# Patient Record
Sex: Female | Born: 1960 | Race: Black or African American | Hispanic: No | Marital: Married | Smoking: Former smoker
Health system: Southern US, Community
[De-identification: ages and names within clinical notes are randomized; demographics above are authoritative.]

## PROBLEM LIST (undated history)

## (undated) DIAGNOSIS — R002 Palpitations: Secondary | ICD-10-CM

## (undated) DIAGNOSIS — I10 Essential (primary) hypertension: Secondary | ICD-10-CM

## (undated) DIAGNOSIS — E785 Hyperlipidemia, unspecified: Secondary | ICD-10-CM

## (undated) DIAGNOSIS — R600 Localized edema: Secondary | ICD-10-CM

## (undated) DIAGNOSIS — M1990 Unspecified osteoarthritis, unspecified site: Secondary | ICD-10-CM

## (undated) DIAGNOSIS — R079 Chest pain, unspecified: Secondary | ICD-10-CM

## (undated) DIAGNOSIS — R12 Heartburn: Secondary | ICD-10-CM

## (undated) DIAGNOSIS — R7303 Prediabetes: Secondary | ICD-10-CM

## (undated) DIAGNOSIS — M549 Dorsalgia, unspecified: Secondary | ICD-10-CM

## (undated) DIAGNOSIS — R1310 Dysphagia, unspecified: Secondary | ICD-10-CM

## (undated) DIAGNOSIS — E559 Vitamin D deficiency, unspecified: Secondary | ICD-10-CM

## (undated) DIAGNOSIS — K829 Disease of gallbladder, unspecified: Secondary | ICD-10-CM

## (undated) DIAGNOSIS — R0602 Shortness of breath: Secondary | ICD-10-CM

## (undated) DIAGNOSIS — D649 Anemia, unspecified: Secondary | ICD-10-CM

## (undated) DIAGNOSIS — E669 Obesity, unspecified: Secondary | ICD-10-CM

## (undated) DIAGNOSIS — K219 Gastro-esophageal reflux disease without esophagitis: Secondary | ICD-10-CM

## (undated) DIAGNOSIS — E119 Type 2 diabetes mellitus without complications: Secondary | ICD-10-CM

## (undated) DIAGNOSIS — Z86718 Personal history of other venous thrombosis and embolism: Secondary | ICD-10-CM

## (undated) DIAGNOSIS — M7731 Calcaneal spur, right foot: Secondary | ICD-10-CM

## (undated) DIAGNOSIS — M109 Gout, unspecified: Secondary | ICD-10-CM

## (undated) HISTORY — DX: Gout, unspecified: M10.9

## (undated) HISTORY — DX: Type 2 diabetes mellitus without complications: E11.9

## (undated) HISTORY — DX: Hyperlipidemia, unspecified: E78.5

## (undated) HISTORY — DX: Heartburn: R12

## (undated) HISTORY — PX: ORIF TIBIA & FIBULA FRACTURES: SHX2131

## (undated) HISTORY — DX: Shortness of breath: R06.02

## (undated) HISTORY — DX: Calcaneal spur, right foot: M77.31

## (undated) HISTORY — PX: DENTAL SURGERY: SHX609

## (undated) HISTORY — DX: Prediabetes: R73.03

## (undated) HISTORY — DX: Personal history of other venous thrombosis and embolism: Z86.718

## (undated) HISTORY — DX: Gastro-esophageal reflux disease without esophagitis: K21.9

## (undated) HISTORY — DX: Disease of gallbladder, unspecified: K82.9

## (undated) HISTORY — DX: Dysphagia, unspecified: R13.10

## (undated) HISTORY — PX: ABDOMINAL HYSTERECTOMY: SHX81

## (undated) HISTORY — DX: Dorsalgia, unspecified: M54.9

## (undated) HISTORY — DX: Chest pain, unspecified: R07.9

## (undated) HISTORY — DX: Unspecified osteoarthritis, unspecified site: M19.90

## (undated) HISTORY — DX: Palpitations: R00.2

## (undated) HISTORY — DX: Vitamin D deficiency, unspecified: E55.9

## (undated) HISTORY — DX: Localized edema: R60.0

## (undated) HISTORY — PX: CHOLECYSTECTOMY: SHX55

---

## 1999-10-18 ENCOUNTER — Ambulatory Visit (HOSPITAL_COMMUNITY): Admission: RE | Admit: 1999-10-18 | Discharge: 1999-10-18 | Payer: Self-pay | Admitting: Family Medicine

## 1999-10-18 ENCOUNTER — Encounter: Payer: Self-pay | Admitting: Family Medicine

## 2000-11-04 ENCOUNTER — Encounter: Payer: Self-pay | Admitting: Emergency Medicine

## 2000-11-04 ENCOUNTER — Inpatient Hospital Stay (HOSPITAL_COMMUNITY): Admission: AD | Admit: 2000-11-04 | Discharge: 2000-11-04 | Payer: Self-pay | Admitting: Family Medicine

## 2000-12-18 ENCOUNTER — Encounter: Payer: Self-pay | Admitting: Family Medicine

## 2000-12-18 ENCOUNTER — Encounter: Admission: RE | Admit: 2000-12-18 | Discharge: 2000-12-18 | Payer: Self-pay | Admitting: Family Medicine

## 2001-04-20 ENCOUNTER — Encounter: Payer: Self-pay | Admitting: Family Medicine

## 2001-04-20 ENCOUNTER — Encounter: Admission: RE | Admit: 2001-04-20 | Discharge: 2001-04-20 | Payer: Self-pay | Admitting: Family Medicine

## 2001-06-25 ENCOUNTER — Ambulatory Visit (HOSPITAL_COMMUNITY): Admission: RE | Admit: 2001-06-25 | Discharge: 2001-06-25 | Payer: Self-pay | Admitting: Family Medicine

## 2001-06-25 ENCOUNTER — Encounter: Payer: Self-pay | Admitting: Family Medicine

## 2001-09-29 ENCOUNTER — Emergency Department (HOSPITAL_COMMUNITY): Admission: EM | Admit: 2001-09-29 | Discharge: 2001-09-29 | Payer: Self-pay | Admitting: Emergency Medicine

## 2001-10-02 ENCOUNTER — Emergency Department (HOSPITAL_COMMUNITY): Admission: EM | Admit: 2001-10-02 | Discharge: 2001-10-02 | Payer: Self-pay | Admitting: *Deleted

## 2003-05-01 ENCOUNTER — Emergency Department (HOSPITAL_COMMUNITY): Admission: EM | Admit: 2003-05-01 | Discharge: 2003-05-02 | Payer: Self-pay | Admitting: Emergency Medicine

## 2004-04-25 ENCOUNTER — Emergency Department (HOSPITAL_COMMUNITY): Admission: EM | Admit: 2004-04-25 | Discharge: 2004-04-25 | Payer: Self-pay | Admitting: Family Medicine

## 2004-08-22 ENCOUNTER — Emergency Department (HOSPITAL_COMMUNITY): Admission: EM | Admit: 2004-08-22 | Discharge: 2004-08-22 | Payer: Self-pay | Admitting: Emergency Medicine

## 2004-12-21 ENCOUNTER — Encounter: Admission: RE | Admit: 2004-12-21 | Discharge: 2004-12-21 | Payer: Self-pay | Admitting: Internal Medicine

## 2005-11-15 ENCOUNTER — Encounter: Admission: RE | Admit: 2005-11-15 | Discharge: 2005-11-15 | Payer: Self-pay | Admitting: Occupational Medicine

## 2007-11-30 ENCOUNTER — Emergency Department (HOSPITAL_COMMUNITY): Admission: EM | Admit: 2007-11-30 | Discharge: 2007-11-30 | Payer: Self-pay | Admitting: Family Medicine

## 2008-04-12 ENCOUNTER — Observation Stay (HOSPITAL_COMMUNITY): Admission: EM | Admit: 2008-04-12 | Discharge: 2008-04-14 | Payer: Self-pay | Admitting: Emergency Medicine

## 2009-10-15 ENCOUNTER — Emergency Department (HOSPITAL_COMMUNITY): Admission: EM | Admit: 2009-10-15 | Discharge: 2009-10-15 | Payer: Self-pay | Admitting: Emergency Medicine

## 2010-04-26 LAB — RAPID STREP SCREEN (MED CTR MEBANE ONLY): Streptococcus, Group A Screen (Direct): NEGATIVE

## 2010-05-24 LAB — CBC
Hemoglobin: 14.6 g/dL (ref 12.0–15.0)
RBC: 4.55 MIL/uL (ref 3.87–5.11)

## 2010-06-26 NOTE — Op Note (Signed)
Ruth [name], Ruth [name]                 ACCOUNT NO.:  [id number]   MEDICAL RECORD NO.:  [id number]          PATIENT TYPE:  OBV   LOCATION:  5003                         FACILITY:  [facility]   PHYSICIAN:  Harvie Junior, M.D.   DATE OF BIRTH:  14-Dec-1960   DATE OF PROCEDURE:  04/12/2008  DATE OF DISCHARGE:                               OPERATIVE REPORT   PREOPERATIVE DIAGNOSIS:  Fracture-dislocation of the left ankle, grade 1  open.   POSTOPERATIVE DIAGNOSIS:  Fracture-dislocation of the left ankle, grade  1 open.   PROCEDURES:  Open reduction and internal fixation of grade 1 bimalleolar  ankle fracture-dislocation with fixation of the lateral malleolus and  excisional debridement of open wound with excision of skin, subcutaneous  tissue, and bone and washout of the open ankle joint with a 6-hole one-  third tubular plate with interfragmentary screws.   SURGEON:  Harvie Junior, MD   ASSISTANT:  Marshia Ly, PA   ANESTHESIA:  General.   BRIEF HISTORY:  The patient is a 50 year old female who slipped earlier  in the evening and suffered an ankle fracture.  She was evaluated in the  emergency room, we were consulted for treatment of this ankle fracture.  At that time, her evaluation showed that she had a sort of an open  draining bleeding wound on the medial side.  X-ray showed a significant  lateral malleolar fracture and a small posterior malleolar fracture.  Certainly because of the grade 1 open nature, this injury needed to be  have an open reduction and internal fixation.  She was brought to the  operating room for this procedure.   PROCEDURES:  The patient was taken to the operating room.  After  adequate anesthesia was obtained with general anesthetic, the patient  was placed supine on the operating table.  The left leg was then prepped  and draped in the usual sterile fashion.  Following this, the leg was  exsanguinated and blood pressure tourniquet inflated to 350 mmHg.  Following this, the medial wound was opened and a liter of irrigation  was instilled through this opening or ellipsed open wound to wash out  the ankle joint.  Once this was completed, this was closed with  interrupted nylon suture.  Attention was then turned to the lateral side  where an incision was made in the subcutaneous tissues at the level of  the lateral malleolar fracture and the fracture was identified closed to  the healing elements and a manipulative closed reduction was undertaken.  It was held with a lockjaw clamp.  At this point, 2 interfragmentary  screws were placed back to front giving excellent fixation of the  fracture.  At this point, a 6-hole one-third plate was used and we got 2  great 4 cortical bites proximally and 2 grade cancellus bites distally  giving excellent fixation to the construct.  At this point, the wound  was copiously and thoroughly irrigated and suctioned dry.  The wound was  then closed in layers.  Once the wounds were closed, a sterile  compressive dressing was  applied as well as the posterior splint.  The  patient was taken to the recovery room and was noted to be in  satisfactory condition.  Estimated blood loss for the procedure was less  than 25 mL.      Harvie Junior, M.D.  Electronically Signed     JLG/MEDQ  D:  04/12/2008  T:  04/13/2008  Job:  [id number]

## 2010-06-29 NOTE — Discharge Summary (Signed)
Ruth, [name]                 ACCOUNT NO.:  [id number]   MEDICAL RECORD NO.:  [id number]          PATIENT TYPE:  OBV   LOCATION:  5003                         FACILITY:  [facility]   PHYSICIAN:  Harvie Junior, M.D.   DATE OF BIRTH:  1960/09/13   DATE OF ADMISSION:  04/12/2008  DATE OF DISCHARGE:  04/14/2008                               DISCHARGE SUMMARY   ADMITTING DIAGNOSES:  1. Open lateral malleolus fracture, left ankle.  2. History of tobacco dependence.   DISCHARGE DIAGNOSES:  1. Open lateral malleolus fracture, left ankle.  2. History of tobacco dependence.   PROCEDURES IN THE HOSPITAL:  Irrigation and debridement of left open  ankle fracture with open reduction and internal fixation of left ankle  fracture, Jodi Geralds, MD, April 12, 2008.   BRIEF HISTORY:  Ruth [name] is a 50 year old female who has a history of  stepping off a step and falling twisting her left ankle.  She had onset  of pain and swelling and inability to weight bear on her left ankle.  She came to [facility] Emergency Room where x-rays showed a  left ankle fracture, dislocation with a lateral malleolus fracture, and  she had an open 0.5 x 0.5 cm wound over the left ankle.  She had pain  with range of motion.  Neurovascular status is intact distally.  She was  brought into the hospital for I&D of her left ankle with open reduction  and internal fixation.   PERTINENT LABORATORY STUDIES:  Her hemoglobin on admission was 14.6,  hematocrit 42.6, WBC 9.6.  No other laboratory data was performed.   HOSPITAL COURSE:  The patient was admitted through the emergency room,  was given 2 g of Ancef in the emergency room, and was then brought to  the operating room where she underwent an emergent I&D of her left ankle  because of the open fracture aspect of it with open reduction and  internal fixation of the left ankle as well, described in Dr. Luiz Blare'  operative note on April 12, 2008.  Postoperatively, she  was admitted for  IV Ancef 1 g q.8 h. because of the risk of infection with the open ankle  fracture.  She was given IV fluids and was given IV narcotic pain  medication.  Physical therapy was ordered for crutch ambulation.  She is  to be nonweightbearing on the left.  On postop day #1, she was without  complaints.  She was taking fluids and voiding without difficulty.  She  was seen by Physical Therapy several times during the day and was very  groggy, and they did not feel she was safe to be discharged home.  Her  IV was discontinued, and she stayed in the hospital an additional night  because of her difficulty with physical therapy.  On April 14, 2008, she  was without complaints.  She was progressing with physical therapy in a  safe manner.  She is afebrile, and her vital signs were stable.  Her  left lower extremity splint was intact and NV was intact to the  toes .  Her IV was discontinued, and she was discharged home in improved  condition.  She will be on a regular diet.  Her activity status will be  nonweightbearing on the left with crutches.  She will elevate her leg as  much as possible.  She was given Rx Percocet 5 mg p.o. for pain and  Keflex 500 mg t.i.d. x7 days prophylactically.  She will follow up in  the office in 10 days, call sooner if any problems.      Marshia Ly, P.A.      Harvie Junior, M.D.  Electronically Signed    JB/MEDQ  D:  06/15/2008  T:  06/16/2008  Job:  [id number]

## 2011-04-22 ENCOUNTER — Encounter (HOSPITAL_COMMUNITY): Payer: Self-pay | Admitting: Emergency Medicine

## 2011-04-22 ENCOUNTER — Emergency Department (HOSPITAL_COMMUNITY)
Admission: EM | Admit: 2011-04-22 | Discharge: 2011-04-22 | Disposition: A | Discharge status: Home / Self Care | Payer: Self-pay | Attending: Emergency Medicine | Admitting: Emergency Medicine

## 2011-04-22 DIAGNOSIS — F17200 Nicotine dependence, unspecified, uncomplicated: Secondary | ICD-10-CM | POA: Insufficient documentation

## 2011-04-22 DIAGNOSIS — L0211 Cutaneous abscess of neck: Secondary | ICD-10-CM | POA: Insufficient documentation

## 2011-04-22 DIAGNOSIS — L0291 Cutaneous abscess, unspecified: Secondary | ICD-10-CM

## 2011-04-22 HISTORY — DX: Obesity, unspecified: E66.9

## 2011-04-22 MED ORDER — HYDROCODONE-ACETAMINOPHEN 5-325 MG PO TABS: 1.0000 | ORAL_TABLET | ORAL | Status: AC | PRN

## 2011-04-22 MED ORDER — IBUPROFEN 800 MG PO TABS
800.0000 mg | ORAL_TABLET | Freq: Once | ORAL | Status: AC
  Administered 2011-04-22: 800 mg via ORAL
  Filled 2011-04-22: qty 1

## 2011-04-22 MED ORDER — LIDOCAINE-EPINEPHRINE 2 %-1:100000 IJ SOLN
20.0000 mL | Freq: Once | INTRAMUSCULAR | Status: AC
  Administered 2011-04-22: 20 mL via INTRADERMAL

## 2011-04-22 NOTE — ED Provider Notes (Signed)
History     CSN: [id number]  Arrival date & time 04/22/11  0801   First MD Initiated Contact with Patient 04/22/11 [phone]      Chief Complaint  Patient presents with  . Abscess    1.5 cm diameter swollen area omn l/side of neck x 1 week    (Consider location/radiation/quality/duration/timing/severity/associated sxs/prior treatment) Patient is a 51 y.o. female presenting with abscess. The history is provided by the patient.  Abscess  This is a new problem. The current episode started less than one week ago. The onset was gradual. The problem has been gradually worsening. The abscess is present on the neck. The problem is moderate. Pertinent negatives include no fever. Associated symptoms comments: No complaint of drainage from painful swollen area of posterior neck. No fever. She reports one episode of similar symptoms a long time ago that did not require I&D.Marland Kitchen    Past Medical History  Diagnosis Date  . Obesity     Past Surgical History  Procedure Date  . Leg surgery   . Abdominal hysterectomy     Family History  Problem Relation Age of Onset  . Diabetes Mother   . Diabetes Other   . Hypertension Other     History  Substance Use Topics  . Smoking status: Current Everyday Smoker    Types: Cigarettes  . Smokeless tobacco: Not on file  . Alcohol Use: Yes    OB History    Grav Para Term Preterm Abortions TAB SAB Ect Mult Living                  Review of Systems  Constitutional: Negative for fever and chills.  HENT: Negative.   Respiratory: Negative.   Cardiovascular: Negative.   Gastrointestinal: Negative.  Negative for nausea.  Musculoskeletal: Negative.   Skin:       C/O Abscess.  Neurological: Negative.     Allergies  Review of patient's allergies indicates no known allergies.  Home Medications  No current outpatient prescriptions on file.  BP 137/72  Pulse 73  Temp(Src) 98.1 F (36.7 C) (Oral)  Resp 18  SpO2 99%  Physical Exam    Constitutional: She is oriented to person, place, and time. She appears well-developed and well-nourished.  Neck: Normal range of motion.  Pulmonary/Chest: Effort normal.  Neurological: She is alert and oriented to person, place, and time.  Skin: Skin is warm and dry.       Approximate 3 cm circular tender swelling to posterior left neck c/w abscess. No drainage.     ED Course  Procedures (including critical care time) INCISION AND DRAINAGE Performed by: Langley Adie A Consent: Verbal consent obtained. Risks and benefits: risks, benefits and alternatives were discussed Type: abscess  Body area: posterior left neck  Anesthesia: local infiltration  Local anesthetic: lidocaine 2% w/ epinephrine  Anesthetic total: 1 ml  Complexity: complex Blunt dissection to break up loculations  Drainage: purulent  Drainage amount: large, sebaceous  Packing material: 1/4 in iodoform gauze  Patient tolerance: Patient tolerated the procedure well with no immediate complications.    Labs Reviewed - No data to display No results found.   No diagnosis found.    MDM          Rodena Medin, PA-C 04/22/11 [id number]  Rodena Medin, PA-C 04/28/11 1524

## 2011-04-22 NOTE — ED Notes (Signed)
9:53 departure condition on wrong pt

## 2011-04-22 NOTE — Discharge Instructions (Signed)
RETURN HERE IN 2 DAYS FOR PACKING REMOVAL, SOONER WITH ANY SIGN OF INFECTION - FEVER, INCREASING PAIN OR DRAINAGE. [facility] FOR PAIN AS NEEDED.   Abscess An abscess (boil or furuncle) is an infected area that contains a collection of pus.  SYMPTOMS Signs and symptoms of an abscess include pain, tenderness, redness, or hardness. You may feel a moveable soft area under your skin. An abscess can occur anywhere in the body.  TREATMENT  A surgical cut (incision) may be made over your abscess to drain the pus. Gauze may be packed into the space or a drain may be looped through the abscess cavity (pocket). This provides a drain that will allow the cavity to heal from the inside outwards. The abscess may be painful for a few days, but should feel much better if it was drained.  Your abscess, if seen early, may not have localized and may not have been drained. If not, another appointment may be required if it does not get better on its own or with medications. HOME CARE INSTRUCTIONS   Only take over-the-counter or prescription medicines for pain, discomfort, or fever as directed by your caregiver.   Take your antibiotics as directed if they were prescribed. Finish them even if you start to feel better.   Keep the skin and clothes clean around your abscess.   If the abscess was drained, you will need to use gauze dressing to collect any draining pus. Dressings will typically need to be changed 3 or more times a day.   The infection may spread by skin contact with others. Avoid skin contact as much as possible.   Practice good hygiene. This includes regular hand washing, cover any draining skin lesions, and do not share personal care items.   If you participate in sports, do not share athletic equipment, towels, whirlpools, or personal care items. Shower after every practice or tournament.   If a draining area cannot be adequately covered:   Do not participate in sports.   Children should not  participate in day care until the wound has healed or drainage stops.   If your caregiver has given you a follow-up appointment, it is very important to keep that appointment. Not keeping the appointment could result in a much worse infection, chronic or permanent injury, pain, and disability. If there is any problem keeping the appointment, you must call back to this facility for assistance.  SEEK MEDICAL CARE IF:   You develop increased pain, swelling, redness, drainage, or bleeding in the wound site.   You develop signs of generalized infection including muscle aches, chills, fever, or a general ill feeling.   You have an oral temperature above 102 F (38.9 C).  MAKE SURE YOU:   Understand these instructions.   Will watch your condition.   Will get help right away if you are not doing well or get worse.  Document Released: 11/07/2004 Document Revised: 01/17/2011 Document Reviewed: 09/01/2007 [facility] Patient Information 2012 [place], [place].

## 2011-04-25 ENCOUNTER — Emergency Department (HOSPITAL_COMMUNITY)
Admission: EM | Admit: 2011-04-25 | Discharge: 2011-04-25 | Disposition: A | Discharge status: Home Health | Payer: Self-pay | Attending: Emergency Medicine | Admitting: Emergency Medicine

## 2011-04-25 ENCOUNTER — Encounter (HOSPITAL_COMMUNITY): Payer: Self-pay

## 2011-04-25 DIAGNOSIS — Z4801 Encounter for change or removal of surgical wound dressing: Secondary | ICD-10-CM | POA: Insufficient documentation

## 2011-04-25 DIAGNOSIS — E669 Obesity, unspecified: Secondary | ICD-10-CM | POA: Insufficient documentation

## 2011-04-25 DIAGNOSIS — F17200 Nicotine dependence, unspecified, uncomplicated: Secondary | ICD-10-CM | POA: Insufficient documentation

## 2011-04-25 DIAGNOSIS — IMO0001 Reserved for inherently not codable concepts without codable children: Secondary | ICD-10-CM

## 2011-04-25 NOTE — ED Provider Notes (Signed)
History     CSN: [id number]  Arrival date & time 04/25/11  1191   First MD Initiated Contact with Patient 04/25/11 0732      Chief Complaint  Patient presents with  . Wound Check    follow up from aabscess packing    (Consider location/radiation/quality/duration/timing/severity/associated sxs/prior treatment) Patient is a 51 y.o. female presenting with wound check. The history is provided by the patient.  Wound Check  She was treated in the ED 2 to 3 days ago. Previous treatment in the ED includes I&D of abscess ((sebaceous cyst)). There has been no treatment since the wound repair. There has been no drainage from the wound. There is no redness present. There is no swelling present. The pain has improved.  Pt states she is doing well and feeling much better.  Past Medical History  Diagnosis Date  . Obesity     Past Surgical History  Procedure Date  . Leg surgery   . Abdominal hysterectomy     Family History  Problem Relation Age of Onset  . Diabetes Mother   . Diabetes Other   . Hypertension Other     History  Substance Use Topics  . Smoking status: Current Everyday Smoker    Types: Cigarettes  . Smokeless tobacco: Not on file  . Alcohol Use: No    OB History    Grav Para Term Preterm Abortions TAB SAB Ect Mult Living                  Review of Systems  Constitutional: Negative for fever.    Allergies  Review of patient's allergies indicates no known allergies.  Home Medications   Current Outpatient Rx  Name Route Sig Dispense Refill  . HYDROCODONE-ACETAMINOPHEN 5-325 MG PO TABS Oral Take 1 tablet by mouth every 4 (four) hours as needed for pain. 10 tablet 0    BP 182/107  Pulse 81  Temp(Src) 98.4 F (36.9 C) (Oral)  Resp 18  SpO2 100%  Physical Exam  Nursing note and vitals reviewed. Constitutional: She appears well-developed and well-nourished. No distress.  HENT:  Head: Normocephalic and atraumatic.  Right Ear: External ear normal.    Left Ear: External ear normal.  Eyes: Conjunctivae are normal. Right eye exhibits no discharge. Left eye exhibits no discharge. No scleral icterus.  Neck: Neck supple. No tracheal deviation present.       Small <1 cm wound posterior left neck, packing removed, no e/e  Cardiovascular: Normal rate.   Pulmonary/Chest: Effort normal. No stridor. No respiratory distress.  Musculoskeletal: She exhibits no edema.  Neurological: She is alert. Cranial nerve deficit: no gross deficits.  Skin: Skin is warm and dry. No rash noted.  Psychiatric: She has a normal mood and affect.    ED Course  Procedures (including critical care time)  Labs Reviewed - No data to display No results found.   1. Wound check, dressing change       MDM  Packing removed, tolerated well, note indicates it was a sebaceous cyst.  No sign of active infection.  abx ointment and dressing placed        Celene Kras, MD 04/25/11 [phone]

## 2011-04-25 NOTE — Discharge Instructions (Signed)
Dressing Change   Dressings are placed over wounds to keep them clean, dry, and protected from further injury. This provides an environment that favors wound healing. Good wound care includes resting and elevating the injured part until the pain and swelling are better. Change your wound dressing as recommended by your caregiver.   When removing an old dressing, lift it slowly away from the wound. If the dressing sticks to the wound, dampen it with half-strength peroxide or tap water. Clean the wound gently with a moist cloth, remove any loose material, and apply antibiotic ointment if recommended by your caregiver. Usually it is okay for a wound to get wet. Wash it with mildly soapy water. Watch for signs of infection when changing a dressing.   SEEK MEDICAL CARE IF:   You develop increased pain, redness, or swelling.   You have pus-like drainage from the wound.   You develop a fever greater than 100.4 F (38 C).   Document Released: 03/07/2004 Document Revised: 01/17/2011 Document Reviewed: 12/10/2010   [employer] Patient Information 2012 [employer].

## 2011-04-25 NOTE — ED Notes (Signed)
Pt states here for follow up from abscess packing on Monday, denies draining or pain.

## 2011-05-09 NOTE — ED Provider Notes (Signed)
Evaluation and management procedures were performed by the PA/NP under my supervision/collaboration.   Daire Okimoto D Laiya Wisby, MD 05/09/11 1010 

## 2011-12-01 ENCOUNTER — Emergency Department (HOSPITAL_COMMUNITY): Payer: No Typology Code available for payment source

## 2011-12-01 ENCOUNTER — Encounter (HOSPITAL_COMMUNITY): Payer: Self-pay | Admitting: Emergency Medicine

## 2011-12-01 ENCOUNTER — Emergency Department (HOSPITAL_COMMUNITY)
Admission: EM | Admit: 2011-12-01 | Discharge: 2011-12-01 | Disposition: A | Discharge status: Home / Self Care | Payer: No Typology Code available for payment source | Attending: Emergency Medicine | Admitting: Emergency Medicine

## 2011-12-01 DIAGNOSIS — IMO0002 Reserved for concepts with insufficient information to code with codable children: Secondary | ICD-10-CM | POA: Insufficient documentation

## 2011-12-01 DIAGNOSIS — R079 Chest pain, unspecified: Secondary | ICD-10-CM | POA: Insufficient documentation

## 2011-12-01 DIAGNOSIS — S46911A Strain of unspecified muscle, fascia and tendon at shoulder and upper arm level, right arm, initial encounter: Secondary | ICD-10-CM

## 2011-12-01 DIAGNOSIS — S161XXA Strain of muscle, fascia and tendon at neck level, initial encounter: Secondary | ICD-10-CM

## 2011-12-01 DIAGNOSIS — M25529 Pain in unspecified elbow: Secondary | ICD-10-CM | POA: Insufficient documentation

## 2011-12-01 DIAGNOSIS — R209 Unspecified disturbances of skin sensation: Secondary | ICD-10-CM | POA: Insufficient documentation

## 2011-12-01 DIAGNOSIS — S139XXA Sprain of joints and ligaments of unspecified parts of neck, initial encounter: Secondary | ICD-10-CM | POA: Insufficient documentation

## 2011-12-01 DIAGNOSIS — M542 Cervicalgia: Secondary | ICD-10-CM | POA: Insufficient documentation

## 2011-12-01 HISTORY — DX: Essential (primary) hypertension: I10

## 2011-12-01 MED ORDER — OXYCODONE-ACETAMINOPHEN 5-325 MG PO TABS
1.0000 | ORAL_TABLET | Freq: Once | ORAL | Status: AC
  Administered 2011-12-01: 1 via ORAL
  Filled 2011-12-01: qty 1

## 2011-12-01 MED ORDER — OXYCODONE-ACETAMINOPHEN 5-325 MG PO TABS: 1.0000 | ORAL_TABLET | ORAL | Status: DC | PRN
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
## 2011-12-01 NOTE — ED Notes (Signed)
Pt refused D/C vitals. States that "Im not here for my blood pressure"
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
## 2013-03-16 ENCOUNTER — Other Ambulatory Visit: Payer: Self-pay | Admitting: Internal Medicine

## 2013-03-16 DIAGNOSIS — Z1231 Encounter for screening mammogram for malignant neoplasm of breast: Secondary | ICD-10-CM

## 2013-03-31 ENCOUNTER — Ambulatory Visit: Payer: Self-pay

## 2013-04-08 ENCOUNTER — Ambulatory Visit: Payer: Self-pay

## 2013-04-19 ENCOUNTER — Inpatient Hospital Stay: Admission: RE | Admit: 2013-04-19 | Payer: Self-pay | Source: Ambulatory Visit

## 2014-01-20 ENCOUNTER — Emergency Department (HOSPITAL_COMMUNITY)
Admission: EM | Admit: 2014-01-20 | Discharge: 2014-01-20 | Disposition: A | Discharge status: Home / Self Care | Payer: No Typology Code available for payment source | Attending: Emergency Medicine | Admitting: Emergency Medicine

## 2014-01-20 ENCOUNTER — Encounter (HOSPITAL_COMMUNITY): Payer: Self-pay | Admitting: Emergency Medicine

## 2014-01-20 DIAGNOSIS — S161XXA Strain of muscle, fascia and tendon at neck level, initial encounter: Secondary | ICD-10-CM

## 2014-01-20 DIAGNOSIS — E669 Obesity, unspecified: Secondary | ICD-10-CM | POA: Insufficient documentation

## 2014-01-20 DIAGNOSIS — Y998 Other external cause status: Secondary | ICD-10-CM | POA: Insufficient documentation

## 2014-01-20 DIAGNOSIS — Y92410 Unspecified street and highway as the place of occurrence of the external cause: Secondary | ICD-10-CM | POA: Insufficient documentation

## 2014-01-20 DIAGNOSIS — Y9389 Activity, other specified: Secondary | ICD-10-CM | POA: Insufficient documentation

## 2014-01-20 DIAGNOSIS — I10 Essential (primary) hypertension: Secondary | ICD-10-CM | POA: Insufficient documentation

## 2014-01-20 DIAGNOSIS — Z87891 Personal history of nicotine dependence: Secondary | ICD-10-CM | POA: Insufficient documentation

## 2014-01-20 MED ORDER — CYCLOBENZAPRINE HCL 10 MG PO TABS: 10.0000 mg | ORAL_TABLET | Freq: Two times a day (BID) | ORAL | Status: DC | PRN

## 2014-01-20 MED ORDER — NAPROXEN 500 MG PO TABS
500.0000 mg | ORAL_TABLET | Freq: Once | ORAL | Status: AC
  Administered 2014-01-20: 500 mg via ORAL
  Filled 2014-01-20: qty 1

## 2014-01-20 MED ORDER — HYDROCODONE-ACETAMINOPHEN 5-325 MG PO TABS: 1.0000 | ORAL_TABLET | ORAL | Status: DC | PRN

## 2014-01-20 MED ORDER — NAPROXEN 500 MG PO TABS: 500.0000 mg | ORAL_TABLET | Freq: Two times a day (BID) | ORAL | Status: DC

## 2014-01-20 MED ORDER — HYDROCODONE-ACETAMINOPHEN 5-325 MG PO TABS
1.0000 | ORAL_TABLET | Freq: Once | ORAL | Status: AC
Start: 2014-01-20 — End: 2014-01-20
  Administered 2014-01-20: 1 via ORAL
  Filled 2014-01-20: qty 1
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
## 2014-01-20 NOTE — Discharge Instructions (Signed)
Please follow directions provided. Take tomorrow to rest use ice and/or heat to your neck and shoulder muscles to help with stiffness. Please take the naproxen twice a day for inflammation. Please take the Flexeril twice a day as needed for muscle tightness. You may take the Vicodin as needed for pain not relieved by the first 2 medicines. Please take with caution however as it may make you sleepy. Be sure to follow-up with the primary care doctor listed if your symptoms worsen or do not get better. Don't hesitate to return for any new, worsening, or concerning symptoms.   SEEK IMMEDIATE MEDICAL CARE IF:  You develop any bleeding.  You develop stomach upset.  You have signs of an allergic reaction to your medicine.  Your symptoms get worse.  You develop new, unexplained symptoms.  You have numbness, tingling, weakness, or paralysis in any part of your body.    Emergency Department Resource Guide 1) Find a Doctor and Pay Out of Pocket Although you won't have to find out who is covered by your insurance plan, it is a good idea to ask around and get recommendations. You will then need to call the office and see if the doctor you have chosen will accept you as a new patient and what types of options they offer for patients who are self-pay. Some doctors offer discounts or will set up payment plans for their patients who do not have insurance, but you will need to ask so you aren't surprised when you get to your appointment.  2) Contact Your Local Health Department Not all health departments have doctors that can see patients for sick visits, but many do, so it is worth a call to see if yours does. If you don't know where your local health department is, you can check in your phone book. The CDC also has a tool to help you locate your state's health department, and many state websites also have listings of all of their local health departments.  3) Find a [facility] If your illness is not likely  to be very severe or complicated, you may want to try a walk in clinic. These are popping up all over the country in pharmacies, drugstores, and shopping centers. They're usually staffed by nurse practitioners or physician assistants that have been trained to treat common illnesses and complaints. They're usually fairly quick and inexpensive. However, if you have serious medical issues or chronic medical problems, these are probably not your best option.  No Primary Care Doctor: - Call Health Connect at  [phone] - they can help you locate a primary care doctor that  accepts your insurance, provides certain services, etc. - Physician Referral Service- [phone]  Chronic Pain Problems: Organization         Address  Phone   Notes  [facility]  [phone] Patients need to be referred by their primary care doctor.   Medication Assistance: Organization         Address  Phone   Notes  [facility] Medication [facility] [address] [place], [place] [address] [phone] --Must be a resident of [facility] -- Must have NO insurance coverage whatsoever (no Medicaid/ Medicare, etc.) -- The pt. MUST have a primary care doctor that directs their care regularly and follows them in the community   [facility]ist  [phone]   [employer]  [phone]    Agencies that provide inexpensive medical care: Organization  Address  Phone   Notes  [place]  [phone]   [facility]    [phone]   [facility] [place], [place] [address] [phone]   [place] [address] [place], [place] [phone]   Planned Parenthood    [phone]   [facility]    [phone]   [place] and [address], [place] Phone:  [phone], Fax:  [phone] Hours of Operation:  9 am - 6 pm, M-F.   Also accepts Medicaid/Medicare and self-pay.  [facility] for [place] [address], [address], [place] Phone: [phone], Fax: [phone]. Hours of Operation:  8:30 am - 5:30 pm, M-F.  Also accepts Medicaid and self-pay.  [facility] [place] [address], [place] Phone: [phone]   [place], [place], [place] [phone], Ext. [phone] Mondays & Thursdays: 7-9 AM.  First 15 patients are seen on a first come, first serve basis.    [place] Providers:  Organization         Address  Phone   Notes  [facility] [address], [place] [phone] Also accepts self-pay patients.  [facility] [address], [place]  [phone]   [place], [address], [place] [phone]   [facility] [address] [place] [phone]   Lucianne Lei [address], [place]   [phone] Only accepts [place] Access [place] patients after they have their name applied to their card.   Self-Pay (no insurance) in [facility]:  Organization         Address  Phone   Notes  Sickle Cell Patients, [facility] Internal Medicine [place] [phone]   [facility] Urgent Care [place] [phone]   [facility]  [place], [place], [place] [phone]   [facility]/Dr. Osei-Bonsu  [address], [place] or [address], [place] [phone] Phone number for both [place] and [place] locations is the same.  Urgent Medical and [facility] [address], [place] [phone]   [facility] [address] [place] or [address] [phone]   [facility] [address],  [place] [phone], phone; [phone], fax Sees patients 1st and 3rd Saturday of every month.  Must not qualify for public or private insurance (i.e. Medicaid, Medicare, [place] Health Choice, Veterans' Benefits)  Household income should be no more than 200% of the poverty level The clinic cannot treat you if you are pregnant or think you are pregnant  Sexually transmitted diseases are not treated at the clinic.    Dental Care: Organization         Address  Phone  Notes  [facility] Department of [facility] [phone] Accepts children up to age 79 who are enrolled in [place] or [place]; pregnant women with a Medicaid card; and children who have applied for Medicaid or [place] Health Choice, but were declined, whose parents can pay a reduced fee at time  of service.  [facility] Department of [facility]  [address], [place] [phone] Accepts children up to age 84 who are enrolled in [place] or [place]; pregnant women with a Medicaid card; and children who have applied for Medicaid or [place] Health Choice, but were declined, whose parents can pay a reduced fee at time of service.  [place] Adult Dental Access PROGRAM  [place] [phone] Patients are seen by appointment only. Walk-ins are not accepted. [place] will see patients 65 years of age and older. Monday - Tuesday (8am-5pm) Most Wednesdays (8:30-5pm) $30 per visit, cash only  [facility] Adult Dental Access PROGRAM  [address], [facility] [phone] Patients are seen by appointment only. Walk-ins are not accepted. [place] will see patients 57 years of age and older. One Wednesday Evening (Monthly: Volunteer Based).  $30 per visit, cash only  [place]  [phone] for adults; Children under age 85, call Graduate Pediatric Dentistry at [phone].  Children aged 75-14, please call [phone] to request a pediatric application.  Dental services are provided in all areas of dental care including fillings, crowns and bridges, complete and partial dentures, implants, gum treatment, root canals, and extractions. Preventive care is also provided. Treatment is provided to both adults and children. Patients are selected via a lottery and there is often a waiting list.   [facility] [address] [place]  [phone] [email or web address]   [facility] [address] [place], [place] [phone], Ext. [phone] Second and Fourth Thursday of each month, opens at 6:30 AM; Clinic ends at 9 AM.  Patients are seen on a first-come first-served basis, and a limited number are seen during each clinic.   [facility]  [address] Hillard Danker [place], [place] [phone]   Eligibility Requirements You must have lived in [place], [place], or [place] counties for at least the last three months.   You cannot be eligible for state or federal sponsored [employer], including [employer], [place], or [employer].   You generally cannot be eligible for healthcare insurance through your employer.    How to apply: Eligibility screenings are held every Tuesday and Wednesday afternoon from 1:00 pm until 4:00 pm. You do not need an appointment for the interview!  [facility] [address] [place], [place]   [place]  [place] Department  [place]  [phone]    Behavioral Health Resources in the Community: Intensive Outpatient Programs Organization         Address  Phone  Notes  [place]. [address], [place], [place] [phone]   [facility] Outpatient [address], [place], [place]   ADS: Alcohol & Drug Svcs [address], [place], [place]   [place] [address]  [place], [place] or [phone]   Substance Abuse Resources Organization         Address  Phone  Notes  Alcohol and Drug Services  [phone]   [place]  [phone]   [facility] [place]   Chinita Pester  [phone]   Residential & Outpatient Substance Abuse Program  [phone]   Psychological Services Organization         Address  Phone  Notes  [facility]  [place]  [phone]   [place] [address], [place] or [phone]    Mobile Crisis Teams Organization         Address  Phone  Notes  [facility], Mobile Crisis Care Unit  [phone]   [facility]  [address] [place], [place]   [place] [address] [place] [phone]    Self-Help/Support Groups Organization         Address  Phone             Notes  [place]. of [place] - variety of support groups  [place] Call for more information  Narcotics Anonymous (NA), [facility] [address], [employer] [place]  2 meetings at this location   Special educational needs teacher         Address  Phone  Notes  [facility]idential Treatment [place],    [place]  [phone]   [facility]  [address] [place] [phone], [place], [place]   [place], [place] [phone] Admissions: 8am-3pm M-F  Incentives Substance [place] [address],    Eugenio Saenz, [place] [phone]   [facility] [address] [place], [place], [place]   [facility] [address],  [place], [place]   [facility] Programs - Intensive Outpatient [address] Kristeen [address], [place], [place]     [facility] ([place].,  [place], [place] [phone] or [phone]   [facility] Services (RTS) [address], [place], [place] Accepts Medicaid  [facility] [place] [address],  [place] [phone] Substance Abuse/Addiction Treatment   [facility] Organization         Address  Phone  Notes  [facility]  [phone]   Domenic Schwab, PhD [address] Arlis Porta [place], [place]   [phone] or [phone]   [place], [place] [phone]   [facility] [address], [place], [place] [phone] Insurance/Medicaid/sponsorship through [facility] and Families [address] [place]                                    [place], [place] [phone] [place] [place] [address], [place] [phone]    Dr. Adele Schilder  [phone]   [facility] Dept. 1) [address], [place] 2) [place] 3)  [place] [address], [place] [phone]  [phone]   [place] [phone] or [phone] (After Hours)     '
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
## 2015-04-27 LAB — HEMOGLOBIN A1C: Hemoglobin A1C: 8.7

## 2015-04-27 LAB — HEPATIC FUNCTION PANEL
ALK PHOS: 67 U/L (ref 25–125)
ALT: 17 U/L (ref 7–35)
AST: 15 U/L (ref 13–35)
BILIRUBIN, TOTAL: 0.4 mg/dL

## 2015-04-27 LAB — BASIC METABOLIC PANEL
BUN: 14 mg/dL (ref 4–21)
CREATININE: 0.8 mg/dL (ref 0.5–1.1)
Glucose: 247 mg/dL
POTASSIUM: 3.9 mmol/L (ref 3.4–5.3)
Sodium: 139 mmol/L (ref 137–147)

## 2015-06-19 LAB — HM DIABETES EYE EXAM

## 2015-07-20 LAB — BASIC METABOLIC PANEL
BUN: 12 mg/dL (ref 4–21)
Creatinine: 0.7 mg/dL (ref 0.5–1.1)
Glucose: 183 mg/dL
Potassium: 4.2 mmol/L (ref 3.4–5.3)
Sodium: 142 mmol/L (ref 137–147)

## 2015-07-20 LAB — HEPATIC FUNCTION PANEL
ALK PHOS: 80 U/L (ref 25–125)
ALT: 20 U/L (ref 7–35)
AST: 18 U/L (ref 13–35)
Bilirubin, Total: 0.3 mg/dL

## 2015-07-20 LAB — LIPID PANEL
Cholesterol: 203 mg/dL — AB (ref 0–200)
HDL: 54 mg/dL (ref 35–70)
LDL Cholesterol: 131 mg/dL
Triglycerides: 88 mg/dL (ref 40–160)

## 2015-07-20 LAB — HEMOGLOBIN A1C: Hemoglobin A1C: 7.6

## 2016-03-28 ENCOUNTER — Other Ambulatory Visit (HOSPITAL_COMMUNITY): Payer: Self-pay | Admitting: Internal Medicine

## 2016-03-28 ENCOUNTER — Ambulatory Visit (HOSPITAL_COMMUNITY)
Admission: RE | Admit: 2016-03-28 | Discharge: 2016-03-28 | Disposition: A | Discharge status: Home / Self Care | Payer: 59 | Source: Ambulatory Visit | Attending: Internal Medicine | Admitting: Internal Medicine

## 2016-03-28 DIAGNOSIS — M25561 Pain in right knee: Secondary | ICD-10-CM | POA: Diagnosis not present

## 2016-04-29 ENCOUNTER — Ambulatory Visit (INDEPENDENT_AMBULATORY_CARE_PROVIDER_SITE_OTHER): Payer: 59 | Admitting: Nurse Practitioner

## 2016-04-29 ENCOUNTER — Encounter: Payer: Self-pay | Admitting: Nurse Practitioner

## 2016-04-29 VITALS — BP 122/88 | HR 86 | Temp 98.6°F | Ht 65.0 in | Wt 337.0 lb

## 2016-04-29 DIAGNOSIS — I10 Essential (primary) hypertension: Secondary | ICD-10-CM | POA: Diagnosis not present

## 2016-04-29 DIAGNOSIS — E118 Type 2 diabetes mellitus with unspecified complications: Secondary | ICD-10-CM

## 2016-04-29 DIAGNOSIS — Z0000 Encounter for general adult medical examination without abnormal findings: Secondary | ICD-10-CM | POA: Diagnosis not present

## 2016-04-29 DIAGNOSIS — E119 Type 2 diabetes mellitus without complications: Secondary | ICD-10-CM | POA: Insufficient documentation

## 2016-04-29 DIAGNOSIS — Z6841 Body Mass Index (BMI) 40.0 and over, adult: Secondary | ICD-10-CM | POA: Diagnosis not present

## 2016-04-29 DIAGNOSIS — Z1211 Encounter for screening for malignant neoplasm of colon: Secondary | ICD-10-CM | POA: Diagnosis not present

## 2016-04-29 DIAGNOSIS — E1039 Type 1 diabetes mellitus with other diabetic ophthalmic complication: Secondary | ICD-10-CM

## 2016-04-29 DIAGNOSIS — E1169 Type 2 diabetes mellitus with other specified complication: Secondary | ICD-10-CM | POA: Insufficient documentation

## 2016-04-29 DIAGNOSIS — E785 Hyperlipidemia, unspecified: Secondary | ICD-10-CM | POA: Diagnosis not present

## 2016-04-29 NOTE — Patient Instructions (Addendum)
Sign medical release form to get records from Dr. Antonietta Jewel.  DASH Eating Plan DASH stands for "Dietary Approaches to Stop Hypertension." The DASH eating plan is a healthy eating plan that has been shown to reduce high blood pressure (hypertension). It may also reduce your risk for type 2 diabetes, heart disease, and stroke. The DASH eating plan may also help with weight loss. What are tips for following this plan? General guidelines   Avoid eating more than 2,300 mg (milligrams) of salt (sodium) a day. If you have hypertension, you may need to reduce your sodium intake to 1,500 mg a day.  Limit alcohol intake to no more than 1 drink a day for nonpregnant women and 2 drinks a day for men. One drink equals 12 oz of beer, 5 oz of wine, or 1 oz of hard liquor.  Work with your health care provider to maintain a healthy body weight or to lose weight. Ask what an ideal weight is for you.  Get at least 30 minutes of exercise that causes your heart to beat faster (aerobic exercise) most days of the week. Activities may include walking, swimming, or biking.  Work with your health care provider or diet and nutrition specialist (dietitian) to adjust your eating plan to your individual calorie needs. Reading food labels   Check food labels for the amount of sodium per serving. Choose foods with less than 5 percent of the Daily Value of sodium. Generally, foods with less than 300 mg of sodium per serving fit into this eating plan.  To find whole grains, look for the word "whole" as the first word in the ingredient list. Shopping   Buy products labeled as "low-sodium" or "no salt added."  Buy fresh foods. Avoid canned foods and premade or frozen meals. Cooking   Avoid adding salt when cooking. Use salt-free seasonings or herbs instead of table salt or sea salt. Check with your health care provider or pharmacist before using salt substitutes.  Do not fry foods. Cook foods using healthy methods such  as baking, boiling, grilling, and broiling instead.  Cook with heart-healthy oils, such as olive, canola, soybean, or sunflower oil. Meal planning    Eat a balanced diet that includes:  5 or more servings of fruits and vegetables each day. At each meal, try to fill half of your plate with fruits and vegetables.  Up to 6-8 servings of whole grains each day.  Less than 6 oz of lean meat, poultry, or fish each day. A 3-oz serving of meat is about the same size as a deck of cards. One egg equals 1 oz.  2 servings of low-fat dairy each day.  A serving of nuts, seeds, or beans 5 times each week.  Heart-healthy fats. Healthy fats called Omega-3 fatty acids are found in foods such as flaxseeds and coldwater fish, like sardines, salmon, and mackerel.  Limit how much you eat of the following:  Canned or prepackaged foods.  Food that is high in trans fat, such as fried foods.  Food that is high in saturated fat, such as fatty meat.  Sweets, desserts, sugary drinks, and other foods with added sugar.  Full-fat dairy products.  Do not salt foods before eating.  Try to eat at least 2 vegetarian meals each week.  Eat more home-cooked food and less restaurant, buffet, and fast food.  When eating at a restaurant, ask that your food be prepared with less salt or no salt, if possible. What foods  are recommended? The items listed may not be a complete list. Talk with your dietitian about what dietary choices are best for you. Grains  Whole-grain or whole-wheat bread. Whole-grain or whole-wheat pasta. Brown rice. [place]. Bulgur. Whole-grain and low-sodium cereals. Pita bread. Low-fat, low-sodium crackers. Whole-wheat flour tortillas. Vegetables  Fresh or frozen vegetables (raw, steamed, roasted, or grilled). Low-sodium or reduced-sodium tomato and vegetable juice. Low-sodium or reduced-sodium tomato sauce and tomato paste. Low-sodium or reduced-sodium canned vegetables. Fruits  All  fresh, dried, or frozen fruit. Canned fruit in natural juice (without added sugar). Meat and other protein foods  Skinless chicken or [place]. Ground chicken or [place]. Pork with fat trimmed off. Fish and seafood. Egg whites. Dried beans, peas, or lentils. Unsalted nuts, nut butters, and seeds. Unsalted canned beans. Lean cuts of beef with fat trimmed off. Low-sodium, lean deli meat. Dairy  Low-fat (1%) or fat-free (skim) milk. Fat-free, low-fat, or reduced-fat cheeses. Nonfat, low-sodium ricotta or cottage cheese. Low-fat or nonfat yogurt. Low-fat, low-sodium cheese. Fats and oils  Soft margarine without trans fats. Vegetable oil. Low-fat, reduced-fat, or light mayonnaise and salad dressings (reduced-sodium). Canola, safflower, olive, soybean, and sunflower oils. Avocado. Seasoning and other foods  Herbs. Spices. Seasoning mixes without salt. Unsalted popcorn and pretzels. Fat-free sweets. What foods are not recommended? The items listed may not be a complete list. Talk with your dietitian about what dietary choices are best for you. Grains  Baked goods made with fat, such as croissants, muffins, or some breads. Dry pasta or rice meal packs. Vegetables  Creamed or fried vegetables. Vegetables in a cheese sauce. Regular canned vegetables (not low-sodium or reduced-sodium). Regular canned tomato sauce and paste (not low-sodium or reduced-sodium). Regular tomato and vegetable juice (not low-sodium or reduced-sodium). Angie Fava. Olives. Fruits  Canned fruit in a light or heavy syrup. Fried fruit. Fruit in cream or butter sauce. Meat and other protein foods  Fatty cuts of meat. Ribs. Fried meat. Bernie[place] Salines. Sausage. Bologna and other processed lunch meats. Salami. Fatback. Hotdogs. Bratwurst. Salted nuts and seeds. Canned beans with added salt. Canned or smoked fish. Whole eggs or egg yolks. Chicken or [place] with skin. Dairy  Whole or 2% milk, cream, and half-and-half. Whole or full-fat cream cheese.  Whole-fat or sweetened yogurt. Full-fat cheese. Nondairy creamers. Whipped toppings. Processed cheese and cheese spreads. Fats and oils  Butter. Stick margarine. Lard. Shortening. Ghee. Bacon fat. Tropical oils, such as coconut, palm kernel, or palm oil. Seasoning and other foods  Salted popcorn and pretzels. Onion salt, garlic salt, seasoned salt, table salt, and sea salt. Worcestershire sauce. Tartar sauce. Barbecue sauce. Teriyaki sauce. Soy sauce, including reduced-sodium. Steak sauce. Canned and packaged gravies. Fish sauce. Oyster sauce. Cocktail sauce. Horseradish that you find on the shelf. Ketchup. Mustard. Meat flavorings and tenderizers. Bouillon cubes. Hot sauce and Tabasco sauce. Premade or packaged marinades. Premade or packaged taco seasonings. Relishes. Regular salad dressings. Where to find more information:  National Heart, Lung, and [place]: https://wilson-eaton.com/  American Heart Association: www.heart.org Summary  The DASH eating plan is a healthy eating plan that has been shown to reduce high blood pressure (hypertension). It may also reduce your risk for type 2 diabetes, heart disease, and stroke.  With the DASH eating plan, you should limit salt (sodium) intake to 2,300 mg a day. If you have hypertension, you may need to reduce your sodium intake to 1,500 mg a day.  When on the DASH eating plan, aim to eat more fresh fruits and  vegetables, whole grains, lean proteins, low-fat dairy, and heart-healthy fats.  Work with your health care provider or diet and nutrition specialist (dietitian) to adjust your eating plan to your individual calorie needs. This information is not intended to replace advice given to you by your health care provider. Make sure you discuss any questions you have with your health care provider. Document Released: 01/17/2011 Document Revised: 01/22/2016 Document Reviewed: 01/22/2016 Elsevier Interactive Patient Education  2017 [employer].

## 2016-04-29 NOTE — Progress Notes (Signed)
Subjective:    Patient ID: GWENEVERE GOGA, female    DOB: [birth year], 56 y.o.   MRN: [id number]  Patient presents today for complete physical or establish care (new patient).  HPI Last pcp: Dr. Antonietta Jewel [facility], [place], located in [place]) Labs last done 39month ago. Last seen 32months.  last CPE with GYN: over 1year ago).   denies any acute complains today  Immunizations: (TDAP, Hep C screen, Pneumovax, Influenza, zoster)  Health Maintenance  Topic Date Due  . Hemoglobin A1C  1960-11-08  .  Hepatitis C: One time screening is recommended by Center for Disease Control  (CDC) for  adults born from [birth year] through 1965.   Dec 19, 1960  . Pneumococcal vaccine (1) 10/15/1962  . Complete foot exam   10/15/1970  . Eye exam for diabetics  10/15/1970  . HIV Screening  10/15/1975  . Tetanus Vaccine  10/15/1979  . Pap Smear  10/14/1981  . Mammogram  10/15/2010  . Colon Cancer Screening  10/15/2010  . Flu Shot  09/12/2015   Diet:regular Weight:  Wt Readings from Last 3 Encounters:  04/29/16 (!) 337 lb (152.9 kg)  01/20/14 300 lb (136.1 kg)   Exercise:none Fall Risk: Fall Risk  04/29/2016  Falls in the past year? No   Home Safety:home with husband, works for [employer] Depression/Suicide: Depression screen PHQ 2/9 04/29/2016  Decreased Interest 0  Down, Depressed, Hopeless 0  PHQ - 2 Score 0   No flowsheet data found. Colonoscopy (every 5-64yrs, >50-51yrs):needed Pap Smear (every 68yrs for >21-29 without HPV, every 34yrs for >30-14yrs with HPV):last done over 2year ago, normal per patient, done by [facility]. Mammogram (yearly, >18yrs):needed Vision:needed Dental:needed,  Advanced Directive: Advanced Directives 04/29/2016  Does Patient Have a Medical Advance Directive? No   Sexual History (birth control, marital status, STD):married, adults children, sexually active  Medications and allergies reviewed with patient and updated if appropriate.  Patient Active  Problem List   Diagnosis Date Noted  . Diabetes ([place]) 04/29/2016  . HTN (hypertension), benign 04/29/2016  . Hyperlipidemia 04/29/2016    No current outpatient prescriptions on file prior to visit.   No current facility-administered medications on file prior to visit.     Past Medical History:  Diagnosis Date  . Diabetes mellitus without complication ([place])   . Hyperlipidemia   . Hypertension   . Obesity     Past Surgical History:  Procedure Laterality Date  . ABDOMINAL HYSTERECTOMY    . CHOLECYSTECTOMY    . DENTAL SURGERY    . LEG SURGERY      Social History   Social History  . Marital status: Married    Spouse name: N/A  . Number of children: N/A  . Years of education: N/A   Social History Main Topics  . Smoking status: Former Research scientist (life sciences)  . Smokeless tobacco: Never Used  . Alcohol use No  . Drug use: No  . Sexual activity: Not Asked   Other Topics Concern  . None   Social History Narrative  . None    Family History  Problem Relation Age of Onset  . Diabetes Mother   . Stroke Mother   . Dementia Mother   . Hypertension Mother   . Diabetes Other   . Hypertension Other   . Cancer Father         ROS  Objective:   Vitals:   04/29/16 1405  BP: 122/88  Pulse: 86  Temp: 98.6 F (37 C)  Body mass index is 56.08 kg/m.   Physical Examination:  Physical Exam  Constitutional: She is oriented to person, place, and time and well-developed, well-nourished, and in no distress. No distress.  HENT:  Right Ear: External ear normal.  Left Ear: External ear normal.  Nose: Nose normal.  Mouth/Throat: Oropharynx is clear and moist. No oropharyngeal exudate.  Eyes: Conjunctivae and EOM are normal. Pupils are equal, round, and reactive to light. No scleral icterus.  Neck: Normal range of motion. Neck supple. No thyromegaly present.  Cardiovascular: Normal rate, normal heart sounds and intact distal pulses.   Pulmonary/Chest: Effort normal and breath  sounds normal. She exhibits no tenderness.  Abdominal: Soft. Bowel sounds are normal. She exhibits no distension. There is no tenderness.  Musculoskeletal: Normal range of motion. She exhibits no edema or tenderness.  Lymphadenopathy:    She has no cervical adenopathy.  Neurological: She is alert and oriented to person, place, and time. Gait normal.  Skin: Skin is warm and dry.  Psychiatric: Affect and judgment normal.    ASSESSMENT and PLAN:  Avalynn was seen today for establish care.  Diagnoses and all orders for this visit:  Encounter for medical examination to establish care  Colon cancer screening -     Ambulatory referral to Gastroenterology  Type 2 diabetes mellitus with complication, without long-term current use of insulin ([place])  HTN (hypertension), benign  Hyperlipidemia, unspecified hyperlipidemia type   No problem-specific Assessment & Plan notes found for this encounter.     Follow up: Return in about 1 month (around 05/30/2016) for CPE(fasting), labs needed.  [name], NP

## 2016-05-03 ENCOUNTER — Encounter: Payer: Self-pay | Admitting: Gastroenterology

## 2016-06-07 ENCOUNTER — Ambulatory Visit: Payer: 59 | Admitting: Nurse Practitioner

## 2016-06-10 ENCOUNTER — Telehealth: Payer: Self-pay | Admitting: Nurse Practitioner

## 2016-06-10 NOTE — Telephone Encounter (Signed)
Rec'd from [facility] forward 21 pages to Flossie Buffy NP

## 2016-06-17 ENCOUNTER — Telehealth: Payer: Self-pay | Admitting: *Deleted

## 2016-06-17 NOTE — Telephone Encounter (Signed)
Dr Havery Moros,  This lady is scheduled with you for a direct colon 6-4 Monday , she has no GI history. Her BMI is 56.2 and her weight is 337.0lb.   Do you want an OV or can she be a direct at the hospital.  Please advise and thanks for your time, Marijean Niemann

## 2016-06-19 NOTE — Telephone Encounter (Signed)
Spoke with pt. An office visit was scheduled for 08/23/16 at 3 pm to discuss the colon, per Dr Havery Moros. PV on 07/01/16 and colon on 07/15/16 were cx'd. Pt aware. She will call if she has any questions.

## 2016-06-19 NOTE — Telephone Encounter (Signed)
Thanks for the note. I would like to see her in clinic first to discuss options (colonoscopy versus stool based testing). Thanks

## 2016-06-20 ENCOUNTER — Encounter: Payer: Self-pay | Admitting: Gastroenterology

## 2016-06-24 ENCOUNTER — Encounter: Payer: Self-pay | Admitting: Nurse Practitioner

## 2016-06-24 NOTE — Progress Notes (Signed)
Abstracted result and sent to scan  

## 2016-07-01 ENCOUNTER — Other Ambulatory Visit: Payer: Self-pay | Admitting: Internal Medicine

## 2016-07-01 DIAGNOSIS — Z1231 Encounter for screening mammogram for malignant neoplasm of breast: Secondary | ICD-10-CM

## 2016-07-15 ENCOUNTER — Encounter: Payer: 59 | Admitting: Gastroenterology

## 2016-07-23 ENCOUNTER — Ambulatory Visit: Payer: 59 | Admitting: Nurse Practitioner

## 2016-07-31 ENCOUNTER — Ambulatory Visit (INDEPENDENT_AMBULATORY_CARE_PROVIDER_SITE_OTHER): Payer: 59 | Admitting: Family

## 2016-07-31 ENCOUNTER — Telehealth: Payer: Self-pay | Admitting: Family

## 2016-07-31 ENCOUNTER — Encounter: Payer: Self-pay | Admitting: Family

## 2016-07-31 DIAGNOSIS — Z6841 Body Mass Index (BMI) 40.0 and over, adult: Secondary | ICD-10-CM | POA: Diagnosis not present

## 2016-07-31 DIAGNOSIS — M62830 Muscle spasm of back: Secondary | ICD-10-CM | POA: Insufficient documentation

## 2016-07-31 MED ORDER — CYCLOBENZAPRINE HCL 10 MG PO TABS: 10.0000 mg | ORAL_TABLET | Freq: Three times a day (TID) | ORAL | 0 refills | Status: DC | PRN

## 2016-07-31 NOTE — Patient Instructions (Addendum)
Thank you for choosing [facility]!  Ice / moist heat x 20 minutes every 2 hours and as needed or following activity  Exercises 1-2 times per day as instructed.    Medications  Duexis - 1 tablet 3 times per day for the next 5-7 days and then as needed.  You will receive a call from [facility] regarding your Pennsaid/Duexis/Vimovo. The medication will be mailed to you and should cost you no more than $10 per item or possibly free depending upon your insurance.   Your prescription(s) have been submitted to your pharmacy or been printed and provided for you. Please take as directed and contact our office if you believe you are having problem(s) with the medication(s) or have any questions.  Follow up:  If your symptoms worsen or fail to improve, please contact our office for further instruction, or in case of emergency go directly to the emergency room at the closest medical facility.    Low Back Strain Rehab Ask your health care provider which exercises are safe for you. Do exercises exactly as told by your health care provider and adjust them as directed. It is normal to feel mild stretching, pulling, tightness, or discomfort as you do these exercises, but you should stop right away if you feel sudden pain or your pain gets worse. Do not begin these exercises until told by your health care provider. Stretching and range of motion exercises These exercises warm up your muscles and joints and improve the movement and flexibility of your back. These exercises also help to relieve pain, numbness, and tingling. Exercise A: Single knee to chest  1. Lie on your back on a firm surface with both legs straight. 2. Bend one of your knees. Use your hands to move your knee up toward your chest until you feel a gentle stretch in your lower back and buttock. ? Hold your leg in this position by holding onto the front of your knee. ? Keep your other leg as straight as possible. 3. Hold for  __________ seconds. 4. Slowly return to the starting position. 5. Repeat with your other leg. Repeat __________ times. Complete this exercise __________ times a day. Exercise B: Prone extension on elbows  1. Lie on your abdomen on a firm surface. 2. Prop yourself up on your elbows. 3. Use your arms to help lift your chest up until you feel a gentle stretch in your abdomen and your lower back. ? This will place some of your body weight on your elbows. If this is uncomfortable, try stacking pillows under your chest. ? Your hips should stay down, against the surface that you are lying on. Keep your hip and back muscles relaxed. 4. Hold for __________ seconds. 5. Slowly relax your upper body and return to the starting position. Repeat __________ times. Complete this exercise __________ times a day. Strengthening exercises These exercises build strength and endurance in your back. Endurance is the ability to use your muscles for a long time, even after they get tired. Exercise C: Pelvic tilt 1. Lie on your back on a firm surface. Bend your knees and keep your feet flat. 2. Tense your abdominal muscles. Tip your pelvis up toward the ceiling and flatten your lower back into the floor. ? To help with this exercise, you may place a small towel under your lower back and try to push your back into the towel. 3. Hold for __________ seconds. 4. Let your muscles relax completely before you repeat this  exercise. Repeat __________ times. Complete this exercise __________ times a day. Exercise D: Alternating arm and leg raises  1. Get on your hands and knees on a firm surface. If you are on a hard floor, you may want to use padding to cushion your knees, such as an exercise mat. 2. Line up your arms and legs. Your hands should be below your shoulders, and your knees should be below your hips. 3. Lift your left leg behind you. At the same time, raise your right arm and straighten it in front of you. ? Do  not lift your leg higher than your hip. ? Do not lift your arm higher than your shoulder. ? Keep your abdominal and back muscles tight. ? Keep your hips facing the ground. ? Do not arch your back. ? Keep your balance carefully, and do not hold your breath. 4. Hold for __________ seconds. 5. Slowly return to the starting position and repeat with your right leg and your left arm. Repeat __________ times. Complete this exercise __________times a day. Exercise J: Single leg lower with bent knees 1. Lie on your back on a firm surface. 2. Tense your abdominal muscles and lift your feet off the floor, one foot at a time, so your knees and hips are bent in an "L" shape (at about 90 degrees). ? Your knees should be over your hips and your lower legs should be parallel to the floor. 3. Keeping your abdominal muscles tense and your knee bent, slowly lower one of your legs so your toe touches the ground. 4. Lift your leg back up to return to the starting position. ? Do not hold your breath. ? Do not let your back arch. Keep your back flat against the ground. 5. Repeat with your other leg. Repeat __________ times. Complete this exercise __________ times a day. Posture and body mechanics  Body mechanics refers to the movements and positions of your body while you do your daily activities. Posture is part of body mechanics. Good posture and healthy body mechanics can help to relieve stress in your body's tissues and joints. Good posture means that your spine is in its natural S-curve position (your spine is neutral), your shoulders are pulled back slightly, and your head is not tipped forward. The following are general guidelines for applying improved posture and body mechanics to your everyday activities. Standing   When standing, keep your spine neutral and your feet about hip-width apart. Keep a slight bend in your knees. Your ears, shoulders, and hips should line up.  When you do a task in which you  stand in one place for a long time, place one foot up on a stable object that is 2-4 inches (5-10 cm) high, such as a footstool. This helps keep your spine neutral. Sitting   When sitting, keep your spine neutral and keep your feet flat on the floor. Use a footrest, if necessary, and keep your thighs parallel to the floor. Avoid rounding your shoulders, and avoid tilting your head forward.  When working at a desk or a computer, keep your desk at a height where your hands are slightly lower than your elbows. Slide your chair under your desk so you are close enough to maintain good posture.  When working at a computer, place your monitor at a height where you are looking straight ahead and you do not have to tilt your head forward or downward to look at the screen. Resting   When lying down  and resting, avoid positions that are most painful for you.  If you have pain with activities such as sitting, bending, stooping, or squatting (flexion-based activities), lie in a position in which your body does not bend very much. For example, avoid curling up on your side with your arms and knees near your chest (fetal position).  If you have pain with activities such as standing for a long time or reaching with your arms (extension-based activities), lie with your spine in a neutral position and bend your knees slightly. Try the following positions: ? Lying on your side with a pillow between your knees. ? Lying on your back with a pillow under your knees. Lifting   When lifting objects, keep your feet at least shoulder-width apart and tighten your abdominal muscles.  Bend your knees and hips and keep your spine neutral. It is important to lift using the strength of your legs, not your back. Do not lock your knees straight out.  Always ask for help to lift heavy or awkward objects. This information is not intended to replace advice given to you by your health care provider. Make sure you discuss any  questions you have with your health care provider. Document Released: 01/28/2005 Document Revised: 10/05/2015 Document Reviewed: 11/09/2014 Elsevier Interactive Patient Education  [employer].

## 2016-07-31 NOTE — Assessment & Plan Note (Signed)
BMI of 56. Recommend weight loss of 5-10% of current body weight through nutrition and physical activity. This is a contributing factor to her current low back pain.

## 2016-07-31 NOTE — Assessment & Plan Note (Signed)
New onset right sided lumbar paraspinal back pain with no trauma or injury in the setting of previous back pain. Recommend conservative treatment with ice/moist heat and home exercise therapy. Start cyclobenzaprine and Duexis. Encouraged to increase physical activity and work on core strengthening. Follow up if symptoms worsen or do not improve.

## 2016-07-31 NOTE — Telephone Encounter (Signed)
Routing to greg, please advise, thanks 

## 2016-07-31 NOTE — Telephone Encounter (Signed)
Pt called stating the medicine she was given today made her sleepy and she just woke up, she wants something else called in that will not make her sleepy so she can go to work  Please advise

## 2016-07-31 NOTE — Progress Notes (Signed)
Subjective:    Patient ID: Ruth [name], female    DOB: October 05, 1960, 56 y.o.   MRN: [id number]  Chief Complaint  Patient presents with  . Back Pain    started saturday and got worse    HPI:  Ruth [name] is a 56 y.o. female who  has a past medical history of Diabetes mellitus without complication ([place]); Hyperlipidemia; Hypertension; and Obesity. and presents today for an acute office visit.   This is a new problem. Associated symptom of pain located across her lower back that has been gong on for about 4 days and described as sharp on occasion. No trauma or injury. Severity is enough to restrict her motion. Course of the symptoms have been worsening. Does have previous history of back injury and completed therapy which has not caused her problem. Modifying factors include heating pad, meloxicam and muscle rub which has not helped very much. No radiculopathy.    No Known Allergies    Outpatient Medications Prior to Visit  Medication Sig Dispense Refill  . atenolol (TENORMIN) 50 MG tablet Take 50 mg by mouth daily.    Marland Kitchen glipiZIDE (GLUCOTROL) 10 MG tablet Take 10 mg by mouth daily before breakfast.    . hydrochlorothiazide (HYDRODIURIL) 25 MG tablet Take 25 mg by mouth daily.    Marland Kitchen lisinopril (PRINIVIL,ZESTRIL) 10 MG tablet Take 10 mg by mouth daily.    Marland Kitchen lovastatin (MEVACOR) 20 MG tablet Take 20 mg by mouth at bedtime.    . metFORMIN (GLUCOPHAGE) 500 MG tablet Take by mouth 2 (two) times daily with a meal.     No facility-administered medications prior to visit.       Past Surgical History:  Procedure Laterality Date  . ABDOMINAL HYSTERECTOMY    . CHOLECYSTECTOMY    . DENTAL SURGERY    . LEG SURGERY        Past Medical History:  Diagnosis Date  . Diabetes mellitus without complication ([place])   . Hyperlipidemia   . Hypertension   . Obesity       Review of Systems  Constitutional: Negative for chills and fever.  Musculoskeletal: Positive for back pain.    Neurological: Negative for weakness and numbness.      Objective:    BP 118/76 (BP Location: Left Arm, Patient Position: Sitting, Cuff Size: Large)   Pulse 79   Temp 98.3 F (36.8 C) (Oral)   Ht 5\' 5"  (1.651 m)   Wt (!) 338 lb (153.3 kg)   SpO2 99%   BMI 56.25 kg/m  Nursing note and vital signs reviewed.  Physical Exam  Constitutional: She is oriented to person, place, and time. She appears well-developed and well-nourished. No distress.  Cardiovascular: Normal rate, regular rhythm, normal heart sounds and intact distal pulses.   Pulmonary/Chest: Effort normal and breath sounds normal.  Musculoskeletal:  Low back - no obvious deformity, discoloration, or edema. Palpable tenderness along right lumbar paraspinal musculature with no deformity or crepitus. Range of motion limited in flexion with discomfort noted in right lateral bending and rotation. Distal pulses and sensation are intact and appropriate. Negative straight leg raise.   Neurological: She is alert and oriented to person, place, and time.  Skin: Skin is warm and dry.  Psychiatric: She has a normal mood and affect. Her behavior is normal. Judgment and thought content normal.       Assessment & Plan:   Problem List Items Addressed This Visit      Other  Morbid obesity with BMI of 50.0-59.9, adult ([facility])    BMI of 56. Recommend weight loss of 5-10% of current body weight through nutrition and physical activity. This is a contributing factor to her current low back pain.       Lumbar paraspinal muscle spasm    New onset right sided lumbar paraspinal back pain with no trauma or injury in the setting of previous back pain. Recommend conservative treatment with ice/moist heat and home exercise therapy. Start cyclobenzaprine and Duexis. Encouraged to increase physical activity and work on core strengthening. Follow up if symptoms worsen or do not improve.           I am having Ms. Ruth [name] start on cyclobenzaprine. I am  also having her maintain her metFORMIN, lisinopril, hydrochlorothiazide, glipiZIDE, atenolol, and lovastatin.   Meds ordered this encounter  Medications  . cyclobenzaprine (FLEXERIL) 10 MG tablet    Sig: Take 1 tablet (10 mg total) by mouth 3 (three) times daily as needed for muscle spasms.    Dispense:  30 tablet    Refill:  0    Order Specific Question:   Supervising Provider    Answer:   Pricilla Holm A [[id number]]     Follow-up: Return in about 1 month (around 08/30/2016), or if symptoms worsen or fail to improve.  Mauricio Po, FNP

## 2016-08-01 NOTE — Telephone Encounter (Signed)
FYI: Patient has called back in regard.  Stating muscle relaxer is not relieving her pain.  She also states she needs a note to continue to be out of work until pain is gone.  Patient is requesting Baldo Ash to review.  Patient already has follow up appt scheduled with Baldo Ash on 6/22 at 4pm.  Did advise patient that Baldo Ash would need to evaluate her before making any decisions and advised her to keep appt for tomorrow.

## 2016-08-01 NOTE — Telephone Encounter (Signed)
Noted  

## 2016-08-02 ENCOUNTER — Ambulatory Visit (INDEPENDENT_AMBULATORY_CARE_PROVIDER_SITE_OTHER): Payer: 59 | Admitting: Nurse Practitioner

## 2016-08-02 ENCOUNTER — Encounter: Payer: Self-pay | Admitting: Nurse Practitioner

## 2016-08-02 ENCOUNTER — Other Ambulatory Visit (INDEPENDENT_AMBULATORY_CARE_PROVIDER_SITE_OTHER): Payer: 59

## 2016-08-02 VITALS — BP 130/82 | HR 74 | Temp 98.7°F | Ht 65.0 in | Wt 335.0 lb

## 2016-08-02 DIAGNOSIS — E785 Hyperlipidemia, unspecified: Secondary | ICD-10-CM | POA: Diagnosis not present

## 2016-08-02 DIAGNOSIS — Z114 Encounter for screening for human immunodeficiency virus [HIV]: Secondary | ICD-10-CM

## 2016-08-02 DIAGNOSIS — E118 Type 2 diabetes mellitus with unspecified complications: Secondary | ICD-10-CM

## 2016-08-02 DIAGNOSIS — Z0001 Encounter for general adult medical examination with abnormal findings: Secondary | ICD-10-CM

## 2016-08-02 DIAGNOSIS — I10 Essential (primary) hypertension: Secondary | ICD-10-CM

## 2016-08-02 DIAGNOSIS — Z1159 Encounter for screening for other viral diseases: Secondary | ICD-10-CM | POA: Diagnosis not present

## 2016-08-02 DIAGNOSIS — M62830 Muscle spasm of back: Secondary | ICD-10-CM | POA: Diagnosis not present

## 2016-08-02 LAB — HEPATIC FUNCTION PANEL
ALBUMIN: 4 g/dL (ref 3.5–5.2)
ALT: 20 U/L (ref 0–35)
AST: 18 U/L (ref 0–37)
Alkaline Phosphatase: 65 U/L (ref 39–117)
BILIRUBIN TOTAL: 0.3 mg/dL (ref 0.2–1.2)
Bilirubin, Direct: 0.1 mg/dL (ref 0.0–0.3)
Total Protein: 7.5 g/dL (ref 6.0–8.3)

## 2016-08-02 LAB — MICROALBUMIN / CREATININE URINE RATIO
CREATININE, U: 101.2 mg/dL
MICROALB/CREAT RATIO: 0.7 mg/g (ref 0.0–30.0)
Microalb, Ur: <0.7 mg/dL (ref 0.0–1.9)

## 2016-08-02 LAB — CBC
HEMATOCRIT: 41 % (ref 36.0–46.0)
HEMOGLOBIN: 13.4 g/dL (ref 12.0–15.0)
MCHC: 32.7 g/dL (ref 30.0–36.0)
MCV: 89.8 fl (ref 78.0–100.0)
Platelets: 282 10*3/uL (ref 150.0–400.0)
RBC: 4.56 Mil/uL (ref 3.87–5.11)
RDW: 14.2 % (ref 11.5–15.5)
WBC: 9.1 10*3/uL (ref 4.0–10.5)

## 2016-08-02 LAB — BASIC METABOLIC PANEL
BUN: 15 mg/dL (ref 6–23)
CALCIUM: 10.1 mg/dL (ref 8.4–10.5)
CO2: 31 meq/L (ref 19–32)
Chloride: 103 mEq/L (ref 96–112)
Creatinine, Ser: 0.68 mg/dL (ref 0.40–1.20)
GFR: 115.19 mL/min (ref >60.00)
GLUCOSE: 87 mg/dL (ref 70–99)
POTASSIUM: 4 meq/L (ref 3.5–5.1)
SODIUM: 142 meq/L (ref 135–145)

## 2016-08-02 LAB — LIPID PANEL
CHOL/HDL RATIO: 3
Cholesterol: 176 mg/dL (ref 0–200)
HDL: 51.4 mg/dL (ref >39.00)
LDL Cholesterol: 103 mg/dL — ABNORMAL HIGH (ref 0–99)
NonHDL: 124.96
TRIGLYCERIDES: 110 mg/dL (ref 0.0–149.0)
VLDL: 22 mg/dL (ref 0.0–40.0)

## 2016-08-02 LAB — TSH: TSH: 1.88 u[IU]/mL (ref 0.35–4.50)

## 2016-08-02 LAB — HEMOGLOBIN A1C: Hgb A1c MFr Bld: 7.6 % — ABNORMAL HIGH (ref 4.6–6.5)

## 2016-08-02 MED ORDER — KETOROLAC TROMETHAMINE 30 MG/ML IJ SOLN
30.0000 mg | Freq: Once | INTRAMUSCULAR | Status: AC
  Administered 2016-08-02: 30 mg via INTRAMUSCULAR

## 2016-08-02 MED ORDER — PREDNISONE 10 MG (21) PO TBPK: ORAL_TABLET | ORAL | 0 refills | Status: DC

## 2016-08-02 MED ORDER — METHOCARBAMOL 500 MG PO TABS: 500.0000 mg | ORAL_TABLET | Freq: Three times a day (TID) | ORAL | 0 refills | Status: DC | PRN

## 2016-08-02 NOTE — Patient Instructions (Addendum)
Schedule appt for colonoscopy, PAP smear, and mammogram.  Go to basement for blood draw and urine collection.  Will send medication refill after review of lab results.  You will be contacted when form is completed.  Continue alternation of warm and cold compress.  Back Exercises If you have pain in your back, do these exercises 2-3 times each day or as told by your doctor. When the pain goes away, do the exercises once each day, but repeat the steps more times for each exercise (do more repetitions). If you do not have pain in your back, do these exercises once each day or as told by your doctor. Exercises Single Knee to Chest  Do these steps 3-5 times in a row for each leg: 1. Lie on your back on a firm bed or the floor with your legs stretched out. 2. Bring one knee to your chest. 3. Hold your knee to your chest by grabbing your knee or thigh. 4. Pull on your knee until you feel a gentle stretch in your lower back. 5. Keep doing the stretch for 10-30 seconds. 6. Slowly let go of your leg and straighten it.  Pelvic Tilt  Do these steps 5-10 times in a row: 1. Lie on your back on a firm bed or the floor with your legs stretched out. 2. Bend your knees so they point up to the ceiling. Your feet should be flat on the floor. 3. Tighten your lower belly (abdomen) muscles to press your lower back against the floor. This will make your tailbone point up to the ceiling instead of pointing down to your feet or the floor. 4. Stay in this position for 5-10 seconds while you gently tighten your muscles and breathe evenly.  Cat-Cow  Do these steps until your lower back bends more easily: 1. Get on your hands and knees on a firm surface. Keep your hands under your shoulders, and keep your knees under your hips. You may put padding under your knees. 2. Let your head hang down, and make your tailbone point down to the floor so your lower back is round like the back of a cat. 3. Stay in this  position for 5 seconds. 4. Slowly lift your head and make your tailbone point up to the ceiling so your back hangs low (sags) like the back of a cow. 5. Stay in this position for 5 seconds.  Press-Ups  Do these steps 5-10 times in a row: 1. Lie on your belly (face-down) on the floor. 2. Place your hands near your head, about shoulder-width apart. 3. While you keep your back relaxed and keep your hips on the floor, slowly straighten your arms to raise the top half of your body and lift your shoulders. Do not use your back muscles. To make yourself more comfortable, you may change where you place your hands. 4. Stay in this position for 5 seconds. 5. Slowly return to lying flat on the floor.  Bridges  Do these steps 10 times in a row: 1. Lie on your back on a firm surface. 2. Bend your knees so they point up to the ceiling. Your feet should be flat on the floor. 3. Tighten your butt muscles and lift your butt off of the floor until your waist is almost as high as your knees. If you do not feel the muscles working in your butt and the back of your thighs, slide your feet 1-2 inches farther away from your butt. 4. Stay  in this position for 3-5 seconds. 5. Slowly lower your butt to the floor, and let your butt muscles relax.  If this exercise is too easy, try doing it with your arms crossed over your chest. Belly Crunches  Do these steps 5-10 times in a row: 1. Lie on your back on a firm bed or the floor with your legs stretched out. 2. Bend your knees so they point up to the ceiling. Your feet should be flat on the floor. 3. Cross your arms over your chest. 4. Tip your chin a little bit toward your chest but do not bend your neck. 5. Tighten your belly muscles and slowly raise your chest just enough to lift your shoulder blades a tiny bit off of the floor. 6. Slowly lower your chest and your head to the floor.  Back Lifts Do these steps 5-10 times in a row: 1. Lie on your belly  (face-down) with your arms at your sides, and rest your forehead on the floor. 2. Tighten the muscles in your legs and your butt. 3. Slowly lift your chest off of the floor while you keep your hips on the floor. Keep the back of your head in line with the curve in your back. Look at the floor while you do this. 4. Stay in this position for 3-5 seconds. 5. Slowly lower your chest and your face to the floor.  Contact a doctor if:  Your back pain gets a lot worse when you do an exercise.  Your back pain does not lessen 2 hours after you exercise. If you have any of these problems, stop doing the exercises. Do not do them again unless your doctor says it is okay. Get help right away if:  You have sudden, very bad back pain. If this happens, stop doing the exercises. Do not do them again unless your doctor says it is okay. This information is not intended to replace advice given to you by your health care provider. Make sure you discuss any questions you have with your health care provider. Document Released: 03/02/2010 Document Revised: 07/06/2015 Document Reviewed: 03/24/2014 Elsevier Interactive Patient Education  2018 [place] Maintenance, Female Adopting a healthy lifestyle and getting preventive care can go a long way to promote health and wellness. Talk with your health care provider about what schedule of regular examinations is right for you. This is a good chance for you to check in with your provider about disease prevention and staying healthy. In between checkups, there are plenty of things you can do on your own. Experts have done a lot of research about which lifestyle changes and preventive measures are most likely to keep you healthy. Ask your health care provider for more information. Weight and diet Eat a healthy diet  Be sure to include plenty of vegetables, fruits, low-fat dairy products, and lean protein.  Do not eat a lot of foods high in solid fats, added  sugars, or salt.  Get regular exercise. This is one of the most important things you can do for your health. ? Most adults should exercise for at least 150 minutes each week. The exercise should increase your heart rate and make you sweat (moderate-intensity exercise). ? Most adults should also do strengthening exercises at least twice a week. This is in addition to the moderate-intensity exercise.  Maintain a healthy weight  Body mass index (BMI) is a measurement that can be used to identify possible weight problems. It estimates body  fat based on height and weight. Your health care provider can help determine your BMI and help you achieve or maintain a healthy weight.  For females 65 years of age and older: ? A BMI below 18.5 is considered underweight. ? A BMI of 18.5 to 24.9 is normal. ? A BMI of 25 to 29.9 is considered overweight. ? A BMI of 30 and above is considered obese.  Watch levels of cholesterol and blood lipids  You should start having your blood tested for lipids and cholesterol at 56 years of age, then have this test every 5 years.  You may need to have your cholesterol levels checked more often if: ? Your lipid or cholesterol levels are high. ? You are older than 56 years of age. ? You are at high risk for heart disease.  Cancer screening Lung Cancer  Lung cancer screening is recommended for adults 19-59 years old who are at high risk for lung cancer because of a history of smoking.  A yearly low-dose CT scan of the lungs is recommended for people who: ? Currently smoke. ? Have quit within the past 15 years. ? Have at least a 30-pack-year history of smoking. A pack year is smoking an average of one pack of cigarettes a day for 1 year.  Yearly screening should continue until it has been 15 years since you quit.  Yearly screening should stop if you develop a health problem that would prevent you from having lung cancer treatment.  Breast Cancer  Practice breast  self-awareness. This means understanding how your breasts normally appear and feel.  It also means doing regular breast self-exams. Let your health care provider know about any changes, no matter how small.  If you are in your 20s or 30s, you should have a clinical breast exam (CBE) by a health care provider every 1-3 years as part of a regular health exam.  If you are 68 or older, have a CBE every year. Also consider having a breast X-ray (mammogram) every year.  If you have a family history of breast cancer, talk to your health care provider about genetic screening.  If you are at high risk for breast cancer, talk to your health care provider about having an MRI and a mammogram every year.  Breast cancer gene (BRCA) assessment is recommended for women who have family members with BRCA-related cancers. BRCA-related cancers include: ? Breast. ? Ovarian. ? Tubal. ? Peritoneal cancers.  Results of the assessment will determine the need for genetic counseling and BRCA1 and BRCA2 testing.  Cervical Cancer Your health care provider may recommend that you be screened regularly for cancer of the pelvic organs (ovaries, uterus, and vagina). This screening involves a pelvic examination, including checking for microscopic changes to the surface of your cervix (Pap test). You may be encouraged to have this screening done every 3 years, beginning at age 65.  For women ages 15-65, health care providers may recommend pelvic exams and Pap testing every 3 years, or they may recommend the Pap and pelvic exam, combined with testing for human papilloma virus (HPV), every 5 years. Some types of HPV increase your risk of cervical cancer. Testing for HPV may also be done on women of any age with unclear Pap test results.  Other health care providers may not recommend any screening for nonpregnant women who are considered low risk for pelvic cancer and who do not have symptoms. Ask your health care provider if a  screening pelvic exam  is right for you.  If you have had past treatment for cervical cancer or a condition that could lead to cancer, you need Pap tests and screening for cancer for at least 20 years after your treatment. If Pap tests have been discontinued, your risk factors (such as having a new sexual partner) need to be reassessed to determine if screening should resume. Some women have medical problems that increase the chance of getting cervical cancer. In these cases, your health care provider may recommend more frequent screening and Pap tests.  Colorectal Cancer  This type of cancer can be detected and often prevented.  Routine colorectal cancer screening usually begins at 56 years of age and continues through 56 years of age.  Your health care provider may recommend screening at an earlier age if you have risk factors for colon cancer.  Your health care provider may also recommend using home test kits to check for hidden blood in the stool.  A small camera at the end of a tube can be used to examine your colon directly (sigmoidoscopy or colonoscopy). This is done to check for the earliest forms of colorectal cancer.  Routine screening usually begins at age 17.  Direct examination of the colon should be repeated every 5-10 years through 56 years of age. However, you may need to be screened more often if early forms of precancerous polyps or small growths are found.  Skin Cancer  Check your skin from head to toe regularly.  Tell your health care provider about any new moles or changes in moles, especially if there is a change in a mole's shape or color.  Also tell your health care provider if you have a mole that is larger than the size of a pencil eraser.  Always use sunscreen. Apply sunscreen liberally and repeatedly throughout the day.  Protect yourself by wearing long sleeves, pants, a wide-brimmed hat, and sunglasses whenever you are outside.  Heart disease, diabetes, and  high blood pressure  High blood pressure causes heart disease and increases the risk of stroke. High blood pressure is more likely to develop in: ? People who have blood pressure in the high end of the normal range (130-139/85-89 mm Hg). ? People who are overweight or obese. ? People who are African American.  If you are 48-55 years of age, have your blood pressure checked every 3-5 years. If you are 39 years of age or older, have your blood pressure checked every year. You should have your blood pressure measured twice-once when you are at a hospital or clinic, and once when you are not at a hospital or clinic. Record the average of the two measurements. To check your blood pressure when you are not at a hospital or clinic, you can use: ? An automated blood pressure machine at a pharmacy. ? A home blood pressure monitor.  If you are between 6 years and 72 years old, ask your health care provider if you should take aspirin to prevent strokes.  Have regular diabetes screenings. This involves taking a blood sample to check your fasting blood sugar level. ? If you are at a normal weight and have a low risk for diabetes, have this test once every three years after 56 years of age. ? If you are overweight and have a high risk for diabetes, consider being tested at a younger age or more often. Preventing infection Hepatitis B  If you have a higher risk for hepatitis B, you should be screened  for this virus. You are considered at high risk for hepatitis B if: ? You were born in a country where hepatitis B is common. Ask your health care provider which countries are considered high risk. ? Your parents were born in a high-risk country, and you have not been immunized against hepatitis B (hepatitis B vaccine). ? You have HIV or AIDS. ? You use needles to inject street drugs. ? You live with someone who has hepatitis B. ? You have had sex with someone who has hepatitis B. ? You get hemodialysis  treatment. ? You take certain medicines for conditions, including cancer, organ transplantation, and autoimmune conditions.  Hepatitis C  Blood testing is recommended for: ? Everyone born from [birth year] through 1965. ? Anyone with known risk factors for hepatitis C.  Sexually transmitted infections (STIs)  You should be screened for sexually transmitted infections (STIs) including gonorrhea and chlamydia if: ? You are sexually active and are younger than 56 years of age. ? You are older than 56 years of age and your health care provider tells you that you are at risk for this type of infection. ? Your sexual activity has changed since you were last screened and you are at an increased risk for chlamydia or gonorrhea. Ask your health care provider if you are at risk.  If you do not have HIV, but are at risk, it may be recommended that you take a prescription medicine daily to prevent HIV infection. This is called pre-exposure prophylaxis (PrEP). You are considered at risk if: ? You are sexually active and do not regularly use condoms or know the HIV status of your partner(s). ? You take drugs by injection. ? You are sexually active with a partner who has HIV.  Talk with your health care provider about whether you are at high risk of being infected with HIV. If you choose to begin PrEP, you should first be tested for HIV. You should then be tested every 3 months for as long as you are taking PrEP. Pregnancy  If you are premenopausal and you may become pregnant, ask your health care provider about preconception counseling.  If you may become pregnant, take 400 to 800 micrograms (mcg) of folic acid every day.  If you want to prevent pregnancy, talk to your health care provider about birth control (contraception). Osteoporosis and menopause  Osteoporosis is a disease in which the bones lose minerals and strength with aging. This can result in serious bone fractures. Your risk for osteoporosis  can be identified using a bone density scan.  If you are 57 years of age or older, or if you are at risk for osteoporosis and fractures, ask your health care provider if you should be screened.  Ask your health care provider whether you should take a calcium or vitamin D supplement to lower your risk for osteoporosis.  Menopause may have certain physical symptoms and risks.  Hormone replacement therapy may reduce some of these symptoms and risks. Talk to your health care provider about whether hormone replacement therapy is right for you. Follow these instructions at home:  Schedule regular health, dental, and eye exams.  Stay current with your immunizations.  Do not use any tobacco products including cigarettes, chewing tobacco, or electronic cigarettes.  If you are pregnant, do not drink alcohol.  If you are breastfeeding, limit how much and how often you drink alcohol.  Limit alcohol intake to no more than 1 drink per day for nonpregnant women. One  drink equals 12 ounces of beer, 5 ounces of wine, or 1 ounces of hard liquor.  Do not use street drugs.  Do not share needles.  Ask your health care provider for help if you need support or information about quitting drugs.  Tell your health care provider if you often feel depressed.  Tell your health care provider if you have ever been abused or do not feel safe at home. This information is not intended to replace advice given to you by your health care provider. Make sure you discuss any questions you have with your health care provider. Document Released: 08/13/2010 Document Revised: 07/06/2015 Document Reviewed: 11/01/2014 Else[employer] Interactive Patient Education  [employer].

## 2016-08-02 NOTE — Progress Notes (Signed)
Subjective:  Patient ID: Ruth [name], female    DOB: November 28, 1960  Age: 56 y.o. MRN: [id number]  CC: Back Pain (back pain--med doesnt work-/ pt stated she suppose to get cpe (look at the appt we made 15 min) pt can not get off work too often)   Back Pain  This is a recurrent problem. The current episode started more than 1 year ago. The problem occurs intermittently. The problem has been waxing and waning since onset. The pain is present in the lumbar spine. The quality of the pain is described as aching and cramping. The pain does not radiate. The symptoms are aggravated by bending and twisting. Pertinent negatives include no abdominal pain, bladder incontinence, bowel incontinence, chest pain, dysuria, fever, headaches, leg pain, numbness, paresis, paresthesias, pelvic pain, perianal numbness, tingling, weakness or weight loss. Risk factors include lack of exercise, obesity, poor posture and sedentary lifestyle. She has tried bed rest, ice, NSAIDs, muscle relaxant and heat for the symptoms. The treatment provided mild relief.   Will reschedule appt with GYN for PAP and mammogram.  Will reschedule appt with GI for colonoscopy.  Vision: She will schedule eye exam.  Declined any immunizations.  DM: Stable.  HTN: Stable.  Hyperlipidemia: stable  Depression screen PHQ 2/9 04/29/2016  Decreased Interest 0  Down, Depressed, Hopeless 0  PHQ - 2 Score 0   Past Medical History:  Diagnosis Date  . Diabetes mellitus without complication ([place])   . Hyperlipidemia   . Hypertension   . Obesity     Outpatient Medications Prior to Visit  Medication Sig Dispense Refill  . atenolol (TENORMIN) 50 MG tablet Take 50 mg by mouth daily.    . cyclobenzaprine (FLEXERIL) 10 MG tablet Take 1 tablet (10 mg total) by mouth 3 (three) times daily as needed for muscle spasms. 30 tablet 0  . glipiZIDE (GLUCOTROL) 10 MG tablet Take 10 mg by mouth daily before breakfast.    . hydrochlorothiazide  (HYDRODIURIL) 25 MG tablet Take 25 mg by mouth daily.    Marland Kitchen lisinopril (PRINIVIL,ZESTRIL) 10 MG tablet Take 10 mg by mouth daily.    Marland Kitchen lovastatin (MEVACOR) 20 MG tablet Take 20 mg by mouth at bedtime.    . metFORMIN (GLUCOPHAGE) 500 MG tablet Take by mouth 2 (two) times daily with a meal.     No facility-administered medications prior to visit.     ROS Review of Systems  Constitutional: Negative for fever, malaise/fatigue and weight loss.  HENT: Negative for congestion and sore throat.   Eyes:       Negative for visual changes  Respiratory: Negative for cough and shortness of breath.   Cardiovascular: Negative for chest pain, palpitations and leg swelling.  Gastrointestinal: Negative for abdominal pain, blood in stool, bowel incontinence, constipation, diarrhea and heartburn.  Genitourinary: Negative for bladder incontinence, dysuria, frequency, pelvic pain and urgency.  Musculoskeletal: Positive for back pain. Negative for falls, joint pain and myalgias.  Skin: Negative for rash.  Neurological: Negative for dizziness, tingling, sensory change, weakness, numbness, headaches and paresthesias.  Endo/Heme/Allergies: Does not bruise/bleed easily.  Psychiatric/Behavioral: Negative for depression, substance abuse and suicidal ideas. The patient is not nervous/anxious and does not have insomnia.     Objective:  BP 130/82   Pulse 74   Temp 98.7 F (37.1 C)   Ht 5\' 5"  (1.651 m)   Wt (!) 335 lb (152 kg)   SpO2 98%   BMI 55.75 kg/m   BP Readings from Last  3 Encounters:  08/02/16 130/82  07/31/16 118/76  04/29/16 122/88    Wt Readings from Last 3 Encounters:  08/02/16 (!) 335 lb (152 kg)  07/31/16 (!) 338 lb (153.3 kg)  04/29/16 (!) 337 lb (152.9 kg)    Physical Exam  Constitutional: She is oriented to person, place, and time. No distress.  HENT:  Right Ear: External ear normal.  Left Ear: External ear normal.  Nose: Nose normal.  Mouth/Throat: No oropharyngeal exudate.    Eyes: No scleral icterus.  Neck: Normal range of motion. Neck supple.  Cardiovascular: Normal rate, regular rhythm and normal heart sounds.   Pulmonary/Chest: Effort normal and breath sounds normal. No respiratory distress.  Abdominal: Soft. She exhibits no distension.  Musculoskeletal: Normal range of motion. She exhibits tenderness. She exhibits no edema or deformity.  Negative straight leg raise. No muscle weakness.  Lymphadenopathy:    She has no cervical adenopathy.  Neurological: She is alert and oriented to person, place, and time.  Skin: Skin is warm and dry.  Psychiatric: She has a normal mood and affect. Her behavior is normal.  Vitals reviewed.   Lab Results  Component Value Date   WBC 9.1 08/02/2016   HGB 13.4 08/02/2016   HCT 41.0 08/02/2016   PLT 282.0 08/02/2016   GLUCOSE 87 08/02/2016   CHOL 176 08/02/2016   TRIG 110.0 08/02/2016   HDL 51.40 08/02/2016   LDLCALC 103 (H) 08/02/2016   ALT 20 08/02/2016   AST 18 08/02/2016   NA 142 08/02/2016   K 4.0 08/02/2016   CL 103 08/02/2016   CREATININE 0.68 08/02/2016   BUN 15 08/02/2016   CO2 31 08/02/2016   TSH 1.88 08/02/2016   HGBA1C 7.6 (H) 08/02/2016   MICROALBUR <0.7 08/02/2016    Dg Knee Complete 4 Views Right  Result Date: 03/28/2016 CLINICAL DATA:  56 y/o F; right knee pain around the patella area for 3 months. History of fall. EXAM: RIGHT KNEE - COMPLETE 4+ VIEW COMPARISON:  None. FINDINGS: No evidence of fracture, dislocation, or joint effusion. Spurring of the tibial spines. No other evidence of arthropathy or other focal bone abnormality. Soft tissues are unremarkable. IMPRESSION: Negative. Electronically Signed   By: Kristine Garbe M.D.   On: 03/28/2016 16:09    Assessment & Plan:   Naara was seen today for back pain.  Diagnoses and all orders for this visit:  Encounter for preventative adult health care exam with abnormal findings -     CBC; Future -     Hepatic function panel;  Future -     Basic metabolic panel; Future -     Lipid panel; Future -     TSH; Future -     HIV antibody; Future -     Hepatitis C Antibody; Future  HTN (hypertension), benign -     atenolol (TENORMIN) 50 MG tablet; Take 1 tablet (50 mg total) by mouth daily. -     glipiZIDE (GLUCOTROL) 10 MG tablet; Take 1 tablet (10 mg total) by mouth daily before breakfast. -     metFORMIN (GLUCOPHAGE) 500 MG tablet; Take 1 tablet (500 mg total) by mouth 2 (two) times daily with a meal.  Type 2 diabetes mellitus with complication, without long-term current use of insulin (HCC) -     Microalbumin / creatinine urine ratio; Future -     Hemoglobin A1c; Future -     hydrochlorothiazide (HYDRODIURIL) 25 MG tablet; Take 1 tablet (25 mg total) by  mouth daily. -     lisinopril (PRINIVIL,ZESTRIL) 10 MG tablet; Take 1 tablet (10 mg total) by mouth daily.  Hyperlipidemia, unspecified hyperlipidemia type -     lovastatin (MEVACOR) 20 MG tablet; Take 1 tablet (20 mg total) by mouth at bedtime.  Lumbar paraspinal muscle spasm -     methocarbamol (ROBAXIN) 500 MG tablet; Take 1 tablet (500 mg total) by mouth every 8 (eight) hours as needed for muscle spasms. -     ketorolac (TORADOL) 30 MG/ML injection 30 mg; Inject 1 mL (30 mg total) into the muscle once. -     predniSONE (STERAPRED UNI-PAK 21 TAB) 10 MG (21) TBPK tablet; Take by mouth as directed.  Encounter for hepatitis C screening test for low risk patient -     Hepatitis C Antibody; Future  Screening for HIV (human immunodeficiency virus) -     HIV antibody; Future   I have discontinued Ms. Seneca's cyclobenzaprine. I have also changed her atenolol, glipiZIDE, hydrochlorothiazide, lisinopril, lovastatin, and metFORMIN. Additionally, I am having her start on methocarbamol and predniSONE. We administered ketorolac.  Meds ordered this encounter  Medications  . methocarbamol (ROBAXIN) 500 MG tablet    Sig: Take 1 tablet (500 mg total) by mouth every 8  (eight) hours as needed for muscle spasms.    Dispense:  30 tablet    Refill:  0    Order Specific Question:   Supervising Provider    Answer:   Binnie Rail [[id number]]  . ketorolac (TORADOL) 30 MG/ML injection 30 mg  . predniSONE (STERAPRED UNI-PAK 21 TAB) 10 MG (21) TBPK tablet    Sig: Take by mouth as directed.    Dispense:  21 tablet    Refill:  0    Order Specific Question:   Supervising Provider    Answer:   Binnie Rail [[id number]]  . atenolol (TENORMIN) 50 MG tablet    Sig: Take 1 tablet (50 mg total) by mouth daily.    Dispense:  90 tablet    Refill:  1    Order Specific Question:   Supervising Provider    Answer:   Cassandria Anger [[id number]]  . glipiZIDE (GLUCOTROL) 10 MG tablet    Sig: Take 1 tablet (10 mg total) by mouth daily before breakfast.    Dispense:  90 tablet    Refill:  1    Order Specific Question:   Supervising Provider    Answer:   Cassandria Anger [[id number]]  . hydrochlorothiazide (HYDRODIURIL) 25 MG tablet    Sig: Take 1 tablet (25 mg total) by mouth daily.    Dispense:  90 tablet    Refill:  1    Order Specific Question:   Supervising Provider    Answer:   Cassandria Anger [[id number]]  . lisinopril (PRINIVIL,ZESTRIL) 10 MG tablet    Sig: Take 1 tablet (10 mg total) by mouth daily.    Dispense:  90 tablet    Refill:  1    Order Specific Question:   Supervising Provider    Answer:   Cassandria Anger [[id number]]  . lovastatin (MEVACOR) 20 MG tablet    Sig: Take 1 tablet (20 mg total) by mouth at bedtime.    Dispense:  90 tablet    Refill:  1    Order Specific Question:   Supervising Provider    Answer:   Cassandria Anger [[id number]]  . metFORMIN (GLUCOPHAGE) 500 MG tablet    Sig: Take  1 tablet (500 mg total) by mouth 2 (two) times daily with a meal.    Dispense:  180 tablet    Refill:  1    Order Specific Question:   Supervising Provider    Answer:   Cassandria Anger [[id number]]    Follow-up: Return in about 6 months (around 02/01/2017) for DM  and HTN, hyperlipidemia.  [name], NP

## 2016-08-03 LAB — HIV ANTIBODY (ROUTINE TESTING W REFLEX): HIV 1&2 Ab, 4th Generation: NONREACTIVE

## 2016-08-03 LAB — HEPATITIS C ANTIBODY: HCV Ab: NEGATIVE

## 2016-08-05 ENCOUNTER — Telehealth: Payer: Self-pay | Admitting: Nurse Practitioner

## 2016-08-05 MED ORDER — LOVASTATIN 20 MG PO TABS: 20.0000 mg | ORAL_TABLET | Freq: Every day | ORAL | 1 refills | Status: DC

## 2016-08-05 MED ORDER — METFORMIN HCL 500 MG PO TABS: 500.0000 mg | ORAL_TABLET | Freq: Two times a day (BID) | ORAL | 1 refills | Status: DC

## 2016-08-05 MED ORDER — GLIPIZIDE 10 MG PO TABS: 10.0000 mg | ORAL_TABLET | Freq: Every day | ORAL | 1 refills | Status: DC

## 2016-08-05 MED ORDER — ATENOLOL 50 MG PO TABS: 50.0000 mg | ORAL_TABLET | Freq: Every day | ORAL | 1 refills | Status: DC

## 2016-08-05 MED ORDER — HYDROCHLOROTHIAZIDE 25 MG PO TABS: 25.0000 mg | ORAL_TABLET | Freq: Every day | ORAL | 1 refills | Status: DC

## 2016-08-05 MED ORDER — LISINOPRIL 10 MG PO TABS: 10.0000 mg | ORAL_TABLET | Freq: Every day | ORAL | 1 refills | Status: DC

## 2016-08-05 NOTE — Telephone Encounter (Signed)
Patient called back.  Gave Charlotte's response on labs.  Patient would like to know if there will be a change on any of her medications. Also, patient states she gave Baldo Ash a exam form to complete for her.  She would like to know status on this.

## 2016-08-05 NOTE — Telephone Encounter (Signed)
Paper work is completed, at [employer]. Need to inform pt as well

## 2016-08-05 NOTE — Telephone Encounter (Signed)
Left vm for pt to cal back

## 2016-08-05 NOTE — Telephone Encounter (Signed)
No change in medication per charlotte.

## 2016-08-06 NOTE — Telephone Encounter (Signed)
Left detail massage inform pt about medication and form is up front for pt to pick up.

## 2016-08-23 ENCOUNTER — Ambulatory Visit (INDEPENDENT_AMBULATORY_CARE_PROVIDER_SITE_OTHER): Payer: 59 | Admitting: Gastroenterology

## 2016-08-23 ENCOUNTER — Encounter: Payer: Self-pay | Admitting: Gastroenterology

## 2016-08-23 VITALS — BP 98/68 | HR 80 | Ht 64.0 in | Wt 339.2 lb

## 2016-08-23 DIAGNOSIS — Z1211 Encounter for screening for malignant neoplasm of colon: Secondary | ICD-10-CM | POA: Diagnosis not present

## 2016-08-23 DIAGNOSIS — K625 Hemorrhage of anus and rectum: Secondary | ICD-10-CM | POA: Diagnosis not present

## 2016-08-23 MED ORDER — NA SULFATE-K SULFATE-MG SULF 17.5-3.13-1.6 GM/177ML PO SOLN: ORAL | 0 refills | Status: DC

## 2016-08-23 NOTE — Patient Instructions (Addendum)
You have been scheduled for a colonoscopy. Please follow written instructions given to you at your visit today.  Please pick up your prep supplies at the pharmacy within the next 1-3 days. If you use inhalers (even only as needed), please bring them with you on the day of your procedure. Your physician has requested that you go to www.startemmi.com and enter the access code given to you at your visit today. This web site gives a general overview about your procedure. However, you should still follow specific instructions given to you by our office regarding your preparation for the procedure.  If you are age 60 or older, your body mass index should be between 23-30. Your Body mass index is 58.23 kg/m. If this is out of the aforementioned range listed, please consider follow up with your Primary Care Provider.  If you are age 72 or younger, your body mass index should be between 19-25. Your Body mass index is 58.23 kg/m. If this is out of the aformentioned range listed, please consider follow up with your Primary Care Provider.   Please purchase the following medications over the counter and take as directed: Daily Fiber Supplement

## 2016-08-23 NOTE — Progress Notes (Signed)
HPI :  56 y/o female with a history of morbid obesity, DM, HTN, HLD, here for a new patient consultation for Dr. [name] for rectal bleeding and colon cancer screening.   No prior colonoscopy. No FH of colon cancer.   She thinks she has had blood in her stool in the past, she suspects due to hemorrhoids. She reports rare cases of passing blood in her stools over time, this does not happen frequently. She has regular bowel movements, once every day. She denies any constipation or straining. No diarrhea at baseline. She has had some discomfort in her rectum at times - she reports it is rare, does not bother her routinely. She denies any pain around the time of a bowel movement, no clear triggers to her pain. She thinks that has been ongoing for years and is chronic. She has morbid obesity, stating she has been on phenteramine in the past. She denies any issues with anesthesia previously.    Past Medical History:  Diagnosis Date  . Arthritis   . Diabetes mellitus without complication ([place])   . Hyperlipidemia   . Hypertension   . Obesity      Past Surgical History:  Procedure Laterality Date  . ABDOMINAL HYSTERECTOMY    . CHOLECYSTECTOMY    . DENTAL SURGERY    . ORIF TIBIA & FIBULA FRACTURES Left    Family History  Problem Relation Age of Onset  . Diabetes Mother   . Stroke Mother   . Dementia Mother   . Hypertension Mother   . Pancreatic cancer Father   . Colon polyps Sister   . Cancer Maternal Grandmother        type unknown   Social History  Substance Use Topics  . Smoking status: Former Smoker    Quit date: 02/12/2008  . Smokeless tobacco: Never Used  . Alcohol use No   Current Outpatient Prescriptions  Medication Sig Dispense Refill  . atenolol (TENORMIN) 50 MG tablet Take 1 tablet (50 mg total) by mouth daily. 90 tablet 1  . glipiZIDE (GLUCOTROL) 10 MG tablet Take 1 tablet (10 mg total) by mouth daily before breakfast. 90 tablet 1  . hydrochlorothiazide  (HYDRODIURIL) 25 MG tablet Take 1 tablet (25 mg total) by mouth daily. 90 tablet 1  . lisinopril (PRINIVIL,ZESTRIL) 10 MG tablet Take 1 tablet (10 mg total) by mouth daily. 90 tablet 1  . lovastatin (MEVACOR) 20 MG tablet Take 1 tablet (20 mg total) by mouth at bedtime. 90 tablet 1  . metFORMIN (GLUCOPHAGE) 500 MG tablet Take 1 tablet (500 mg total) by mouth 2 (two) times daily with a meal. 180 tablet 1  . methocarbamol (ROBAXIN) 500 MG tablet Take 1 tablet (500 mg total) by mouth every 8 (eight) hours as needed for muscle spasms. 30 tablet 0   No current facility-administered medications for this visit.    No Known Allergies   Review of Systems: All systems reviewed and negative except where noted in HPI.   Lab Results  Component Value Date   WBC 9.1 08/02/2016   HGB 13.4 08/02/2016   HCT 41.0 08/02/2016   MCV 89.8 08/02/2016   PLT 282.0 08/02/2016    Lab Results  Component Value Date   CREATININE 0.68 08/02/2016   BUN 15 08/02/2016   NA 142 08/02/2016   K 4.0 08/02/2016   CL 103 08/02/2016   CO2 31 08/02/2016    Lab Results  Component Value Date   ALT 20 08/02/2016  AST 18 08/02/2016   ALKPHOS 65 08/02/2016   BILITOT 0.3 08/02/2016     Physical Exam: BP 98/68 (BP Location: Left Wrist, Patient Position: Sitting, Cuff Size: Normal)   Pulse 80   Ht 5\' 4"  (1.626 m) Comment: height measured without shoes  Wt (!) 339 lb 4 oz (153.9 kg)   BMI 58.23 kg/m  Constitutional: Pleasant, female in no acute distress. HEENT: Normocephalic and atraumatic. Conjunctivae are normal. No scleral icterus. Neck supple.  Cardiovascular: Normal rate, regular rhythm.  Pulmonary/chest: Effort normal and breath sounds normal. No wheezing, rales or rhonchi. Abdominal: Soft, obese / protuberant, nontender. There are no masses palpable. No hepatomegaly. Extremities: no edema Lymphadenopathy: No cervical adenopathy noted. Neurological: Alert and oriented to person place and time. Skin:  Skin is warm and dry. No rashes noted. Psychiatric: Normal mood and affect. Behavior is normal.   ASSESSMENT AND PLAN: 56 year old female morbid obesity presenting with rare rectal bleeding which is long-standing as well as need for colon cancer screening. She has no anemia.  Given her rare rectal bleeding, which may likely be due to hemorrhoids, she is not a candidate for stool based tasting it is recommended to proceed with optical colonoscopy for screening purposes and to evaluate her rectal bleeding. I discussed risks and benefits of endoscopy and anesthesia with her. Given her obesity she is at higher than average risk for anesthesia related complications and her case must be done at hospital. In the interim should use daily fiber supplement empirically treat for hemorrhoids.  The patient agreed the plan, questions answered.  Hamburg Cellar, MD [facility] Pager [phone]  CC: Lorayne Marek Charlene Brooke, NP

## 2016-08-26 ENCOUNTER — Telehealth: Payer: Self-pay | Admitting: Nurse Practitioner

## 2016-08-26 NOTE — Telephone Encounter (Signed)
Patient called. She is going to call the office and see if she can get this information. Otherwise she is going to have a release of records filled out to get this.   She also had a question about her medication. She thought that it had been recalled. So if it was she wanted to know why she was still on it.  metFORMIN (GLUCOPHAGE) 500 MG tablet

## 2016-08-26 NOTE — Telephone Encounter (Signed)
Received FMLA paperwork to be completed that was left in my box. Spoke with [name] about forms. We are needing the records from [place]. Patient will need to bring records or come fill out a release so that we can get this information.   Called to speak with patient, no answer - LVM to call back.

## 2016-08-26 NOTE — Telephone Encounter (Signed)
Left vm for pt to callback 

## 2016-08-29 NOTE — Telephone Encounter (Signed)
Left vm for pt to return call  

## 2016-09-03 NOTE — Telephone Encounter (Signed)
Pt had someone return signed records release form to [place] so records can be obtained from [employer].  Release form placed in box for mail pick up.

## 2016-09-03 NOTE — Telephone Encounter (Signed)
Patient returned your call. You was in a room. Can you follow up with her. Thank you.

## 2016-09-03 NOTE — Telephone Encounter (Signed)
Spoke with pt, we are not aware of a recall on Metformin. She will check with her friend (source of this info) and she will call back if she has more information.

## 2016-09-05 ENCOUNTER — Telehealth: Payer: Self-pay | Admitting: Nurse Practitioner

## 2016-09-05 NOTE — Telephone Encounter (Signed)
ROI faxed to [facility]

## 2016-09-11 ENCOUNTER — Telehealth: Payer: Self-pay | Admitting: Nurse Practitioner

## 2016-09-11 DIAGNOSIS — I10 Essential (primary) hypertension: Secondary | ICD-10-CM

## 2016-09-11 DIAGNOSIS — E118 Type 2 diabetes mellitus with unspecified complications: Secondary | ICD-10-CM

## 2016-09-11 MED ORDER — ATENOLOL 50 MG PO TABS: 50.0000 mg | ORAL_TABLET | Freq: Every day | ORAL | 0 refills | Status: DC

## 2016-09-11 MED ORDER — HYDROCHLOROTHIAZIDE 25 MG PO TABS: 25.0000 mg | ORAL_TABLET | Freq: Every day | ORAL | 0 refills | Status: DC

## 2016-09-11 MED ORDER — LISINOPRIL 10 MG PO TABS: 10.0000 mg | ORAL_TABLET | Freq: Every day | ORAL | 0 refills | Status: DC

## 2016-09-11 NOTE — Telephone Encounter (Signed)
Pt called requesting a one week supply of Lisinopril, hydrochlorothiazide and atenolol to be sent to [facility] on [address] while she is waiting on her prescription from her mail order pharmacy. Please advise.

## 2016-09-11 NOTE — Telephone Encounter (Signed)
Notified pt rx's sent to [facility]...Ruth [name]

## 2016-09-20 NOTE — Telephone Encounter (Signed)
Rec'd from [place] forwarded 16 pages to Justice NP

## 2016-09-26 ENCOUNTER — Telehealth: Payer: Self-pay | Admitting: Nurse Practitioner

## 2016-09-26 NOTE — Telephone Encounter (Signed)
Pt called asking if her FMLA paperwork has been completed, I told her we reciveed the records from [facility] but it is not done yet, she would like a call back when done

## 2016-09-27 NOTE — Telephone Encounter (Signed)
FMLA forms have been placed on Ruth [name]'s desk to advise on.

## 2016-09-30 NOTE — Telephone Encounter (Signed)
Pt called checking on this. I told her that it was completed this morning and would be finalized soon. She asked if it could be sent over to her employer. If you have questions, she can be reached at [phone].

## 2016-09-30 NOTE — Telephone Encounter (Signed)
Form completed. Forwarded to [place]

## 2016-10-01 DIAGNOSIS — Z0279 Encounter for issue of other medical certificate: Secondary | ICD-10-CM

## 2016-10-01 NOTE — Telephone Encounter (Signed)
Forms are completed &signed, Faxed 8/21, Copy sent to scan, Original up front for patient to pick up. - LVM to inform patient. &Charged for.

## 2016-10-08 ENCOUNTER — Other Ambulatory Visit: Payer: Self-pay

## 2016-10-08 DIAGNOSIS — Z1211 Encounter for screening for malignant neoplasm of colon: Secondary | ICD-10-CM

## 2016-10-09 ENCOUNTER — Telehealth: Payer: Self-pay | Admitting: Gastroenterology

## 2016-10-09 MED ORDER — NA SULFATE-K SULFATE-MG SULF 17.5-3.13-1.6 GM/177ML PO SOLN: ORAL | 0 refills | Status: DC

## 2016-10-09 NOTE — Telephone Encounter (Signed)
Rx resent.

## 2016-10-09 NOTE — Telephone Encounter (Signed)
Patient states she needs her prep sent back in to [place] for procedure at [facility] on 10/29/16. Pt had ov 08/23/16.

## 2016-10-15 ENCOUNTER — Encounter (HOSPITAL_COMMUNITY): Payer: Self-pay | Admitting: *Deleted

## 2016-10-29 ENCOUNTER — Ambulatory Visit (HOSPITAL_COMMUNITY): Payer: 59 | Admitting: Anesthesiology

## 2016-10-29 ENCOUNTER — Encounter (HOSPITAL_COMMUNITY)
Admission: RE | Disposition: A | Discharge status: Home / Self Care | Payer: Self-pay | Source: Ambulatory Visit | Attending: Gastroenterology

## 2016-10-29 ENCOUNTER — Encounter (HOSPITAL_COMMUNITY): Payer: Self-pay

## 2016-10-29 ENCOUNTER — Ambulatory Visit (HOSPITAL_COMMUNITY)
Admission: RE | Admit: 2016-10-29 | Discharge: 2016-10-29 | Disposition: A | Discharge status: Home / Self Care | Payer: 59 | Source: Ambulatory Visit | Attending: Gastroenterology | Admitting: Gastroenterology

## 2016-10-29 DIAGNOSIS — K921 Melena: Secondary | ICD-10-CM | POA: Diagnosis not present

## 2016-10-29 DIAGNOSIS — K5730 Diverticulosis of large intestine without perforation or abscess without bleeding: Secondary | ICD-10-CM | POA: Diagnosis not present

## 2016-10-29 DIAGNOSIS — Z1211 Encounter for screening for malignant neoplasm of colon: Secondary | ICD-10-CM | POA: Insufficient documentation

## 2016-10-29 DIAGNOSIS — Z6841 Body Mass Index (BMI) 40.0 and over, adult: Secondary | ICD-10-CM | POA: Diagnosis not present

## 2016-10-29 DIAGNOSIS — Z8371 Family history of colonic polyps: Secondary | ICD-10-CM | POA: Insufficient documentation

## 2016-10-29 DIAGNOSIS — E785 Hyperlipidemia, unspecified: Secondary | ICD-10-CM | POA: Diagnosis not present

## 2016-10-29 DIAGNOSIS — E119 Type 2 diabetes mellitus without complications: Secondary | ICD-10-CM | POA: Diagnosis not present

## 2016-10-29 DIAGNOSIS — D128 Benign neoplasm of rectum: Secondary | ICD-10-CM | POA: Insufficient documentation

## 2016-10-29 DIAGNOSIS — I10 Essential (primary) hypertension: Secondary | ICD-10-CM | POA: Diagnosis not present

## 2016-10-29 DIAGNOSIS — D120 Benign neoplasm of cecum: Secondary | ICD-10-CM | POA: Diagnosis not present

## 2016-10-29 DIAGNOSIS — Z87891 Personal history of nicotine dependence: Secondary | ICD-10-CM | POA: Insufficient documentation

## 2016-10-29 DIAGNOSIS — Z79899 Other long term (current) drug therapy: Secondary | ICD-10-CM | POA: Insufficient documentation

## 2016-10-29 DIAGNOSIS — Z7984 Long term (current) use of oral hypoglycemic drugs: Secondary | ICD-10-CM | POA: Diagnosis not present

## 2016-10-29 DIAGNOSIS — D126 Benign neoplasm of colon, unspecified: Secondary | ICD-10-CM

## 2016-10-29 DIAGNOSIS — D123 Benign neoplasm of transverse colon: Secondary | ICD-10-CM | POA: Diagnosis not present

## 2016-10-29 DIAGNOSIS — D124 Benign neoplasm of descending colon: Secondary | ICD-10-CM

## 2016-10-29 HISTORY — PX: COLONOSCOPY WITH PROPOFOL: SHX5780

## 2016-10-29 HISTORY — DX: Anemia, unspecified: D64.9

## 2016-10-29 LAB — GLUCOSE, CAPILLARY: Glucose-Capillary: 187 mg/dL — ABNORMAL HIGH (ref 65–99)

## 2016-10-29 SURGERY — COLONOSCOPY WITH PROPOFOL
Anesthesia: Monitor Anesthesia Care

## 2016-10-29 MED ORDER — PROPOFOL 10 MG/ML IV BOLUS
INTRAVENOUS | Status: AC
  Filled 2016-10-29: qty 20

## 2016-10-29 MED ORDER — LACTATED RINGERS IV SOLN
INTRAVENOUS | Status: DC
  Administered 2016-10-29: 1000 mL via INTRAVENOUS

## 2016-10-29 MED ORDER — PROPOFOL 10 MG/ML IV BOLUS
INTRAVENOUS | Status: AC
  Filled 2016-10-29: qty 40

## 2016-10-29 MED ORDER — PHENYLEPHRINE 40 MCG/ML (10ML) SYRINGE FOR IV PUSH (FOR BLOOD PRESSURE SUPPORT)
PREFILLED_SYRINGE | INTRAVENOUS | Status: AC
Start: 2016-10-29 — End: 2016-10-29
  Filled 2016-10-29: qty 10

## 2016-10-29 MED ORDER — SODIUM CHLORIDE 0.9 % IV SOLN: INTRAVENOUS | Status: DC

## 2016-10-29 MED ORDER — PROPOFOL 500 MG/50ML IV EMUL
INTRAVENOUS | Status: DC | PRN
  Administered 2016-10-29: 40 mg via INTRAVENOUS
  Administered 2016-10-29: 10 mg via INTRAVENOUS
  Administered 2016-10-29: 20 mg via INTRAVENOUS

## 2016-10-29 MED ORDER — PHENYLEPHRINE HCL 10 MG/ML IJ SOLN
INTRAMUSCULAR | Status: DC | PRN
  Administered 2016-10-29: 80 ug via INTRAVENOUS

## 2016-10-29 MED ORDER — PROPOFOL 500 MG/50ML IV EMUL
INTRAVENOUS | Status: DC | PRN
  Administered 2016-10-29: 100 ug/kg/min via INTRAVENOUS

## 2016-10-29 SURGICAL SUPPLY — 21 items

## 2016-10-29 NOTE — Anesthesia Postprocedure Evaluation (Signed)
Anesthesia Post Note  Patient: Harvey Lingo Mullenbach  Procedure(s) Performed: Procedure(s) (LRB): COLONOSCOPY WITH PROPOFOL (N/A)     Patient location during evaluation: PACU Anesthesia Type: MAC Level of consciousness: awake and alert Pain management: pain level controlled Vital Signs Assessment: post-procedure vital signs reviewed and stable Respiratory status: spontaneous breathing, nonlabored ventilation and respiratory function stable Cardiovascular status: stable and blood pressure returned to baseline Postop Assessment: no apparent nausea or vomiting Anesthetic complications: no    Last Vitals:  Vitals:   10/29/16 0915 10/29/16 0920  BP:  111/77  Pulse:  64  Resp:  13  Temp:    SpO2: 97% 97%    Last Pain:  Vitals:   10/29/16 0902  TempSrc: Oral                 Lynda Rainwater

## 2016-10-29 NOTE — Interval H&P Note (Signed)
History and Physical Interval Note:  10/29/2016 8:08 AM  Ruth [name]  has presented today for surgery, with the diagnosis of rectal bleeding, screening colon  The various methods of treatment have been discussed with the patient and family. After consideration of risks, benefits and other options for treatment, the patient has consented to  Procedure(s): COLONOSCOPY WITH PROPOFOL (N/A) as a surgical intervention .  The patient's history has been reviewed, patient examined, no change in status, stable for surgery.  I have reviewed the patient's chart and labs.  Questions were answered to the patient's satisfaction.     [place]

## 2016-10-29 NOTE — Anesthesia Preprocedure Evaluation (Addendum)
Anesthesia Evaluation  Patient identified by MRN, date of birth, ID band Patient awake    Reviewed: Allergy & Precautions, NPO status , Patient's Chart, lab work & pertinent test results, reviewed documented beta blocker date and time   Airway Mallampati: II  TM Distance: >3 FB Neck ROM: Full    Dental no notable dental hx.    Pulmonary neg pulmonary ROS, former smoker,    Pulmonary exam normal breath sounds clear to auscultation       Cardiovascular hypertension, Pt. on medications and Pt. on home beta blockers negative cardio ROS Normal cardiovascular exam Rhythm:Regular Rate:Normal     Neuro/Psych negative neurological ROS  negative psych ROS   GI/Hepatic negative GI ROS, Neg liver ROS,   Endo/Other  negative endocrine ROSdiabetes, Type 2, Oral Hypoglycemic AgentsMorbid obesity  Renal/GU negative Renal ROS  negative genitourinary   Musculoskeletal negative musculoskeletal ROS (+) Arthritis ,   Abdominal   Peds negative pediatric ROS (+)  Hematology negative hematology ROS (+)   Anesthesia Other Findings   Reproductive/Obstetrics negative OB ROS                             Anesthesia Physical Anesthesia Plan  ASA: III  Anesthesia Plan: MAC   Post-op Pain Management:    Induction: Intravenous  PONV Risk Score and Plan: 2 and Ondansetron, Treatment may vary due to age or medical condition and Midazolam  Airway Management Planned: Simple Face Mask  Additional Equipment:   Intra-op Plan:   Post-operative Plan: Extubation in OR  Informed Consent: I have reviewed the patients History and Physical, chart, labs and discussed the procedure including the risks, benefits and alternatives for the proposed anesthesia with the patient or authorized representative who has indicated his/her understanding and acceptance.   Dental advisory given  Plan Discussed with: CRNA  Anesthesia  Plan Comments:         Anesthesia Quick Evaluation

## 2016-10-29 NOTE — Transfer of Care (Signed)
Immediate Anesthesia Transfer of Care Note  Patient: Ruth [name]  Procedure(s) Performed: Procedure(s): COLONOSCOPY WITH PROPOFOL (N/A)  Patient Location: PACU  Anesthesia Type:MAC  Level of Consciousness: awake, alert  and oriented  Airway & Oxygen Therapy: Patient Spontanous Breathing and Patient connected to face mask oxygen  Post-op Assessment: Report given to RN and Post -op Vital signs reviewed and stable  Post vital signs: Reviewed and stable  Last Vitals:  Vitals:   10/29/16 0736  BP: 100/61  Pulse: 72  Resp: 15  Temp: 36.7 C  SpO2: 97%    Last Pain:  Vitals:   10/29/16 0736  TempSrc: Oral         Complications: No apparent anesthesia complications

## 2016-10-29 NOTE — H&P (Signed)
HPI :  56 y/o female with morbid obesity here for first time colonoscopy. She has a history of rectal bleeding in the past. Case being done in the hospital given her weight, for anesthesia support.  Past Medical History:  Diagnosis Date  . Anemia   . Arthritis   . Diabetes mellitus without complication ([place])    type 2  . Hyperlipidemia   . Hypertension   . Obesity      Past Surgical History:  Procedure Laterality Date  . ABDOMINAL HYSTERECTOMY     partial  . CHOLECYSTECTOMY    . DENTAL SURGERY    . ORIF TIBIA & FIBULA FRACTURES Left    Family History  Problem Relation Age of Onset  . Diabetes Mother   . Stroke Mother   . Dementia Mother   . Hypertension Mother   . Pancreatic cancer Father   . Colon polyps Sister   . Cancer Maternal Grandmother        type unknown   Social History  Substance Use Topics  . Smoking status: Former Smoker    Packs/day: 1.00    Years: 20.00    Quit date: 02/12/2008  . Smokeless tobacco: Never Used  . Alcohol use No   Current Facility-Administered Medications  Medication Dose Route Frequency Provider Last Rate Last Dose  . 0.9 %  sodium chloride infusion   Intravenous Continuous Armbruster, Carlota Raspberry, MD      . lactated ringers infusion   Intravenous Continuous Armbruster, Carlota Raspberry, MD 10 mL/hr at 10/29/16 0755 1,000 mL at 10/29/16 0755   Outpatient medications listed in Epic reviewed. Current Outpatient Prescriptions  Medication Sig Dispense Refill  . atenolol (TENORMIN) 50 MG tablet Take 1 tablet (50 mg total) by mouth daily. 90 tablet 1  . glipiZIDE (GLUCOTROL) 10 MG tablet Take 1 tablet (10 mg total) by mouth daily before breakfast. 90 tablet 1  . hydrochlorothiazide (HYDRODIURIL) 25 MG tablet Take 1 tablet (25 mg total) by mouth daily. 90 tablet 1  . lisinopril (PRINIVIL,ZESTRIL) 10 MG tablet Take 1 tablet (10 mg total) by mouth daily. 90 tablet 1  . lovastatin (MEVACOR) 20 MG tablet Take 1 tablet (20 mg total) by mouth at  bedtime. 90 tablet 1  . metFORMIN (GLUCOPHAGE) 500 MG tablet Take 1 tablet (500 mg total) by mouth 2 (two) times daily with a meal. 180 tablet 1  . methocarbamol (ROBAXIN) 500 MG tablet Take 1 tablet (500 mg total) by mouth every 8 (eight) hours as needed for muscle spasms. 30 tablet 0     Allergies  Allergen Reactions  . Tape Other (See Comments)    Certain Band aids cause skin irritation     Review of Systems: All systems reviewed and negative except where noted in HPI.   Lab Results  Component Value Date   WBC 9.1 08/02/2016   HGB 13.4 08/02/2016   HCT 41.0 08/02/2016   MCV 89.8 08/02/2016   PLT 282.0 08/02/2016    Lab Results  Component Value Date   CREATININE 0.68 08/02/2016   BUN 15 08/02/2016   NA 142 08/02/2016   K 4.0 08/02/2016   CL 103 08/02/2016   CO2 31 08/02/2016    Lab Results  Component Value Date   ALT 20 08/02/2016   AST 18 08/02/2016   ALKPHOS 65 08/02/2016   BILITOT 0.3 08/02/2016     Physical Exam: BP 100/61   Pulse 72   Temp 98.1 F (36.7 C) (Oral)  Resp 15   Ht 5\' 4"  (1.626 m)   Wt (!) 339 lb (153.8 kg)   SpO2 97%   BMI 58.19 kg/m  Constitutional: Pleasant,well-developed, female in no acute distress. HEENT: Normocephalic and atraumatic. Conjunctivae are normal. No scleral icterus. Neck supple.  Cardiovascular: Normal rate, regular rhythm.  Pulmonary/chest: Effort normal and breath sounds normal. No wheezing, rales or rhonchi. Abdominal: Soft, protuberant, nontender. There are no masses palpable.  Extremities: no edema Lymphadenopathy: No cervical adenopathy noted. Neurological: Alert and oriented to person place and time. Skin: Skin is warm and dry. No rashes noted. Psychiatric: Normal mood and affect. Behavior is normal.   ASSESSMENT AND PLAN: 56 y/o female with a history of morbid obesity, here for first time colonoscopy for screening purposes and for history of rectal bleeding. I have discussed risks / benefits of  anesthesia and colonoscopy with the patient, she wishes to proceed.  Brentford Cellar, MD [facility] Pager [phone]

## 2016-10-29 NOTE — Anesthesia Procedure Notes (Signed)
Date/Time: 10/29/2016 8:14 AM Performed by: Glory Buff Oxygen Delivery Method: Simple face mask

## 2016-10-29 NOTE — Op Note (Signed)
[facility] Patient Name: Ruth [name] Procedure Date: 10/29/2016 MRN: [id number] Attending MD: Carlota Raspberry. Ruth Belardo MD, MD Date of Birth: 06/22/1960 CSN: [id number] Age: 56 Admit Type: Inpatient Procedure:                Colonoscopy Indications:              This is the patient's first colonoscopy, history of                            Hematochezia, screening Providers:                Remo Lipps P. Coty Student MD, MD, Cleda Daub, RN,                            Cherylynn Ridges, Technician, Rosario Adie, CRNA Referring MD:              Medicines:                Monitored Anesthesia Care Complications:            No immediate complications. Estimated blood loss:                            Minimal. Estimated Blood Loss:     Estimated blood loss was minimal. Procedure:                Pre-Anesthesia Assessment:                           - Prior to the procedure, a History and Physical                            was performed, and patient medications and                            allergies were reviewed. The patient's tolerance of                            previous anesthesia was also reviewed. The risks                            and benefits of the procedure and the sedation                            options and risks were discussed with the patient.                            All questions were answered, and informed consent                            was obtained. Prior Anticoagulants: The patient has                            taken no previous anticoagulant or antiplatelet  agents. ASA Grade Assessment: III - A patient with                            severe systemic disease. After reviewing the risks                            and benefits, the patient was deemed in                            satisfactory condition to undergo the procedure.                           After obtaining informed consent, the colonoscope     was passed under direct vision. Throughout the                            procedure, the patient's blood pressure, pulse, and                            oxygen saturations were monitored continuously. The                            EC-[id number] ([id number]) scope was introduced through                            the anus and advanced to the the cecum, identified                            by appendiceal orifice and ileocecal valve. The                            colonoscopy was performed without difficulty. The                            patient tolerated the procedure well. The quality                            of the bowel preparation was good. The ileocecal                            valve, appendiceal orifice, and rectum were                            photographed. Scope In: 8:23:06 AM Scope Out: 8:50:51 AM Scope Withdrawal Time: 0 hours 24 minutes 17 seconds  Total Procedure Duration: 0 hours 27 minutes 45 seconds  Findings:      The perianal and digital rectal examinations were normal.      Scattered medium-mouthed diverticula were found in the left colon and       right colon.      A 10 mm polyp was found in the ileocecal valve. The polyp was flat. The       polyp was removed with a cold snare. Resection and retrieval were       complete.  A 4 mm polyp was found in the transverse colon. The polyp was sessile.       The polyp was removed with a cold snare. Resection and retrieval were       complete.      A 5 mm polyp was found in the descending colon. The polyp was sessile.       The polyp was removed with a cold snare. Resection and retrieval were       complete.      A 8 mm polyp was found in the rectum. The polyp was semi-pedunculated.       The polyp was removed with a hot snare. Resection and retrieval were       complete.      Internal hemorrhoids were found during retroflexion.      The exam was otherwise without abnormality. Impression:               - Diverticulosis  in the left colon and in the right                            colon.                           - One 10 mm polyp at the ileocecal valve, removed                            with a cold snare. Resected and retrieved.                           - One 4 mm polyp in the transverse colon, removed                            with a cold snare. Resected and retrieved.                           - One 5 mm polyp in the descending colon, removed                            with a cold snare. Resected and retrieved.                           - One 8 mm polyp in the rectum, removed with a hot                            snare. Resected and retrieved.                           - Internal hemorrhoids.                           - The examination was otherwise normal. Moderate Sedation:      No moderate sedation, case performed with MAC Recommendation:           - Patient has a contact number available for  emergencies. The signs and symptoms of potential                            delayed complications were discussed with the                            patient. Return to normal activities tomorrow.                            Written discharge instructions were provided to the                            patient.                           - Resume previous diet.                           - Continue present medications.                           - Await pathology results.                           - Repeat colonoscopy is recommended for                            surveillance. The colonoscopy date will be                            determined after pathology results from today's                            exam become available for review.                           - No ibuprofen, naproxen, or other non-steroidal                            anti-inflammatory drugs for 2 weeks after polyp                            removal. Procedure Code(s):        --- Professional ---                            [phone], Colonoscopy, flexible; with removal of                            tumor(s), polyp(s), or other lesion(s) by snare                            technique Diagnosis Code(s):        --- Professional ---                           G01.7, Other hemorrhoids  D12.0, Benign neoplasm of cecum                           D12.3, Benign neoplasm of transverse colon (hepatic                            flexure or splenic flexure)                           D12.4, Benign neoplasm of descending colon                           K62.1, Rectal polyp                           K92.1, Melena (includes Hematochezia)                           K57.30, Diverticulosis of large intestine without                            perforation or abscess without bleeding CPT copyright 2016 American Medical Association. All rights reserved. The codes documented in this report are preliminary and upon coder review may  be revised to meet current compliance requirements. Remo Lipps P. Morena Mckissack MD, MD 10/29/2016 8:56:25 AM This report has been signed electronically. Number of Addenda: 0

## 2016-10-29 NOTE — Discharge Instructions (Signed)
YOU HAD AN ENDOSCOPIC PROCEDURE TODAY: Refer to the procedure report and other information in the discharge instructions given to you for any specific questions about what was found during the examination. If this information does not answer your questions, please call [place] office at [phone] to clarify.  ° °YOU SHOULD EXPECT: Some feelings of bloating in the abdomen. Passage of more gas than usual. Walking can help get rid of the air that was put into your GI tract during the procedure and reduce the bloating. If you had a lower endoscopy (such as a colonoscopy or flexible sigmoidoscopy) you may notice spotting of blood in your stool or on the toilet paper. Some abdominal soreness may be present for a day or two, also. ° °DIET: Your first meal following the procedure should be a light meal and then it is ok to progress to your normal diet. A half-sandwich or bowl of soup is an example of a good first meal. Heavy or fried foods are harder to digest and may make you feel nauseous or bloated. Drink plenty of fluids but you should avoid alcoholic beverages for 24 hours. If you had a esophageal dilation, please see attached instructions for diet.   ° °ACTIVITY: Your care partner should take you home directly after the procedure. You should plan to take it easy, moving slowly for the rest of the day. You can resume normal activity the day after the procedure however YOU SHOULD NOT DRIVE, use power tools, machinery or perform tasks that involve climbing or major physical exertion for 24 hours (because of the sedation medicines used during the test).  ° °SYMPTOMS TO REPORT IMMEDIATELY: °A gastroenterologist can be reached at any hour. Please call [phone]  for any of the following symptoms:  °Following lower endoscopy (colonoscopy, flexible sigmoidoscopy) °Excessive amounts of blood in the stool  °Significant tenderness, worsening of abdominal pains  °Swelling of the abdomen that is new, acute  °Fever of 100° or  higher  °Following upper endoscopy (EGD, EUS, ERCP, esophageal dilation) °Vomiting of blood or coffee ground material  °New, significant abdominal pain  °New, significant chest pain or pain under the shoulder blades  °Painful or persistently difficult swallowing  °New shortness of breath  °Black, tarry-looking or red, bloody stools ° °FOLLOW UP:  °If any biopsies were taken you will be contacted by phone or by letter within the next 1-3 weeks. Call [phone]  if you have not heard about the biopsies in 3 weeks.  °Please also call with any specific questions about appointments or follow up tests. ° °

## 2016-10-30 ENCOUNTER — Encounter (HOSPITAL_COMMUNITY): Payer: Self-pay | Admitting: Gastroenterology

## 2016-11-02 ENCOUNTER — Encounter: Payer: Self-pay | Admitting: Gastroenterology

## 2017-04-08 ENCOUNTER — Ambulatory Visit: Payer: BLUE CROSS/BLUE SHIELD | Admitting: Nurse Practitioner

## 2017-04-08 ENCOUNTER — Encounter: Payer: Self-pay | Admitting: Nurse Practitioner

## 2017-04-08 VITALS — BP 118/80 | HR 76 | Temp 98.1°F | Wt 327.8 lb

## 2017-04-08 DIAGNOSIS — L02412 Cutaneous abscess of left axilla: Secondary | ICD-10-CM

## 2017-04-08 MED ORDER — SULFAMETHOXAZOLE-TRIMETHOPRIM 800-160 MG PO TABS: 1.0000 | ORAL_TABLET | Freq: Two times a day (BID) | ORAL | 0 refills | Status: DC

## 2017-04-08 NOTE — Progress Notes (Signed)
Subjective:  Patient ID: Ruth [name], female    DOB: [birth year]  Age: 57 y.o. MRN: [id number]  CC: Recurrent Skin Infections (has a odor/boil occurred 3 wks ago/ drainage is milky white)  Rash  This is a new problem. The current episode started 1 to 4 weeks ago. The problem has been gradually worsening since onset. The affected locations include the left axilla. The rash is characterized by pain, redness and swelling. She was exposed to nothing. Pertinent negatives include no fatigue, fever or joint pain. Past treatments include nothing.   Outpatient Medications Prior to Visit  Medication Sig Dispense Refill  . atenolol (TENORMIN) 50 MG tablet Take 1 tablet (50 mg total) by mouth daily. 7 tablet 0  . glipiZIDE (GLUCOTROL) 10 MG tablet Take 1 tablet (10 mg total) by mouth daily before breakfast. (Patient taking differently: Take 10 mg by mouth See admin instructions. Pt takes 1 dose in the morning, and if blood sugar is high in the evening, pt can take another dose) 90 tablet 1  . hydrochlorothiazide (HYDRODIURIL) 25 MG tablet Take 1 tablet (25 mg total) by mouth daily. 7 tablet 0  . lisinopril (PRINIVIL,ZESTRIL) 10 MG tablet Take 1 tablet (10 mg total) by mouth daily. 7 tablet 0  . lovastatin (MEVACOR) 20 MG tablet Take 1 tablet (20 mg total) by mouth at bedtime. (Patient taking differently: Take 20 mg by mouth every morning. ) 90 tablet 1  . metFORMIN (GLUCOPHAGE) 500 MG tablet Take 1 tablet (500 mg total) by mouth 2 (two) times daily with a meal. (Patient taking differently: Take 500 mg by mouth See admin instructions. Pt takes 1 dose in the morning, and if blood sugar is high in the evening, pt can take another dose) 180 tablet 1  . Multiple Vitamins-Minerals (MULTIVITAMIN WITH MINERALS) tablet Take 1 tablet by mouth daily.     No facility-administered medications prior to visit.     ROS See HPI  Objective:  BP 118/80 (BP Location: Right Arm, Patient Position: Sitting, Cuff Size:  Large)   Pulse 76   Temp 98.1 F (36.7 C) (Oral)   Wt (!) 327 lb 12.8 oz (148.7 kg)   BMI 56.27 kg/m   BP Readings from Last 3 Encounters:  04/08/17 118/80  10/29/16 111/77  08/23/16 98/68    Wt Readings from Last 3 Encounters:  04/08/17 (!) 327 lb 12.8 oz (148.7 kg)  10/29/16 (!) 339 lb (153.8 kg)  08/23/16 (!) 339 lb 4 oz (153.9 kg)    Physical Exam  Constitutional: She is oriented to person, place, and time. No distress.  Cardiovascular: Normal rate.  Pulmonary/Chest: Effort normal.  Musculoskeletal: She exhibits tenderness. She exhibits no edema.  Neurological: She is alert and oriented to person, place, and time.  Skin: Rash noted. Rash is pustular. There is erythema.  Left axilla pustule: erythema, pain, induration and fluctuance.   Psychiatric: She has a normal mood and affect. Her behavior is normal.  Vitals reviewed.  Procedure note:  Incision and Drainage of an Abscess (left axilla)   Indication : a localized collection of pus that is tender and not spontaneously resolving.    Risks including unsuccessful procedure , possible need for a repeat procedure due to pus accumulation, scar formation, and others as well as benefits were explained to the patient in detail. Written consent was obtained/signed.    The patient was placed in a decubitus position. The area of an abscess on left axilla was prepped with povidone-iodine  and draped in a sterile fashion. Blister incised, scab removed. About 1 cc of purulent material was expressed.The cavity was cleaned.The wound was dressed with antibiotic ointment and a large bandaid.  Tolerated well. Complications: None.   Wound instructions provided.    Please contact us if you notice a recollection of pus in the abscess fever and chills increased pain redness red streaks near the abscess increased swelling in the area.   Lab Results  Component Value Date   WBC 9.1 08/02/2016   HGB 13.4 08/02/2016   HCT 41.0 08/02/2016     PLT 282.0 08/02/2016   GLUCOSE 87 08/02/2016   CHOL 176 08/02/2016   TRIG 110.0 08/02/2016   HDL 51.40 08/02/2016   LDLCALC 103 (H) 08/02/2016   ALT 20 08/02/2016   AST 18 08/02/2016   NA 142 08/02/2016   K 4.0 08/02/2016   CL 103 08/02/2016   CREATININE 0.68 08/02/2016   BUN 15 08/02/2016   CO2 31 08/02/2016   TSH 1.88 08/02/2016   HGBA1C 7.6 (H) 08/02/2016   MICROALBUR <0.7 08/02/2016    Assessment & Plan:   Ruth was seen today for recurrent skin infections.  Diagnoses and all orders for this visit:  Abscess of axilla, left -     sulfamethoxazole-trimethoprim (BACTRIM DS,SEPTRA DS) 800-160 MG tablet; Take 1 tablet by mouth 2 (two) times daily. With food   I am having Ruth [name]. [name] start on sulfamethoxazole-trimethoprim. I am also having her maintain her glipiZIDE, lovastatin, metFORMIN, lisinopril, hydrochlorothiazide, atenolol, and multivitamin with minerals.  Meds ordered this encounter  Medications  . sulfamethoxazole-trimethoprim (BACTRIM DS,SEPTRA DS) 800-160 MG tablet    Sig: Take 1 tablet by mouth 2 (two) times daily. With food    Dispense:  14 tablet    Refill:  0    Order Specific Question:   Supervising Provider    Answer:   Lucille Passy [[id number]]    Follow-up: Return if symptoms worsen or fail to improve.  [name], NP

## 2017-04-08 NOTE — Patient Instructions (Addendum)
Change dressing daily. Clean with warm water and antibacterial soap. Use warm compress 2-3times a day (15-18mins at a time)  May use naproxen and/or tylenol as directed on package for pain  Call Dr. Havery Moros to f/up about colonoscopy results: [phone].   Skin Abscess A skin abscess is an infected area on or under your skin that contains a collection of pus and other material. An abscess may also be called a furuncle, carbuncle, or boil. An abscess can occur in or on almost any part of your body. Some abscesses break open (rupture) on their own. Most continue to get worse unless they are treated. The infection can spread deeper into the body and eventually into your blood, which can make you feel ill. Treatment usually involves draining the abscess. What are the causes? An abscess occurs when germs, often bacteria, pass through your skin and cause an infection. This may be caused by:  A scrape or cut on your skin.  A puncture wound through your skin, including a needle injection.  Blocked oil or sweat glands.  Blocked and infected hair follicles.  A cyst that forms beneath your skin (sebaceous cyst) and becomes infected.  What increases the risk? This condition is more likely to develop in people who:  Have a weak body defense system (immune system).  Have diabetes.  Have dry and irritated skin.  Get frequent injections or use illegal IV drugs.  Have a foreign body in a wound, such as a splinter.  Have problems with their lymph system or veins.  What are the signs or symptoms? An abscess may start as a painful, firm bump under the skin. Over time, the abscess may get larger or become softer. Pus may appear at the top of the abscess, causing pressure and pain. It may eventually break through the skin and drain. Other symptoms include:  Redness.  Warmth.  Swelling.  Tenderness.  A sore on the skin.  How is this diagnosed? This condition is diagnosed based on  your medical history and a physical exam. A sample of pus may be taken from the abscess to find out what is causing the infection and what antibiotics can be used to treat it. You also may have:  Blood tests to look for signs of infection or spread of an infection to your blood.  Imaging studies such as ultrasound, CT scan, or MRI if the abscess is deep.  How is this treated? Small abscesses that drain on their own may not need treatment. Treatment for an abscess that does not rupture on its own may include:  Warm compresses applied to the area several times per day.  Incision and drainage. Your health care provider will make an incision to open the abscess and will remove pus and any foreign body or dead tissue. The incision area may be packed with gauze to keep it open for a few days while it heals.  Antibiotic medicines to treat infection. For a severe abscess, you may first get antibiotics through an IV and then change to oral antibiotics.  Follow these instructions at home: Abscess Care  If you have an abscess that has not drained, place a warm, clean, wet washcloth over the abscess several times a day. Do this as told by your health care provider.  Follow instructions from your health care provider about how to take care of your abscess. Make sure you: ? Cover the abscess with a bandage (dressing). ? Change your dressing or gauze as told  by your health care provider. ? Wash your hands with soap and water before you change the dressing or gauze. If soap and water are not available, use hand sanitizer.  Check your abscess every day for signs of a worsening infection. Check for: ? More redness, swelling, or pain. ? More fluid or blood. ? Warmth. ? More pus or a bad smell. Medicines  Take over-the-counter and prescription medicines only as told by your health care provider.  If you were prescribed an antibiotic medicine, take it as told by your health care provider. Do not stop  taking the antibiotic even if you start to feel better. General instructions  To avoid spreading the infection: ? Do not share personal care items, towels, or hot tubs with others. ? Avoid making skin contact with other people.  Keep all follow-up visits as told by your health care provider. This is important. Contact a health care provider if:  You have more redness, swelling, or pain around your abscess.  You have more fluid or blood coming from your abscess.  Your abscess feels warm to the touch.  You have more pus or a bad smell coming from your abscess.  You have a fever.  You have muscle aches.  You have chills or a general ill feeling. Get help right away if:  You have severe pain.  You see red streaks on your skin spreading away from the abscess. This information is not intended to replace advice given to you by your health care provider. Make sure you discuss any questions you have with your health care provider. Document Released: 11/07/2004 Document Revised: 09/24/2015 Document Reviewed: 12/07/2014 Elsevier Interactive Patient Education  [employer].

## 2017-08-10 DIAGNOSIS — M5430 Sciatica, unspecified side: Secondary | ICD-10-CM | POA: Diagnosis not present

## 2017-08-11 ENCOUNTER — Ambulatory Visit: Payer: BLUE CROSS/BLUE SHIELD | Admitting: Nurse Practitioner

## 2017-08-11 ENCOUNTER — Ambulatory Visit: Payer: BLUE CROSS/BLUE SHIELD | Admitting: Family Medicine

## 2017-08-11 ENCOUNTER — Encounter: Payer: Self-pay | Admitting: Family Medicine

## 2017-08-11 VITALS — BP 134/86 | HR 81 | Ht 64.0 in | Wt 328.0 lb

## 2017-08-11 DIAGNOSIS — M5441 Lumbago with sciatica, right side: Secondary | ICD-10-CM | POA: Diagnosis not present

## 2017-08-11 MED ORDER — MELOXICAM 15 MG PO TABS: 15.0000 mg | ORAL_TABLET | Freq: Every day | ORAL | 0 refills | Status: DC

## 2017-08-11 MED ORDER — CYCLOBENZAPRINE HCL 10 MG PO TABS: 10.0000 mg | ORAL_TABLET | Freq: Three times a day (TID) | ORAL | 0 refills | Status: DC | PRN

## 2017-08-11 NOTE — Progress Notes (Signed)
Ruth [name] - 57 y.o. female MRN [id number]  Date of birth: [birth year]  SUBJECTIVE:  Including CC & ROS.  Chief Complaint  Patient presents with  . Back Pain    Ruth [name] is a 57 y.o. female that is presenting with back pain.  Ongoing for two days. She went to [facility], she received a muscular injection of IM kenalog for the pain.  Pain is located lower right back and radiates down her right leg. Denies tingling and numbness. She has been using a cream for the painn. Denies injury or trauma. Pain is mild to severe when she walks. Denies previous surgeries. She had symptoms last year.   Review of the lumbar MRI from 10/19/2014 shows Moderate multilevel disc degeneration with disc bulging and chronic moderate multilevel disc degeneration with disc bulging and lower thoracic and all lumbar levels.  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     Review of Systems  Constitutional: Negative for fever.  HENT: Negative for congestion.   Respiratory: Negative for cough.   Cardiovascular: Negative for chest pain.  Gastrointestinal: Negative for abdominal pain.  Musculoskeletal: Positive for back pain.  Skin: Negative for color change.  Neurological: Negative for weakness.  Hematological: Negative for adenopathy.  Psychiatric/Behavioral: Negative for agitation.    HISTORY: Past Medical, Surgical, Social, and Family History Reviewed & Updated per EMR.   Pertinent Historical Findings include:  Past Medical History:  Diagnosis Date  . Anemia   . Arthritis   . Diabetes mellitus without complication ([place])    type 2  . Hyperlipidemia   . Hypertension   . Obesity     Past Surgical History:  Procedure Laterality Date  . ABDOMINAL HYSTERECTOMY     partial  . CHOLECYSTECTOMY    . COLONOSCOPY WITH PROPOFOL N/A 10/29/2016   Procedure: COLONOSCOPY WITH PROPOFOL;  Surgeon: Yetta Flock, MD;  Location: [facility];   Service: Gastroenterology;  Laterality: N/A;  . DENTAL SURGERY    . ORIF TIBIA & FIBULA FRACTURES Left     Allergies  Allergen Reactions  . Tape Other (See Comments)    Certain Band aids cause skin irritation    Family History  Problem Relation Age of Onset  . Diabetes Mother   . Stroke Mother   . Dementia Mother   . Hypertension Mother   . Pancreatic cancer Father   . Colon polyps Sister   . Cancer Maternal Grandmother        type unknown     Social History   Socioeconomic History  . Marital status: Married    Spouse name: Not on file  . Number of children: 3  . Years of education: Not on file  . Highest education level: Not on file  Occupational History  . Not on file  Social Needs  . Financial resource strain: Not on file  . Food insecurity:    Worry: Not on file    Inability: Not on file  . Transportation needs:    Medical: Not on file    Non-medical: Not on file  Tobacco Use  . Smoking status: Former Smoker    Packs/day: 1.00    Years: 20.00    Pack years: 20.00    Last attempt to quit: 02/12/2008    Years since quitting: 9.5  . Smokeless tobacco: Never Used  Substance and Sexual Activity  . Alcohol use: No  . Drug use: No  . Sexual  activity: Not on file  Lifestyle  . Physical activity:    Days per week: Not on file    Minutes per session: Not on file  . Stress: Not on file  Relationships  . Social connections:    Talks on phone: Not on file    Gets together: Not on file    Attends religious service: Not on file    Active member of club or organization: Not on file    Attends meetings of clubs or organizations: Not on file    Relationship status: Not on file  . Intimate partner violence:    Fear of current or ex partner: Not on file    Emotionally abused: Not on file    Physically abused: Not on file    Forced sexual activity: Not on file  Other Topics Concern  . Not on file  Social History Narrative  . Not on file     PHYSICAL EXAM:    VS: BP 134/86 (BP Location: Left Wrist, Patient Position: Sitting, Cuff Size: Normal)   Pulse 81   Ht 5\' 4"  (1.626 m)   Wt (!) 328 lb (148.8 kg)   SpO2 96%   BMI 56.30 kg/m  Physical Exam Gen: NAD, alert, cooperative with exam,  ENT: normal lips, normal nasal mucosa,  Eye: normal EOM, normal conjunctiva and lids CV:  no edema, +2 pedal pulses   Resp: no accessory muscle use, non-labored,  Skin: no rashes, no areas of induration  Neuro: normal tone, normal sensation to touch Psych:  normal insight, alert and oriented MSK:  Back:  TTP of the right lower back muscle and GT  Normal strength resistance with hip flexion, knee flexion and extension, with plantarflexion and dorsiflexion. Positive straight right leg raise on the right. Normal gait. Neurovascularly intact     ASSESSMENT & PLAN:   Acute right-sided low back pain with right-sided sciatica Has an acute exacerbation of her underlying back pathology.  Likely has nerve compression with contribution from the facet problems.  Similar to prior instances.  Is morbidly obese which contributes.  Has received a Kenalog injection from the urgent care. -Mobic and Flexeril -Counseled on home exercise therapy -Counseled on heat and ice -Counseled on obesity and to discuss about weight loss medications -Advised to follow-up if no improvement.  Could consider trigger point injections versus physical therapy.

## 2017-08-11 NOTE — Assessment & Plan Note (Signed)
Has an acute exacerbation of her underlying back pathology.  Likely has nerve compression with contribution from the facet problems.  Similar to prior instances.  Is morbidly obese which contributes.  Has received a Kenalog injection from the urgent care. -Mobic and Flexeril -Counseled on home exercise therapy -Counseled on heat and ice -Counseled on obesity and to discuss about weight loss medications -Advised to follow-up if no improvement.  Could consider trigger point injections versus physical therapy.

## 2017-08-11 NOTE — Patient Instructions (Addendum)
Please try heat over the area  Please try the muscle relaxer. This can make you sleep so make sure you don't have anything to do the next day when you try it.  Please try the exercises if the pain is less than a 2/10  Please take the mobic for 7 days straight and then as needed.  Please follow up in 2-3 weeks if your pain hasn't improved.

## 2017-08-15 ENCOUNTER — Ambulatory Visit: Payer: BLUE CROSS/BLUE SHIELD | Admitting: Nurse Practitioner

## 2017-08-22 ENCOUNTER — Other Ambulatory Visit (HOSPITAL_COMMUNITY)
Admission: RE | Admit: 2017-08-22 | Discharge: 2017-08-22 | Disposition: A | Discharge status: Home / Self Care | Payer: BLUE CROSS/BLUE SHIELD | Source: Ambulatory Visit | Attending: Nurse Practitioner | Admitting: Nurse Practitioner

## 2017-08-22 ENCOUNTER — Ambulatory Visit: Payer: BLUE CROSS/BLUE SHIELD | Admitting: Nurse Practitioner

## 2017-08-22 ENCOUNTER — Encounter: Payer: Self-pay | Admitting: Nurse Practitioner

## 2017-08-22 VITALS — BP 136/80 | HR 64 | Temp 98.1°F | Ht 64.0 in | Wt 330.8 lb

## 2017-08-22 DIAGNOSIS — Z1231 Encounter for screening mammogram for malignant neoplasm of breast: Secondary | ICD-10-CM

## 2017-08-22 DIAGNOSIS — Z124 Encounter for screening for malignant neoplasm of cervix: Secondary | ICD-10-CM | POA: Insufficient documentation

## 2017-08-22 DIAGNOSIS — Z0001 Encounter for general adult medical examination with abnormal findings: Secondary | ICD-10-CM | POA: Insufficient documentation

## 2017-08-22 DIAGNOSIS — Z6841 Body Mass Index (BMI) 40.0 and over, adult: Secondary | ICD-10-CM | POA: Diagnosis not present

## 2017-08-22 DIAGNOSIS — E782 Mixed hyperlipidemia: Secondary | ICD-10-CM

## 2017-08-22 DIAGNOSIS — I10 Essential (primary) hypertension: Secondary | ICD-10-CM | POA: Diagnosis not present

## 2017-08-22 DIAGNOSIS — E119 Type 2 diabetes mellitus without complications: Secondary | ICD-10-CM

## 2017-08-22 NOTE — Patient Instructions (Addendum)
Normal PAP, no need for repeat PAP due to absence of cervix. Normal CBC, renal and liver function. Lipid panel shows LDL not at goal. Should be <100. DM controlled with normal urine microalbuminuria. Medication refill sent.  [facility] printed and faxed. Need to make f/up appt in 84month Make appt with [facility] as discuss. Start regular exercise like water aerobics.  Schedule appt for diabetic eye exam and dental cleaning. Have eye exam report faxed to me  You will be contacted to schedule appt for mammogram.   may use miconazole powder 2% OTC under breast.  Calorie Counting for Weight Loss Calories are units of energy. Your body needs a certain amount of calories from food to keep you going throughout the day. When you eat more calories than your body needs, your body stores the extra calories as fat. When you eat fewer calories than your body needs, your body burns fat to get the energy it needs. Calorie counting means keeping track of how many calories you eat and drink each day. Calorie counting can be helpful if you need to lose weight. If you make sure to eat fewer calories than your body needs, you should lose weight. Ask your health care provider what a healthy weight is for you. For calorie counting to work, you will need to eat the right number of calories in a day in order to lose a healthy amount of weight per week. A dietitian can help you determine how many calories you need in a day and will give you suggestions on how to reach your calorie goal.  A healthy amount of weight to lose per week is usually 1-2 lb (0.5-0.9 kg). This usually means that your daily calorie intake should be reduced by 500-750 calories.  Eating 1,200 - 1,500 calories per day can help most women lose weight.  Eating 1,500 - 1,800 calories per day can help most men lose weight.  What is my plan? My goal is to have ___1800_______ calories per day. If I have this many calories per day, I  should lose around ______0.5____ pounds per week. What do I need to know about calorie counting? In order to meet your daily calorie goal, you will need to:  Find out how many calories are in each food you would like to eat. Try to do this before you eat.  Decide how much of the food you plan to eat.  Write down what you ate and how many calories it had. Doing this is called keeping a food log.  To successfully lose weight, it is important to balance calorie counting with a healthy lifestyle that includes regular activity. Aim for 150 minutes of moderate exercise (such as walking) or 75 minutes of vigorous exercise (such as running) each week. Where do I find calorie information?  The number of calories in a food can be found on a Nutrition Facts label. If a food does not have a Nutrition Facts label, try to look up the calories online or ask your dietitian for help. Remember that calories are listed per serving. If you choose to have more than one serving of a food, you will have to multiply the calories per serving by the amount of servings you plan to eat. For example, the label on a package of bread might say that a serving size is 1 slice and that there are 90 calories in a serving. If you eat 1 slice, you will have eaten 90 calories. If you  eat 2 slices, you will have eaten 180 calories. How do I keep a food log? Immediately after each meal, record the following information in your food log:  What you ate. Don't forget to include toppings, sauces, and other extras on the food.  How much you ate. This can be measured in cups, ounces, or number of items.  How many calories each food and drink had.  The total number of calories in the meal.  Keep your food log near you, such as in a small notebook in your pocket, or use a mobile app or website. Some programs will calculate calories for you and show you how many calories you have left for the day to meet your goal. What are some calorie  counting tips?  Use your calories on foods and drinks that will fill you up and not leave you hungry: ? Some examples of foods that fill you up are nuts and nut butters, vegetables, lean proteins, and high-fiber foods like whole grains. High-fiber foods are foods with more than 5 g fiber per serving. ? Drinks such as sodas, specialty coffee drinks, alcohol, and juices have a lot of calories, yet do not fill you up.  Eat nutritious foods and avoid empty calories. Empty calories are calories you get from foods or beverages that do not have many vitamins or protein, such as candy, sweets, and soda. It is better to have a nutritious high-calorie food (such as an avocado) than a food with few nutrients (such as a bag of chips).  Know how many calories are in the foods you eat most often. This will help you calculate calorie counts faster.  Pay attention to calories in drinks. Low-calorie drinks include water and unsweetened drinks.  Pay attention to nutrition labels for "low fat" or "fat free" foods. These foods sometimes have the same amount of calories or more calories than the full fat versions. They also often have added sugar, starch, or salt, to make up for flavor that was removed with the fat.  Find a way of tracking calories that works for you. Get creative. Try different apps or programs if writing down calories does not work for you. What are some portion control tips?  Know how many calories are in a serving. This will help you know how many servings of a certain food you can have.  Use a measuring cup to measure serving sizes. You could also try weighing out portions on a kitchen scale. With time, you will be [place] to estimate serving sizes for some foods.  Take some time to put servings of different foods on your favorite plates, bowls, and cups so you know what a serving looks like.  Try not to eat straight from a bag or box. Doing this can lead to overeating. Put the amount you would  like to eat in a cup or on a plate to make sure you are eating the right portion.  Use smaller plates, glasses, and bowls to prevent overeating.  Try not to multitask (for example, watch TV or use your computer) while eating. If it is time to eat, sit down at a table and enjoy your food. This will help you to know when you are full. It will also help you to be aware of what you are eating and how much you are eating. What are tips for following this plan? Reading food labels  Check the calorie count compared to the serving size. The serving size may be smaller  than what you are used to eating.  Check the source of the calories. Make sure the food you are eating is high in vitamins and protein and low in saturated and trans fats. Shopping  Read nutrition labels while you shop. This will help you make healthy decisions before you decide to purchase your food.  Make a grocery list and stick to it. Cooking  Try to cook your favorite foods in a healthier way. For example, try baking instead of frying.  Use low-fat dairy products. Meal planning  Use more fruits and vegetables. Half of your plate should be fruits and vegetables.  Include lean proteins like poultry and fish. How do I count calories when eating out?  Ask for smaller portion sizes.  Consider sharing an entree and sides instead of getting your own entree.  If you get your own entree, eat only half. Ask for a box at the beginning of your meal and put the rest of your entree in it so you are not tempted to eat it.  If calories are listed on the menu, choose the lower calorie options.  Choose dishes that include vegetables, fruits, whole grains, low-fat dairy products, and lean protein.  Choose items that are boiled, broiled, grilled, or steamed. Stay away from items that are buttered, battered, fried, or served with cream sauce. Items labeled "crispy" are usually fried, unless stated otherwise.  Choose water, low-fat milk,  unsweetened iced tea, or other drinks without added sugar. If you want an alcoholic beverage, choose a lower calorie option such as a glass of wine or light beer.  Ask for dressings, sauces, and syrups on the side. These are usually high in calories, so you should limit the amount you eat.  If you want a salad, choose a garden salad and ask for grilled meats. Avoid extra toppings like bacon, cheese, or fried items. Ask for the dressing on the side, or ask for olive oil and vinegar or lemon to use as dressing.  Estimate how many servings of a food you are given. For example, a serving of cooked rice is  cup or about the size of half a baseball. Knowing serving sizes will help you be aware of how much food you are eating at restaurants. The list below tells you how big or small some common portion sizes are based on everyday objects: ? 1 oz-4 stacked dice. ? 3 oz-1 deck of cards. ? 1 tsp-1 die. ? 1 Tbsp- a ping-pong ball. ? 2 Tbsp-1 ping-pong ball. ?  cup- baseball. ? 1 cup-1 baseball. Summary  Calorie counting means keeping track of how many calories you eat and drink each day. If you eat fewer calories than your body needs, you should lose weight.  A healthy amount of weight to lose per week is usually 1-2 lb (0.5-0.9 kg). This usually means reducing your daily calorie intake by 500-750 calories.  The number of calories in a food can be found on a Nutrition Facts label. If a food does not have a Nutrition Facts label, try to look up the calories online or ask your dietitian for help.  Use your calories on foods and drinks that will fill you up, and not on foods and drinks that will leave you hungry.  Use smaller plates, glasses, and bowls to prevent overeating. This information is not intended to replace advice given to you by your health care provider. Make sure you discuss any questions you have with your health care provider. Document  Released: 01/28/2005 Document Revised: 12/29/2015  Document Reviewed: 12/29/2015 Elsevier Interactive Patient Education  2018 [place] Maintenance, Female Adopting a healthy lifestyle and getting preventive care can go a long way to promote health and wellness. Talk with your health care provider about what schedule of regular examinations is right for you. This is a good chance for you to check in with your provider about disease prevention and staying healthy. In between checkups, there are plenty of things you can do on your own. Experts have done a lot of research about which lifestyle changes and preventive measures are most likely to keep you healthy. Ask your health care provider for more information. Weight and diet Eat a healthy diet  Be sure to include plenty of vegetables, fruits, low-fat dairy products, and lean protein.  Do not eat a lot of foods high in solid fats, added sugars, or salt.  Get regular exercise. This is one of the most important things you can do for your health. ? Most adults should exercise for at least 150 minutes each week. The exercise should increase your heart rate and make you sweat (moderate-intensity exercise). ? Most adults should also do strengthening exercises at least twice a week. This is in addition to the moderate-intensity exercise.  Maintain a healthy weight  Body mass index (BMI) is a measurement that can be used to identify possible weight problems. It estimates body fat based on height and weight. Your health care provider can help determine your BMI and help you achieve or maintain a healthy weight.  For females 87 years of age and older: ? A BMI below 18.5 is considered underweight. ? A BMI of 18.5 to 24.9 is normal. ? A BMI of 25 to 29.9 is considered overweight. ? A BMI of 30 and above is considered obese.  Watch levels of cholesterol and blood lipids  You should start having your blood tested for lipids and cholesterol at 57 years of age, then have this test every 5  years.  You may need to have your cholesterol levels checked more often if: ? Your lipid or cholesterol levels are high. ? You are older than 57 years of age. ? You are at high risk for heart disease.  Cancer screening Lung Cancer  Lung cancer screening is recommended for adults 40-78 years old who are at high risk for lung cancer because of a history of smoking.  A yearly low-dose CT scan of the lungs is recommended for people who: ? Currently smoke. ? Have quit within the past 15 years. ? Have at least a 30-pack-year history of smoking. A pack year is smoking an average of one pack of cigarettes a day for 1 year.  Yearly screening should continue until it has been 15 years since you quit.  Yearly screening should stop if you develop a health problem that would prevent you from having lung cancer treatment.  Breast Cancer  Practice breast self-awareness. This means understanding how your breasts normally appear and feel.  It also means doing regular breast self-exams. Let your health care provider know about any changes, no matter how small.  If you are in your 20s or 30s, you should have a clinical breast exam (CBE) by a health care provider every 1-3 years as part of a regular health exam.  If you are 23 or older, have a CBE every year. Also consider having a breast X-ray (mammogram) every year.  If you have a family history of  breast cancer, talk to your health care provider about genetic screening.  If you are at high risk for breast cancer, talk to your health care provider about having an MRI and a mammogram every year.  Breast cancer gene (BRCA) assessment is recommended for women who have family members with BRCA-related cancers. BRCA-related cancers include: ? Breast. ? Ovarian. ? Tubal. ? Peritoneal cancers.  Results of the assessment will determine the need for genetic counseling and BRCA1 and BRCA2 testing.  Cervical Cancer Your health care provider may  recommend that you be screened regularly for cancer of the pelvic organs (ovaries, uterus, and vagina). This screening involves a pelvic examination, including checking for microscopic changes to the surface of your cervix (Pap test). You may be encouraged to have this screening done every 3 years, beginning at age 21.  For women ages 30-65, health care providers may recommend pelvic exams and Pap testing every 3 years, or they may recommend the Pap and pelvic exam, combined with testing for human papilloma virus (HPV), every 5 years. Some types of HPV increase your risk of cervical cancer. Testing for HPV may also be done on women of any age with unclear Pap test results.  Other health care providers may not recommend any screening for nonpregnant women who are considered low risk for pelvic cancer and who do not have symptoms. Ask your health care provider if a screening pelvic exam is right for you.  If you have had past treatment for cervical cancer or a condition that could lead to cancer, you need Pap tests and screening for cancer for at least 20 years after your treatment. If Pap tests have been discontinued, your risk factors (such as having a new sexual partner) need to be reassessed to determine if screening should resume. Some women have medical problems that increase the chance of getting cervical cancer. In these cases, your health care provider may recommend more frequent screening and Pap tests.  Colorectal Cancer  This type of cancer can be detected and often prevented.  Routine colorectal cancer screening usually begins at 57 years of age and continues through 57 years of age.  Your health care provider may recommend screening at an earlier age if you have risk factors for colon cancer.  Your health care provider may also recommend using home test kits to check for hidden blood in the stool.  A small camera at the end of a tube can be used to examine your colon directly  (sigmoidoscopy or colonoscopy). This is done to check for the earliest forms of colorectal cancer.  Routine screening usually begins at age 50.  Direct examination of the colon should be repeated every 5-10 years through 57 years of age. However, you may need to be screened more often if early forms of precancerous polyps or small growths are found.  Skin Cancer  Check your skin from head to toe regularly.  Tell your health care provider about any new moles or changes in moles, especially if there is a change in a mole's shape or color.  Also tell your health care provider if you have a mole that is larger than the size of a pencil eraser.  Always use sunscreen. Apply sunscreen liberally and repeatedly throughout the day.  Protect yourself by wearing long sleeves, pants, a wide-brimmed hat, and sunglasses whenever you are outside.  Heart disease, diabetes, and high blood pressure  High blood pressure causes heart disease and increases the risk of stroke. High blood   pressure is more likely to develop in: ? People who have blood pressure in the high end of the normal range (130-139/85-89 mm Hg). ? People who are overweight or obese. ? People who are African American.  If you are 60-67 years of age, have your blood pressure checked every 3-5 years. If you are 95 years of age or older, have your blood pressure checked every year. You should have your blood pressure measured twice-once when you are at a hospital or clinic, and once when you are not at a hospital or clinic. Record the average of the two measurements. To check your blood pressure when you are not at a hospital or clinic, you can use: ? An automated blood pressure machine at a pharmacy. ? A home blood pressure monitor.  If you are between 59 years and 32 years old, ask your health care provider if you should take aspirin to prevent strokes.  Have regular diabetes screenings. This involves taking a blood sample to check your  fasting blood sugar level. ? If you are at a normal weight and have a low risk for diabetes, have this test once every three years after 57 years of age. ? If you are overweight and have a high risk for diabetes, consider being tested at a younger age or more often. Preventing infection Hepatitis B  If you have a higher risk for hepatitis B, you should be screened for this virus. You are considered at high risk for hepatitis B if: ? You were born in a country where hepatitis B is common. Ask your health care provider which countries are considered high risk. ? Your parents were born in a high-risk country, and you have not been immunized against hepatitis B (hepatitis B vaccine). ? You have HIV or AIDS. ? You use needles to inject street drugs. ? You live with someone who has hepatitis B. ? You have had sex with someone who has hepatitis B. ? You get hemodialysis treatment. ? You take certain medicines for conditions, including cancer, organ transplantation, and autoimmune conditions.  Hepatitis C  Blood testing is recommended for: ? Everyone born from [birth year] through 1965. ? Anyone with known risk factors for hepatitis C.  Sexually transmitted infections (STIs)  You should be screened for sexually transmitted infections (STIs) including gonorrhea and chlamydia if: ? You are sexually active and are younger than 57 years of age. ? You are older than 57 years of age and your health care provider tells you that you are at risk for this type of infection. ? Your sexual activity has changed since you were last screened and you are at an increased risk for chlamydia or gonorrhea. Ask your health care provider if you are at risk.  If you do not have HIV, but are at risk, it may be recommended that you take a prescription medicine daily to prevent HIV infection. This is called pre-exposure prophylaxis (PrEP). You are considered at risk if: ? You are sexually active and do not regularly use condoms  or know the HIV status of your partner(s). ? You take drugs by injection. ? You are sexually active with a partner who has HIV.  Talk with your health care provider about whether you are at high risk of being infected with HIV. If you choose to begin PrEP, you should first be tested for HIV. You should then be tested every 3 months for as long as you are taking PrEP. Pregnancy  If you are premenopausal  and you may become pregnant, ask your health care provider about preconception counseling.  If you may become pregnant, take 400 to 800 micrograms (mcg) of folic acid every day.  If you want to prevent pregnancy, talk to your health care provider about birth control (contraception). Osteoporosis and menopause  Osteoporosis is a disease in which the bones lose minerals and strength with aging. This can result in serious bone fractures. Your risk for osteoporosis can be identified using a bone density scan.  If you are 83 years of age or older, or if you are at risk for osteoporosis and fractures, ask your health care provider if you should be screened.  Ask your health care provider whether you should take a calcium or vitamin D supplement to lower your risk for osteoporosis.  Menopause may have certain physical symptoms and risks.  Hormone replacement therapy may reduce some of these symptoms and risks. Talk to your health care provider about whether hormone replacement therapy is right for you. Follow these instructions at home:  Schedule regular health, dental, and eye exams.  Stay current with your immunizations.  Do not use any tobacco products including cigarettes, chewing tobacco, or electronic cigarettes.  If you are pregnant, do not drink alcohol.  If you are breastfeeding, limit how much and how often you drink alcohol.  Limit alcohol intake to no more than 1 drink per day for nonpregnant women. One drink equals 12 ounces of beer, 5 ounces of wine, or 1 ounces of hard  liquor.  Do not use street drugs.  Do not share needles.  Ask your health care provider for help if you need support or information about quitting drugs.  Tell your health care provider if you often feel depressed.  Tell your health care provider if you have ever been abused or do not feel safe at home. This information is not intended to replace advice given to you by your health care provider. Make sure you discuss any questions you have with your health care provider. Document Released: 08/13/2010 Document Revised: 07/06/2015 Document Reviewed: 11/01/2014 Elsevier Interactive Patient Education  [employer].

## 2017-08-22 NOTE — Progress Notes (Signed)
Subjective:    Patient ID: Ruth [name], female    DOB: December 06, 1960, 57 y.o.   MRN: [id number]  Patient presents today for complete physical  HPI   Obesity: Difficulty with losing weight with use of weight watcher and [facility] nutrition Does not exercise. BMI 56.8. Will like to use appetite suppressant. Wt Readings from Last 3 Encounters:  08/22/17 (!) 330 lb 12.8 oz (150 kg)  08/11/17 (!) 328 lb (148.8 kg)  04/08/17 (!) 327 lb 12.8 oz (148.7 kg)   HTN: Controlled with lisinopril/HCTZ. BP Readings from Last 3 Encounters:  08/22/17 136/80  08/11/17 134/86  04/08/17 118/80   DM: Uncontrolled with glipizide and metformin. Does not check glucose at home.  Immunizations: (TDAP, Hep C screen, Pneumovax, Influenza, zoster)  Health Maintenance  Topic Date Due  . Pneumococcal vaccine (1) 10/15/1962  . Eye exam for diabetics  10/15/1970  . Tetanus Vaccine  10/15/1979  . Pap Smear  10/14/1981  . Mammogram  10/15/2010  . Flu Shot  09/11/2017  . Hemoglobin A1C  02/22/2018  . Complete foot exam   08/23/2018  . Colon Cancer Screening  10/30/2026  .  Hepatitis C: One time screening is recommended by Center for Disease Control  (CDC) for  adults born from [birth year] through 1965.   Completed  . HIV Screening  Completed   Fall Risk: Fall Risk  08/22/2017 04/29/2016  Falls in the past year? No No   Home Safety:home with husband.  Depression/Suicide: Depression screen [facility] 2/9 08/22/2017 04/29/2016  Decreased Interest 0 0  Down, Depressed, Hopeless 0 0  PHQ - 2 Score 0 0   Pap Smear (every 49yrs for >21-29 without HPV, every 12yrs for >30-91yrs with HPV):s/p hysterectomy with ovaries till present, hx of abnormal PAP, no FHx of cervical or ovarian cancer, will like to continue PAP screen  Mammogram (yearly, >65yrs):needed.  Vision:needed  Dental:needed  Advanced Directive: Advanced Directives 10/29/2016  Does Patient Have a Medical Advance Directive? No  Would patient like information on  creating a medical advance directive? -    Medications and allergies reviewed with patient and updated if appropriate.  Patient Active Problem List   Diagnosis Date Noted  . Acute right-sided low back pain with right-sided sciatica 08/11/2017  . Hematochezia   . Benign neoplasm of colon   . Benign neoplasm of transverse colon   . Benign neoplasm of descending colon   . Benign neoplasm of rectum   . Lumbar paraspinal muscle spasm 07/31/2016  . Diabetes ([place]) 04/29/2016  . HTN (hypertension), benign 04/29/2016  . Hyperlipidemia 04/29/2016  . Morbid obesity with BMI of 50.0-59.9, adult ([place]) 04/29/2016    Current Outpatient Medications on File Prior to Visit  Medication Sig Dispense Refill  . cyclobenzaprine (FLEXERIL) 10 MG tablet Take 1 tablet (10 mg total) by mouth 3 (three) times daily as needed for muscle spasms. 30 tablet 0  . meloxicam (MOBIC) 15 MG tablet Take 1 tablet (15 mg total) by mouth daily. 30 tablet 0  . Multiple Vitamins-Minerals (MULTIVITAMIN WITH MINERALS) tablet Take 1 tablet by mouth daily.     No current facility-administered medications on file prior to visit.     Past Medical History:  Diagnosis Date  . Anemia   . Arthritis   . Diabetes mellitus without complication ([place])    type 2  . Hyperlipidemia   . Hypertension   . Obesity     Past Surgical History:  Procedure Laterality Date  . ABDOMINAL HYSTERECTOMY  partial  . CHOLECYSTECTOMY    . COLONOSCOPY WITH PROPOFOL N/A 10/29/2016   Procedure: COLONOSCOPY WITH PROPOFOL;  Surgeon: Yetta Flock, MD;  Location: [facility];  Service: Gastroenterology;  Laterality: N/A;  . DENTAL SURGERY    . ORIF TIBIA & FIBULA FRACTURES Left     Social History   Socioeconomic History  . Marital status: Married    Spouse name: Not on file  . Number of children: 3  . Years of education: Not on file  . Highest education level: Not on file  Occupational History  . Not on file  Social Needs  .  Financial resource strain: Not on file  . Food insecurity:    Worry: Not on file    Inability: Not on file  . Transportation needs:    Medical: Not on file    Non-medical: Not on file  Tobacco Use  . Smoking status: Former Smoker    Packs/day: 1.00    Years: 20.00    Pack years: 20.00    Last attempt to quit: 02/12/2008    Years since quitting: 9.5  . Smokeless tobacco: Never Used  Substance and Sexual Activity  . Alcohol use: No  . Drug use: No  . Sexual activity: Yes    Birth control/protection: Surgical, Post-menopausal  Lifestyle  . Physical activity:    Days per week: Not on file    Minutes per session: Not on file  . Stress: Not on file  Relationships  . Social connections:    Talks on phone: Not on file    Gets together: Not on file    Attends religious service: Not on file    Active member of club or organization: Not on file    Attends meetings of clubs or organizations: Not on file    Relationship status: Not on file  Other Topics Concern  . Not on file  Social History Narrative  . Not on file    Family History  Problem Relation Age of Onset  . Diabetes Mother   . Stroke Mother   . Dementia Mother   . Hypertension Mother   . Pancreatic cancer Father   . Colon polyps Sister   . Cancer Maternal Grandmother        type unknown        Review of Systems  Constitutional: Negative for fever, malaise/fatigue and weight loss.  HENT: Negative for congestion and sore throat.   Eyes:       Negative for visual changes  Respiratory: Negative for cough and shortness of breath.   Cardiovascular: Negative for chest pain, palpitations and leg swelling.  Gastrointestinal: Negative for blood in stool, constipation, diarrhea and heartburn.  Genitourinary: Negative for dysuria, frequency and urgency.  Musculoskeletal: Negative for falls, joint pain and myalgias.  Skin: Negative for rash.  Neurological: Negative for dizziness, sensory change, weakness and headaches.    Endo/Heme/Allergies: Does not bruise/bleed easily.  Psychiatric/Behavioral: Negative for depression, substance abuse and suicidal ideas. The patient is not nervous/anxious.     Objective:   Vitals:   08/22/17 0824  BP: 136/80  Pulse: 64  Temp: 98.1 F (36.7 C)  SpO2: 96%    Body mass index is 56.78 kg/m.   Physical Examination:  Physical Exam  Constitutional: She is oriented to person, place, and time. She appears well-developed. No distress.  HENT:  Right Ear: External ear normal.  Left Ear: External ear normal.  Nose: Nose normal.  Mouth/Throat: Oropharynx is clear and  moist. No oropharyngeal exudate.  Eyes: Pupils are equal, round, and reactive to light. Conjunctivae and EOM are normal.  Neck: Normal range of motion. Neck supple. No thyromegaly present.  Cardiovascular: Normal rate, regular rhythm, normal heart sounds and intact distal pulses.  Pulmonary/Chest: Effort normal and breath sounds normal. No respiratory distress. She exhibits no tenderness. Right breast exhibits no inverted nipple, no mass, no nipple discharge, no skin change and no tenderness. Left breast exhibits no inverted nipple, no mass, no nipple discharge, no skin change and no tenderness. Breasts are symmetrical.  Abdominal: Soft. Bowel sounds are normal.  Genitourinary: Rectum normal and vagina normal. Pelvic exam was performed with patient supine. There is no rash or tenderness on the right labia. There is no rash or tenderness on the left labia. No erythema or tenderness in the vagina. No vaginal discharge found.  Genitourinary Comments: Chaperone present. Cervix absent   Musculoskeletal: Normal range of motion. She exhibits no edema.  Lymphadenopathy:    She has no cervical adenopathy.  Neurological: She is alert and oriented to person, place, and time. She has normal reflexes.  Skin: Skin is warm and dry. No rash noted.  Normal diabetic foot exam  Psychiatric: She has a normal mood and affect.  Her behavior is normal. Thought content normal.  Vitals reviewed.   ASSESSMENT and PLAN:  Ruth [name] was seen today for annual exam.  Diagnoses and all orders for this visit:  Encounter for preventative adult health care exam with abnormal findings -     CBC -     Comprehensive metabolic panel -     TSH -     Cytology - PAP -     MM 3D SCREEN BREAST BILATERAL; Future  HTN (hypertension), benign -     Lorcaserin HCl (BELVIQ) 10 MG TABS; Take 1 tablet by mouth 2 (two) times daily after a meal. -     lisinopril (PRINIVIL,ZESTRIL) 10 MG tablet; Take 1 tablet (10 mg total) by mouth daily. -     atenolol (TENORMIN) 50 MG tablet; Take 1 tablet (50 mg total) by mouth daily. -     hydrochlorothiazide (HYDRODIURIL) 12.5 MG tablet; Take 1 tablet (12.5 mg total) by mouth daily.  Mixed hyperlipidemia -     Lipid panel -     Lorcaserin HCl (BELVIQ) 10 MG TABS; Take 1 tablet by mouth 2 (two) times daily after a meal.  Encounter for Papanicolaou smear for cervical cancer screening -     Cytology - PAP  Breast cancer screening by mammogram -     MM 3D SCREEN BREAST BILATERAL; Future  Type 2 diabetes mellitus without complication, without long-term current use of insulin (HCC) -     Hemoglobin A1c -     Microalbumin / creatinine urine ratio -     Lorcaserin HCl (BELVIQ) 10 MG TABS; Take 1 tablet by mouth 2 (two) times daily after a meal. -     glipiZIDE (GLUCOTROL) 5 MG tablet; Take 1 tablet (5 mg total) by mouth daily before breakfast. -     metFORMIN (GLUCOPHAGE) 500 MG tablet; Take 1 tablet (500 mg total) by mouth 2 (two) times daily with a meal.  Class 3 severe obesity with serious comorbidity and body mass index (BMI) of 50.0 to 59.9 in adult, unspecified obesity type (HCC) -     Lorcaserin HCl (BELVIQ) 10 MG TABS; Take 1 tablet by mouth 2 (two) times daily after a meal.   No problem-specific Assessment &  Plan notes found for this encounter.     Follow up: Return in about 1 month  (around 09/19/2017) for weight loss management.  [name], NP

## 2017-08-23 LAB — COMPREHENSIVE METABOLIC PANEL
ALBUMIN: 4.1 g/dL (ref 3.5–5.5)
ALK PHOS: 84 IU/L (ref 39–117)
ALT: 19 IU/L (ref 0–32)
AST: 18 IU/L (ref 0–40)
Albumin/Globulin Ratio: 1.4 (ref 1.2–2.2)
BUN / CREAT RATIO: 34 — AB (ref 9–23)
BUN: 21 mg/dL (ref 6–24)
Bilirubin Total: 0.3 mg/dL (ref 0.0–1.2)
CHLORIDE: 101 mmol/L (ref 96–106)
CO2: 24 mmol/L (ref 20–29)
Calcium: 9.7 mg/dL (ref 8.7–10.2)
Creatinine, Ser: 0.62 mg/dL (ref 0.57–1.00)
GFR calc non Af Amer: 101 mL/min/{1.73_m2} (ref >59)
GFR, EST AFRICAN AMERICAN: 117 mL/min/{1.73_m2} (ref >59)
GLOBULIN, TOTAL: 3 g/dL (ref 1.5–4.5)
GLUCOSE: 155 mg/dL — AB (ref 65–99)
Potassium: 4.8 mmol/L (ref 3.5–5.2)
SODIUM: 140 mmol/L (ref 134–144)
TOTAL PROTEIN: 7.1 g/dL (ref 6.0–8.5)

## 2017-08-23 LAB — HEMOGLOBIN A1C
Est. average glucose Bld gHb Est-mCnc: 140 mg/dL
Hgb A1c MFr Bld: 6.5 % — ABNORMAL HIGH (ref 4.8–5.6)

## 2017-08-23 LAB — LIPID PANEL
Chol/HDL Ratio: 3 ratio (ref 0.0–4.4)
Cholesterol, Total: 186 mg/dL (ref 100–199)
HDL: 62 mg/dL (ref >39)
LDL CALC: 114 mg/dL — AB (ref 0–99)
Triglycerides: 50 mg/dL (ref 0–149)
VLDL CHOLESTEROL CAL: 10 mg/dL (ref 5–40)

## 2017-08-23 LAB — TSH: TSH: 1.73 u[IU]/mL (ref 0.450–4.500)

## 2017-08-23 LAB — CBC
HEMATOCRIT: 39.5 % (ref 34.0–46.6)
HEMOGLOBIN: 12.6 g/dL (ref 11.1–15.9)
MCH: 28.9 pg (ref 26.6–33.0)
MCHC: 31.9 g/dL (ref 31.5–35.7)
MCV: 91 fL (ref 79–97)
Platelets: 275 10*3/uL (ref 150–450)
RBC: 4.36 x10E6/uL (ref 3.77–5.28)
RDW: 14.5 % (ref 12.3–15.4)
WBC: 8.1 10*3/uL (ref 3.4–10.8)

## 2017-08-23 LAB — MICROALBUMIN / CREATININE URINE RATIO
CREATININE, UR: 55.2 mg/dL
Microalb/Creat Ratio: <5.4 mg/g creat (ref 0.0–30.0)

## 2017-08-25 ENCOUNTER — Encounter: Payer: Self-pay | Admitting: Nurse Practitioner

## 2017-08-25 LAB — CYTOLOGY - PAP
DIAGNOSIS: NEGATIVE
HPV: NOT DETECTED

## 2017-08-25 MED ORDER — LORCASERIN HCL 10 MG PO TABS: 1.0000 | ORAL_TABLET | Freq: Two times a day (BID) | ORAL | 0 refills | Status: DC

## 2017-08-25 MED ORDER — HYDROCHLOROTHIAZIDE 12.5 MG PO TABS: 12.5000 mg | ORAL_TABLET | Freq: Every day | ORAL | 1 refills | Status: DC

## 2017-08-25 MED ORDER — METFORMIN HCL 500 MG PO TABS: 500.0000 mg | ORAL_TABLET | Freq: Two times a day (BID) | ORAL | 1 refills | Status: DC

## 2017-08-25 MED ORDER — LISINOPRIL 10 MG PO TABS: 10.0000 mg | ORAL_TABLET | Freq: Every day | ORAL | 1 refills | Status: DC

## 2017-08-25 MED ORDER — GLIPIZIDE 5 MG PO TABS: 5.0000 mg | ORAL_TABLET | Freq: Every day | ORAL | 1 refills | Status: DC

## 2017-08-25 MED ORDER — ATENOLOL 50 MG PO TABS: 50.0000 mg | ORAL_TABLET | Freq: Every day | ORAL | 1 refills | Status: DC

## 2017-08-26 ENCOUNTER — Telehealth: Payer: Self-pay | Admitting: Nurse Practitioner

## 2017-08-26 NOTE — Telephone Encounter (Signed)
Copied from [place] [id number]. Topic: Quick Communication - Lab Results >> Aug 26, 2017 12:13 PM Cecelia Byars, NT wrote: Patient returned a call from the practice for lab results ,NT can disclose  please call [phone] she will be available for the next hour she is on her lunch ,please call within the hour ,if not she will call later

## 2017-08-26 NOTE — Telephone Encounter (Signed)
PA approved. Pharmacy notified 

## 2017-08-26 NOTE — Telephone Encounter (Signed)
Charted in result notes. 

## 2017-08-26 NOTE — Telephone Encounter (Signed)
PA for Belviq started. Waiting for result   BIN: [id number] PCN [id number] [facility] ID [id number]  [name] (Key: [id number])   [facility] is reviewing your PA request. Typically an electronic response will be received within 72 hours. To check for an update later, open this request from your dashboard. You may close this dialog and return to your dashboard to perform other tasks.

## 2017-09-16 ENCOUNTER — Ambulatory Visit
Admission: RE | Admit: 2017-09-16 | Discharge: 2017-09-16 | Disposition: A | Discharge status: Home / Self Care | Payer: BLUE CROSS/BLUE SHIELD | Source: Ambulatory Visit | Attending: Nurse Practitioner | Admitting: Nurse Practitioner

## 2017-09-16 DIAGNOSIS — Z1231 Encounter for screening mammogram for malignant neoplasm of breast: Secondary | ICD-10-CM

## 2017-09-16 DIAGNOSIS — Z0001 Encounter for general adult medical examination with abnormal findings: Secondary | ICD-10-CM

## 2017-09-25 ENCOUNTER — Telehealth: Payer: Self-pay

## 2017-09-25 NOTE — Telephone Encounter (Signed)
Pt call back and advised she has not gotten her records from the health dept on her shots.She will call them today and see if she can get those. Pt is going to check also if the MMR part has to be filled out on the form from her job just in case she is unable to get the info.  Copied from CRM #[id number]. Topic: Quick Communication - Office Called Patient >> Sep 23, 2017  2:28 PM Noitamyae, Phetcharat, LPN wrote: Reason for CRM: left vm for the pt to call back, need to ask about Health Screening form. MMR require by the form but we do not have record of her immunization. Need pt help get this so we can complete the form. PN >> Sep 24, 2017  9:08 AM Noitamyae, Phetcharat, LPN wrote: Left another vm for the pt to call back.  >> Sep 25, 2017  7:26 AM Harrald, Kathy J wrote: Pt returned call and is aware she needs to get her immunization record, specifically the MMR. Pt states she got at the health dept (all her shots) and she will contact them. >> Sep 25, 2017  8:29 AM Noitamyae, Phetcharat, LPN wrote: Left vm for the pt to call back.   

## 2017-09-26 NOTE — Telephone Encounter (Signed)
Form at my desk. Waiting for the pt to call back.

## 2017-10-10 ENCOUNTER — Encounter: Payer: Self-pay | Admitting: Nurse Practitioner

## 2017-10-10 ENCOUNTER — Ambulatory Visit: Payer: BLUE CROSS/BLUE SHIELD | Admitting: Nurse Practitioner

## 2017-10-10 VITALS — BP 132/80 | HR 76 | Temp 97.9°F | Ht 64.0 in | Wt 336.0 lb

## 2017-10-10 DIAGNOSIS — Z713 Dietary counseling and surveillance: Secondary | ICD-10-CM

## 2017-10-10 DIAGNOSIS — Z6841 Body Mass Index (BMI) 40.0 and over, adult: Secondary | ICD-10-CM | POA: Diagnosis not present

## 2017-10-10 DIAGNOSIS — I10 Essential (primary) hypertension: Secondary | ICD-10-CM

## 2017-10-10 MED ORDER — LORCASERIN HCL ER 20 MG PO TB24: 1.0000 | ORAL_TABLET | Freq: Every day | ORAL | 1 refills | Status: DC

## 2017-10-10 NOTE — Patient Instructions (Signed)
Medical weight loss clinic [phone].  Calorie Counting for Weight Loss Calories are units of energy. Your body needs a certain amount of calories from food to keep you going throughout the day. When you eat more calories than your body needs, your body stores the extra calories as fat. When you eat fewer calories than your body needs, your body burns fat to get the energy it needs. Calorie counting means keeping track of how many calories you eat and drink each day. Calorie counting can be helpful if you need to lose weight. If you make sure to eat fewer calories than your body needs, you should lose weight. Ask your health care provider what a healthy weight is for you. For calorie counting to work, you will need to eat the right number of calories in a day in order to lose a healthy amount of weight per week. A dietitian can help you determine how many calories you need in a day and will give you suggestions on how to reach your calorie goal.  A healthy amount of weight to lose per week is usually 1-2 lb (0.5-0.9 kg). This usually means that your daily calorie intake should be reduced by 500-750 calories.  Eating 1,200 - 1,500 calories per day can help most women lose weight.  Eating 1,500 - 1,800 calories per day can help most men lose weight.  What is my plan? My goal is to have __________ calories per day. If I have this many calories per day, I should lose around __________ pounds per week. What do I need to know about calorie counting? In order to meet your daily calorie goal, you will need to:  Find out how many calories are in each food you would like to eat. Try to do this before you eat.  Decide how much of the food you plan to eat.  Write down what you ate and how many calories it had. Doing this is called keeping a food log.  To successfully lose weight, it is important to balance calorie counting with a healthy lifestyle that includes regular activity. Aim for 150 minutes  of moderate exercise (such as walking) or 75 minutes of vigorous exercise (such as running) each week. Where do I find calorie information?  The number of calories in a food can be found on a Nutrition Facts label. If a food does not have a Nutrition Facts label, try to look up the calories online or ask your dietitian for help. Remember that calories are listed per serving. If you choose to have more than one serving of a food, you will have to multiply the calories per serving by the amount of servings you plan to eat. For example, the label on a package of bread might say that a serving size is 1 slice and that there are 90 calories in a serving. If you eat 1 slice, you will have eaten 90 calories. If you eat 2 slices, you will have eaten 180 calories. How do I keep a food log? Immediately after each meal, record the following information in your food log:  What you ate. Don't forget to include toppings, sauces, and other extras on the food.  How much you ate. This can be measured in cups, ounces, or number of items.  How many calories each food and drink had.  The total number of calories in the meal.  Keep your food log near you, such as in a small notebook in your pocket,  or use a mobile app or website. Some programs will calculate calories for you and show you how many calories you have left for the day to meet your goal. What are some calorie counting tips?  Use your calories on foods and drinks that will fill you up and not leave you hungry: ? Some examples of foods that fill you up are nuts and nut butters, vegetables, lean proteins, and high-fiber foods like whole grains. High-fiber foods are foods with more than 5 g fiber per serving. ? Drinks such as sodas, specialty coffee drinks, alcohol, and juices have a lot of calories, yet do not fill you up.  Eat nutritious foods and avoid empty calories. Empty calories are calories you get from foods or beverages that do not have many  vitamins or protein, such as candy, sweets, and soda. It is better to have a nutritious high-calorie food (such as an avocado) than a food with few nutrients (such as a bag of chips).  Know how many calories are in the foods you eat most often. This will help you calculate calorie counts faster.  Pay attention to calories in drinks. Low-calorie drinks include water and unsweetened drinks.  Pay attention to nutrition labels for "low fat" or "fat free" foods. These foods sometimes have the same amount of calories or more calories than the full fat versions. They also often have added sugar, starch, or salt, to make up for flavor that was removed with the fat.  Find a way of tracking calories that works for you. Get creative. Try different apps or programs if writing down calories does not work for you. What are some portion control tips?  Know how many calories are in a serving. This will help you know how many servings of a certain food you can have.  Use a measuring cup to measure serving sizes. You could also try weighing out portions on a kitchen scale. With time, you will be [place] to estimate serving sizes for some foods.  Take some time to put servings of different foods on your favorite plates, bowls, and cups so you know what a serving looks like.  Try not to eat straight from a bag or box. Doing this can lead to overeating. Put the amount you would like to eat in a cup or on a plate to make sure you are eating the right portion.  Use smaller plates, glasses, and bowls to prevent overeating.  Try not to multitask (for example, watch TV or use your computer) while eating. If it is time to eat, sit down at a table and enjoy your food. This will help you to know when you are full. It will also help you to be aware of what you are eating and how much you are eating. What are tips for following this plan? Reading food labels  Check the calorie count compared to the serving size. The serving  size may be smaller than what you are used to eating.  Check the source of the calories. Make sure the food you are eating is high in vitamins and protein and low in saturated and trans fats. Shopping  Read nutrition labels while you shop. This will help you make healthy decisions before you decide to purchase your food.  Make a grocery list and stick to it. Cooking  Try to cook your favorite foods in a healthier way. For example, try baking instead of frying.  Use low-fat dairy products. Meal planning  Use more  fruits and vegetables. Half of your plate should be fruits and vegetables.  Include lean proteins like poultry and fish. How do I count calories when eating out?  Ask for smaller portion sizes.  Consider sharing an entree and sides instead of getting your own entree.  If you get your own entree, eat only half. Ask for a box at the beginning of your meal and put the rest of your entree in it so you are not tempted to eat it.  If calories are listed on the menu, choose the lower calorie options.  Choose dishes that include vegetables, fruits, whole grains, low-fat dairy products, and lean protein.  Choose items that are boiled, broiled, grilled, or steamed. Stay away from items that are buttered, battered, fried, or served with cream sauce. Items labeled "crispy" are usually fried, unless stated otherwise.  Choose water, low-fat milk, unsweetened iced tea, or other drinks without added sugar. If you want an alcoholic beverage, choose a lower calorie option such as a glass of wine or light beer.  Ask for dressings, sauces, and syrups on the side. These are usually high in calories, so you should limit the amount you eat.  If you want a salad, choose a garden salad and ask for grilled meats. Avoid extra toppings like bacon, cheese, or fried items. Ask for the dressing on the side, or ask for olive oil and vinegar or lemon to use as dressing.  Estimate how many servings of a  food you are given. For example, a serving of cooked rice is  cup or about the size of half a baseball. Knowing serving sizes will help you be aware of how much food you are eating at restaurants. The list below tells you how big or small some common portion sizes are based on everyday objects: ? 1 oz-4 stacked dice. ? 3 oz-1 deck of cards. ? 1 tsp-1 die. ? 1 Tbsp- a ping-pong ball. ? 2 Tbsp-1 ping-pong ball. ?  cup- baseball. ? 1 cup-1 baseball. Summary  Calorie counting means keeping track of how many calories you eat and drink each day. If you eat fewer calories than your body needs, you should lose weight.  A healthy amount of weight to lose per week is usually 1-2 lb (0.5-0.9 kg). This usually means reducing your daily calorie intake by 500-750 calories.  The number of calories in a food can be found on a Nutrition Facts label. If a food does not have a Nutrition Facts label, try to look up the calories online or ask your dietitian for help.  Use your calories on foods and drinks that will fill you up, and not on foods and drinks that will leave you hungry.  Use smaller plates, glasses, and bowls to prevent overeating. This information is not intended to replace advice given to you by your health care provider. Make sure you discuss any questions you have with your health care provider. Document Released: 01/28/2005 Document Revised: 12/29/2015 Document Reviewed: 12/29/2015 Elsevier Interactive Patient Education  [employer].

## 2017-10-10 NOTE — Progress Notes (Signed)
Subjective:  Patient ID: Ruth [name], female    DOB: 1960/06/25  Age: 57 y.o. MRN: [id number]  CC: Follow-up (follow up weight loss. pt sometimes forget to take Belviq but doesnt seem to help. no appt with weight loss clinic yet, exercise: just walk at work right now.  )   HPI  Weight loss Management: Current use of belviq once a day, eventhough it was prescribed twice a day.  no weight loss noted at this time. Gained 6Lbs since last OV Has not made any changes to diet Minimal exercise due to fatigue after work. Reports she walks abotu 15-31mins at work.  Reviewed past Medical, Social and Family history today.  Outpatient Medications Prior to Visit  Medication Sig Dispense Refill  . atenolol (TENORMIN) 50 MG tablet Take 1 tablet (50 mg total) by mouth daily. 90 tablet 1  . glipiZIDE (GLUCOTROL) 5 MG tablet Take 1 tablet (5 mg total) by mouth daily before breakfast. 90 tablet 1  . hydrochlorothiazide (HYDRODIURIL) 12.5 MG tablet Take 1 tablet (12.5 mg total) by mouth daily. 90 tablet 1  . metFORMIN (GLUCOPHAGE) 500 MG tablet Take 1 tablet (500 mg total) by mouth 2 (two) times daily with a meal. 180 tablet 1  . Multiple Vitamins-Minerals (MULTIVITAMIN WITH MINERALS) tablet Take 1 tablet by mouth daily.    Marland Kitchen lisinopril (PRINIVIL,ZESTRIL) 10 MG tablet Take 1 tablet (10 mg total) by mouth daily. 90 tablet 1  . Lorcaserin HCl (BELVIQ) 10 MG TABS Take 1 tablet by mouth 2 (two) times daily after a meal. 60 tablet 0  . cyclobenzaprine (FLEXERIL) 10 MG tablet Take 1 tablet (10 mg total) by mouth 3 (three) times daily as needed for muscle spasms. (Patient not taking: Reported on 10/10/2017) 30 tablet 0  . meloxicam (MOBIC) 15 MG tablet Take 1 tablet (15 mg total) by mouth daily. (Patient not taking: Reported on 10/10/2017) 30 tablet 0   No facility-administered medications prior to visit.     ROS Review of Systems  Constitutional: Negative for weight loss.  Respiratory: Negative for  shortness of breath.   Cardiovascular: Negative for chest pain, palpitations and leg swelling.  Gastrointestinal: Negative for abdominal pain, constipation, diarrhea and nausea.  Genitourinary: Negative for frequency and urgency.  Neurological: Negative for headaches.  Psychiatric/Behavioral: Negative for depression and suicidal ideas. The patient is not nervous/anxious and does not have insomnia.     Objective:  BP 132/80   Pulse 76   Temp 97.9 F (36.6 C) (Oral)   Ht 5\' 4"  (1.626 m)   Wt (!) 336 lb (152.4 kg)   SpO2 96%   BMI 57.67 kg/m   BP Readings from Last 3 Encounters:  10/10/17 132/80  08/22/17 136/80  08/11/17 134/86    Wt Readings from Last 3 Encounters:  10/10/17 (!) 336 lb (152.4 kg)  08/22/17 (!) 330 lb 12.8 oz (150 kg)  08/11/17 (!) 328 lb (148.8 kg)    Physical Exam  Constitutional: She is oriented to person, place, and time.  Cardiovascular: Normal rate, regular rhythm and normal heart sounds.  Pulmonary/Chest: Effort normal and breath sounds normal.  Musculoskeletal: She exhibits no edema.  Neurological: She is alert and oriented to person, place, and time.  Psychiatric: She has a normal mood and affect. Ruth [name] behavior is normal. Thought content normal.  Vitals reviewed.   Lab Results  Component Value Date   WBC 8.1 08/22/2017   HGB 12.6 08/22/2017   HCT 39.5 08/22/2017   PLT 275  08/22/2017   GLUCOSE 155 (H) 08/22/2017   CHOL 186 08/22/2017   TRIG 50 08/22/2017   HDL 62 08/22/2017   LDLCALC 114 (H) 08/22/2017   ALT 19 08/22/2017   AST 18 08/22/2017   NA 140 08/22/2017   K 4.8 08/22/2017   CL 101 08/22/2017   CREATININE 0.62 08/22/2017   BUN 21 08/22/2017   CO2 24 08/22/2017   TSH 1.730 08/22/2017   HGBA1C 6.5 (H) 08/22/2017   MICROALBUR <0.7 08/02/2016    Mm 3d Screen Breast Bilateral  Result Date: 09/17/2017 CLINICAL DATA:  Screening. EXAM: DIGITAL SCREENING BILATERAL MAMMOGRAM WITH TOMO AND CAD COMPARISON:  None. ACR Breast Density  Category a: The breast tissue is almost entirely fatty. FINDINGS: There are no findings suspicious for malignancy. Images were processed with CAD. IMPRESSION: No mammographic evidence of malignancy. A result letter of this screening mammogram will be mailed directly to the patient. RECOMMENDATION: Screening mammogram in one year. (Code:SM-B-01Y) BI-RADS CATEGORY  1: Negative. Electronically Signed   By: Nolon Nations M.D.   On: 09/17/2017 08:33    Assessment & Plan:   Ruth [name] was seen today for follow-up.  Diagnoses and all orders for this visit:  Encounter for weight loss counseling -     Lorcaserin HCl ER (BELVIQ XR) 20 MG TB24; Take 1 tablet by mouth daily. With food  HTN (hypertension), benign  Class 3 severe obesity with serious comorbidity and body mass index (BMI) of 50.0 to 59.9 in adult, unspecified obesity type (HCC) -     Lorcaserin HCl ER (BELVIQ XR) 20 MG TB24; Take 1 tablet by mouth daily. With food   I have discontinued Barron Schmid. Ruth [name]'s Lorcaserin HCl and lisinopril. I am also having Ruth [name] start on Lorcaserin HCl ER. Additionally, I am having Ruth [name] maintain Ruth [name] multivitamin with minerals, cyclobenzaprine, meloxicam, glipiZIDE, metFORMIN, atenolol, and hydrochlorothiazide.  Meds ordered this encounter  Medications  . Lorcaserin HCl ER (BELVIQ XR) 20 MG TB24    Sig: Take 1 tablet by mouth daily. With food    Dispense:  30 tablet    Refill:  1    Order Specific Question:   Supervising Provider    Answer:   MATTHEWS, CODY [[id number]]    Follow-up: Return in about 4 weeks (around 11/07/2017) for weight loss and HTN.  [name], NP

## 2017-10-13 ENCOUNTER — Encounter: Payer: Self-pay | Admitting: Nurse Practitioner

## 2017-10-14 ENCOUNTER — Telehealth: Payer: Self-pay | Admitting: Nurse Practitioner

## 2017-10-14 NOTE — Telephone Encounter (Signed)
PA for Belviq 20 mg approved. Notified pharmacy.

## 2017-10-17 DIAGNOSIS — H401111 Primary open-angle glaucoma, right eye, mild stage: Secondary | ICD-10-CM | POA: Diagnosis not present

## 2017-10-17 LAB — HM DIABETES EYE EXAM

## 2017-10-27 ENCOUNTER — Emergency Department (HOSPITAL_COMMUNITY): Payer: BLUE CROSS/BLUE SHIELD

## 2017-10-27 ENCOUNTER — Emergency Department (HOSPITAL_COMMUNITY)
Admission: EM | Admit: 2017-10-27 | Discharge: 2017-10-27 | Disposition: A | Discharge status: Home / Self Care | Payer: BLUE CROSS/BLUE SHIELD | Attending: Emergency Medicine | Admitting: Emergency Medicine

## 2017-10-27 DIAGNOSIS — Z7984 Long term (current) use of oral hypoglycemic drugs: Secondary | ICD-10-CM | POA: Diagnosis not present

## 2017-10-27 DIAGNOSIS — I771 Stricture of artery: Secondary | ICD-10-CM

## 2017-10-27 DIAGNOSIS — Q2546 Tortuous aortic arch: Secondary | ICD-10-CM | POA: Insufficient documentation

## 2017-10-27 DIAGNOSIS — Z87891 Personal history of nicotine dependence: Secondary | ICD-10-CM | POA: Insufficient documentation

## 2017-10-27 DIAGNOSIS — E119 Type 2 diabetes mellitus without complications: Secondary | ICD-10-CM | POA: Insufficient documentation

## 2017-10-27 DIAGNOSIS — M545 Low back pain: Secondary | ICD-10-CM | POA: Diagnosis not present

## 2017-10-27 DIAGNOSIS — Z79899 Other long term (current) drug therapy: Secondary | ICD-10-CM | POA: Insufficient documentation

## 2017-10-27 DIAGNOSIS — I10 Essential (primary) hypertension: Secondary | ICD-10-CM | POA: Insufficient documentation

## 2017-10-27 DIAGNOSIS — R0789 Other chest pain: Secondary | ICD-10-CM | POA: Diagnosis not present

## 2017-10-27 DIAGNOSIS — M791 Myalgia, unspecified site: Secondary | ICD-10-CM | POA: Diagnosis not present

## 2017-10-27 DIAGNOSIS — R079 Chest pain, unspecified: Secondary | ICD-10-CM | POA: Diagnosis not present

## 2017-10-27 LAB — CBG MONITORING, ED: Glucose-Capillary: 134 mg/dL — ABNORMAL HIGH (ref 70–99)

## 2017-10-27 MED ORDER — NAPROXEN 250 MG PO TABS
500.0000 mg | ORAL_TABLET | Freq: Once | ORAL | Status: AC
  Administered 2017-10-27: 500 mg via ORAL
  Filled 2017-10-27: qty 2

## 2017-10-27 MED ORDER — METHOCARBAMOL 500 MG PO TABS: 500.0000 mg | ORAL_TABLET | Freq: Two times a day (BID) | ORAL | 0 refills | Status: AC

## 2017-10-27 MED ORDER — NAPROXEN 500 MG PO TABS: 500.0000 mg | ORAL_TABLET | Freq: Two times a day (BID) | ORAL | 0 refills | Status: AC

## 2017-10-27 MED ORDER — METHOCARBAMOL 500 MG PO TABS
750.0000 mg | ORAL_TABLET | Freq: Once | ORAL | Status: AC
  Administered 2017-10-27: 750 mg via ORAL
  Filled 2017-10-27: qty 2
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
## 2017-10-27 NOTE — ED Notes (Signed)
Patient transported to X-ray 

## 2017-10-27 NOTE — Discharge Instructions (Signed)
I have prescribed muscle relaxers for your pain, please do not drink or drive while taking this medications as they can make you drowsy.  Please follow-up with PCP in 1 week for reevaluation of your symptoms.  You experience any bowel or bladder incontinence, fever, worsening in your symptoms please return to the ED. ° °

## 2017-10-28 ENCOUNTER — Ambulatory Visit: Payer: BLUE CROSS/BLUE SHIELD | Admitting: Nurse Practitioner

## 2017-10-28 ENCOUNTER — Encounter: Payer: Self-pay | Admitting: Nurse Practitioner

## 2017-10-28 VITALS — BP 124/82 | HR 62 | Temp 98.3°F | Ht 64.0 in | Wt 334.2 lb

## 2017-10-28 DIAGNOSIS — I10 Essential (primary) hypertension: Secondary | ICD-10-CM

## 2017-10-28 DIAGNOSIS — E119 Type 2 diabetes mellitus without complications: Secondary | ICD-10-CM

## 2017-10-28 DIAGNOSIS — E782 Mixed hyperlipidemia: Secondary | ICD-10-CM

## 2017-10-28 MED ORDER — LISINOPRIL 10 MG PO TABS: 10.0000 mg | ORAL_TABLET | Freq: Every day | ORAL | 3 refills | Status: DC

## 2017-10-28 MED ORDER — HYDROCHLOROTHIAZIDE 12.5 MG PO TABS: 12.5000 mg | ORAL_TABLET | Freq: Every day | ORAL | 3 refills | Status: DC

## 2017-10-28 MED ORDER — ATENOLOL 50 MG PO TABS: 50.0000 mg | ORAL_TABLET | Freq: Every day | ORAL | 3 refills | Status: DC

## 2017-10-28 MED ORDER — ATORVASTATIN CALCIUM 20 MG PO TABS: 20.0000 mg | ORAL_TABLET | Freq: Every day | ORAL | 3 refills | Status: DC
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
## 2017-10-28 NOTE — Patient Instructions (Addendum)
Push next office visit to 57month from today.  We dicussed her 57yrs cardiovascular risk due to age and risk factors. We also discussed ways to minimize her risk. Diabetes and HTN is under control, but need to add statin and continue to work of weight loss. She was advised about possible side effects of statin drugs. She agreed to start statin.   Heart Disease Prevention Heart disease is a leading cause of death. There are many things you can do to help prevent heart disease. Be physically active Physical activity is good for your heart. It helps control your blood pressure, cholesterol levels, and weight. Try to be physically active every day. Ask your health care provider what activities are best for you. Be a healthy weight Extra weight can strain your heart and affect your blood pressure and cholesterol levels. Lose weight with diet and exercise if recommended by your health care provider. Eat heart-healthy foods Follow a healthy eating plan as recommended by your health care provider or dietitian. Heart-healthy foods include:  High-fiber foods. These include oat bran, oatmeal, and whole-grain breads and cereals.  Fruits and vegetables.  Avoid:  Alcohol.  Fried foods.  Foods high in saturated fat. These include meats, butter, whole dairy products, shortening, and coconut or palm oil.  Salty foods. These include canned food, luncheon meat, salty snacks, and fast food.  Keep your cholesterol levels under control Cholesterol is a substance that is used for many important functions. When your cholesterol levels are high, cholesterol can stick to the insides of your blood vessels, making them narrow or clog. This can lead to chest pain (angina) and a heart attack. Keep your cholesterol levels under control as recommended by your health care provider. Have your cholesterol checked at least once a year. Target cholesterol levels (in mg/dL) for most people are:  Total cholesterol below  200.  LDL cholesterol below 100.  HDL cholesterol above 40 in men and above 50 in women.  Triglycerides below 150.  Keep your blood pressure under control Having high blood pressure (hypertension) puts you at risk for stroke and other forms of heart disease. Keep your blood pressure under control as recommended by your health care provider. Ask your health care provider if you need treatment to lower your blood pressure. If you are 1-3 years of age, have your blood pressure checked every 3-5 years. If you are 49 years of age or older, have your blood pressure checked every year. Do not use tobacco products Tobacco smoke can damage your heart and blood vessels. Do not use any tobacco products including cigarettes, chewing tobacco, or electronic cigarettes. If you need help quitting, ask your health care provider. Take medicines as directed Take medicines only as directed by your health care provider. Ask your health care provider whether you should take an aspirin every day. Taking aspirin can help reduce your risk of heart disease and stroke. Where to find more information: To find out more about heart disease, visit the American Heart Association's website at www.americanheart.org This information is not intended to replace advice given to you by your health care provider. Make sure you discuss any questions you have with your health care provider. Document Released: 09/12/2003 Document Revised: 06/28/2015 Document Reviewed: 03/24/2013 Elsevier Interactive Patient Education  [employer].

## 2017-10-30 ENCOUNTER — Encounter (HOSPITAL_COMMUNITY): Payer: Self-pay | Admitting: Emergency Medicine

## 2017-10-30 ENCOUNTER — Emergency Department (HOSPITAL_COMMUNITY)
Admission: EM | Admit: 2017-10-30 | Discharge: 2017-10-30 | Disposition: A | Discharge status: Home / Self Care | Payer: BLUE CROSS/BLUE SHIELD | Attending: Emergency Medicine | Admitting: Emergency Medicine

## 2017-10-30 ENCOUNTER — Other Ambulatory Visit: Payer: Self-pay

## 2017-10-30 DIAGNOSIS — I10 Essential (primary) hypertension: Secondary | ICD-10-CM | POA: Insufficient documentation

## 2017-10-30 DIAGNOSIS — E119 Type 2 diabetes mellitus without complications: Secondary | ICD-10-CM | POA: Diagnosis not present

## 2017-10-30 DIAGNOSIS — T783XXA Angioneurotic edema, initial encounter: Secondary | ICD-10-CM | POA: Insufficient documentation

## 2017-10-30 DIAGNOSIS — Z7984 Long term (current) use of oral hypoglycemic drugs: Secondary | ICD-10-CM | POA: Diagnosis not present

## 2017-10-30 DIAGNOSIS — Z79899 Other long term (current) drug therapy: Secondary | ICD-10-CM | POA: Diagnosis not present

## 2017-10-30 DIAGNOSIS — Z87891 Personal history of nicotine dependence: Secondary | ICD-10-CM | POA: Insufficient documentation

## 2017-10-30 DIAGNOSIS — R130 Aphagia: Secondary | ICD-10-CM | POA: Diagnosis not present

## 2017-10-30 LAB — BASIC METABOLIC PANEL
ANION GAP: 9 (ref 5–15)
BUN: 15 mg/dL (ref 6–20)
CALCIUM: 9.3 mg/dL (ref 8.9–10.3)
CHLORIDE: 102 mmol/L (ref 98–111)
CO2: 29 mmol/L (ref 22–32)
CREATININE: 0.69 mg/dL (ref 0.44–1.00)
GFR calc Af Amer: >60 mL/min (ref >60)
GFR calc non Af Amer: >60 mL/min (ref >60)
Glucose, Bld: 127 mg/dL — ABNORMAL HIGH (ref 70–99)
Potassium: 4.2 mmol/L (ref 3.5–5.1)
Sodium: 140 mmol/L (ref 135–145)

## 2017-10-30 LAB — CBC
HCT: 39.4 % (ref 36.0–46.0)
HEMOGLOBIN: 12.7 g/dL (ref 12.0–15.0)
MCH: 29.7 pg (ref 26.0–34.0)
MCHC: 32.2 g/dL (ref 30.0–36.0)
MCV: 92.3 fL (ref 78.0–100.0)
Platelets: 253 10*3/uL (ref 150–400)
RBC: 4.27 MIL/uL (ref 3.87–5.11)
RDW: 14.2 % (ref 11.5–15.5)
WBC: 7.4 10*3/uL (ref 4.0–10.5)

## 2017-10-30 MED ORDER — DIPHENHYDRAMINE HCL 50 MG/ML IJ SOLN
25.0000 mg | Freq: Once | INTRAMUSCULAR | Status: AC
Start: 2017-10-30 — End: 2017-10-30
  Administered 2017-10-30: 25 mg via INTRAVENOUS
  Filled 2017-10-30: qty 1

## 2017-10-30 MED ORDER — METHYLPREDNISOLONE SODIUM SUCC 125 MG IJ SOLR
125.0000 mg | Freq: Once | INTRAMUSCULAR | Status: AC
Start: 2017-10-30 — End: 2017-10-30
  Administered 2017-10-30: 125 mg via INTRAVENOUS
  Filled 2017-10-30: qty 2

## 2017-10-30 MED ORDER — BLOOD GLUCOSE MONITOR KIT: PACK | 0 refills | Status: DC

## 2017-10-30 MED ORDER — FAMOTIDINE IN NACL 20-0.9 MG/50ML-% IV SOLN
20.0000 mg | Freq: Once | INTRAVENOUS | Status: AC
Start: 2017-10-30 — End: 2017-10-30
  Administered 2017-10-30: 20 mg via INTRAVENOUS
  Filled 2017-10-30: qty 50

## 2017-10-30 NOTE — ED Provider Notes (Signed)
[facility] DEPT Provider Note   CSN: [id number] Arrival date & time: 10/30/17  1594     History   Chief Complaint Chief Complaint  Patient presents with  . Angioedema    HPI Ruth [name] is a 57 y.o. female.  Pt presents to the ED today with swelling to the left side of her tongue.  Pt said she woke up this morning with these sx. She denies sob, but said it's hard to swallow.   The pt said she takes Lisinopril, but this has never happened before.  The pt did not take any meds prior to arrival.     Past Medical History:  Diagnosis Date  . Anemia   . Arthritis   . Diabetes mellitus without complication ([place])    type 2  . Hyperlipidemia   . Hypertension   . Obesity     Patient Active Problem List   Diagnosis Date Noted  . Acute right-sided low back pain with right-sided sciatica 08/11/2017  . Hematochezia   . Benign neoplasm of colon   . Benign neoplasm of transverse colon   . Benign neoplasm of descending colon   . Benign neoplasm of rectum   . Lumbar paraspinal muscle spasm 07/31/2016  . Diabetes ([place]) 04/29/2016  . HTN (hypertension), benign 04/29/2016  . Hyperlipidemia 04/29/2016  . Morbid obesity with BMI of 50.0-59.9, adult ([place]) 04/29/2016    Past Surgical History:  Procedure Laterality Date  . ABDOMINAL HYSTERECTOMY     partial  . CHOLECYSTECTOMY    . COLONOSCOPY WITH PROPOFOL N/A 10/29/2016   Procedure: COLONOSCOPY WITH PROPOFOL;  Surgeon: Yetta Flock, MD;  Location: [facility] ENDOSCOPY;  Service: Gastroenterology;  Laterality: N/A;  . DENTAL SURGERY    . ORIF TIBIA & FIBULA FRACTURES Left      OB History   None      Home Medications    Prior to Admission medications   Medication Sig Start Date End Date Taking? Authorizing Provider  atenolol (TENORMIN) 50 MG tablet Take 1 tablet (50 mg total) by mouth daily. 10/28/17  Yes Nche, Charlene Brooke, NP  glipiZIDE (GLUCOTROL XL) 10 MG 24 hr tablet Take 10 mg by  mouth daily with breakfast.   Yes [provider]  hydrochlorothiazide (HYDRODIURIL) 12.5 MG tablet Take 1 tablet (12.5 mg total) by mouth daily. 10/28/17  Yes Nche, Charlene Brooke, NP  latanoprost (XALATAN) 0.005 % ophthalmic solution Place 1 drop into both eyes at bedtime.  10/17/17  Yes [provider]  Lorcaserin HCl ER (BELVIQ XR) 20 MG TB24 Take 1 tablet by mouth daily. With food Patient taking differently: Take 20 mg by mouth daily.  10/10/17  Yes Nche, Charlene Brooke, NP  metFORMIN (GLUCOPHAGE) 500 MG tablet Take 1 tablet (500 mg total) by mouth daily with breakfast. 10/28/17  Yes Nche, Charlene Brooke, NP  methocarbamol (ROBAXIN) 500 MG tablet Take 1 tablet (500 mg total) by mouth 2 (two) times daily for 7 days. Patient taking differently: Take 500 mg by mouth 2 (two) times daily as needed for muscle spasms.  10/27/17 11/03/17 Yes Soto, Beverley Fiedler, PA-C  Multiple Vitamins-Minerals (MULTIVITAMIN WITH MINERALS) tablet Take 1 tablet by mouth daily.   Yes [provider]  naproxen (NAPROSYN) 500 MG tablet Take 1 tablet (500 mg total) by mouth 2 (two) times daily for 7 days. Patient taking differently: Take 500 mg by mouth 2 (two) times daily as needed for mild pain or moderate pain.  10/27/17 11/03/17 Yes  Janeece Fitting, PA-C  atorvastatin (LIPITOR) 20 MG tablet Take 1 tablet (20 mg total) by mouth daily at 6 PM. 10/28/17   Nche, Charlene Brooke, NP  blood glucose meter kit and supplies KIT Dispense based on patient and insurance preference. Use up to four times daily as directed. (FOR ICD-9 250.00, 250.01). 10/30/17   Isla Pence, MD    Family History Family History  Problem Relation Age of Onset  . Diabetes Mother   . Stroke Mother   . Dementia Mother   . Hypertension Mother   . Pancreatic cancer Father   . Colon polyps Sister   . Cancer Maternal Grandmother        type unknown    Social History Social History   Tobacco Use  . Smoking status: Former Smoker     Packs/day: 1.00    Years: 20.00    Pack years: 20.00    Last attempt to quit: 02/12/2008    Years since quitting: 9.7  . Smokeless tobacco: Never Used  Substance Use Topics  . Alcohol use: No  . Drug use: No     Allergies   Lisinopril and Tape   Review of Systems Review of Systems  HENT:       Tongue swelling  All other systems reviewed and are negative.    Physical Exam Updated Vital Signs BP (!) 144/85   Pulse 64   Resp 17   SpO2 98%   Physical Exam  Constitutional: She is oriented to person, place, and time. She appears well-developed and well-nourished.  HENT:  Right Ear: External ear normal.  Left Ear: External ear normal.  Nose: Nose normal.  Left tongue and lip swollen.  No posterior pharyngeal swelling.  Eyes: Pupils are equal, round, and reactive to light. Conjunctivae and EOM are normal.  Neck: Normal range of motion. Neck supple.  Cardiovascular: Normal rate, regular rhythm, normal heart sounds and intact distal pulses.  Pulmonary/Chest: Effort normal and breath sounds normal.  Abdominal: Soft. Bowel sounds are normal.  Musculoskeletal: Normal range of motion.  Neurological: She is alert and oriented to person, place, and time.  Skin: Skin is warm. Capillary refill takes less than 2 seconds.  Psychiatric: She has a normal mood and affect. Her behavior is normal. Judgment and thought content normal.  Nursing note and vitals reviewed.    ED Treatments / Results  Labs (all labs ordered are listed, but only abnormal results are displayed) Labs Reviewed  BASIC METABOLIC PANEL - Abnormal; Notable for the following components:      Result Value   Glucose, Bld 127 (*)    All other components within normal limits  CBC    EKG EKG Interpretation  Date/Time:  Thursday October 30 2017 07:49:00 EDT Ventricular Rate:  62 PR Interval:    QRS Duration: 81 QT Interval:  386 QTC Calculation: 392 R Axis:   43 Text Interpretation:  Sinus rhythm Low  voltage, precordial leads No significant change since last tracing Confirmed by Isla Pence [phone]) on 10/30/2017 8:21:16 AM   Radiology No results found.  Procedures Procedures (including critical care time)  Medications Ordered in ED Medications  diphenhydrAMINE (BENADRYL) injection 25 mg (25 mg Intravenous Given 10/30/17 0722)  methylPREDNISolone sodium succinate (SOLU-MEDROL) 125 mg/2 mL injection 125 mg (125 mg Intravenous Given 10/30/17 0721)  famotidine (PEPCID) IVPB 20 mg premix (0 mg Intravenous Stopped 10/30/17 0756)     Initial Impression / Assessment and Plan / ED Course  I have reviewed  the triage vital signs and the nursing notes.  Pertinent labs & imaging results that were available during my care of the patient were reviewed by me and considered in my medical decision making (see chart for details).    CRITICAL CARE Performed by: Isla Pence   Total critical care time: 30 minutes  Critical care time was exclusive of separately billable procedures and treating other patients.  Critical care was necessary to treat or prevent imminent or life-threatening deterioration.  Critical care was time spent personally by me on the following activities: development of treatment plan with patient and/or surrogate as well as nursing, discussions with consultants, evaluation of patient's response to treatment, examination of patient, obtaining history from patient or surrogate, ordering and performing treatments and interventions, ordering and review of laboratory studies, ordering and review of radiographic studies, pulse oximetry and re-evaluation of patient's condition.  Pt was observed for several hours.  Swelling has completely gone away with treatment.  Pt is [place] to eat and drink.  Lisinopril is the likely cause.  She is told not to take it again and it is added to her allergy list.  Pt knows to keep a close eye on her blood sugar as she received steroids.  She knows to  return if worse.  F/u with pcp.  Final Clinical Impressions(s) / ED Diagnoses   Final diagnoses:  Angioedema, initial encounter    ED Discharge Orders         Ordered    blood glucose meter kit and supplies KIT     10/30/17 1132           Isla Pence, MD 10/30/17 1135

## 2017-10-30 NOTE — ED Triage Notes (Addendum)
Patient presents to the ED with a swollen tongue. Patient states she woke up out of sleep with it. Patient does take lisinopril. EDP at bedside. Patient speech is garbled.

## 2017-10-30 NOTE — Discharge Instructions (Signed)
Do not take lisinopril any more.

## 2017-10-31 ENCOUNTER — Encounter: Payer: Self-pay | Admitting: Nurse Practitioner

## 2017-10-31 ENCOUNTER — Ambulatory Visit: Payer: BLUE CROSS/BLUE SHIELD | Admitting: Nurse Practitioner

## 2017-10-31 VITALS — BP 130/80 | HR 70 | Temp 98.4°F | Ht 64.0 in | Wt 334.0 lb

## 2017-10-31 DIAGNOSIS — M5137 Other intervertebral disc degeneration, lumbosacral region: Secondary | ICD-10-CM | POA: Diagnosis not present

## 2017-10-31 DIAGNOSIS — M51379 Other intervertebral disc degeneration, lumbosacral region without mention of lumbar back pain or lower extremity pain: Secondary | ICD-10-CM

## 2017-10-31 DIAGNOSIS — G8929 Other chronic pain: Secondary | ICD-10-CM | POA: Insufficient documentation

## 2017-10-31 DIAGNOSIS — M4807 Spinal stenosis, lumbosacral region: Secondary | ICD-10-CM | POA: Diagnosis not present

## 2017-10-31 DIAGNOSIS — M5441 Lumbago with sciatica, right side: Secondary | ICD-10-CM | POA: Diagnosis not present

## 2017-10-31 DIAGNOSIS — Z79899 Other long term (current) drug therapy: Secondary | ICD-10-CM

## 2017-10-31 DIAGNOSIS — I10 Essential (primary) hypertension: Secondary | ICD-10-CM

## 2017-10-31 DIAGNOSIS — Z0279 Encounter for issue of other medical certificate: Secondary | ICD-10-CM

## 2017-10-31 MED ORDER — AMLODIPINE BESYLATE 5 MG PO TABS: 5.0000 mg | ORAL_TABLET | Freq: Every day | ORAL | 3 refills | Status: DC

## 2017-10-31 NOTE — Progress Notes (Signed)
Subjective:  Patient ID: Ruth [name], female    DOB: 1960/05/25  Age: 58 y.o. MRN: [id number]  CC: Follow-up (ED follow up/allergic to lisinipril? ) and Back Pain (back pain--still has pain)   HPI  HTN:  stable with atenolol and HCTZ. Resolved tongue swelling. BP Readings from Last 3 Encounters:  10/31/17 130/80  10/30/17 136/81  10/28/17 124/82   Chronic Back pain: Onset 2016 Exacerbation with prolong sitting or standing or lifting. MRI done 2016: DDD with disc bulging and foramen stenosis. Inadequate pain control with NSAIDs and muscle relaxant. Moderated improvement with IM depomedrol. She is not interested in any surgical intervention at this time. PMP database reviewed today. No controlled substance dispensed in last 7yr. She agreed to chronic pain management with tramadol. Contract explained and she signed. Sent to lab for UDS collection  Reviewed past Medical, Social and Family history today.  Outpatient Medications Prior to Visit  Medication Sig Dispense Refill  . atenolol (TENORMIN) 50 MG tablet Take 1 tablet (50 mg total) by mouth daily. 90 tablet 3  . atorvastatin (LIPITOR) 20 MG tablet Take 1 tablet (20 mg total) by mouth daily at 6 PM. 90 tablet 3  . blood glucose meter kit and supplies KIT Dispense based on patient and insurance preference. Use up to four times daily as directed. (FOR ICD-9 250.00, 250.01). 1 each 0  . glipiZIDE (GLUCOTROL XL) 10 MG 24 hr tablet Take 10 mg by mouth daily with breakfast.    . hydrochlorothiazide (HYDRODIURIL) 12.5 MG tablet Take 1 tablet (12.5 mg total) by mouth daily. 90 tablet 3  . latanoprost (XALATAN) 0.005 % ophthalmic solution Place 1 drop into both eyes at bedtime.   11  . Lorcaserin HCl ER (BELVIQ XR) 20 MG TB24 Take 1 tablet by mouth daily. With food (Patient taking differently: Take 20 mg by mouth daily. ) 30 tablet 1  . metFORMIN (GLUCOPHAGE) 500 MG tablet Take 1 tablet (500 mg total) by mouth daily with breakfast.  90 tablet 3  . methocarbamol (ROBAXIN) 500 MG tablet Take 1 tablet (500 mg total) by mouth 2 (two) times daily for 7 days. (Patient taking differently: Take 500 mg by mouth 2 (two) times daily as needed for muscle spasms. ) 14 tablet 0  . Multiple Vitamins-Minerals (MULTIVITAMIN WITH MINERALS) tablet Take 1 tablet by mouth daily.    . naproxen (NAPROSYN) 500 MG tablet Take 1 tablet (500 mg total) by mouth 2 (two) times daily for 7 days. (Patient taking differently: Take 500 mg by mouth 2 (two) times daily as needed for mild pain or moderate pain. ) 14 tablet 0   No facility-administered medications prior to visit.     ROS See HPI  Objective:  BP 130/80   Pulse 70   Temp 98.4 F (36.9 C) (Oral)   Ht '5\' 4"'$  (1.626 m)   Wt (!) 334 lb (151.5 kg)   SpO2 95%   BMI 57.33 kg/m   BP Readings from Last 3 Encounters:  10/31/17 130/80  10/30/17 136/81  10/28/17 124/82    Wt Readings from Last 3 Encounters:  10/31/17 (!) 334 lb (151.5 kg)  10/28/17 (!) 334 lb 3.2 oz (151.6 kg)  10/10/17 (!) 336 lb (152.4 kg)    Physical Exam  HENT:  Nose: Nose normal.  Mouth/Throat: Uvula is midline and oropharynx is clear and moist. No posterior oropharyngeal edema or posterior oropharyngeal erythema.  Neck: Normal range of motion. Neck supple.  Cardiovascular: Normal rate and  regular rhythm.  Pulmonary/Chest: Effort normal and breath sounds normal. No stridor.  Musculoskeletal: She exhibits no edema.  Skin: No rash noted. No erythema.    Lab Results  Component Value Date   WBC 7.4 10/30/2017   HGB 12.7 10/30/2017   HCT 39.4 10/30/2017   PLT 253 10/30/2017   GLUCOSE 127 (H) 10/30/2017   CHOL 186 08/22/2017   TRIG 50 08/22/2017   HDL 62 08/22/2017   LDLCALC 114 (H) 08/22/2017   ALT 19 08/22/2017   AST 18 08/22/2017   NA 140 10/30/2017   K 4.2 10/30/2017   CL 102 10/30/2017   CREATININE 0.69 10/30/2017   BUN 15 10/30/2017   CO2 29 10/30/2017   TSH 1.730 08/22/2017   HGBA1C 6.5 (H)  08/22/2017   MICROALBUR <0.7 08/02/2016    Assessment & Plan:   Ruth [name] was seen today for follow-up and back pain.  Diagnoses and all orders for this visit:  HTN (hypertension), benign -     amLODipine (NORVASC) 5 MG tablet; Take 1 tablet (5 mg total) by mouth at bedtime.  DDD (degenerative disc disease), lumbosacral -     Pain Mgmt, Profile 8 w/Conf, U -     traMADol (ULTRAM) 50 MG tablet; Take 1 tablet (50 mg total) by mouth daily as needed for severe pain (for chronic back pain secondary to spinal stenosis and disc herniation).  Spinal stenosis of lumbosacral region -     Pain Mgmt, Profile 8 w/Conf, U -     traMADol (ULTRAM) 50 MG tablet; Take 1 tablet (50 mg total) by mouth daily as needed for severe pain (for chronic back pain secondary to spinal stenosis and disc herniation).  Controlled substance agreement signed -     Pain Mgmt, Profile 8 w/Conf, U -     traMADol (ULTRAM) 50 MG tablet; Take 1 tablet (50 mg total) by mouth daily as needed for severe pain (for chronic back pain secondary to spinal stenosis and disc herniation).  Chronic bilateral low back pain with right-sided sciatica -     Pain Mgmt, Profile 8 w/Conf, U -     traMADol (ULTRAM) 50 MG tablet; Take 1 tablet (50 mg total) by mouth daily as needed for severe pain (for chronic back pain secondary to spinal stenosis and disc herniation).   I am having [name] start on amLODipine and traMADol. I am also having Ruth [name] maintain Ruth [name] multivitamin with minerals, Lorcaserin HCl ER, latanoprost, methocarbamol, naproxen, metFORMIN, atenolol, hydrochlorothiazide, atorvastatin, glipiZIDE, and blood glucose meter kit and supplies.  Meds ordered this encounter  Medications  . amLODipine (NORVASC) 5 MG tablet    Sig: Take 1 tablet (5 mg total) by mouth at bedtime.    Dispense:  30 tablet    Refill:  3    Order Specific Question:   Supervising Provider    Answer:   MATTHEWS, CODY [[id number]]  . traMADol (ULTRAM) 50 MG  tablet    Sig: Take 1 tablet (50 mg total) by mouth daily as needed for severe pain (for chronic back pain secondary to spinal stenosis and disc herniation).    Dispense:  30 tablet    Refill:  0    Order Specific Question:   Supervising Provider    Answer:   Lucille Passy [[id number]]    Follow-up: Return in about 4 weeks (around 11/28/2017) for weight loss management.  [name], NP

## 2017-10-31 NOTE — Patient Instructions (Addendum)
Start amlodipine if BP >130/80 for more than 2days.  Check BP once a day.  Send BP reading through [employer] in 1week.  FMLA form will be completed for 2days per month for chronic back pain.  Go to lab and provide urine sample. Will send tramadol prescription after review of urine drug screen.   Chronic Back Pain When back pain lasts longer than 3 months, it is called chronic back pain.The cause of your back pain may not be known. Some common causes include:  Wear and tear (degenerative disease) of the bones, ligaments, or disks in your back.  Inflammation and stiffness in your back (arthritis).  People who have chronic back pain often go through certain periods in which the pain is more intense (flare-ups). Many people can learn to manage the pain with home care. Follow these instructions at home: Pay attention to any changes in your symptoms. Take these actions to help with your pain: Activity  Avoid bending and activities that make the problem worse.  Do not sit or stand in one place for long periods of time.  Take brief periods of rest throughout the day. This will reduce your pain. Resting in a lying or standing position is usually better than sitting to rest.  When you are resting for longer periods, mix in some mild activity or stretching between periods of rest. This will help to prevent stiffness and pain.  Get regular exercise. Ask your health care provider what activities are safe for you.  Do not lift anything that is heavier than 10 lb (4.5 kg). Always use proper lifting technique, which includes: ? Bending your knees. ? Keeping the load close to your body. ? Avoiding twisting. Managing pain  If directed, apply ice to the painful area. Your health care provider may recommend applying ice during the first 24-48 hours after a flare-up begins. ? Put ice in a plastic bag. ? Place a towel between your skin and the bag. ? Leave the ice on for 20 minutes, 2-3  times per day.  After icing, apply heat to the affected area as often as told by your health care provider. Use the heat source that your health care provider recommends, such as a moist heat pack or a heating pad. ? Place a towel between your skin and the heat source. ? Leave the heat on for 20-30 minutes. ? Remove the heat if your skin turns bright red. This is especially important if you are unable to feel pain, heat, or cold. You may have a greater risk of getting burned.  Try soaking in a warm tub.  Take over-the-counter and prescription medicines only as told by your health care provider.  Keep all follow-up visits as told by your health care provider. This is important. Contact a health care provider if:  You have pain that is not relieved with rest or medicine. Get help right away if:  You have weakness or numbness in one or both of your legs or feet.  You have trouble controlling your bladder or your bowels.  You have nausea or vomiting.  You have pain in your abdomen.  You have shortness of breath or you faint. This information is not intended to replace advice given to you by your health care provider. Make sure you discuss any questions you have with your health care provider. Document Released: 03/07/2004 Document Revised: 06/08/2015 Document Reviewed: 07/18/2014 Elsevier Interactive Patient Education  2018 [employer].

## 2017-10-31 NOTE — Assessment & Plan Note (Signed)
Onset 2016 MRI done 2016: DDD with disc bulging and foramen stenosis. Inadequate pain control with NSAIDs and muscle relaxant. Moderated improvement with IM depomedrol. She is not interested in any surgical intervention at this time. PMP database reviewed today. No controlled substance dispensed in last 28yrs. She agreed to chronic pain management with tramadol. Contract explained and she signed. Sent to lab for UDS collection

## 2017-11-01 LAB — PAIN MGMT, PROFILE 8 W/CONF, U
6 Acetylmorphine: NEGATIVE ng/mL (ref <10)
ALCOHOL METABOLITES: NEGATIVE ng/mL (ref <500)
AMPHETAMINES: NEGATIVE ng/mL (ref <500)
BENZODIAZEPINES: NEGATIVE ng/mL (ref <100)
Buprenorphine, Urine: NEGATIVE ng/mL (ref <5)
COCAINE METABOLITE: NEGATIVE ng/mL (ref <150)
Creatinine: 91 mg/dL
MDMA: NEGATIVE ng/mL (ref <500)
Marijuana Metabolite: NEGATIVE ng/mL (ref <20)
OXIDANT: NEGATIVE ug/mL (ref <200)
OXYCODONE: NEGATIVE ng/mL (ref <100)
Opiates: NEGATIVE ng/mL (ref <100)
pH: 5.81 (ref 4.5–9.0)

## 2017-11-03 ENCOUNTER — Encounter: Payer: Self-pay | Admitting: Nurse Practitioner

## 2017-11-03 MED ORDER — TRAMADOL HCL 50 MG PO TABS: 50.0000 mg | ORAL_TABLET | Freq: Every day | ORAL | 0 refills | Status: DC | PRN

## 2017-12-09 ENCOUNTER — Ambulatory Visit: Payer: BLUE CROSS/BLUE SHIELD | Admitting: Nurse Practitioner

## 2017-12-18 ENCOUNTER — Ambulatory Visit
Admission: RE | Admit: 2017-12-18 | Discharge: 2017-12-18 | Disposition: A | Discharge status: Home / Self Care | Payer: BLUE CROSS/BLUE SHIELD | Source: Ambulatory Visit | Attending: Family Medicine | Admitting: Family Medicine

## 2017-12-18 ENCOUNTER — Encounter: Payer: Self-pay | Admitting: Family Medicine

## 2017-12-18 ENCOUNTER — Ambulatory Visit: Payer: BLUE CROSS/BLUE SHIELD | Admitting: Family Medicine

## 2017-12-18 ENCOUNTER — Other Ambulatory Visit: Payer: Self-pay | Admitting: Family Medicine

## 2017-12-18 VITALS — BP 134/78 | HR 80 | Temp 98.3°F | Ht 64.0 in | Wt 337.0 lb

## 2017-12-18 DIAGNOSIS — N6310 Unspecified lump in the right breast, unspecified quadrant: Secondary | ICD-10-CM

## 2017-12-18 DIAGNOSIS — N644 Mastodynia: Secondary | ICD-10-CM

## 2017-12-18 DIAGNOSIS — N6311 Unspecified lump in the right breast, upper outer quadrant: Secondary | ICD-10-CM | POA: Diagnosis not present

## 2017-12-18 DIAGNOSIS — R928 Other abnormal and inconclusive findings on diagnostic imaging of breast: Secondary | ICD-10-CM | POA: Diagnosis not present

## 2017-12-18 DIAGNOSIS — N6312 Unspecified lump in the right breast, upper inner quadrant: Secondary | ICD-10-CM | POA: Diagnosis not present
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
## 2017-12-18 NOTE — Patient Instructions (Signed)
Great to meet you. Please call the breast center at [phone] to schedule your mammogram.

## 2018-01-27 ENCOUNTER — Encounter: Payer: Self-pay | Admitting: Nurse Practitioner

## 2018-02-03 ENCOUNTER — Ambulatory Visit: Payer: BLUE CROSS/BLUE SHIELD | Admitting: Nurse Practitioner

## 2018-02-03 ENCOUNTER — Telehealth: Payer: Self-pay | Admitting: Nurse Practitioner

## 2018-02-03 VITALS — BP 120/90 | HR 71 | Temp 98.4°F | Ht 64.0 in | Wt 334.2 lb

## 2018-02-03 DIAGNOSIS — Z6841 Body Mass Index (BMI) 40.0 and over, adult: Secondary | ICD-10-CM

## 2018-02-03 DIAGNOSIS — G8929 Other chronic pain: Secondary | ICD-10-CM

## 2018-02-03 DIAGNOSIS — M5441 Lumbago with sciatica, right side: Secondary | ICD-10-CM | POA: Diagnosis not present

## 2018-02-03 DIAGNOSIS — M4807 Spinal stenosis, lumbosacral region: Secondary | ICD-10-CM | POA: Diagnosis not present

## 2018-02-03 DIAGNOSIS — E119 Type 2 diabetes mellitus without complications: Secondary | ICD-10-CM | POA: Diagnosis not present

## 2018-02-03 DIAGNOSIS — M5137 Other intervertebral disc degeneration, lumbosacral region: Secondary | ICD-10-CM | POA: Diagnosis not present

## 2018-02-03 LAB — POCT GLYCOSYLATED HEMOGLOBIN (HGB A1C): Hemoglobin A1C: 6.2 % — AB (ref 4.0–5.6)

## 2018-02-03 MED ORDER — NALTREXONE-BUPROPION HCL ER 8-90 MG PO TB12: ORAL_TABLET | ORAL | 0 refills | Status: DC

## 2018-02-03 NOTE — Telephone Encounter (Signed)
Copied from [place] [id number]. Topic: Quick Communication - Rx Refill/Question >> Feb 03, 2018  1:31 PM Margot Ables wrote: Medication: Naltrexone-buPROPion HCl ER 8-90 MG TB12   Has the patient contacted their pharmacy? Yes - pt was advised that PA is needed for this medication  Preferred Pharmacy (with phone number or street name): [facility] [place], [place]

## 2018-02-03 NOTE — Patient Instructions (Addendum)
Tramadol is at your pharmacy: [facility] on [address]. Use tramadol as needed for back pain.  Start Contrave. Incorporate exercise and diet as discussed Contact weight loss clinic as discussed to help with meal planning. You will be contacted to schedule appt with Bariatric surgeon.  Have eye exam report sent to me from [facility].  Bupropion; Naltrexone extended-release tablets What is this medicine? BUPROPION; NALTREXONE (byoo PROE pee on; nal TREX one) is a combination product used to promote and maintain weight loss in obese adults or overweight adults who also have weight related medical problems. This medicine should be used with a reduced calorie diet and increased physical activity. This medicine may be used for other purposes; ask your health care provider or pharmacist if you have questions. COMMON BRAND NAME(S): Contrave What should I tell my health care provider before I take this medicine? They need to know if you have any of these conditions: -an eating disorder, such as anorexia or bulimia -bipolar disorder -diabetes -depression -drug abuse or addiction -glaucoma -head injury -heart disease -high blood pressure -history of a tumor or infection of your brain or spine -history of stroke -history of irregular heartbeat -if you often drink alcohol -kidney disease -liver disease -schizophrenia -seizures -suicidal thoughts, plans, or attempt; a previous suicide attempt by you or a family member -an unusual or allergic reaction to bupropion, naltrexone, other medicines, foods, dyes, or preservatives -breast-feeding -pregnant or trying to become pregnant How should I use this medicine? Take this medicine by mouth with a glass of water. Follow the directions on the prescription label. Take this medicine in the morning and in the evenings as directed by your healthcare professional. Ruth [name] can take it with or without food. Do not take with high-fat meals as  this may increase your risk of seizures. Do not crush, chew, or cut these tablets. Do not take your medicine more often than directed. Do not stop taking this medicine suddenly except upon the advice of your doctor. A special MedGuide will be given to you by the pharmacist with each prescription and refill. Be sure to read this information carefully each time. Talk to your pediatrician regarding the use of this medicine in children. Special care may be needed. Overdosage: If you think you have taken too much of this medicine contact a poison control center or emergency room at once. NOTE: This medicine is only for you. Do not share this medicine with others. What if I miss a dose? If you miss a dose, skip the missed dose and take your next tablet at the regular time. Do not take double or extra doses. What may interact with this medicine? Do not take this medicine with any of the following medications: -any prescription or street opioid drug like codeine, heroin, methadone -linezolid -MAOIs like Carbex, Eldepryl, Marplan, Nardil, and Parnate -methylene blue (injected into a vein) -other medicines that contain bupropion like Zyban or Wellbutrin This medicine may also interact with the following medications: -alcohol -certain medicines for anxiety or sleep -certain medicines for blood pressure like metoprolol, propranolol -certain medicines for depression or psychotic disturbances -certain medicines for HIV or AIDS like efavirenz, lopinavir, nelfinavir, ritonavir -certain medicines for irregular heart beat like propafenone, flecainide -certain medicines for Parkinson's disease like amantadine, levodopa -certain medicines for seizures like carbamazepine, phenytoin, phenobarbital -cimetidine -clopidogrel -cyclophosphamide -digoxin -disulfiram -furazolidone -isoniazid -nicotine -orphenadrine -procarbazine -steroid medicines like prednisone or cortisone -stimulant medicines for attention  disorders, weight loss, or to stay awake -tamoxifen -  theophylline -thioridazine -thiotepa -ticlopidine -tramadol -warfarin This list may not describe all possible interactions. Give your health care provider a list of all the medicines, herbs, non-prescription drugs, or dietary supplements you use. Also tell them if you smoke, drink alcohol, or use illegal drugs. Some items may interact with your medicine. What should I watch for while using this medicine? This medicine is intended to be used in addition to a healthy diet and appropriate exercise. The best results are achieved this way. Do not increase or in any way change your dose without consulting your doctor or health care professional. Do not take this medicine with other prescription or over-the-counter weight loss products without consulting your doctor or health care professional. Your doctor should tell you to stop taking this medicine if you do not lose a certain amount of weight within the first 12 weeks of treatment. Visit your doctor or health care professional for regular checkups. Your doctor may order blood tests or other tests to see how you are doing. This medicine may affect blood sugar levels. If you have diabetes, check with your doctor or health care professional before you change your diet or the dose of your diabetic medicine. Patients and their families should watch out for new or worsening depression or thoughts of suicide. Also watch out for sudden changes in feelings such as feeling anxious, agitated, panicky, irritable, hostile, aggressive, impulsive, severely restless, overly excited and hyperactive, or not being [place] to sleep. If this happens, especially at the beginning of treatment or after a change in dose, call your health care professional. Avoid alcoholic drinks while taking this medicine. Drinking large amounts of alcoholic beverages, using sleeping or anxiety medicines, or quickly stopping the use of these agents  while taking this medicine may increase your risk for a seizure. What side effects may I notice from receiving this medicine? Side effects that you should report to your doctor or health care professional as soon as possible: -allergic reactions like skin rash, itching or hives, swelling of the face, lips, or tongue -breathing problems -changes in vision -confusion -elevated mood, decreased need for sleep, racing thoughts, impulsive behavior -fast or irregular heartbeat -hallucinations, loss of contact with reality -increased blood pressure -redness, blistering, peeling or loosening of the skin, including inside the mouth -seizures -signs and symptoms of liver injury like dark yellow or brown urine; general ill feeling or flu-like symptoms; light-colored stools; loss of appetite; nausea; right upper belly pain; unusually weak or tired; yellowing of the eyes or skin -suicidal thoughts or other mood changes -vomiting Side effects that usually do not require medical attention (report to your doctor or health care professional if they continue or are bothersome): -constipation -headache -loss of appetite -indigestion, stomach upset -tremors This list may not describe all possible side effects. Call your doctor for medical advice about side effects. You may report side effects to FDA at 1-800-FDA-1088. Where should I keep my medicine? Keep out of the reach of children. Store at room temperature between 15 and 30 degrees C (59 and 86 degrees F). Throw away any unused medicine after the expiration date. NOTE: This sheet is a summary. It may not cover all possible information. If you have questions about this medicine, talk to your doctor, pharmacist, or health care provider.  2019 Elsevier/Gold Standard (2015-07-21 13:42:58)
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
## 2018-02-05 ENCOUNTER — Encounter: Payer: Self-pay | Admitting: Nurse Practitioner

## 2018-02-05 NOTE — Assessment & Plan Note (Signed)
Ruth [name] was suggested for weight loss, but she declined stating she does not want to any injections.   She was advised about possible adverse effects with Contrave. She was diagnosed with glaucoma 71months ago and xalatan drops prescribed. Advised to use eye drops as rescribed and to stop contrave if any adverse side effects. She verbalized understanding. Ruth [name] was suggested for weight loss, but she declined stating she does not want to any injections.   She was advised about possible adverse effects with Contrave. She was diagnosed with glaucoma 58months ago and xalatan drops prescribed. Advised to use eye drops as rescribed and to stop contrave if any adverse side effects. She verbalized understanding.  Start Contrave. Incorporate exercise and diet as discussed Contact weight loss clinic as discussed to help with meal planning. You will be contacted to schedule appt with Bariatric surgeon.  Have eye exam report sent to me from [facility].

## 2018-02-05 NOTE — Telephone Encounter (Signed)
PA for Contrave ER 8-90 mg tab started, waiting for result.   Ruth [name] (Key: [id number])

## 2018-02-05 NOTE — Telephone Encounter (Signed)
PA approved, notified pharmacy. Copay of $60 they will contact pt.

## 2018-02-05 NOTE — Assessment & Plan Note (Signed)
Tramadol is at your pharmacy: [facility] on [address]. Use tramadol as needed for back pain. New rx not needed.

## 2018-02-05 NOTE — Assessment & Plan Note (Signed)
Tramadol is at your pharmacy: [facility] on [address]. Use tramadol as needed for back pain.

## 2018-02-23 ENCOUNTER — Ambulatory Visit: Payer: BLUE CROSS/BLUE SHIELD | Admitting: Nurse Practitioner

## 2018-02-23 ENCOUNTER — Encounter: Payer: Self-pay | Admitting: Nurse Practitioner

## 2018-02-23 ENCOUNTER — Ambulatory Visit (INDEPENDENT_AMBULATORY_CARE_PROVIDER_SITE_OTHER): Payer: BLUE CROSS/BLUE SHIELD

## 2018-02-23 VITALS — BP 118/82 | HR 70 | Temp 98.4°F | Ht 64.0 in | Wt 334.0 lb

## 2018-02-23 DIAGNOSIS — M25471 Effusion, right ankle: Secondary | ICD-10-CM

## 2018-02-23 DIAGNOSIS — M19071 Primary osteoarthritis, right ankle and foot: Secondary | ICD-10-CM | POA: Diagnosis not present

## 2018-02-23 DIAGNOSIS — M25571 Pain in right ankle and joints of right foot: Secondary | ICD-10-CM
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
## 2018-02-23 NOTE — Patient Instructions (Addendum)
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 Entered referral to podiatry for possible injection and/or ankle brace.  Mild elevation in uric acid. Stable BMP. mobic sent for ankle pain. F/up with podiatry for additional recommendation.

## 2018-02-24 ENCOUNTER — Other Ambulatory Visit: Payer: Self-pay | Admitting: Nurse Practitioner

## 2018-02-24 ENCOUNTER — Encounter: Payer: Self-pay | Admitting: Nurse Practitioner

## 2018-02-24 DIAGNOSIS — M25571 Pain in right ankle and joints of right foot: Secondary | ICD-10-CM

## 2018-02-24 DIAGNOSIS — M25471 Effusion, right ankle: Secondary | ICD-10-CM

## 2018-02-24 LAB — BASIC METABOLIC PANEL
BUN: 18 mg/dL (ref 6–23)
CO2: 29 meq/L (ref 19–32)
Calcium: 10.2 mg/dL (ref 8.4–10.5)
Chloride: 102 mEq/L (ref 96–112)
Creatinine, Ser: 0.76 mg/dL (ref 0.40–1.20)
GFR: 100.75 mL/min (ref >60.00)
GLUCOSE: 111 mg/dL — AB (ref 70–99)
Potassium: 4 mEq/L (ref 3.5–5.1)
SODIUM: 139 meq/L (ref 135–145)

## 2018-02-24 LAB — URIC ACID: Uric Acid, Serum: 7.6 mg/dL — ABNORMAL HIGH (ref 2.4–7.0)

## 2018-02-24 MED ORDER — MELOXICAM 7.5 MG PO TABS: 7.5000 mg | ORAL_TABLET | Freq: Every day | ORAL | 0 refills | Status: DC

## 2018-02-24 NOTE — Addendum Note (Signed)
Addended by: [name] L on: 02/24/2018 02:51 PM   Modules accepted: Orders, Level of Service

## 2018-03-09 ENCOUNTER — Ambulatory Visit: Payer: BLUE CROSS/BLUE SHIELD | Admitting: Nurse Practitioner

## 2018-03-24 ENCOUNTER — Ambulatory Visit: Payer: BLUE CROSS/BLUE SHIELD | Admitting: Podiatry

## 2018-03-24 ENCOUNTER — Ambulatory Visit (INDEPENDENT_AMBULATORY_CARE_PROVIDER_SITE_OTHER): Payer: BLUE CROSS/BLUE SHIELD

## 2018-03-24 ENCOUNTER — Encounter: Payer: Self-pay | Admitting: Podiatry

## 2018-03-24 VITALS — BP 105/58 | HR 71 | Resp 16

## 2018-03-24 DIAGNOSIS — M722 Plantar fascial fibromatosis: Secondary | ICD-10-CM

## 2018-03-24 DIAGNOSIS — M76821 Posterior tibial tendinitis, right leg: Secondary | ICD-10-CM | POA: Diagnosis not present

## 2018-03-24 NOTE — Patient Instructions (Signed)
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
## 2018-04-23 ENCOUNTER — Encounter: Payer: Self-pay | Admitting: Podiatry

## 2018-04-23 ENCOUNTER — Other Ambulatory Visit: Payer: Self-pay

## 2018-04-23 ENCOUNTER — Ambulatory Visit: Payer: BLUE CROSS/BLUE SHIELD | Admitting: Podiatry

## 2018-04-23 DIAGNOSIS — M722 Plantar fascial fibromatosis: Secondary | ICD-10-CM

## 2018-04-23 DIAGNOSIS — M76821 Posterior tibial tendinitis, right leg: Secondary | ICD-10-CM | POA: Diagnosis not present

## 2018-04-23 NOTE — Progress Notes (Signed)
Presents today for follow-up of her plantar fasciitis and posterior tibial tendinitis of her right foot.  She states that is hurting she says I am got a do something is just not getting any better.  Objective: Vital signs are stable she is alert and oriented x3.  Pulses are palpable.  She has swelling and postinflammatory hyperpigmentation along the entire length of the posterior tibial tendon from the medial malleolus extending to the navicular tuberosity.  She Denlinger has pain on palpation of the plantar fascia.  Assessment: Plantar fasciitis resolving however the posterior tibial tendinitis is worsening.  Severe pes planus with posterior tibial tendinitis and posterior tibial tendon dysfunction.  Plan: Discussed etiology pathology and surgical therapies at this point in time went ahead and set her up for an MRI of the right ankle and rear foot and also for a cam walker.  I will follow-up with her once the MRI comes back.

## 2018-04-24 ENCOUNTER — Encounter: Payer: Self-pay | Admitting: Podiatry

## 2018-04-24 ENCOUNTER — Telehealth: Payer: Self-pay | Admitting: *Deleted

## 2018-04-24 DIAGNOSIS — M76821 Posterior tibial tendinitis, right leg: Secondary | ICD-10-CM

## 2018-04-24 DIAGNOSIS — M722 Plantar fascial fibromatosis: Secondary | ICD-10-CM

## 2018-04-24 DIAGNOSIS — M79676 Pain in unspecified toe(s): Secondary | ICD-10-CM

## 2018-04-24 DIAGNOSIS — T148XXA Other injury of unspecified body region, initial encounter: Secondary | ICD-10-CM

## 2018-04-24 NOTE — Telephone Encounter (Signed)
[facility] directed pre-cert services to [phone].

## 2018-04-24 NOTE — Telephone Encounter (Signed)
-----   Message from Rip Harbour, [facility] sent at 04/23/2018  4:58 PM EDT ----- Regarding: MRI MRI right ankle - evaluate posterior tibial tendon tear right - surgical consideration

## 2018-04-24 NOTE — Telephone Encounter (Signed)
I was unable to get Pre-cert department on the phone listed and had to give to D. Meadows to perform pre-cert on line.

## 2018-04-24 NOTE — Telephone Encounter (Signed)
[place] states the pre-cert service phone for this [phone] and was transferred electronically.

## 2018-04-27 ENCOUNTER — Telehealth: Payer: Self-pay | Admitting: *Deleted

## 2018-04-27 NOTE — Telephone Encounter (Signed)
I am calling to let you know I tried to get authorization from your insurance for the MRI.  I was informed by Mercie Eon that your insurance does not cover MRIs done in an outpatient facility.  "Well I am not going to be [place] to afford getting it done. What do I do now."  I'll see what Dr. Milinda Pointer recommends and I'll give you a call back.

## 2018-04-28 NOTE — Telephone Encounter (Signed)
Was the insurance company referring to a free standing MRI center or any MRI ordered by a non hospital practice.  I would think that they might cover an MRI done in the hospital.

## 2018-05-07 ENCOUNTER — Other Ambulatory Visit: Payer: BLUE CROSS/BLUE SHIELD

## 2018-05-14 ENCOUNTER — Ambulatory Visit: Payer: Self-pay

## 2018-05-14 NOTE — Telephone Encounter (Signed)
Pt called concerned because she works at [employer]. 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  She states that she has no fever, a mild productive cough, runny nose, headache. Nasal discharge is white. 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 Pt was advised to follow all work protocols for handling samples. She was advised if her symptoms worsen to please call back. Cover cough wash hands often. Avoid touching face and eyes.  Pt verbalized understanding of all instructions. Home care advice for cough read to patient. Pt verbalized understanding of all instructions.  Reason for Disposition . Cough with cold symptoms (e.g., runny nose, postnasal drip, throat clearing)  Answer Assessment - Initial Assessment Questions 1. ONSET: "When did the cough begin?"      3weeks ago 2. SEVERITY: "How bad is the cough today?"      No severe 3. RESPIRATORY DISTRESS: "Describe your breathing."      no 4. FEVER: "Do you have a fever?" If so, ask: "What is your temperature, how was it measured, and when did it start?"     no 5. SPUTUM: "Describe the color of your sputum" (clear, white, yellow, green)    white 6. HEMOPTYSIS: "Are you coughing up any blood?" If so ask: "How much?" (flecks, streaks, tablespoons, etc.)     No 7. CARDIAC HISTORY: "Do you have any history of heart disease?" (e.g., heart attack, congestive heart failure)      Plaque in aorta 8. LUNG HISTORY: "Do you have any history of lung disease?"  (e.g., pulmonary embolus, asthma, emphysema)     bronchitis 9. PE RISK FACTORS: "Do you have a history of blood clots?" (or: recent major surgery, recent prolonged travel, bedridden)     no 10. OTHER SYMPTOMS: "Do you have any other symptoms?" (e.g., runny nose, wheezing, chest pain)       Runny nose 11. PREGNANCY: "Is there any chance you are pregnant?" "When was  your last menstrual period?"       N/A 12. TRAVEL: "Have you traveled out of the country in the last month?" (e.g., travel history, exposures)       No  Protocols used: [place]

## 2018-05-14 NOTE — Telephone Encounter (Signed)
See duplicate NT encounter.

## 2018-05-14 NOTE — Telephone Encounter (Signed)
See previous triage encounter from 05/14/18

## 2018-05-14 NOTE — Telephone Encounter (Signed)
Patient called, rang once and went to message saying the caller is not available, try your call again later.

## 2018-05-15 NOTE — Telephone Encounter (Signed)
[place] FYI-- 

## 2018-05-20 ENCOUNTER — Telehealth: Payer: Self-pay | Admitting: Nurse Practitioner

## 2018-05-20 ENCOUNTER — Telehealth: Payer: Self-pay

## 2018-05-20 NOTE — Telephone Encounter (Signed)
I called and left message on patient voicemail per [facility] request, patient has not been seen 02/23/2018, patient needs a follow up appointment for weight loss/HTN.

## 2018-05-20 NOTE — Telephone Encounter (Signed)
Scheduled pt appointment for 4/13 @ 8am. Will send pt mychart message with information about how to set up virtually pt requested.

## 2018-05-25 ENCOUNTER — Telehealth: Payer: Self-pay | Admitting: *Deleted

## 2018-05-25 ENCOUNTER — Ambulatory Visit (INDEPENDENT_AMBULATORY_CARE_PROVIDER_SITE_OTHER): Payer: BLUE CROSS/BLUE SHIELD | Admitting: Nurse Practitioner

## 2018-05-25 ENCOUNTER — Encounter: Payer: Self-pay | Admitting: Nurse Practitioner

## 2018-05-25 DIAGNOSIS — E782 Mixed hyperlipidemia: Secondary | ICD-10-CM

## 2018-05-25 DIAGNOSIS — I10 Essential (primary) hypertension: Secondary | ICD-10-CM

## 2018-05-25 DIAGNOSIS — E119 Type 2 diabetes mellitus without complications: Secondary | ICD-10-CM | POA: Diagnosis not present

## 2018-05-25 DIAGNOSIS — Z6841 Body Mass Index (BMI) 40.0 and over, adult: Secondary | ICD-10-CM

## 2018-05-25 MED ORDER — GLIPIZIDE ER 5 MG PO TB24: 5.0000 mg | ORAL_TABLET | Freq: Every day | ORAL | 1 refills | Status: DC

## 2018-05-25 MED ORDER — METFORMIN HCL 500 MG PO TABS: 500.0000 mg | ORAL_TABLET | Freq: Every day | ORAL | 1 refills | Status: DC

## 2018-05-25 MED ORDER — ATORVASTATIN CALCIUM 20 MG PO TABS: 20.0000 mg | ORAL_TABLET | Freq: Every day | ORAL | 1 refills | Status: DC

## 2018-05-25 MED ORDER — ATENOLOL 50 MG PO TABS: 50.0000 mg | ORAL_TABLET | Freq: Every day | ORAL | 1 refills | Status: DC

## 2018-05-25 MED ORDER — HYDROCHLOROTHIAZIDE 12.5 MG PO TABS: 12.5000 mg | ORAL_TABLET | Freq: Every day | ORAL | 1 refills | Status: DC

## 2018-05-25 NOTE — Telephone Encounter (Signed)
[place] states pt's insurance does not require prior authorization, but cost to pt would be determined after the MRI was performed, insurance will determine if it meets the criteria, and at that point would determine if she had met her deductible then what percentage she owed.

## 2018-05-25 NOTE — Assessment & Plan Note (Signed)
She discontinue contrave dur to fear to possible side effects. She did not return call from bariatric clinic. She does not seem to have the motivation to implement any lifestyle changes as previously discussed (portion control, low fat and low carb, regular exercise like walking).  Advise to schedule with Bariatric clinic once Quarantin period is over. Entered referral to nutritionist.

## 2018-05-25 NOTE — Telephone Encounter (Signed)
Pt called states someone from the MRI center was to call to let her know where she was to have MRI. I reviewed D. Meadows conversation of 04/27/2018 and the question was would the insurance cover the hospital. I told pt I would message an [employer] and call with more information.

## 2018-05-25 NOTE — Assessment & Plan Note (Addendum)
Controlled with glipizide and metformin. Diagnosed with glaucoma Last hgbA1c 6.5 Does not check glucose at home. Denies any adverse effects. Up to date with eye exam, foot exam and urine microalbumin.  F/up in 44month for repeat HgbA1c Advised to check glucose every morning (fasting), send reading via mychart in 1-2weeks

## 2018-05-25 NOTE — Assessment & Plan Note (Signed)
Stable with HCTZ and atenolol. Patient discontinue amlodipine. Resume amlodipine if BP starts to increase.  F/up in 5months. Advised to check BP 2-3times a week. Send BP readings via mychart in 1-2weeks

## 2018-05-25 NOTE — Patient Instructions (Signed)
F/up in 23months (fasting) Contact nutritionist and bariatric clinic once social distancing discontinued; to discuss weight loss. Check glucose once a day in AM. Check BP every other day in AM. Send glucose and BP readings through mychart.

## 2018-05-25 NOTE — Assessment & Plan Note (Signed)
Continue atorvastatin. Repeat lipid panel in 86months

## 2018-05-25 NOTE — Progress Notes (Signed)
Virtual Visit via Video Note  I connected with Ruth [name] on 05/25/18 at  8:00 AM EDT by a video enabled telemedicine application and verified that I am speaking with the correct person using two identifiers.   I discussed the limitations of evaluation and management by telemedicine and the availability of in person appointments. The patient expressed understanding and agreed to proceed.  [id number] up pn BP med and wieht loss/med refills/ no vital sign to share today  History of Present Illness: Morbid Obesity: BMI of 58 She discontinued contrave 2weeks due to concerns about possible side effects. No weight loss noted. Has not made any lifestyle changes. Wt Readings from Last 3 Encounters:  05/25/18 (!) 338 lb (153.3 kg)  02/23/18 (!) 334 lb (151.5 kg)  02/03/18 (!) 334 lb 3.2 oz (151.6 kg)    HTN: Current use of HCTZ and atenolol. Reports she has not taken amlodipine in several months. Does not check BP at home.  DM: Last hgbA1c 6.5 Does not check glucose at home. Current use of glipizide and metformin. Denies any adverse effects. Up to date with eye exam, foot exam and urine microalbumin.   Observations/Objective: Unable to provide any vital signs Alert and oriented x4, normal respiratory effort, no distress noted, normal neck ROM and supple movement, normal EOM.  Assessment and Plan: Ruth [name] was seen today for follow-up.  Diagnoses and all orders for this visit:  Type 2 diabetes mellitus without complication, without long-term current use of insulin (HCC) -     metFORMIN (GLUCOPHAGE) 500 MG tablet; Take 1 tablet (500 mg total) by mouth daily with breakfast. -     glipiZIDE (GLUCOTROL XL) 5 MG 24 hr tablet; Take 1 tablet (5 mg total) by mouth daily with breakfast.  HTN (hypertension), benign -     hydrochlorothiazide (HYDRODIURIL) 12.5 MG tablet; Take 1 tablet (12.5 mg total) by mouth daily. -     atenolol (TENORMIN) 50 MG tablet; Take 1 tablet (50 mg total) by mouth  daily.  Mixed hyperlipidemia -     atorvastatin (LIPITOR) 20 MG tablet; Take 1 tablet (20 mg total) by mouth daily at 6 PM.   Follow Up Instructions: F/up in 15months (fasting) Contact nutritionist and bariatric clinic once social distancing is over. Check glucose once a day in AM Check BP every other day in AM.   I discussed the assessment and treatment plan with the patient. The patient was provided an opportunity to ask questions and all were answered. The patient agreed with the plan and demonstrated an understanding of the instructions.   The patient was advised to call back or seek an in-person evaluation if the symptoms worsen or if the condition fails to improve as anticipated.  [name], NP

## 2018-05-27 ENCOUNTER — Telehealth: Payer: Self-pay | Admitting: Nurse Practitioner

## 2018-05-27 NOTE — Telephone Encounter (Signed)
Right dose in glipizide XL 5mg  once a day. The dose she was taking was prescribed by presvious pcp. I instructed her that new prescription will be sent

## 2018-05-27 NOTE — Telephone Encounter (Signed)
[facility] call to verified that Glucotrol XL 5 mg tab once daily is correct because pt was on 10 mg tab 1 tab twice daily in the past. Please advise.   Phone # [phone]

## 2018-05-27 NOTE — Telephone Encounter (Signed)
Spoke with [employer] at [employer]. He is aware of this message below.

## 2018-06-18 ENCOUNTER — Telehealth: Payer: Self-pay | Admitting: Podiatry

## 2018-06-18 NOTE — Telephone Encounter (Signed)
Pt called checking status of insurance authorization for MRI that is supposed to be scheduled for Monday. Requested a call back and stated you can leave a message if she does not answer.

## 2018-06-18 NOTE — Telephone Encounter (Signed)
Left message informing pt of [facility] stated MRI did not need prior authorization, but cost to pt would be determined after the MRI, and cost to pt would be determined by [facility] insurance covers the MRI and how much of the deductible was met. I told pt if she would call [facility] and ask to discuss with the insurance department.

## 2018-06-22 ENCOUNTER — Other Ambulatory Visit: Payer: BLUE CROSS/BLUE SHIELD

## 2018-08-10 ENCOUNTER — Telehealth: Payer: Self-pay | Admitting: *Deleted

## 2018-08-10 ENCOUNTER — Ambulatory Visit: Payer: BC Managed Care – PPO | Admitting: Podiatry

## 2018-08-10 ENCOUNTER — Encounter: Payer: Self-pay | Admitting: Podiatry

## 2018-08-10 ENCOUNTER — Telehealth: Payer: Self-pay

## 2018-08-10 ENCOUNTER — Other Ambulatory Visit: Payer: Self-pay

## 2018-08-10 DIAGNOSIS — M7751 Other enthesopathy of right foot: Secondary | ICD-10-CM

## 2018-08-10 DIAGNOSIS — M10471 Other secondary gout, right ankle and foot: Secondary | ICD-10-CM | POA: Diagnosis not present

## 2018-08-10 MED ORDER — COLCHICINE 0.6 MG PO TABS: 0.6000 mg | ORAL_TABLET | Freq: Every day | ORAL | 0 refills | Status: DC

## 2018-08-10 NOTE — Telephone Encounter (Signed)
Called pt-let her know the [facility] imaging central scheduling line number. She can call and schedule at her convenience.

## 2018-08-10 NOTE — Telephone Encounter (Signed)
-----   Message from Edrick Kins, DPM sent at 08/10/2018  3:47 PM EDT ----- Regarding: MRI RT ankle Patient was supposed to get an MRI of the RT ankle. She never got it scheduled. Could we f/u with getting her the MRI and have her f/u with Dr. Milinda Pointer. Thanks, Dr. Amalia Hailey

## 2018-08-11 NOTE — Telephone Encounter (Signed)
Patient states Dr. Ellard Artis gave her a note to be out of work until Monday, also wearing a darco shoe and can't work in that. I informed her to fax paperwork to [facility]

## 2018-08-12 NOTE — Progress Notes (Signed)
    HPI: 58 year old female presenting today for follow up evaluation of plantar fasciitis and posterior tibial tendinitis of the right foot. She reports continued pain throughout the entire foot. She also reports pain to the medial ankle. She has been using Lidocaine cream for pain. Walking increases the pain. She states she was never called to schedule an MRI. Patient is here for further evaluation and treatment.  Past Medical History:  Diagnosis Date  . Anemia   . Arthritis   . Diabetes mellitus without complication ([place])    type 2  . Hyperlipidemia   . Hypertension   . Obesity        Physical Exam: General: The patient is alert and oriented x3 in no acute distress.  Dermatology: Skin is warm, dry and supple bilateral lower extremities. Negative for open lesions or macerations.  Vascular: Palpable pedal pulses bilaterally. Capillary refill within normal limits.  Neurological: Epicritic and protective threshold grossly intact bilaterally.   Musculoskeletal Exam: Pain on palpation noted to the posterior tibial tendon of the right foot as well as the 1st MPJ of the right foot. Erythema and edema noted to the 1st MPJ of the right foot. Range of motion within normal limits. Muscle strength 5/5 in all muscle groups bilateral lower extremities.  Assessment: 1. Posterior tibial tendinitis right 2. Gout / 1st MPJ capsulitis right    Plan of Care:  1. Patient was evaluated.  2. Injection of 0.5 mL Celestone Soluspan injected into the 1st MPJ of the right foot.  3. Prescription for Colcrys 0.6 mg provided to patient.  4. Post op shoe dispensed.  5. MRI of the right ankle pending.  6. Note for work provided. No work for one week.  7. Return to clinic in 4 weeks.    Edrick Kins, DPM [facility]  Dr. Edrick Kins, [address]                                        [place], [place] [address]                Office [phone]  Fax [phone]

## 2018-08-21 ENCOUNTER — Other Ambulatory Visit: Payer: Self-pay

## 2018-08-21 ENCOUNTER — Encounter: Payer: Self-pay | Admitting: Nurse Practitioner

## 2018-08-21 ENCOUNTER — Ambulatory Visit: Payer: BC Managed Care – PPO | Admitting: Nurse Practitioner

## 2018-08-21 VITALS — BP 138/70 | HR 77 | Temp 98.8°F | Ht 64.0 in | Wt 343.6 lb

## 2018-08-21 DIAGNOSIS — E782 Mixed hyperlipidemia: Secondary | ICD-10-CM | POA: Diagnosis not present

## 2018-08-21 DIAGNOSIS — E790 Hyperuricemia without signs of inflammatory arthritis and tophaceous disease: Secondary | ICD-10-CM

## 2018-08-21 DIAGNOSIS — L819 Disorder of pigmentation, unspecified: Secondary | ICD-10-CM

## 2018-08-21 DIAGNOSIS — L816 Other disorders of diminished melanin formation: Secondary | ICD-10-CM

## 2018-08-21 DIAGNOSIS — I10 Essential (primary) hypertension: Secondary | ICD-10-CM | POA: Diagnosis not present

## 2018-08-21 DIAGNOSIS — E1165 Type 2 diabetes mellitus with hyperglycemia: Secondary | ICD-10-CM | POA: Diagnosis not present

## 2018-08-21 DIAGNOSIS — Z6841 Body Mass Index (BMI) 40.0 and over, adult: Secondary | ICD-10-CM

## 2018-08-21 MED ORDER — KETOCONAZOLE 2 % EX CREA: 1.0000 "application " | TOPICAL_CREAM | Freq: Two times a day (BID) | CUTANEOUS | 0 refills | Status: DC
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
## 2018-08-21 NOTE — Patient Instructions (Addendum)
Increase in hgbA1c: 6.2 to 8.3. stop glipizide. Maintain metformin. Adding saxenda injection Normal urine microalbuminuria Elevated uric acid. Need to discontinue HCTZ and added amlodipine for HTN. Abnormal lipid panel, increase atorvastatin to 40mg   Contact weight loss clinic for appt.  Let me know if you decide to use saxenda to weight loss and DM control  Liraglutide injection (Weight Management) What is this medicine? LIRAGLUTIDE (LIR a GLOO tide) is used to help people lose weight and maintain weight loss. It is used with a reduced-calorie diet and exercise. This medicine may be used for other purposes; ask your health care provider or pharmacist if you have questions. COMMON BRAND NAME(S): Saxenda What should I tell my health care provider before I take this medicine? They need to know if you have any of these conditions:  endocrine tumors (MEN 2) or if someone in your family had these tumors  gallbladder disease  high cholesterol  history of alcohol abuse problem  history of pancreatitis  kidney disease or if you are on dialysis  liver disease  previous swelling of the tongue, face, or lips with difficulty breathing, difficulty swallowing, hoarseness, or tightening of the throat  stomach problems  suicidal thoughts, plans, or attempt; a previous suicide attempt by you or a family member  thyroid cancer or if someone in your family had thyroid cancer  an unusual or allergic reaction to liraglutide, other medicines, foods, dyes, or preservatives  pregnant or trying to get pregnant  breast-feeding How should I use this medicine? This medicine is for injection under the skin of your upper leg, stomach area, or upper arm. You will be taught how to prepare and give this medicine. Use exactly as directed. Take your medicine at regular intervals. Do not take it more often than directed. It is important that you put your used needles and syringes in a special sharps  container. Do not put them in a trash can. If you do not have a sharps container, call your pharmacist or healthcare provider to get one. A special MedGuide will be given to you by the pharmacist with each prescription and refill. Be sure to read this information carefully each time. Talk to your pediatrician regarding the use of this medicine in children. Special care may be needed. Overdosage: If you think you have taken too much of this medicine contact a poison control center or emergency room at once. NOTE: This medicine is only for you. Do not share this medicine with others. What if I miss a dose? If you miss a dose, take it as soon as you can. If it is almost time for your next dose, take only that dose. Do not take double or extra doses. If you miss your dose for 3 days or more, call your doctor or health care professional to talk about how to restart this medicine. What may interact with this medicine?  insulin and other medicines for diabetes This list may not describe all possible interactions. Give your health care provider a list of all the medicines, herbs, non-prescription drugs, or dietary supplements you use. Also tell them if you smoke, drink alcohol, or use illegal drugs. Some items may interact with your medicine. What should I watch for while using this medicine? Visit your doctor or health care professional for regular checks on your progress. This medicine is intended to be used in addition to a healthy diet and appropriate exercise. The best results are achieved this way. Do not increase or in any  way change your dose without consulting your doctor or health care professional. Drink plenty of fluids while taking this medicine. Check with your doctor or health care professional if you get an attack of severe diarrhea, nausea, and vomiting. The loss of too much body fluid can make it dangerous for you to take this medicine. This medicine may affect blood sugar levels. If you have  diabetes, check with your doctor or health care professional before you change your diet or the dose of your diabetic medicine. Patients and their families should watch out for worsening depression or thoughts of suicide. Also watch out for sudden changes in feelings such as feeling anxious, agitated, panicky, irritable, hostile, aggressive, impulsive, severely restless, overly excited and hyperactive, or not being [place] to sleep. If this happens, especially at the beginning of treatment or after a change in dose, call your health care professional. What side effects may I notice from receiving this medicine? Side effects that you should report to your doctor or health care professional as soon as possible:  allergic reactions like skin rash, itching or hives, swelling of the face, lips, or tongue  breathing problems  diarrhea that continues or is severe  lump or swelling on the neck  severe nausea  signs and symptoms of infection like fever or chills; cough; sore throat; pain or trouble passing urine  signs and symptoms of low blood sugar such as feeling anxious, confusion, dizziness, increased hunger, unusually weak or tired, sweating, shakiness, cold, irritable, headache, blurred vision, fast heartbeat, loss of consciousness  signs and symptoms of kidney injury like trouble passing urine or change in the amount of urine  trouble swallowing  unusual stomach upset or pain  vomiting Side effects that usually do not require medical attention (report to your doctor or health care professional if they continue or are bothersome):  constipation  decreased appetite  diarrhea  fatigue  headache  nausea  pain, redness, or irritation at site where injected  stomach upset  stuffy or runny nose This list may not describe all possible side effects. Call your doctor for medical advice about side effects. You may report side effects to FDA at 1-800-FDA-1088. Where should I keep my  medicine? Keep out of the reach of children. Store unopened pen in a refrigerator between 2 and 8 degrees C (36 and 46 degrees F). Do not freeze or use if the medicine has been frozen. Protect from light and excessive heat. After you first use the pen, it can be stored at room temperature between 15 and 30 degrees C (59 and 86 degrees F) or in a refrigerator. Throw away your used pen after 30 days or after the expiration date, whichever comes first. Do not store your pen with the needle attached. If the needle is left on, medicine may leak from the pen. NOTE: This sheet is a summary. It may not cover all possible information. If you have questions about this medicine, talk to your doctor, pharmacist, or health care provider.  2020 Elsevier/Gold Standard (2018-03-06 17:10:35)

## 2018-08-22 LAB — LIPID PANEL
Cholesterol: 210 mg/dL — ABNORMAL HIGH (ref <200)
HDL: 52 mg/dL (ref >=50)
LDL Cholesterol (Calc): 138 mg/dL (calc) — ABNORMAL HIGH
Non-HDL Cholesterol (Calc): 158 mg/dL (calc) — ABNORMAL HIGH (ref <130)
Total CHOL/HDL Ratio: 4 (calc) (ref <5.0)
Triglycerides: 95 mg/dL (ref <150)

## 2018-08-22 LAB — MICROALBUMIN / CREATININE URINE RATIO
Creatinine, Urine: 101 mg/dL (ref 20–275)
Microalb Creat Ratio: 4 mcg/mg creat (ref <30)
Microalb, Ur: 0.4 mg/dL

## 2018-08-22 LAB — HEPATIC FUNCTION PANEL
AG Ratio: 1.4 (calc) (ref 1.0–2.5)
ALT: 24 U/L (ref 6–29)
AST: 20 U/L (ref 10–35)
Albumin: 3.8 g/dL (ref 3.6–5.1)
Alkaline phosphatase (APISO): 61 U/L (ref 37–153)
Bilirubin, Direct: 0.1 mg/dL (ref 0.0–0.2)
Globulin: 2.7 g/dL (calc) (ref 1.9–3.7)
Indirect Bilirubin: 0.3 mg/dL (calc) (ref 0.2–1.2)
Total Bilirubin: 0.4 mg/dL (ref 0.2–1.2)
Total Protein: 6.5 g/dL (ref 6.1–8.1)

## 2018-08-22 LAB — BASIC METABOLIC PANEL
BUN: 14 mg/dL (ref 7–25)
CO2: 30 mmol/L (ref 20–32)
Calcium: 9.8 mg/dL (ref 8.6–10.4)
Chloride: 105 mmol/L (ref 98–110)
Creat: 0.84 mg/dL (ref 0.50–1.05)
Glucose, Bld: 172 mg/dL — ABNORMAL HIGH (ref 65–99)
Potassium: 4.3 mmol/L (ref 3.5–5.3)
Sodium: 143 mmol/L (ref 135–146)

## 2018-08-22 LAB — HEMOGLOBIN A1C
Hgb A1c MFr Bld: 8.3 % of total Hgb — ABNORMAL HIGH (ref <5.7)
Mean Plasma Glucose: 192 (calc)
eAG (mmol/L): 10.6 (calc)

## 2018-08-22 LAB — URIC ACID: Uric Acid, Serum: 8.1 mg/dL — ABNORMAL HIGH (ref 2.5–7.0)

## 2018-08-24 ENCOUNTER — Other Ambulatory Visit: Payer: Self-pay | Admitting: Nurse Practitioner

## 2018-08-24 DIAGNOSIS — E1165 Type 2 diabetes mellitus with hyperglycemia: Secondary | ICD-10-CM

## 2018-08-24 MED ORDER — LIRAGLUTIDE 18 MG/3ML ~~LOC~~ SOPN: PEN_INJECTOR | SUBCUTANEOUS | 0 refills | Status: DC

## 2018-08-24 MED ORDER — SAXENDA 18 MG/3ML ~~LOC~~ SOPN: PEN_INJECTOR | SUBCUTANEOUS | 1 refills | Status: DC

## 2018-08-24 MED ORDER — AMLODIPINE BESYLATE 5 MG PO TABS: 5.0000 mg | ORAL_TABLET | Freq: Every day | ORAL | 1 refills | Status: DC

## 2018-08-24 MED ORDER — METFORMIN HCL 500 MG PO TABS: 500.0000 mg | ORAL_TABLET | Freq: Every day | ORAL | 1 refills | Status: DC

## 2018-08-24 MED ORDER — ATORVASTATIN CALCIUM 40 MG PO TABS: 40.0000 mg | ORAL_TABLET | Freq: Every day | ORAL | 1 refills | Status: DC

## 2018-08-24 MED ORDER — ATENOLOL 50 MG PO TABS: 50.0000 mg | ORAL_TABLET | Freq: Every day | ORAL | 1 refills | Status: DC

## 2018-08-24 NOTE — Telephone Encounter (Signed)
PA started for Saxenda, waiting for result  Ruth [name] (Key: [id number])

## 2018-08-24 NOTE — Telephone Encounter (Signed)
Changed to victoza

## 2018-08-24 NOTE — Telephone Encounter (Signed)
PA denied due to not loss enough weight (5% baseline after 16 weights of Contrave therapy) and pt can not use Contrave therapy.   Please advise.

## 2018-08-25 NOTE — Telephone Encounter (Signed)
LVM for the pt to call back.

## 2018-08-25 NOTE — Telephone Encounter (Signed)
PN-I spoke to pt/informed that ins did not approve medication and it was changed to Victoza/went over instructions with her twice and advised that it will be on there as well when she picks it up/thx dmf

## 2018-08-27 MED ORDER — CLICKFINE PEN NEEDLES 32G X 4 MM MISC: 3 refills | Status: DC

## 2018-08-27 NOTE — Addendum Note (Signed)
Addended by: Janan Ridge on: 08/27/2018 03:18 PM   Modules accepted: Orders

## 2018-08-27 NOTE — Telephone Encounter (Signed)
Pt need needles for Victoiza please advise,

## 2018-08-28 ENCOUNTER — Telehealth: Payer: Self-pay | Admitting: Nurse Practitioner

## 2018-08-28 NOTE — Telephone Encounter (Signed)
Pt needs a call back about how to use the pen needle victoza.  She also has a question about when to take the medicine and thinks it may work better for her to take it in the morning.  Please call [phone]

## 2018-08-28 NOTE — Telephone Encounter (Signed)
Pt returned call to [place].

## 2018-08-28 NOTE — Telephone Encounter (Signed)
LVM for the pt to call back.

## 2018-08-31 ENCOUNTER — Ambulatory Visit: Payer: BLUE CROSS/BLUE SHIELD | Admitting: Nurse Practitioner

## 2018-08-31 NOTE — Telephone Encounter (Signed)
LVM for the pt to call back.

## 2018-09-07 ENCOUNTER — Ambulatory Visit (INDEPENDENT_AMBULATORY_CARE_PROVIDER_SITE_OTHER): Payer: BC Managed Care – PPO | Admitting: Bariatrics

## 2018-09-07 ENCOUNTER — Encounter (INDEPENDENT_AMBULATORY_CARE_PROVIDER_SITE_OTHER): Payer: Self-pay | Admitting: Bariatrics

## 2018-09-07 ENCOUNTER — Other Ambulatory Visit: Payer: Self-pay

## 2018-09-07 VITALS — BP 154/89 | HR 88 | Temp 98.3°F | Ht 64.0 in | Wt 336.0 lb

## 2018-09-07 DIAGNOSIS — R5383 Other fatigue: Secondary | ICD-10-CM

## 2018-09-07 DIAGNOSIS — E119 Type 2 diabetes mellitus without complications: Secondary | ICD-10-CM

## 2018-09-07 DIAGNOSIS — Z9189 Other specified personal risk factors, not elsewhere classified: Secondary | ICD-10-CM

## 2018-09-07 DIAGNOSIS — E7849 Other hyperlipidemia: Secondary | ICD-10-CM

## 2018-09-07 DIAGNOSIS — E559 Vitamin D deficiency, unspecified: Secondary | ICD-10-CM

## 2018-09-07 DIAGNOSIS — R0602 Shortness of breath: Secondary | ICD-10-CM

## 2018-09-07 DIAGNOSIS — F3289 Other specified depressive episodes: Secondary | ICD-10-CM

## 2018-09-07 DIAGNOSIS — E538 Deficiency of other specified B group vitamins: Secondary | ICD-10-CM

## 2018-09-07 DIAGNOSIS — Z0289 Encounter for other administrative examinations: Secondary | ICD-10-CM

## 2018-09-07 DIAGNOSIS — Z6841 Body Mass Index (BMI) 40.0 and over, adult: Secondary | ICD-10-CM

## 2018-09-07 NOTE — Progress Notes (Signed)
.  Office: [phone]  /  Fax: [phone]   Letter sent to her PCP who referred her to our facility.   HPI:   Chief Complaint: OBESITY  Ruth [name] (MR# [id number]) is a 58 y.o. female who presents on 09/07/2018 for obesity evaluation and treatment. Current BMI is Body mass index is 57.67 kg/m.Marland Kitchen Ruth [name] has struggled with obesity for years and has been unsuccessful in either losing weight or maintaining long term weight loss. Ruth [name] attended our information session and states she is currently in the action stage of change and ready to dedicate time achieving and maintaining a healthier weight.   Ruth [name] sometimes likes to [employer]. She craves sweets at times. She states that she is somewhat of a picky eater and really likes "rice."  Ruth [name] states her family eats meals together she thinks her family will eat healthier with  her her desired weight loss is 191 lbs she started gaining weight in her late 15's her heaviest weight ever was 343 lbs. she is a picky eater and doesn't like to eat healthier foods  she has significant food cravings issues  she frequently eats larger portions than normal  she struggles with emotional eating    Fatigue [name] feels her energy is lower than it should be. This has worsened with weight gain and has worsened recently. Ruth [name] admits to daytime somnolence and  admits to waking up still tired. Patient is at risk for obstructive sleep apnea. Patent has a history of symptoms of daytime fatigue. Patient generally gets 5 hours of sleep per night, and states they generally do not have restful sleep. Snoring is sometimes present. Apneic episodes are not present. Epworth Sleepiness Score is 7.  Dyspnea on exertion Ruth [name] notes increasing shortness of breath with certain activities and seems to be worsening over time with weight gain. She notes getting out of breath sooner with activity than she used to. This has gotten worse recently. [name] denies  orthopnea.  Hypertension Ruth [name] is a 58 y.o. female with hypertension and is on Norvasc and atenolol. Ruth [name] denies chest pain or shortness of breath on exertion. She is working weight loss to help control her blood pressure with the goal of decreasing her risk of heart attack and stroke. Ruth [name]'s blood pressure is not well controlled today.  At risk for cardiovascular disease Channel is at a higher than average risk for cardiovascular disease due to obesity. She currently denies any chest pain.  Diabetes II Ruth [name] has a diagnosis of diabetes type II and is taking Victoza (started last week) and metformin. Ruth [name] does not report checking her blood sugars but states her diabetes is well controlled. Last A1c was 8.3 on 08/21/2018. She has been working on intensive lifestyle modifications including diet, exercise, and weight loss to help control her blood glucose levels.  Hyperlipidemia Ruth [name] has hyperlipidemia and is taking atorvastatin. She has been trying to improve her cholesterol levels with intensive lifestyle modification including a low saturated fat diet, exercise and weight loss. She denies any chest pain, claudication or myalgias.  Vitamin D deficiency Ruth [name] has a diagnosis of Vitamin D deficiency. She is currently not taking Vit D and denies nausea, vomiting or muscle weakness.  Vitamin B12 Deficiency Ruth [name] has a diagnosis of B12 insufficiency and is not taking B12 supplement.  Depression with emotional eating behaviors Ruth [name] is struggling with emotional eating and using food for comfort to the extent that it is negatively impacting her health. She often snacks when  she is not hungry. Ruth [name] sometimes feels she is out of control and then feels guilty that she made poor food choices. She has been working on behavior modification techniques to help reduce her emotional eating and has been somewhat successful. She shows no sign of suicidal or homicidal  ideations.  Depression Screen Ruth [name]'s Food and Mood (modified PHQ-9) score was 20. Depression screen [name] 2/9 09/07/2018  Decreased Interest 3  Down, Depressed, Hopeless 2  PHQ - 2 Score 5  Altered sleeping 3  Tired, decreased energy 3  Change in appetite 3  Feeling bad or failure about yourself  3  Trouble concentrating 0  Moving slowly or fidgety/restless 3  Suicidal thoughts 0  PHQ-9 Score 20  Difficult doing work/chores Somewhat difficult   ASSESSMENT AND PLAN:  Other fatigue - Plan: EKG 12-Lead, Vitamin B12, T3, T4, free, TSH  Shortness of breath on exertion  Other hyperlipidemia  Vitamin D deficiency - Plan: VITAMIN D 25 Hydroxy (Vit-D Deficiency, Fractures)  B12 nutritional deficiency  Other depression - with emotional eating  At risk for heart disease  Type 2 diabetes mellitus without complication, without long-term current use of insulin (HCC)  Class 3 severe obesity due to excess calories with serious comorbidity and body mass index (BMI) of 50.0 to 59.9 in adult [facility])  PLAN:  Fatigue [name] was informed that her fatigue may be related to obesity, depression or many other causes. Labs will be ordered, and in the meanwhile [name] has agreed to work on diet, exercise and weight loss to help with fatigue. Proper sleep hygiene was discussed including the need for 7-8 hours of quality sleep each night. A sleep study was not ordered based on symptoms and Epworth score.  Dyspnea on exertion [name]'s shortness of breath appears to be obesity related and exercise induced. She has agreed to work on weight loss and gradually increase exercise to treat her exercise induced shortness of breath. If [name] follows our instructions and loses weight without improvement of her shortness of breath, we will plan to refer to pulmonology. We will monitor this condition regularly. [name] agrees to this plan.  Hypertension We discussed sodium restriction, working on healthy weight  loss, and a regular exercise program as the means to achieve improved blood pressure control. [name] agreed with this plan and agreed to follow up as directed. We will continue to monitor her blood pressure as well as her progress with the above lifestyle modifications. She will continue her medications as prescribed and will watch for signs of hypotension as she continues her lifestyle modifications.  Cardiovascular risk counseling Bryannah was given extended (15 minutes) coronary artery disease prevention counseling today. She is 58 y.o. female and has risk factors for heart disease including obesity. We discussed intensive lifestyle modifications today with an emphasis on specific weight loss instructions and strategies. Pt was also informed of the importance of increasing exercise and decreasing saturated fats to help prevent heart disease.  Diabetes II Hadasa has been given extensive diabetes education by myself today including ideal fasting and post-prandial blood glucose readings, individual ideal HgA1c goals  and hypoglycemia prevention. We discussed the importance of good blood sugar control to decrease the likelihood of diabetic complications such as nephropathy, neuropathy, limb loss, blindness, coronary artery disease, and death. We discussed the importance of intensive lifestyle modification including diet, exercise and weight loss as the first line treatment for diabetes. Daylee agrees to continue her diabetes medications and will follow-up at the agreed upon time.  Hyperlipidemia Gerri was informed of the American Heart Association Guidelines emphasizing intensive lifestyle modifications as the first line treatment for hyperlipidemia. We discussed many lifestyle modifications today in depth, and Jaycee will continue to work on decreasing saturated fats such as fatty red meat, butter and many fried foods. She will continue her medication, increase vegetables and lean protein in her diet, and  continue to work on exercise and weight loss efforts.  Vitamin D Deficiency Eloise was informed that low Vitamin D levels contributes to fatigue and are associated with obesity, breast, and colon cancer. She will have routine testing of Vitamin D today. Lyndia agrees to follow-up with our clinic in 2 weeks.  Vitamin B12 Deficiency Sravya will work on increasing B12 rich foods in her diet. B12 supplementation was not prescribed today. We will plan on checking labs today.   Depression with Emotional Eating Behaviors We discussed behavior modification techniques today to help Keanu deal with her emotional eating and depression. Druanne will be referred to Dr. Mallie Mussel, our bariatric psychologist.  Depression Screen Malavika had a strongly positive depression screening. Depression is commonly associated with obesity and often results in emotional eating behaviors. We will monitor this closely and work on CBT to help improve the non-hunger eating patterns.   Obesity Gentri is currently in the action stage of change and her goal is to continue with weight loss efforts. She has agreed to follow the Category 3 plan. Makell will work on meal planning, intentional eating, and decreasing her carbohydrates. Guillermina has been instructed to work up to a goal of 150 minutes of combined cardio and strengthening exercise per week for weight loss and overall health benefits. We discussed the following Behavioral Modification Strategies today: increasing lean protein intake, decreasing simple carbohydrates, increasing vegetables, increase H20 intake, decrease eating out, no skipping meals, work on meal planning and easy cooking plans, and keeping healthy foods in the home.  Elzora has agreed to follow-up with our clinic in 2 weeks. She was informed of the importance of frequent follow-up visits to maximize her success with intensive lifestyle modifications for her multiple health conditions. She was informed we would  discuss her lab results at her next visit unless there is a critical issue that needs to be addressed sooner. Vincenzina agreed to keep her next visit at the agreed upon time to discuss these results.  ALLERGIES: Allergies  Allergen Reactions   Lisinopril Swelling   Tape Other (See Comments)    Certain Band aids cause skin irritation    MEDICATIONS: Current Outpatient Medications on File Prior to Visit  Medication Sig Dispense Refill   amLODipine (NORVASC) 5 MG tablet Take 1 tablet (5 mg total) by mouth at bedtime. 90 tablet 1   atenolol (TENORMIN) 50 MG tablet Take 1 tablet (50 mg total) by mouth daily. 90 tablet 1   atorvastatin (LIPITOR) 40 MG tablet Take 1 tablet (40 mg total) by mouth daily at 6 PM. 90 tablet 1   blood glucose meter kit and supplies KIT Dispense based on patient and insurance preference. Use up to four times daily as directed. (FOR ICD-9 250.00, 250.01). 1 each 0   Insulin Pen Needle (CLICKFINE PEN NEEDLES) 32G X 4 MM MISC To use with Victoza 100 each 3   ketoconazole (NIZORAL) 2 % cream Apply 1 application topically 2 (two) times daily. 15 g 0   latanoprost (XALATAN) 0.005 % ophthalmic solution Place 1 drop into both eyes at bedtime.   11   liraglutide (VICTOZA)  18 MG/3ML SOPN Inject 0.1 mLs (0.6 mg total) into the skin daily at 12 noon for 7 days, THEN 0.2 mLs (1.2 mg total) daily at 12 noon for 7 days, THEN 0.3 mLs (1.8 mg total) daily at 12 noon for 7 days, THEN 0.4 mLs (2.4 mg total) daily at 12 noon for 7 days. 7 mL 0   metFORMIN (GLUCOPHAGE) 500 MG tablet Take 1 tablet (500 mg total) by mouth daily with breakfast. 90 tablet 1   Multiple Vitamins-Minerals (MULTIVITAMIN WITH MINERALS) tablet Take 1 tablet by mouth daily.     traMADol (ULTRAM) 50 MG tablet Take by mouth every 6 (six) hours as needed.     colchicine 0.6 MG tablet Take 1 tablet (0.6 mg total) by mouth daily. 2 tablets stat, then 1 by mouth daily. (Patient not taking: Reported on 09/07/2018) 20  tablet 0   No current facility-administered medications on file prior to visit.     PAST MEDICAL HISTORY: Past Medical History:  Diagnosis Date   Anemia    Arthritis    Back pain    Bilateral calcaneal spurs    Diabetes mellitus without complication (HCC)    type 2   GERD (gastroesophageal reflux disease)    Gout    Hyperlipidemia    Hypertension    Obesity    Osteoarthritis    Palpitations    Swallowing difficulty    Vitamin D deficiency     PAST SURGICAL HISTORY: Past Surgical History:  Procedure Laterality Date   ABDOMINAL HYSTERECTOMY     partial   CHOLECYSTECTOMY     COLONOSCOPY WITH PROPOFOL N/A 10/29/2016   Procedure: COLONOSCOPY WITH PROPOFOL;  Surgeon: Yetta Flock, MD;  Location: [facility];  Service: Gastroenterology;  Laterality: N/A;   DENTAL SURGERY     ORIF TIBIA & FIBULA FRACTURES Left     SOCIAL HISTORY: Social History   Tobacco Use   Smoking status: Former Smoker    Packs/day: 1.00    Years: 20.00    Pack years: 20.00    Quit date: 02/12/2008    Years since quitting: 10.5   Smokeless tobacco: Never Used  Substance Use Topics   Alcohol use: No   Drug use: No    FAMILY HISTORY: Family History  Problem Relation Age of Onset   Diabetes Mother    Stroke Mother    Dementia Mother    Hypertension Mother    Eating disorder Mother    Obesity Mother    Pancreatic cancer Father    Sudden death Father    Alcoholism Father    Colon polyps Sister    Kidney disease Sister    Cancer Maternal Grandmother        type unknown   ROS: Review of Systems  Constitutional: Negative for malaise/fatigue.  HENT: Positive for nosebleeds.        Positive for ringing in the ears. Positive for earache. Positive for nose stuffiness. Positive for difficult or painful swallowing. Positive for dry mouth. Positive for swollen glands.  Eyes:       Positive for wearing glasses or contacts.  Respiratory: Positive  for shortness of breath (with activity) and wheezing.   Cardiovascular: Positive for chest pain and palpitations.  Gastrointestinal: Positive for diarrhea and heartburn. Negative for nausea and vomiting.       Positive for swallowing difficulty.  Musculoskeletal: Positive for back pain and joint pain.       Positive for muscle stiffness. Negative for muscle weakness.  Skin: Positive for itching.  Neurological:       Positive for leg cramping.  Endo/Heme/Allergies: Bruises/bleeds easily.       Positive for heat/cold intolerance.  Psychiatric/Behavioral: Positive for depression (emotional eating). Negative for suicidal ideas.       Positive for stress. Negative for homicidal ideas.   PHYSICAL EXAM: Blood pressure (!) 154/89, pulse 88, temperature 98.3 F (36.8 C), temperature source Oral, height 5' 4"  (1.626 m), weight (!) 336 lb (152.4 kg), SpO2 98 %. Body mass index is 57.67 kg/m. Physical Exam Vitals signs reviewed.  Constitutional:      Appearance: Normal appearance. She is well-developed. She is obese.  HENT:     Head: Normocephalic and atraumatic.     Nose: Nose normal.  Eyes:     General: No scleral icterus. Neck:     Musculoskeletal: Normal range of motion.  Cardiovascular:     Rate and Rhythm: Normal rate and regular rhythm.  Pulmonary:     Effort: Pulmonary effort is normal. No respiratory distress.  Abdominal:     Palpations: Abdomen is soft.     Tenderness: There is no abdominal tenderness.  Musculoskeletal: Normal range of motion.     Comments: Range of motion normal in all four extremities.  Skin:    General: Skin is warm and dry.  Neurological:     Mental Status: She is alert and oriented to person, place, and time.     Coordination: Coordination normal.  Psychiatric:        Mood and Affect: Mood and affect normal.        Behavior: Behavior normal.   RECENT LABS AND TESTS: BMET    Component Value Date/Time   NA 143 08/21/2018 1401   NA 140  08/22/2017 0931   K 4.3 08/21/2018 1401   CL 105 08/21/2018 1401   CO2 30 08/21/2018 1401   GLUCOSE 172 (H) 08/21/2018 1401   BUN 14 08/21/2018 1401   BUN 21 08/22/2017 0931   CREATININE 0.84 08/21/2018 1401   CALCIUM 9.8 08/21/2018 1401   GFRNONAA >60 10/30/2017 0712   GFRAA >60 10/30/2017 0712   Lab Results  Component Value Date   HGBA1C 8.3 (H) 08/21/2018   No results found for: INSULIN CBC    Component Value Date/Time   WBC 7.4 10/30/2017 0712   RBC 4.27 10/30/2017 0712   HGB 12.7 10/30/2017 0712   HGB 12.6 08/22/2017 0931   HCT 39.4 10/30/2017 0712   HCT 39.5 08/22/2017 0931   PLT 253 10/30/2017 0712   PLT 275 08/22/2017 0931   MCV 92.3 10/30/2017 0712   MCV 91 08/22/2017 0931   MCH 29.7 10/30/2017 0712   MCHC 32.2 10/30/2017 0712   RDW 14.2 10/30/2017 0712   RDW 14.5 08/22/2017 0931   Iron/TIBC/Ferritin/ %Sat No results found for: IRON, TIBC, FERRITIN, IRONPCTSAT Lipid Panel     Component Value Date/Time   CHOL 210 (H) 08/21/2018 1401   CHOL 186 08/22/2017 0931   TRIG 95 08/21/2018 1401   HDL 52 08/21/2018 1401   HDL 62 08/22/2017 0931   CHOLHDL 4.0 08/21/2018 1401   VLDL 22.0 08/02/2016 1659   LDLCALC 138 (H) 08/21/2018 1401   Hepatic Function Panel     Component Value Date/Time   PROT 6.5 08/21/2018 1401   PROT 7.1 08/22/2017 0931   ALBUMIN 4.1 08/22/2017 0931   AST 20 08/21/2018 1401   ALT 24 08/21/2018 1401   ALKPHOS 84 08/22/2017 0931   BILITOT  0.4 08/21/2018 1401   BILITOT 0.3 08/22/2017 0931   BILIDIR 0.1 08/21/2018 1401   IBILI 0.3 08/21/2018 1401      Component Value Date/Time   TSH 1.730 08/22/2017 0931   No results found for: Vitamin D  ECG shows sinus rhythm with a rate of 89 BPM, poor R wave progression most likely related to body habitus, otherwise normal.  INDIRECT CALORIMETER done today shows a VO2 of 283 and a REE of 1967. Her calculated basal metabolic rate is 1021 thus her basal metabolic rate is worse than  expected.  OBESITY BEHAVIORAL INTERVENTION VISIT  Today's visit was #1  Starting weight: 336 lbs Starting date: 09/07/2018 Today's weight: 336 lbs  Today's date: 09/07/2018 Total lbs lost to date: 0   09/07/2018  Height 5' 4"  (1.626 m)  Weight 336 lb (152.4 kg) (A)  BMI (Calculated) 57.65  BLOOD PRESSURE - SYSTOLIC 117  BLOOD PRESSURE - DIASTOLIC 89  Waist Measurement  58 inches   Body Fat % 56.6 %  Total Body Water (lbs) 100 lbs  RMR 1967   ASK: We discussed the diagnosis of obesity with [place] today and [place]on agreed to give [place] permission to discuss obesity behavioral modification therapy today.  ASSESS: Junita has the diagnosis of obesity and her BMI today is 57.7. Ireene is in the action stage of change.   ADVISE: Yuki was educated on the multiple health risks of obesity as well as the benefit of weight loss to improve her health. She was advised of the need for long term treatment and the importance of lifestyle modifications to improve her current health and to decrease her risk of future health problems.  AGREE: Multiple dietary modification options and treatment options were discussed and  Weslie agreed to follow the recommendations documented in the above note.  ARRANGE: Adilynn was educated on the importance of frequent visits to treat obesity as outlined per CMS and USPSTF guidelines and agreed to schedule her next follow up appointment today.  Migdalia Dk, am acting as Location manager for [employer], DO  I have reviewed the above documentation for accuracy and completeness, and I agree with the above. -Jearld Lesch, DO

## 2018-09-08 ENCOUNTER — Encounter (INDEPENDENT_AMBULATORY_CARE_PROVIDER_SITE_OTHER): Payer: Self-pay | Admitting: Bariatrics

## 2018-09-08 ENCOUNTER — Ambulatory Visit: Payer: BC Managed Care – PPO | Admitting: Podiatry

## 2018-09-08 ENCOUNTER — Encounter: Payer: Self-pay | Admitting: Podiatry

## 2018-09-08 ENCOUNTER — Ambulatory Visit (INDEPENDENT_AMBULATORY_CARE_PROVIDER_SITE_OTHER): Payer: BC Managed Care – PPO

## 2018-09-08 ENCOUNTER — Ambulatory Visit
Admission: RE | Admit: 2018-09-08 | Discharge: 2018-09-08 | Disposition: A | Discharge status: Home / Self Care | Payer: BC Managed Care – PPO | Source: Ambulatory Visit | Attending: Podiatry | Admitting: Podiatry

## 2018-09-08 VITALS — Temp 97.7°F

## 2018-09-08 DIAGNOSIS — M779 Enthesopathy, unspecified: Secondary | ICD-10-CM | POA: Diagnosis not present

## 2018-09-08 DIAGNOSIS — T148XXA Other injury of unspecified body region, initial encounter: Secondary | ICD-10-CM

## 2018-09-08 DIAGNOSIS — M76821 Posterior tibial tendinitis, right leg: Secondary | ICD-10-CM | POA: Diagnosis not present

## 2018-09-08 DIAGNOSIS — M722 Plantar fascial fibromatosis: Secondary | ICD-10-CM

## 2018-09-08 DIAGNOSIS — M778 Other enthesopathies, not elsewhere classified: Secondary | ICD-10-CM

## 2018-09-08 DIAGNOSIS — M19071 Primary osteoarthritis, right ankle and foot: Secondary | ICD-10-CM | POA: Diagnosis not present

## 2018-09-08 DIAGNOSIS — M65871 Other synovitis and tenosynovitis, right ankle and foot: Secondary | ICD-10-CM | POA: Diagnosis not present

## 2018-09-08 DIAGNOSIS — M67873 Other specified disorders of tendon, right ankle and foot: Secondary | ICD-10-CM | POA: Diagnosis not present

## 2018-09-08 LAB — T3: T3, Total: 111 ng/dL (ref 71–180)

## 2018-09-08 LAB — VITAMIN B12: Vitamin B-12: 1228 pg/mL (ref 232–1245)

## 2018-09-08 LAB — TSH: TSH: 1.76 u[IU]/mL (ref 0.450–4.500)

## 2018-09-08 LAB — VITAMIN D 25 HYDROXY (VIT D DEFICIENCY, FRACTURES): Vit D, 25-Hydroxy: 24.9 ng/mL — ABNORMAL LOW (ref 30.0–100.0)

## 2018-09-08 LAB — T4, FREE: Free T4: 1.03 ng/dL (ref 0.82–1.77)

## 2018-09-08 MED ORDER — METHYLPREDNISOLONE 4 MG PO TBPK: ORAL_TABLET | ORAL | 0 refills | Status: DC

## 2018-09-08 NOTE — Progress Notes (Signed)
She presents today for chief complaint of pain to the medial aspect of her right foot.  States that the big toe joint is doing better is a lot less painful than it was having radiating pain right in here she points to the medial foot.  She is scheduled for an MRI this afternoon.  Objective: Signs are stable she is alert and oriented x3.  Pulses are palpable.  No reproducible pain on palpation of the first metatarsal phalangeal joint is not red swollen and hot and the gout has subsided.  At this point she has tenderness on palpation of the posterior tibial tendon with exquisite pain on the navicular tuberosity.  Assessment: Posterior tibial tendon dysfunction and posterior tibial tendon tear most likely.  Resolving gout.  Plan: At this point I provided her with a 6-day low-dose Medrol Dosepak 4 mg.  I also will follow-up with her after her MRI is done.
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
## 2018-09-10 ENCOUNTER — Telehealth: Payer: Self-pay | Admitting: *Deleted

## 2018-09-10 NOTE — Telephone Encounter (Signed)
Left message informing pt of Dr. Stephenie Acres request to send a copy of the MRI disc to a radiology specialist for more details for treatment planning, there would be a 10-14 day delay for final results and we would contact her once they were received.

## 2018-09-10 NOTE — Telephone Encounter (Signed)
-----   Message from Garrel Ridgel, [place] sent at 09/09/2018  5:52 PM EDT ----- Please send for an over read and inform patient of the delay.  She does appear to have a tear in her tendon and we are confirming.

## 2018-09-11 ENCOUNTER — Encounter: Payer: Self-pay | Admitting: Nurse Practitioner

## 2018-09-11 NOTE — Telephone Encounter (Signed)
Mailed copy of MRI disc received 09/10/2018 to [facility].

## 2018-09-15 ENCOUNTER — Other Ambulatory Visit: Payer: Self-pay

## 2018-09-15 ENCOUNTER — Ambulatory Visit (INDEPENDENT_AMBULATORY_CARE_PROVIDER_SITE_OTHER): Payer: BC Managed Care – PPO | Admitting: Psychology

## 2018-09-15 DIAGNOSIS — F3289 Other specified depressive episodes: Secondary | ICD-10-CM | POA: Diagnosis not present

## 2018-09-19 ENCOUNTER — Encounter: Payer: Self-pay | Admitting: Nurse Practitioner

## 2018-09-21 ENCOUNTER — Ambulatory Visit (INDEPENDENT_AMBULATORY_CARE_PROVIDER_SITE_OTHER): Payer: BC Managed Care – PPO | Admitting: Bariatrics

## 2018-09-21 ENCOUNTER — Other Ambulatory Visit: Payer: Self-pay | Admitting: Nurse Practitioner

## 2018-09-21 ENCOUNTER — Other Ambulatory Visit (INDEPENDENT_AMBULATORY_CARE_PROVIDER_SITE_OTHER): Payer: Self-pay | Admitting: Bariatrics

## 2018-09-21 ENCOUNTER — Encounter (INDEPENDENT_AMBULATORY_CARE_PROVIDER_SITE_OTHER): Payer: Self-pay | Admitting: Bariatrics

## 2018-09-21 ENCOUNTER — Other Ambulatory Visit: Payer: Self-pay

## 2018-09-21 VITALS — BP 127/81 | HR 70 | Temp 97.8°F | Ht 64.0 in | Wt 334.0 lb

## 2018-09-21 DIAGNOSIS — Z9189 Other specified personal risk factors, not elsewhere classified: Secondary | ICD-10-CM

## 2018-09-21 DIAGNOSIS — E119 Type 2 diabetes mellitus without complications: Secondary | ICD-10-CM | POA: Diagnosis not present

## 2018-09-21 DIAGNOSIS — E559 Vitamin D deficiency, unspecified: Secondary | ICD-10-CM | POA: Diagnosis not present

## 2018-09-21 DIAGNOSIS — E1165 Type 2 diabetes mellitus with hyperglycemia: Secondary | ICD-10-CM

## 2018-09-21 DIAGNOSIS — Z6841 Body Mass Index (BMI) 40.0 and over, adult: Secondary | ICD-10-CM

## 2018-09-21 MED ORDER — VITAMIN D (ERGOCALCIFEROL) 1.25 MG (50000 UNIT) PO CAPS: 50000.0000 [IU] | ORAL_CAPSULE | ORAL | 0 refills | Status: DC

## 2018-09-22 NOTE — Progress Notes (Signed)
Office: [phone]  /  Fax: [phone]   HPI:   Chief Complaint: OBESITY Ruth [name] is here to discuss her progress with her obesity treatment plan. She is on the Category 3 plan and is following her eating plan approximately 85 % of the time. She states she is exercising 0 minutes 0 times per week. Ruth [name] is down two pounds. She states that she is sick of eggs. Ruth [name] states that it was not bad. She denies any stress or emotional cravings. Her weight is (!) 334 lb (151.5 kg) today and has had a weight loss of 2 pounds over a period of 2 weeks since her last visit. She has lost 2 lbs since starting treatment with [place].  Vitamin D deficiency Ruth [name] has a diagnosis of vitamin D deficiency. Her last vitamin D level was at 24.9 She is not currently taking vit D and she denies nausea, vomiting or muscle weakness.  At risk for osteopenia and osteoporosis Ruth [name] is at higher risk of osteopenia and osteoporosis due to vitamin D deficiency.   Diabetes II Ruth [name] has a diagnosis of diabetes type II. She is taking Victoza and Metformin. Ruth [name] denies any hypoglycemic episodes. Last A1c was at 8.3 She has been working on intensive lifestyle modifications including diet, exercise, and weight loss to help control her blood glucose levels.  ASSESSMENT AND PLAN:  Vitamin D deficiency - Plan: Vitamin D, Ergocalciferol, (DRISDOL) 1.25 MG (50000 UT) CAPS capsule  Type 2 diabetes mellitus without complication, without long-term current use of insulin (HCC)  At risk for osteoporosis  Class 3 severe obesity due to excess calories with serious comorbidity and body mass index (BMI) of 50.0 to 59.9 in adult [facility])  PLAN:  Vitamin D Deficiency Ruth [name] was informed that low vitamin D levels contributes to fatigue and are associated with obesity, breast, and colon cancer. She agrees to start prescription Vit D _0 ,000 IU every week #4 with no refills and will follow up for routine testing of vitamin D, at least  2-3 times per year. She was informed of the risk of over-replacement of vitamin D and agrees to not increase her dose unless she discusses this with [place] first. Ruth [name] agrees to follow up with our clinic in 2 weeks.  At risk for osteopenia and osteoporosis Cass was given extended  (15 minutes) osteoporosis prevention counseling today. Ruth [name] is at risk for osteopenia and osteoporosis due to her vitamin D deficiency. She was encouraged to take her vitamin D and follow her higher calcium diet and increase strengthening exercise to help strengthen her bones and decrease her risk of osteopenia and osteoporosis.  Diabetes II [name] has been given extensive diabetes education by myself today including ideal fasting and post-prandial blood glucose readings, individual ideal Hgb A1c goals and hypoglycemia prevention. We discussed the importance of good blood sugar control to decrease the likelihood of diabetic complications such as nephropathy, neuropathy, limb loss, blindness, coronary artery disease, and death. We discussed the importance of intensive lifestyle modification including diet, exercise and weight loss as the first line treatment for diabetes. Ruth [name] will start using her machine (will go by the pharmacy to check on machine). She will continue her diabetes medications and will follow up at the agreed upon time.  Obesity Ruth [name] is currently in the action stage of change. As such, her goal is to continue with weight loss efforts She has agreed to follow the Category 3 plan Ruth [name] has been instructed to work up to a goal of 150 minutes  of combined cardio and strengthening exercise per week for weight loss and overall health benefits. We discussed the following Behavioral Modification Strategies today: increase H2O intake, no skipping meals, keeping healthy foods in the home, increasing lean protein intake, decreasing simple carbohydrates, increasing vegetables, decrease eating out and work on meal  planning and intentional eating Additional Category 3 breakfast options were given to patient today. Ruth [name] will use her air fryer.  Ruth [name] has agreed to follow up with our clinic in 2 weeks. She was informed of the importance of frequent follow up visits to maximize her success with intensive lifestyle modifications for her multiple health conditions.  ALLERGIES: Allergies  Allergen Reactions  . Lisinopril Swelling  . Tape Other (See Comments)    Certain Band aids cause skin irritation    MEDICATIONS: Current Outpatient Medications on File Prior to Visit  Medication Sig Dispense Refill  . amLODipine (NORVASC) 5 MG tablet Take 1 tablet (5 mg total) by mouth at bedtime. 90 tablet 1  . atenolol (TENORMIN) 50 MG tablet Take 1 tablet (50 mg total) by mouth daily. 90 tablet 1  . atorvastatin (LIPITOR) 40 MG tablet Take 1 tablet (40 mg total) by mouth daily at 6 PM. 90 tablet 1  . blood glucose meter kit and supplies KIT Dispense based on patient and insurance preference. Use up to four times daily as directed. (FOR ICD-9 250.00, 250.01). 1 each 0  . colchicine 0.6 MG tablet Take 1 tablet (0.6 mg total) by mouth daily. 2 tablets stat, then 1 by mouth daily. 20 tablet 0  . Insulin Pen Needle (CLICKFINE PEN NEEDLES) 32G X 4 MM MISC To use with Victoza 100 each 3  . ketoconazole (NIZORAL) 2 % cream Apply 1 application topically 2 (two) times daily. 15 g 0  . latanoprost (XALATAN) 0.005 % ophthalmic solution Place 1 drop into both eyes at bedtime.   11  . metFORMIN (GLUCOPHAGE) 500 MG tablet Take 1 tablet (500 mg total) by mouth daily with breakfast. 90 tablet 1  . methylPREDNISolone (MEDROL DOSEPAK) 4 MG TBPK tablet 6 day dose pack - take as directed 21 tablet 0  . Multiple Vitamins-Minerals (MULTIVITAMIN WITH MINERALS) tablet Take 1 tablet by mouth daily.    . traMADol (ULTRAM) 50 MG tablet Take by mouth every 6 (six) hours as needed.     No current facility-administered medications on file  prior to visit.     PAST MEDICAL HISTORY: Past Medical History:  Diagnosis Date  . Anemia   . Arthritis   . Back pain   . Bilateral calcaneal spurs   . Diabetes mellitus without complication ([place])    type 2  . GERD (gastroesophageal reflux disease)   . Gout   . Hyperlipidemia   . Hypertension   . Obesity   . Osteoarthritis   . Palpitations   . Swallowing difficulty   . Vitamin D deficiency     PAST SURGICAL HISTORY: Past Surgical History:  Procedure Laterality Date  . ABDOMINAL HYSTERECTOMY     partial  . CHOLECYSTECTOMY    . COLONOSCOPY WITH PROPOFOL N/A 10/29/2016   Procedure: COLONOSCOPY WITH PROPOFOL;  Surgeon: Yetta Flock, MD;  Location: [facility];  Service: Gastroenterology;  Laterality: N/A;  . DENTAL SURGERY    . ORIF TIBIA & FIBULA FRACTURES Left     SOCIAL HISTORY: Social History   Tobacco Use  . Smoking status: Former Smoker    Packs/day: 1.00    Years: 20.00  Pack years: 20.00    Quit date: 02/12/2008    Years since quitting: 10.6  . Smokeless tobacco: Never Used  Substance Use Topics  . Alcohol use: No  . Drug use: No    FAMILY HISTORY: Family History  Problem Relation Age of Onset  . Diabetes Mother   . Stroke Mother   . Dementia Mother   . Hypertension Mother   . Eating disorder Mother   . Obesity Mother   . Pancreatic cancer Father   . Sudden death Father   . Alcoholism Father   . Colon polyps Sister   . Kidney disease Sister   . Cancer Maternal Grandmother        type unknown    ROS: Review of Systems  Constitutional: Positive for weight loss.  Gastrointestinal: Negative for nausea and vomiting.  Musculoskeletal:       Negative for muscle weakness  Endo/Heme/Allergies:       Negative for hypoglycemia    PHYSICAL EXAM: Blood pressure 127/81, pulse 70, temperature 97.8 F (36.6 C), temperature source Oral, height _0  (1.626 m), weight (!) 334 lb (151.5 kg), SpO2 98 %. Body mass index is 57.33 kg/m.  Physical Exam Vitals signs reviewed.  Constitutional:      Appearance: Normal appearance. She is well-developed. She is obese.  Cardiovascular:     Rate and Rhythm: Normal rate.  Pulmonary:     Effort: Pulmonary effort is normal.  Musculoskeletal: Normal range of motion.  Skin:    General: Skin is warm and dry.  Neurological:     Mental Status: She is alert and oriented to person, place, and time.  Psychiatric:        Mood and Affect: Mood normal.        Behavior: Behavior normal.     RECENT LABS AND TESTS: BMET    Component Value Date/Time   NA 143 08/21/2018 1401   NA 140 08/22/2017 0931   K 4.3 08/21/2018 1401   CL 105 08/21/2018 1401   CO2 30 08/21/2018 1401   GLUCOSE 172 (H) 08/21/2018 1401   BUN 14 08/21/2018 1401   BUN 21 08/22/2017 0931   CREATININE 0.84 08/21/2018 1401   CALCIUM 9.8 08/21/2018 1401   GFRNONAA >60 10/30/2017 0712   GFRAA >60 10/30/2017 0712   Lab Results  Component Value Date   HGBA1C 8.3 (H) 08/21/2018   HGBA1C 6.2 (A) 02/03/2018   HGBA1C 6.5 (H) 08/22/2017   HGBA1C 7.6 (H) 08/02/2016   HGBA1C 7.6 07/20/2015   No results found for: INSULIN CBC    Component Value Date/Time   WBC 7.4 10/30/2017 0712   RBC 4.27 10/30/2017 0712   HGB 12.7 10/30/2017 0712   HGB 12.6 08/22/2017 0931   HCT 39.4 10/30/2017 0712   HCT 39.5 08/22/2017 0931   PLT 253 10/30/2017 0712   PLT 275 08/22/2017 0931   MCV 92.3 10/30/2017 0712   MCV 91 08/22/2017 0931   MCH 29.7 10/30/2017 0712   MCHC 32.2 10/30/2017 0712   RDW 14.2 10/30/2017 0712   RDW 14.5 08/22/2017 0931   Iron/TIBC/Ferritin/ %Sat No results found for: IRON, TIBC, FERRITIN, IRONPCTSAT Lipid Panel     Component Value Date/Time   CHOL 210 (H) 08/21/2018 1401   CHOL 186 08/22/2017 0931   TRIG 95 08/21/2018 1401   HDL 52 08/21/2018 1401   HDL 62 08/22/2017 0931   CHOLHDL 4.0 08/21/2018 1401   VLDL 22.0 08/02/2016 1659   LDLCALC 138 (H) 08/21/2018  1401   Hepatic Function Panel      Component Value Date/Time   PROT 6.5 08/21/2018 1401   PROT 7.1 08/22/2017 0931   ALBUMIN 4.1 08/22/2017 0931   AST 20 08/21/2018 1401   ALT 24 08/21/2018 1401   ALKPHOS 84 08/22/2017 0931   BILITOT 0.4 08/21/2018 1401   BILITOT 0.3 08/22/2017 0931   BILIDIR 0.1 08/21/2018 1401   IBILI 0.3 08/21/2018 1401      Component Value Date/Time   TSH 1.760 09/07/2018 1222   TSH 1.730 08/22/2017 0931   TSH 1.88 08/02/2016 1659     Ref. Range 09/07/2018 12:22  Vitamin D, 25-Hydroxy Latest Ref Range: 30.0 - 100.0 ng/mL 24.9 (L)    OBESITY BEHAVIORAL INTERVENTION VISIT  Today's visit was # 2   Starting weight: 336 lbs Starting date: 09/07/2018 Today's weight : 334 lbs  Today's date: 09/21/2018 Total lbs lost to date: 2    09/21/2018  Height _0  (1.626 m)  Weight 334 lb (151.5 kg) (A)  BMI (Calculated) 57.3  BLOOD PRESSURE - SYSTOLIC 075  BLOOD PRESSURE - DIASTOLIC 81   Body Fat % 73.2 %  Total Body Water (lbs) 103.6 lbs    ASK: We discussed the diagnosis of obesity with [place] today and Dickie agreed to give [place] permission to discuss obesity behavioral modification therapy today.  ASSESS: Lisabeth has the diagnosis of obesity and her BMI today is 5.3 Kalayah is in the action stage of change   ADVISE: Keiarah was educated on the multiple health risks of obesity as well as the benefit of weight loss to improve her health. She was advised of the need for long term treatment and the importance of lifestyle modifications to improve her current health and to decrease her risk of future health problems.  AGREE: Multiple dietary modification options and treatment options were discussed and  Taziah agreed to follow the recommendations documented in the above note.  ARRANGE: Jamielynn was educated on the importance of frequent visits to treat obesity as outlined per CMS and USPSTF guidelines and agreed to schedule her next follow up appointment today.  Corey Skains, am acting  as Location manager for [employer]. Owens Shark, DO   I have reviewed the above documentation for accuracy and completeness, and I agree with the above. -Jearld Lesch, DO

## 2018-09-23 ENCOUNTER — Encounter: Payer: Self-pay | Admitting: Nurse Practitioner

## 2018-09-23 ENCOUNTER — Encounter (INDEPENDENT_AMBULATORY_CARE_PROVIDER_SITE_OTHER): Payer: Self-pay | Admitting: Bariatrics

## 2018-09-24 ENCOUNTER — Encounter: Payer: Self-pay | Admitting: Nurse Practitioner

## 2018-09-25 ENCOUNTER — Ambulatory Visit: Payer: BC Managed Care – PPO | Admitting: Nurse Practitioner

## 2018-09-27 ENCOUNTER — Encounter: Payer: Self-pay | Admitting: Nurse Practitioner

## 2018-09-29 ENCOUNTER — Telehealth: Payer: Self-pay

## 2018-09-29 DIAGNOSIS — Z0279 Encounter for issue of other medical certificate: Secondary | ICD-10-CM

## 2018-09-29 NOTE — Telephone Encounter (Signed)
PN-Pt called while you were away to go over paperwork?I asked them to wait by the phone/thx dmf

## 2018-09-29 NOTE — Telephone Encounter (Signed)
Spoke with the pt, she is aware.   FMLA faxed, pt is aware.

## 2018-09-30 ENCOUNTER — Encounter: Payer: Self-pay | Admitting: Podiatry

## 2018-09-30 ENCOUNTER — Telehealth: Payer: Self-pay | Admitting: Podiatry

## 2018-09-30 NOTE — Telephone Encounter (Signed)
Wanting to know status of MRI over read.

## 2018-10-01 NOTE — Telephone Encounter (Signed)
Left voicemail to schedule appointment to go over her MRI results with Dr. Milinda Pointer.

## 2018-10-02 ENCOUNTER — Ambulatory Visit: Payer: BC Managed Care – PPO | Admitting: Nurse Practitioner

## 2018-10-05 ENCOUNTER — Ambulatory Visit (INDEPENDENT_AMBULATORY_CARE_PROVIDER_SITE_OTHER): Payer: Self-pay | Admitting: Psychology

## 2018-10-06 ENCOUNTER — Encounter: Payer: Self-pay | Admitting: Nurse Practitioner

## 2018-10-06 ENCOUNTER — Other Ambulatory Visit: Payer: Self-pay | Admitting: Nurse Practitioner

## 2018-10-06 DIAGNOSIS — E782 Mixed hyperlipidemia: Secondary | ICD-10-CM

## 2018-10-06 DIAGNOSIS — E1165 Type 2 diabetes mellitus with hyperglycemia: Secondary | ICD-10-CM

## 2018-10-06 DIAGNOSIS — E119 Type 2 diabetes mellitus without complications: Secondary | ICD-10-CM

## 2018-10-08 ENCOUNTER — Ambulatory Visit (INDEPENDENT_AMBULATORY_CARE_PROVIDER_SITE_OTHER): Payer: BC Managed Care – PPO | Admitting: Bariatrics

## 2018-10-15 ENCOUNTER — Encounter: Payer: Self-pay | Admitting: Podiatry

## 2018-10-15 ENCOUNTER — Ambulatory Visit: Payer: BC Managed Care – PPO | Admitting: Podiatry

## 2018-10-15 ENCOUNTER — Other Ambulatory Visit: Payer: Self-pay

## 2018-10-15 DIAGNOSIS — T148XXA Other injury of unspecified body region, initial encounter: Secondary | ICD-10-CM

## 2018-10-15 DIAGNOSIS — M76821 Posterior tibial tendinitis, right leg: Secondary | ICD-10-CM

## 2018-10-15 NOTE — Patient Instructions (Signed)
Pre-Operative Instructions  Congratulations, you have decided to take an important step towards improving your quality of life.  You can be assured that the doctors and staff at [facility] will be with you every step of the way.  Here are some important things you should know:  1. Plan to be at the surgery center/hospital at least 1 (one) hour prior to your scheduled time, unless otherwise directed by the surgical center/hospital staff.  You must have a responsible adult accompany you, remain during the surgery and drive you home.  Make sure you have directions to the surgical center/hospital to ensure you arrive on time. 2. If you are having surgery at [facility] or [facility] hospitals, you will need a copy of your medical history and physical form from your family physician within one month prior to the date of surgery. We will give you a form for your primary physician to complete.  3. We make every effort to accommodate the date you request for surgery.  However, there are times where surgery dates or times have to be moved.  We will contact you as soon as possible if a change in schedule is required.   4. No aspirin/ibuprofen for one week before surgery.  If you are on aspirin, any non-steroidal anti-inflammatory medications (Mobic, Aleve, Ibuprofen) should not be taken seven (7) days prior to your surgery.  You make take Tylenol for pain prior to surgery.  5. Medications - If you are taking daily heart and blood pressure medications, seizure, reflux, allergy, asthma, anxiety, pain or diabetes medications, make sure you notify the surgery center/hospital before the day of surgery so they can tell you which medications you should take or avoid the day of surgery. 6. No food or drink after midnight the night before surgery unless directed otherwise by surgical center/hospital staff. 7. No alcoholic beverages 24-hours prior to surgery.  No smoking 24-hours prior or 24-hours after  surgery. 8. Wear loose pants or shorts. They should be loose enough to fit over bandages, boots, and casts. 9. Don't wear slip-on shoes. Sneakers are preferred. 10. Bring your boot with you to the surgery center/hospital.  Also bring crutches or a walker if your physician has prescribed it for you.  If you do not have this equipment, it will be provided for you after surgery. 11. If you have not been contacted by the surgery center/hospital by the day before your surgery, call to confirm the date and time of your surgery. 12. Leave-time from work may vary depending on the type of surgery you have.  Appropriate arrangements should be made prior to surgery with your employer. 13. Prescriptions will be provided immediately following surgery by your doctor.  Fill these as soon as possible after surgery and take the medication as directed. Pain medications will not be refilled on weekends and must be approved by the doctor. 14. Remove nail polish on the operative foot and avoid getting pedicures prior to surgery. 15. Wash the night before surgery.  The night before surgery wash the foot and leg well with water and the antibacterial soap provided. Be sure to pay special attention to beneath the toenails and in between the toes.  Wash for at least three (3) minutes. Rinse thoroughly with water and dry well with a towel.  Perform this wash unless told not to do so by your physician.  Enclosed: 1 Ice pack (please put in freezer the night before surgery)   1 Hibiclens skin cleaner     Pre-op instructions  If you have any questions regarding the instructions, please do not hesitate to call our office.  [place]: [address], [place], [place] [address] -- [phone]  [place]: [address] [place], [place] [address] -- [phone]   [place]: [address]  , [place] [address] -- [phone]  [place]: [address], [address], [place], [place] [address] -- [phone]  Website:  [email or web address] 

## 2018-10-15 NOTE — Progress Notes (Signed)
She presents today with severe pain to the right lower extremity.  She states that my foot is just really been killing me and we will have to do something about this even if it surgery.  Objective: Vital signs are stable she is alert and oriented x3.  Pulses are palpable.  She has pain on palpation of the posterior tibial tendon and the peroneal tendon and on palpation of the plantar fascia.  MRI does demonstrate posterior tibial tendinitis particularly a tear at the level of the navicular tuberosity with edema in the navicular tuberosity.  The [place] brevis tendon appears to have a split tear in it.  And plantar fascia demonstrates plantar fasciitis.  Assessment: Pain in limb secondary to tendinitis posterior tibial tendon peroneal tendon and plantar fasciitis of the right foot.  Plan: Discussed etiology pathology conservative surgical therapies at this point consented her today for a Kidner with posterior tibial tendon repair repair of the peroneal tendons as well as a EPF and cast application.  We did discuss pros and cons of surgery including the possible postop complications which may include but not limited to postop pain bleeding swelling infection loss of digit loss of limb loss of life.  Provided her with instructions for the day of surgery including information regarding the surgery center and anesthesia group.  She will receive a block from anesthesia.

## 2018-10-21 ENCOUNTER — Encounter: Payer: Self-pay | Admitting: *Deleted

## 2018-10-21 ENCOUNTER — Telehealth: Payer: Self-pay | Admitting: *Deleted

## 2018-10-21 NOTE — Progress Notes (Signed)
Per Dr. Milinda Pointer, I sent a surgical medical clearance request letter to Linda Hedges, NP.  Mrs. Memmer is scheduled for surgery on 10/30/2018.

## 2018-10-21 NOTE — Telephone Encounter (Signed)
I am trying to get medical clearance from Dr. Brigitte Pulse.  Do you know the first name of Dr. Brigitte Pulse?  "I don't see a Dr. Brigitte Pulse.  My primary care doctor's name is Dr. Baldo Ash over at [facility]."  Maybe Dr. Stephenie Acres nurse misinterpreted what Dr. Milinda Pointer was saying.  I apologize for the mistake.  I'll get clearance from Dr. Baldo Ash, I have her number.

## 2018-10-22 ENCOUNTER — Other Ambulatory Visit (INDEPENDENT_AMBULATORY_CARE_PROVIDER_SITE_OTHER): Payer: Self-pay | Admitting: Bariatrics

## 2018-10-22 ENCOUNTER — Ambulatory Visit: Payer: BC Managed Care – PPO | Admitting: Podiatry

## 2018-10-22 ENCOUNTER — Other Ambulatory Visit: Payer: Self-pay | Admitting: Nurse Practitioner

## 2018-10-22 ENCOUNTER — Telehealth: Payer: Self-pay | Admitting: Nurse Practitioner

## 2018-10-22 DIAGNOSIS — E559 Vitamin D deficiency, unspecified: Secondary | ICD-10-CM

## 2018-10-22 DIAGNOSIS — E1165 Type 2 diabetes mellitus with hyperglycemia: Secondary | ICD-10-CM

## 2018-10-22 NOTE — Telephone Encounter (Signed)
Medical clearance letter written for upcoming foot surgery

## 2018-10-23 ENCOUNTER — Telehealth: Payer: Self-pay | Admitting: *Deleted

## 2018-10-23 ENCOUNTER — Telehealth: Payer: Self-pay | Admitting: Nurse Practitioner

## 2018-10-23 NOTE — Telephone Encounter (Signed)
DOS 10/30/2018 ENDOSCOPIC PLANTAR FASCIOTOMY - 29893, REPAIR PERONEAL AND POSTERIOR TIBIAL TENDON - 28086, [facility] ADVANCED TENDON - [id number], AND CAST APPLICATION OF THE RIGHT FOOT  BCBS: Eligibility Date - 02/11/2018 - 02/12/2019   In-Network    Max Per Benefit Period Year-to-Date Remaining  CoInsurance     Deductible  $1,500.00 725.87  Out-Of-Pocket  $6,000.00 [phone]   In-Network Individual  Copay Coinsurance  Not Applicable  [id number]   Place of Service: [place]

## 2018-10-23 NOTE — Telephone Encounter (Signed)

## 2018-10-26 ENCOUNTER — Other Ambulatory Visit: Payer: Self-pay

## 2018-10-26 ENCOUNTER — Ambulatory Visit: Payer: BC Managed Care – PPO | Admitting: Nurse Practitioner

## 2018-10-26 ENCOUNTER — Telehealth: Payer: Self-pay | Admitting: Nurse Practitioner

## 2018-10-26 ENCOUNTER — Encounter: Payer: Self-pay | Admitting: Nurse Practitioner

## 2018-10-26 DIAGNOSIS — Z6841 Body Mass Index (BMI) 40.0 and over, adult: Secondary | ICD-10-CM

## 2018-10-26 DIAGNOSIS — I10 Essential (primary) hypertension: Secondary | ICD-10-CM

## 2018-10-26 DIAGNOSIS — K5903 Drug induced constipation: Secondary | ICD-10-CM | POA: Insufficient documentation

## 2018-10-26 DIAGNOSIS — R748 Abnormal levels of other serum enzymes: Secondary | ICD-10-CM

## 2018-10-26 DIAGNOSIS — E790 Hyperuricemia without signs of inflammatory arthritis and tophaceous disease: Secondary | ICD-10-CM

## 2018-10-26 DIAGNOSIS — E1165 Type 2 diabetes mellitus with hyperglycemia: Secondary | ICD-10-CM | POA: Diagnosis not present

## 2018-10-26 DIAGNOSIS — Z23 Encounter for immunization: Secondary | ICD-10-CM

## 2018-10-26 MED ORDER — SENNOSIDES-DOCUSATE SODIUM 8.6-50 MG PO TABS: 1.0000 | ORAL_TABLET | Freq: Every day | ORAL | 1 refills | Status: DC

## 2018-10-26 NOTE — Patient Instructions (Addendum)
Maintain adequate oral hydration (60oz of water per day) and high fiber diet. Use senna for constipation.  Start glucose check daily in AM Bring glucose reading to next OV. Call office if glucose >200 for more than 2days.  Normal lab results except increase in lipase.  Advice to hold victoza x2days then return to lab for repeat lipase/amylase Continue metformin and resume glipizide if GI symptoms resolve.

## 2018-10-26 NOTE — Progress Notes (Signed)
Subjective:  Patient ID: Ruth [name], female    DOB: [birth year]  Age: 58 y.o. MRN: [id number]  CC: Follow-up ( DM and weight management (repeat hepatic panel, lipase, and uric acid/ feel sick--nausea at times since started sexenda. okey with flu shot,tdap consult?)  HPI  HTN: Controlled with amlodipine, and atenolol BP Readings from Last 3 Encounters:  10/26/18 122/70  09/21/18 127/81  09/07/18 (!) 154/89   DM and morbid Obesity: Current use of metformin and victoza 1.63m Reports nausea and constipation in last 2weeks. No vomiting, no ABD pain. Last HgbA1c of 8.3 Has not checked glucose at home, state she does not know how to use glucometer.  under care of nutritionist, next appt 10/28/2018. Admits to not following diet in last 1week. Negative urine microalbumin, no ACE/ARB at this time LDL not at goal, current use of atorvastatin She is still to make appt with ophthalmology. No weight loss since last OV  Wt Readings from Last 3 Encounters:  10/26/18 (!) 337 lb (152.9 kg)  09/21/18 (!) 334 lb (151.5 kg)  09/07/18 (!) 336 lb (152.4 kg)   Reviewed past Medical, Social and Family history today.  Outpatient Medications Prior to Visit  Medication Sig Dispense Refill   amLODipine (NORVASC) 5 MG tablet Take 1 tablet (5 mg total) by mouth at bedtime. 90 tablet 1   atenolol (TENORMIN) 50 MG tablet Take 1 tablet (50 mg total) by mouth daily. 90 tablet 1   atorvastatin (LIPITOR) 40 MG tablet Take 1 tablet (40 mg total) by mouth daily at 6 PM. 90 tablet 1   Insulin Pen Needle (CLICKFINE PEN NEEDLES) 32G X 4 MM MISC To use with Victoza 100 each 3   ketoconazole (NIZORAL) 2 % cream Apply 1 application topically 2 (two) times daily. 15 g 0   latanoprost (XALATAN) 0.005 % ophthalmic solution Place 1 drop into both eyes at bedtime.   11   liraglutide (VICTOZA) 18 MG/3ML SOPN Inject 0.3 mLs (1.8 mg total) into the skin every morning. 9 mL 0   metFORMIN (GLUCOPHAGE) 500 MG tablet Take  1 tablet (500 mg total) by mouth daily with breakfast. 90 tablet 1   Multiple Vitamins-Minerals (MULTIVITAMIN WITH MINERALS) tablet Take 1 tablet by mouth daily.     traMADol (ULTRAM) 50 MG tablet Take by mouth every 6 (six) hours as needed.     Vitamin D, Ergocalciferol, (DRISDOL) 1.25 MG (50000 UT) CAPS capsule Take 1 capsule (50,000 Units total) by mouth every 7 (seven) days. 4 capsule 0   blood glucose meter kit and supplies KIT Dispense based on patient and insurance preference. Use up to four times daily as directed. (FOR ICD-9 250.00, 250.01). (Patient not taking: Reported on 10/26/2018) 1 each 0   colchicine 0.6 MG tablet Take 1 tablet (0.6 mg total) by mouth daily. 2 tablets stat, then 1 by mouth daily. (Patient not taking: Reported on 10/26/2018) 20 tablet 0   No facility-administered medications prior to visit.     ROS See HPI  Objective:  BP 122/70    Pulse 83    Temp 97.8 F (36.6 C) (Tympanic)    Ht 5' 4"  (1.626 m)    Wt (!) 337 lb (152.9 kg)    SpO2 97%    BMI 57.85 kg/m   BP Readings from Last 3 Encounters:  10/26/18 122/70  09/21/18 127/81  09/07/18 (!) 154/89    Wt Readings from Last 3 Encounters:  10/26/18 (!) 337 lb (152.9 kg)  09/21/18 (Marland Kitchen  334 lb (151.5 kg)  09/07/18 (!) 336 lb (152.4 kg)    Physical Exam Cardiovascular:     Rate and Rhythm: Normal rate and regular rhythm.     Pulses: Normal pulses.     Heart sounds: Normal heart sounds.  Pulmonary:     Effort: Pulmonary effort is normal.     Breath sounds: Normal breath sounds.  Abdominal:     General: Bowel sounds are normal.     Palpations: Abdomen is soft.     Tenderness: There is no abdominal tenderness. There is no guarding.  Musculoskeletal:     Right lower leg: No edema.     Left lower leg: No edema.     Lab Results  Component Value Date   WBC 7.4 10/30/2017   HGB 12.7 10/30/2017   HCT 39.4 10/30/2017   PLT 253 10/30/2017   GLUCOSE 172 (H) 08/21/2018   CHOL 210 (H) 08/21/2018    TRIG 95 08/21/2018   HDL 52 08/21/2018   LDLCALC 138 (H) 08/21/2018   ALT 22 10/26/2018   AST 18 10/26/2018   NA 143 08/21/2018   K 4.3 08/21/2018   CL 105 08/21/2018   CREATININE 0.84 08/21/2018   BUN 14 08/21/2018   CO2 30 08/21/2018   TSH 1.760 09/07/2018   HGBA1C 8.3 (H) 08/21/2018   MICROALBUR 0.4 08/21/2018    Mr Ankle Right Wo Contrast  Result Date: 09/09/2018 CLINICAL DATA:  Lateral ankle pain and swelling EXAM: MRI OF THE RIGHT ANKLE WITHOUT CONTRAST TECHNIQUE: Multiplanar, multisequence MR imaging of the ankle was performed. No intravenous contrast was administered. COMPARISON:  X-ray 03/24/2018 FINDINGS: TENDONS Peroneal: Mild focal thickening of the peroneus longus tendon at the level of the lateral malleolus. Long segment split tear of the peroneus brevis tendon from the myotendinous junction and extending at least to the peroneal tubercle of the calcaneus. Trace volume of tenosynovial fluid. Posteromedial: Intact tibialis posterior, flexor hallucis longus and flexor digitorum longus tendons. Trace fluid within the tibialis posterior tendon sheath. Anterior: Intact tibialis anterior, extensor hallucis longus and extensor digitorum longus tendons. Achilles: Intact. Plantar Fascia: Intact. LIGAMENTS Lateral: Anterior and posterior tibiofibular ligaments are intact. Anterior and posterior talofibular ligaments and the calcaneofibular ligament are intact. Medial: Deltoid ligament intact.  Spring ligament grossly intact. CARTILAGE Ankle Joint: There is thinning of the tibiotalar articular cartilage without focal cartilage defect. There is a shallow notch at the anteromedial margin of the tibial plafond suggestive of a notch of Harty (series 5, image 23), an anatomic variant. Mild anterolateral tibiotalar osteophytosis. No tibiotalar joint effusion. Subtalar Joints/Sinus Tarsi: Mild subtalar joint OA without discrete cartilage defect. Preservation of the fat signal within the sinus tarsi.  Bones: Prominent bidirectional calcaneal enthesophytes. Mild marrow edema within the plantar lateral aspect of the navicular. Prominent dorsal osteophytosis of the calcaneocuboid joint. There is dorsal capsular hypertrophy at the talonavicular joint prominent subchondral cyst. Subchondral cyst versus intraosseous ganglion within the fifth metatarsal base. Moderate arthropathy of the second TMT joint. Scattered mild degenerative changes at the remaining TMT joints. Other: There is soft tissue prominence and subcutaneous edema, most pronounced over the medial aspect of the ankle and midfoot. IMPRESSION: 1. Peroneus longus tendinosis and long segment split tear of the peroneus brevis tendon. Trace fluid within the peroneal tendon sheath, may be reactive or reflect mild tenosynovitis. 2. Mild posterior tibialis tenosynovitis. 3. Degenerative changes of the midfoot and hindfoot, as above. Electronically Signed   By: Davina Poke M.D.   On:  09/09/2018 13:44   Dg Foot Complete Right  Result Date: 09/08/2018 Please see detailed radiograph report in office note.   Assessment & Plan:   Mansi was seen today for follow-up.  Diagnoses and all orders for this visit:  Morbid obesity with BMI of 50.0-59.9, adult ([facility]) -     Hepatic function panel -     Lipase -     Uric acid  HTN (hypertension), benign -     Hepatic function panel -     Lipase -     Uric acid  Type 2 diabetes mellitus with hyperglycemia, without long-term current use of insulin (HCC) -     Cancel: Hepatic function panel -     Cancel: Lipase -     Hepatic function panel -     Lipase -     Uric acid -     Lipase; Future -     Amylase; Future  Hyperuricemia -     Cancel: Uric acid -     Hepatic function panel -     Lipase -     Uric acid  Drug-induced constipation -     senna-docusate (SENOKOT-S) 8.6-50 MG tablet; Take 1 tablet by mouth at bedtime. -     Hepatic function panel -     Lipase -     Uric acid  Need for  influenza vaccination -     Flu Vaccine QUAD High Dose(Fluad)  Need for diphtheria-tetanus-pertussis (Tdap) vaccine -     Tdap vaccine greater than or equal to 7yo IM  Need for pneumococcal vaccination -     Pneumococcal polysaccharide vaccine 23-valent greater than or equal to 2yo subcutaneous/IM  Elevated lipase -     Lipase; Future -     Amylase; Future   I have discontinued Barron Schmid. [place]'s blood glucose meter kit and supplies and colchicine. I am also having her start on senna-docusate. Additionally, I am having her maintain her multivitamin with minerals, latanoprost, ketoconazole, atorvastatin, atenolol, amLODipine, metFORMIN, Clickfine Pen Needles, traMADol, Vitamin D (Ergocalciferol), and Victoza.  Meds ordered this encounter  Medications   senna-docusate (SENOKOT-S) 8.6-50 MG tablet    Sig: Take 1 tablet by mouth at bedtime.    Dispense:  90 tablet    Refill:  1    Order Specific Question:   Supervising Provider    Answer:   Lucille Passy [[id number]]    Problem List Items Addressed This Visit      Cardiovascular and Mediastinum   HTN (hypertension), benign    BP at goal with amlodipine and atenolol. BP Readings from Last 3 Encounters:  10/26/18 122/70  09/21/18 127/81  09/07/18 (!) 154/89        Relevant Orders   Hepatic function panel (Completed)   Lipase (Completed)   Uric acid (Completed)     Digestive   Drug-induced constipation    Due to victoza injection Start senna.      Relevant Medications   senna-docusate (SENOKOT-S) 8.6-50 MG tablet   Other Relevant Orders   Hepatic function panel (Completed)   Lipase (Completed)   Uric acid (Completed)     Endocrine   DM (diabetes mellitus) ([place])    Current use of metformin 54m BID and victoza 1.84mLast HgbA1c of 8.3 Negative urine microalbumin, no ACE/ARB at this time LDL not at goal, current use of atorvastatin She is still to make appt with ophthalmology. Has not checked glucose at home, state she  does not  know how to use glucometer. Reports nausea and constipation in last 2weeks. No vomiting, no ABD pain. No jaundice.  under care of nutritionist, next appt 10/28/2018. Admits to not following diet in last 1week. Use of victoza x 32month, No weight loss noted. SKirke Shaggyis not covered by insurance. Wt Readings from Last 3 Encounters:  10/26/18 (!) 337 lb (152.9 kg)  09/21/18 (!) 334 lb (151.5 kg)  09/07/18 (!) 336 lb (152.4 kg)   Today's lab indicate elevated lipase and normal liver enzymes. She is to hold victoza x 2days, then repeat lipase and amylase. Continue metformin, and resume glipizide if GI symptoms resolve She is check glucose fasting and at bedtime. F/up in office in 280month       Relevant Orders   Hepatic function panel (Completed)   Lipase (Completed)   Uric acid (Completed)   Lipase   Amylase     Other   Morbid obesity with BMI of 50.0-59.9, adult ([facility]   Relevant Orders   Hepatic function panel (Completed)   Lipase (Completed)   Uric acid (Completed)    Other Visit Diagnoses    Hyperuricemia       Relevant Orders   Hepatic function panel (Completed)   Lipase (Completed)   Uric acid (Completed)   Need for influenza vaccination       Relevant Orders   Flu Vaccine QUAD High Dose(Fluad) (Completed)   Need for diphtheria-tetanus-pertussis (Tdap) vaccine       Relevant Orders   Tdap vaccine greater than or equal to 7yo IM (Completed)   Need for pneumococcal vaccination       Relevant Orders   Pneumococcal polysaccharide vaccine 23-valent greater than or equal to 2yo subcutaneous/IM (Completed)   Elevated lipase       Relevant Orders   Lipase   Amylase      Follow-up: Return in about 2 months (around 12/26/2018) for DM and HTN, hyperlipidemia.  ChWilfred LacyNP

## 2018-10-26 NOTE — Telephone Encounter (Signed)
PA for Victoza started, waiting for result.   Ruth [name] (Key: [id number])

## 2018-10-27 ENCOUNTER — Telehealth: Payer: Self-pay | Admitting: *Deleted

## 2018-10-27 ENCOUNTER — Encounter: Payer: Self-pay | Admitting: Nurse Practitioner

## 2018-10-27 LAB — HEPATIC FUNCTION PANEL
ALT: 22 IU/L (ref 0–32)
AST: 18 IU/L (ref 0–40)
Albumin: 4 g/dL (ref 3.8–4.9)
Alkaline Phosphatase: 92 IU/L (ref 39–117)
Bilirubin Total: 0.3 mg/dL (ref 0.0–1.2)
Bilirubin, Direct: 0.11 mg/dL (ref 0.00–0.40)
Total Protein: 6.5 g/dL (ref 6.0–8.5)

## 2018-10-27 LAB — LIPASE: Lipase: 80 U/L — ABNORMAL HIGH (ref 14–72)

## 2018-10-27 LAB — URIC ACID: Uric Acid: 6.1 mg/dL (ref 2.5–7.1)

## 2018-10-27 NOTE — Telephone Encounter (Signed)
"  I am scheduled to have surgery on Friday.  I did everything on line like I was supposed to.  I still haven't heard from anyone in regards to what time I'm supposed to be there."  You will receive a call from someone from the surgical center a day or two prior to your surgery date.  They will give you your arrival time

## 2018-10-27 NOTE — Assessment & Plan Note (Signed)
Current use of metformin 500mg  BID and victoza 1.8mg  Last HgbA1c of 8.3 Negative urine microalbumin, no ACE/ARB at this time LDL not at goal, current use of atorvastatin She is still to make appt with ophthalmology. Has not checked glucose at home, state she does not know how to use glucometer. Reports nausea and constipation in last 2weeks. No vomiting, no ABD pain. No jaundice.  under care of nutritionist, next appt 10/28/2018. Admits to not following diet in last 1week. Use of victoza x 56months, No weight loss noted. Kirke Shaggy is not covered by insurance. Wt Readings from Last 3 Encounters:  10/26/18 (!) 337 lb (152.9 kg)  09/21/18 (!) 334 lb (151.5 kg)  09/07/18 (!) 336 lb (152.4 kg)   Today's lab indicate elevated lipase and normal liver enzymes. She is to hold victoza x 2days, then repeat lipase and amylase. Continue metformin, and resume glipizide if GI symptoms resolve She is check glucose fasting and at bedtime. F/up in office in 35months.

## 2018-10-27 NOTE — Assessment & Plan Note (Signed)
BP at goal with amlodipine and atenolol. BP Readings from Last 3 Encounters:  10/26/18 122/70  09/21/18 127/81  09/07/18 (!) 154/89

## 2018-10-27 NOTE — Assessment & Plan Note (Signed)
Due to victoza injection Start senna.

## 2018-10-28 ENCOUNTER — Other Ambulatory Visit: Payer: Self-pay

## 2018-10-28 ENCOUNTER — Other Ambulatory Visit (INDEPENDENT_AMBULATORY_CARE_PROVIDER_SITE_OTHER): Payer: Self-pay | Admitting: Family Medicine

## 2018-10-28 ENCOUNTER — Encounter (INDEPENDENT_AMBULATORY_CARE_PROVIDER_SITE_OTHER): Payer: Self-pay | Admitting: Family Medicine

## 2018-10-28 ENCOUNTER — Telehealth: Payer: Self-pay

## 2018-10-28 ENCOUNTER — Telehealth (INDEPENDENT_AMBULATORY_CARE_PROVIDER_SITE_OTHER): Payer: BC Managed Care – PPO | Admitting: Family Medicine

## 2018-10-28 DIAGNOSIS — Z6841 Body Mass Index (BMI) 40.0 and over, adult: Secondary | ICD-10-CM | POA: Diagnosis not present

## 2018-10-28 DIAGNOSIS — E559 Vitamin D deficiency, unspecified: Secondary | ICD-10-CM | POA: Diagnosis not present

## 2018-10-28 DIAGNOSIS — E1165 Type 2 diabetes mellitus with hyperglycemia: Secondary | ICD-10-CM

## 2018-10-28 MED ORDER — VITAMIN D (ERGOCALCIFEROL) 1.25 MG (50000 UNIT) PO CAPS: 50000.0000 [IU] | ORAL_CAPSULE | ORAL | 0 refills | Status: DC

## 2018-10-28 NOTE — Telephone Encounter (Signed)

## 2018-10-28 NOTE — Addendum Note (Signed)
Addended by: Lynnea Ferrier on: 10/28/2018 02:00 PM   Modules accepted: Orders

## 2018-10-29 ENCOUNTER — Other Ambulatory Visit (INDEPENDENT_AMBULATORY_CARE_PROVIDER_SITE_OTHER): Payer: BC Managed Care – PPO

## 2018-10-29 ENCOUNTER — Other Ambulatory Visit: Payer: Self-pay | Admitting: Podiatry

## 2018-10-29 ENCOUNTER — Other Ambulatory Visit: Payer: Self-pay

## 2018-10-29 DIAGNOSIS — R748 Abnormal levels of other serum enzymes: Secondary | ICD-10-CM | POA: Diagnosis not present

## 2018-10-29 DIAGNOSIS — E1165 Type 2 diabetes mellitus with hyperglycemia: Secondary | ICD-10-CM | POA: Diagnosis not present

## 2018-10-29 MED ORDER — OXYCODONE-ACETAMINOPHEN 10-325 MG PO TABS: 1.0000 | ORAL_TABLET | Freq: Three times a day (TID) | ORAL | 0 refills | Status: AC | PRN

## 2018-10-29 MED ORDER — CEPHALEXIN 500 MG PO CAPS: 500.0000 mg | ORAL_CAPSULE | Freq: Three times a day (TID) | ORAL | 0 refills | Status: DC

## 2018-10-29 MED ORDER — ONDANSETRON HCL 4 MG PO TABS: 4.0000 mg | ORAL_TABLET | Freq: Three times a day (TID) | ORAL | 0 refills | Status: DC | PRN

## 2018-10-30 ENCOUNTER — Encounter: Payer: Self-pay | Admitting: Podiatry

## 2018-10-30 DIAGNOSIS — M25571 Pain in right ankle and joints of right foot: Secondary | ICD-10-CM | POA: Diagnosis not present

## 2018-10-30 DIAGNOSIS — Y939 Activity, unspecified: Secondary | ICD-10-CM | POA: Diagnosis not present

## 2018-10-30 DIAGNOSIS — Y929 Unspecified place or not applicable: Secondary | ICD-10-CM | POA: Diagnosis not present

## 2018-10-30 DIAGNOSIS — X58XXXA Exposure to other specified factors, initial encounter: Secondary | ICD-10-CM | POA: Diagnosis not present

## 2018-10-30 DIAGNOSIS — Y999 Unspecified external cause status: Secondary | ICD-10-CM | POA: Diagnosis not present

## 2018-10-30 DIAGNOSIS — T148XXA Other injury of unspecified body region, initial encounter: Secondary | ICD-10-CM | POA: Diagnosis not present

## 2018-10-30 DIAGNOSIS — S86311A Strain of muscle(s) and tendon(s) of peroneal muscle group at lower leg level, right leg, initial encounter: Secondary | ICD-10-CM | POA: Diagnosis not present

## 2018-10-30 DIAGNOSIS — M722 Plantar fascial fibromatosis: Secondary | ICD-10-CM | POA: Diagnosis not present

## 2018-10-30 DIAGNOSIS — M76821 Posterior tibial tendinitis, right leg: Secondary | ICD-10-CM | POA: Diagnosis not present

## 2018-10-30 DIAGNOSIS — I10 Essential (primary) hypertension: Secondary | ICD-10-CM | POA: Diagnosis not present

## 2018-10-30 LAB — LIPASE: Lipase: 111 U/L — ABNORMAL HIGH (ref 14–72)

## 2018-10-30 LAB — AMYLASE: Amylase: 55 U/L (ref 31–110)

## 2018-10-30 NOTE — Addendum Note (Signed)
Addended by: Leana Gamer on: 10/30/2018 09:57 AM   Modules accepted: Orders

## 2018-11-02 NOTE — Progress Notes (Signed)
Office: [phone]  /  Fax: [phone] TeleHealth Visit:  Ruth [name] has verbally consented to this TeleHealth visit today. The patient is located at home, the provider is located at the [facility] office. The participants in this visit include the listed provider and patient and any and all parties involved. The visit was conducted today via WebEx.  HPI:   Chief Complaint: OBESITY Ruth [name] is here to discuss her progress with her obesity treatment plan. She is on the Category 3 plan and is following her eating plan approximately 40 % of the time. She states she is exercising 0 minutes 0 times per week. Ruth [name] has not been following the plan as she should due to celebrating her birthday and visitors from [place]. She is now trying to get back on the plan. She has also been out of work because she will have foot surgery on 10/30/18 which has caused her to eat more. Ruth [name] admits to polyphagia. We were unable to weigh the patient today for this TeleHealth visit. She feels as if she has gained weight since her last visit. She has lost 2 lbs since starting treatment with [place].  Diabetes II with hyperglycemia Ruth [name] has a diagnosis of diabetes type II and she is not well controlled. Ruth [name] just started checking her blood sugars yesterday and she was at 121 fasting. Ruth [name] is on Victoza and Metformin. She admits to constipation. Victoza is on hold, due to increased lipase level. She has been working on intensive lifestyle modifications including diet, exercise, and weight loss to help control her blood glucose levels. Lab Results  Component Value Date   HGBA1C 8.3 (H) 08/21/2018    Vitamin D deficiency [name] has a diagnosis of vitamin D deficiency. She is currently taking vit D and her last vitamin D level was at 24.9 on 09/07/18 and was not at goal. Ruth [name] denies nausea, vomiting or muscle weakness.  ASSESSMENT AND PLAN:  Type 2 diabetes mellitus with hyperglycemia, without  long-term current use of insulin (HCC)  Vitamin D deficiency - Plan: Vitamin D, Ergocalciferol, (DRISDOL) 1.25 MG (50000 UT) CAPS capsule  Class 3 severe obesity with serious comorbidity and body mass index (BMI) of 50.0 to 59.9 in adult, unspecified obesity type ([place])  PLAN:  Diabetes II with hyperglycemia [name] has been given extensive diabetes education by myself today including ideal fasting and post-prandial blood glucose readings, individual ideal Hgb A1c goals and hypoglycemia prevention. We discussed the importance of good blood sugar control to decrease the likelihood of diabetic complications such as nephropathy, neuropathy, limb loss, blindness, coronary artery disease, and death. We discussed the importance of intensive lifestyle modification including diet, exercise and weight loss as the first line treatment for diabetes. Ruth [name] will follow up with her PCP about re-starting Victoza. She will follow up with our clinic at the agreed upon time.  Vitamin D Deficiency Ruth [name] was informed that low vitamin D levels contributes to fatigue and are associated with obesity, breast, and colon cancer. Ruth [name] agrees to continue prescription Vit D @50 ,000 IU every week #4 with no refills and she will follow up for routine testing of vitamin D, at least 2-3 times per year. She was informed of the risk of over-replacement of vitamin D and agrees to not increase her dose unless she discusses this with [place] first. Ruth [name] agrees to follow up with our clinic in 2 to 3 weeks.  Obesity Ruth [name] is currently in the action stage of change. As such, her goal  is to continue with weight loss efforts She has agreed to follow the Category 3 plan We discussed the following Behavioral Modification Strategies today: planning for success, reducing simple carbs, and increasing lean protein intake  Ruth [name] has agreed to follow up with our clinic in 2 to 3 weeks. She was informed of the importance of frequent follow up  visits to maximize her success with intensive lifestyle modifications for her multiple health conditions.  ALLERGIES: Allergies  Allergen Reactions  . Lisinopril Swelling  . Tape Other (See Comments)    Certain Band aids cause skin irritation    MEDICATIONS: Current Outpatient Medications on File Prior to Visit  Medication Sig Dispense Refill  . amLODipine (NORVASC) 5 MG tablet Take 1 tablet (5 mg total) by mouth at bedtime. 90 tablet 1  . atenolol (TENORMIN) 50 MG tablet Take 1 tablet (50 mg total) by mouth daily. 90 tablet 1  . atorvastatin (LIPITOR) 40 MG tablet Take 1 tablet (40 mg total) by mouth daily at 6 PM. 90 tablet 1  . Insulin Pen Needle (CLICKFINE PEN NEEDLES) 32G X 4 MM MISC To use with Victoza 100 each 3  . ketoconazole (NIZORAL) 2 % cream Apply 1 application topically 2 (two) times daily. 15 g 0  . latanoprost (XALATAN) 0.005 % ophthalmic solution Place 1 drop into both eyes at bedtime.   11  . liraglutide (VICTOZA) 18 MG/3ML SOPN Inject 0.3 mLs (1.8 mg total) into the skin every morning. 9 mL 0  . metFORMIN (GLUCOPHAGE) 500 MG tablet Take 1 tablet (500 mg total) by mouth daily with breakfast. 90 tablet 1  . Multiple Vitamins-Minerals (MULTIVITAMIN WITH MINERALS) tablet Take 1 tablet by mouth daily.    Marland Kitchen senna-docusate (SENOKOT-S) 8.6-50 MG tablet Take 1 tablet by mouth at bedtime. 90 tablet 1  . traMADol (ULTRAM) 50 MG tablet Take by mouth every 6 (six) hours as needed.     No current facility-administered medications on file prior to visit.     PAST MEDICAL HISTORY: Past Medical History:  Diagnosis Date  . Anemia   . Arthritis   . Back pain   . Bilateral calcaneal spurs   . Diabetes mellitus without complication ([place])    type 2  . GERD (gastroesophageal reflux disease)   . Gout   . Hyperlipidemia   . Hypertension   . Obesity   . Osteoarthritis   . Palpitations   . Swallowing difficulty   . Vitamin D deficiency     PAST SURGICAL HISTORY: Past Surgical  History:  Procedure Laterality Date  . ABDOMINAL HYSTERECTOMY     partial  . CHOLECYSTECTOMY    . COLONOSCOPY WITH PROPOFOL N/A 10/29/2016   Procedure: COLONOSCOPY WITH PROPOFOL;  Surgeon: Yetta Flock, MD;  Location: [facility];  Service: Gastroenterology;  Laterality: N/A;  . DENTAL SURGERY    . ORIF TIBIA & FIBULA FRACTURES Left     SOCIAL HISTORY: Social History   Tobacco Use  . Smoking status: Former Smoker    Packs/day: 1.00    Years: 20.00    Pack years: 20.00    Quit date: 02/12/2008    Years since quitting: 10.7  . Smokeless tobacco: Never Used  Substance Use Topics  . Alcohol use: No  . Drug use: No    FAMILY HISTORY: Family History  Problem Relation Age of Onset  . Diabetes Mother   . Stroke Mother   . Dementia Mother   . Hypertension Mother   . Eating disorder  Mother   . Obesity Mother   . Pancreatic cancer Father   . Sudden death Father   . Alcoholism Father   . Colon polyps Sister   . Kidney disease Sister   . Cancer Maternal Grandmother        type unknown    ROS: Review of Systems  Constitutional: Negative for weight loss.  Gastrointestinal: Positive for constipation. Negative for nausea and vomiting.  Musculoskeletal:       Negative for muscle weakness  Endo/Heme/Allergies:       Positive for polyphagia    PHYSICAL EXAM: Pt in no acute distress  RECENT LABS AND TESTS: BMET    Component Value Date/Time   NA 143 08/21/2018 1401   NA 140 08/22/2017 0931   K 4.3 08/21/2018 1401   CL 105 08/21/2018 1401   CO2 30 08/21/2018 1401   GLUCOSE 172 (H) 08/21/2018 1401   BUN 14 08/21/2018 1401   BUN 21 08/22/2017 0931   CREATININE 0.84 08/21/2018 1401   CALCIUM 9.8 08/21/2018 1401   GFRNONAA >60 10/30/2017 0712   GFRAA >60 10/30/2017 0712   Lab Results  Component Value Date   HGBA1C 8.3 (H) 08/21/2018   HGBA1C 6.2 (A) 02/03/2018   HGBA1C 6.5 (H) 08/22/2017   HGBA1C 7.6 (H) 08/02/2016   HGBA1C 7.6 07/20/2015   No results  found for: INSULIN CBC    Component Value Date/Time   WBC 7.4 10/30/2017 0712   RBC 4.27 10/30/2017 0712   HGB 12.7 10/30/2017 0712   HGB 12.6 08/22/2017 0931   HCT 39.4 10/30/2017 0712   HCT 39.5 08/22/2017 0931   PLT 253 10/30/2017 0712   PLT 275 08/22/2017 0931   MCV 92.3 10/30/2017 0712   MCV 91 08/22/2017 0931   MCH 29.7 10/30/2017 0712   MCHC 32.2 10/30/2017 0712   RDW 14.2 10/30/2017 0712   RDW 14.5 08/22/2017 0931   Iron/TIBC/Ferritin/ %Sat No results found for: IRON, TIBC, FERRITIN, IRONPCTSAT Lipid Panel     Component Value Date/Time   CHOL 210 (H) 08/21/2018 1401   CHOL 186 08/22/2017 0931   TRIG 95 08/21/2018 1401   HDL 52 08/21/2018 1401   HDL 62 08/22/2017 0931   CHOLHDL 4.0 08/21/2018 1401   VLDL 22.0 08/02/2016 1659   LDLCALC 138 (H) 08/21/2018 1401   Hepatic Function Panel     Component Value Date/Time   PROT 6.5 10/26/2018 1607   ALBUMIN 4.0 10/26/2018 1607   AST 18 10/26/2018 1607   ALT 22 10/26/2018 1607   ALKPHOS 92 10/26/2018 1607   BILITOT 0.3 10/26/2018 1607   BILIDIR 0.11 10/26/2018 1607   IBILI 0.3 08/21/2018 1401      Component Value Date/Time   TSH 1.760 09/07/2018 1222   TSH 1.730 08/22/2017 0931   TSH 1.88 08/02/2016 1659     Ref. Range 09/07/2018 12:22  Vitamin D, 25-Hydroxy Latest Ref Range: 30.0 - 100.0 ng/mL 24.9 (L)    I, Doreene Nest, am acting as Location manager for [employer], FNP-C  I have reviewed the above documentation for accuracy and completeness, and I agree with the above.  - Tashiana Lamarca, FNP-C.

## 2018-11-03 ENCOUNTER — Other Ambulatory Visit: Payer: Self-pay

## 2018-11-03 ENCOUNTER — Ambulatory Visit (INDEPENDENT_AMBULATORY_CARE_PROVIDER_SITE_OTHER): Payer: BC Managed Care – PPO

## 2018-11-03 ENCOUNTER — Encounter: Payer: Self-pay | Admitting: Podiatry

## 2018-11-03 ENCOUNTER — Ambulatory Visit (INDEPENDENT_AMBULATORY_CARE_PROVIDER_SITE_OTHER): Payer: Self-pay | Admitting: Podiatry

## 2018-11-03 VITALS — BP 151/82 | HR 74 | Temp 97.3°F

## 2018-11-03 DIAGNOSIS — Z09 Encounter for follow-up examination after completed treatment for conditions other than malignant neoplasm: Secondary | ICD-10-CM

## 2018-11-03 DIAGNOSIS — T148XXA Other injury of unspecified body region, initial encounter: Secondary | ICD-10-CM | POA: Diagnosis not present

## 2018-11-03 DIAGNOSIS — M76821 Posterior tibial tendinitis, right leg: Secondary | ICD-10-CM

## 2018-11-03 NOTE — Progress Notes (Signed)
She presents today for a first postop visit date of surgery 10/31/2018 status post endoscopic plantar fasciotomy peroneal tendon repair and Kidner procedure.  Objective: Vital signs are stable alert and oriented x3 cast is intact clean on the bottom.  Toes are easily movable she has good range of motion dorsiflexion plantarflexion of the toes.  Cast is loose at the top.  Radiographs demonstrate intact Arthrex anchor.  Assessment: Well-healing surgical foot.  Plan: Follow-up with her in 1 week for redress.

## 2018-11-04 DIAGNOSIS — E559 Vitamin D deficiency, unspecified: Secondary | ICD-10-CM | POA: Insufficient documentation

## 2018-11-12 ENCOUNTER — Other Ambulatory Visit: Payer: Self-pay

## 2018-11-12 ENCOUNTER — Ambulatory Visit (INDEPENDENT_AMBULATORY_CARE_PROVIDER_SITE_OTHER): Payer: BC Managed Care – PPO | Admitting: Podiatry

## 2018-11-12 ENCOUNTER — Encounter: Payer: Self-pay | Admitting: Podiatry

## 2018-11-12 VITALS — BP 139/81 | HR 72 | Temp 98.0°F

## 2018-11-12 DIAGNOSIS — Z09 Encounter for follow-up examination after completed treatment for conditions other than malignant neoplasm: Secondary | ICD-10-CM

## 2018-11-14 NOTE — Progress Notes (Signed)
She presents today states that her foot feels fine.  She is status post peroneal tendon and posterior tibial tendon repairs date of surgery 10/31/2018 right foot she denies fever chills nausea vomiting muscle aches pains calf pain back pain chest pain shortness of breath confirms that she is using crutches or the knee scooter at all times.  Objective: Vital signs are stable alert and oriented x3 presents today nonweightbearing cast intact dry and clean once removed demonstrates no erythema edema cellulitis drainage or odor.  Staples are intact medially and laterally mild edema no signs of infection.  Good range of motion dorsiflexion plantarflexion inversion and eversion  Assessment: Well-healing surgical foot.  Plan: Redressed today dressed a compressive dressing.  Also replaced her cast.  She will follow-up with [place] in 2 weeks for cast removal staple removal and boot application.

## 2018-11-17 ENCOUNTER — Other Ambulatory Visit: Payer: Self-pay

## 2018-11-17 ENCOUNTER — Ambulatory Visit (INDEPENDENT_AMBULATORY_CARE_PROVIDER_SITE_OTHER): Payer: BC Managed Care – PPO | Admitting: Bariatrics

## 2018-11-17 DIAGNOSIS — E559 Vitamin D deficiency, unspecified: Secondary | ICD-10-CM

## 2018-11-17 DIAGNOSIS — Z6841 Body Mass Index (BMI) 40.0 and over, adult: Secondary | ICD-10-CM | POA: Diagnosis not present

## 2018-11-17 DIAGNOSIS — E119 Type 2 diabetes mellitus without complications: Secondary | ICD-10-CM | POA: Diagnosis not present

## 2018-11-17 MED ORDER — VITAMIN D (ERGOCALCIFEROL) 1.25 MG (50000 UNIT) PO CAPS: 50000.0000 [IU] | ORAL_CAPSULE | ORAL | 0 refills | Status: DC

## 2018-11-17 NOTE — Progress Notes (Signed)
Office: [phone]  /  Fax: [phone] TeleHealth Visit:  Ruth [name] has verbally consented to this TeleHealth visit today. The patient is located at home, the provider is located at the [employer] [facility] office. The participants in this visit include the listed provider and patient. The visit was conducted today via Webex.  HPI:   Chief Complaint: OBESITY Ruth [name] is here to discuss her progress with her obesity treatment plan. She is on the Category 3 plan and is following her eating plan approximately 50% of the time. She states she is exercising 0 minutes 0 times per week. [name] states that her weight has remained the same. She had surgery on her peroneal tendon and is less active. She reports doing well with her water intake. We were unable to weigh the patient today for this TeleHealth visit. She feels as if she has maintained her weight since her last visit. She has lost 2 lbs since starting treatment with [place].  Diabetes II Ruth [name] has a diagnosis of diabetes type II. Ruth [name] has not checked her blood sugars recently. Her last fasting blood sugar was 130. Last A1c was 8.3 on 08/21/2018. She reports no symptoms of low blood sugar. She has been working on intensive lifestyle modifications including diet, exercise, and weight loss to help control her blood glucose levels.  Vitamin D deficiency Ruth [name] has a diagnosis of Vitamin D deficiency. She is currently taking prescription Vit D and denies nausea, vomiting or muscle weakness.  ASSESSMENT AND PLAN:  Vitamin D deficiency - Plan: Vitamin D, Ergocalciferol, (DRISDOL) 1.25 MG (50000 UT) CAPS capsule  Type 2 diabetes mellitus without complication, without long-term current use of insulin (HCC)  Class 3 severe obesity due to excess calories with serious comorbidity and body mass index (BMI) of 50.0 to 59.9 in adult [facility])  PLAN:  Diabetes II Ruth [name] has been given extensive diabetes education by myself today including ideal  fasting and post-prandial blood glucose readings, individual ideal HgA1c goals  and hypoglycemia prevention. We discussed the importance of good blood sugar control to decrease the likelihood of diabetic complications such as nephropathy, neuropathy, limb loss, blindness, coronary artery disease, and death. We discussed the importance of intensive lifestyle modification including diet, exercise and weight loss as the first line treatment for diabetes. Ruth [name] was instructed to decrease carbohydrates, increase protein, increase healthy fats, and increase activity. She will follow-up with [place] as directed to monitor her progress.  Vitamin D Deficiency Ruth [name] was informed that low Vitamin D levels contributes to fatigue and are associated with obesity, breast, and colon cancer. She agrees to continue to take prescription Vit D @ 50,000 IU every week #4 with 0 refills and will follow-up for routine testing of Vitamin D, at least 2-3 times per year. She was informed of the risk of over-replacement of Vitamin D and agrees to not increase her dose unless she discusses this with [place] first. Ruth [name] agrees to follow-up with our clinic in 2 weeks.  Obesity Ruth [name] is currently in the action stage of change. As such, her goal is to continue with weight loss efforts. She has agreed to follow the Category 3 plan. Ruth [name] will work on meal planning, intentional eating, and increasing her protein intake). Ruth [name] has been instructed to resume activity when her surgeon deems appropriate for weight loss and overall health benefits. We discussed the following Behavioral Modification Strategies today: increasing lean protein intake, decreasing simple carbohydrates, increasing vegetables, increase H20 intake, decrease eating out, no skipping meals,  work on meal planning and easy cooking plans, keeping healthy foods in the home, and planning for success.  Ruth [name] has agreed to follow-up with our clinic in 2 weeks. She was informed of  the importance of frequent follow-up visits to maximize her success with intensive lifestyle modifications for her multiple health conditions.  ALLERGIES: Allergies  Allergen Reactions   Lisinopril Swelling   Tape Other (See Comments)    Certain Band aids cause skin irritation    MEDICATIONS: Current Outpatient Medications on File Prior to Visit  Medication Sig Dispense Refill   amLODipine (NORVASC) 5 MG tablet Take 1 tablet (5 mg total) by mouth at bedtime. 90 tablet 1   atenolol (TENORMIN) 50 MG tablet Take 1 tablet (50 mg total) by mouth daily. 90 tablet 1   atorvastatin (LIPITOR) 40 MG tablet Take 1 tablet (40 mg total) by mouth daily at 6 PM. 90 tablet 1   cephALEXin (KEFLEX) 500 MG capsule Take 1 capsule (500 mg total) by mouth 3 (three) times daily. 30 capsule 0   Insulin Pen Needle (CLICKFINE PEN NEEDLES) 32G X 4 MM MISC To use with Victoza 100 each 3   ketoconazole (NIZORAL) 2 % cream Apply 1 application topically 2 (two) times daily. 15 g 0   latanoprost (XALATAN) 0.005 % ophthalmic solution Place 1 drop into both eyes at bedtime.   11   liraglutide (VICTOZA) 18 MG/3ML SOPN Inject 0.3 mLs (1.8 mg total) into the skin every morning. 9 mL 0   metFORMIN (GLUCOPHAGE) 500 MG tablet Take 1 tablet (500 mg total) by mouth daily with breakfast. 90 tablet 1   Multiple Vitamins-Minerals (MULTIVITAMIN WITH MINERALS) tablet Take 1 tablet by mouth daily.     ondansetron (ZOFRAN) 4 MG tablet Take 1 tablet (4 mg total) by mouth every 8 (eight) hours as needed. 20 tablet 0   senna-docusate (SENOKOT-S) 8.6-50 MG tablet Take 1 tablet by mouth at bedtime. 90 tablet 1   traMADol (ULTRAM) 50 MG tablet Take by mouth every 6 (six) hours as needed.     No current facility-administered medications on file prior to visit.     PAST MEDICAL HISTORY: Past Medical History:  Diagnosis Date   Anemia    Arthritis    Back pain    Bilateral calcaneal spurs    Diabetes mellitus without  complication (HCC)    type 2   GERD (gastroesophageal reflux disease)    Gout    Hyperlipidemia    Hypertension    Obesity    Osteoarthritis    Palpitations    Swallowing difficulty    Vitamin D deficiency     PAST SURGICAL HISTORY: Past Surgical History:  Procedure Laterality Date   ABDOMINAL HYSTERECTOMY     partial   CHOLECYSTECTOMY     COLONOSCOPY WITH PROPOFOL N/A 10/29/2016   Procedure: COLONOSCOPY WITH PROPOFOL;  Surgeon: Yetta Flock, MD;  Location: [facility];  Service: Gastroenterology;  Laterality: N/A;   DENTAL SURGERY     ORIF TIBIA & FIBULA FRACTURES Left     SOCIAL HISTORY: Social History   Tobacco Use   Smoking status: Former Smoker    Packs/day: 1.00    Years: 20.00    Pack years: 20.00    Quit date: 02/12/2008    Years since quitting: 10.7   Smokeless tobacco: Never Used  Substance Use Topics   Alcohol use: No   Drug use: No    FAMILY HISTORY: Family History  Problem Relation Age of  Onset   Diabetes Mother    Stroke Mother    Dementia Mother    Hypertension Mother    Eating disorder Mother    Obesity Mother    Pancreatic cancer Father    Sudden death Father    Alcoholism Father    Colon polyps Sister    Kidney disease Sister    Cancer Maternal Grandmother        type unknown   ROS: Review of Systems  Gastrointestinal: Negative for nausea and vomiting.  Musculoskeletal:       Negative for muscle weakness.   PHYSICAL EXAM: Pt in no acute distress  RECENT LABS AND TESTS: BMET    Component Value Date/Time   NA 143 08/21/2018 1401   NA 140 08/22/2017 0931   K 4.3 08/21/2018 1401   CL 105 08/21/2018 1401   CO2 30 08/21/2018 1401   GLUCOSE 172 (H) 08/21/2018 1401   BUN 14 08/21/2018 1401   BUN 21 08/22/2017 0931   CREATININE 0.84 08/21/2018 1401   CALCIUM 9.8 08/21/2018 1401   GFRNONAA >60 10/30/2017 0712   GFRAA >60 10/30/2017 0712   Lab Results  Component Value Date   HGBA1C 8.3 (H)  08/21/2018   HGBA1C 6.2 (A) 02/03/2018   HGBA1C 6.5 (H) 08/22/2017   HGBA1C 7.6 (H) 08/02/2016   HGBA1C 7.6 07/20/2015   No results found for: INSULIN CBC    Component Value Date/Time   WBC 7.4 10/30/2017 0712   RBC 4.27 10/30/2017 0712   HGB 12.7 10/30/2017 0712   HGB 12.6 08/22/2017 0931   HCT 39.4 10/30/2017 0712   HCT 39.5 08/22/2017 0931   PLT 253 10/30/2017 0712   PLT 275 08/22/2017 0931   MCV 92.3 10/30/2017 0712   MCV 91 08/22/2017 0931   MCH 29.7 10/30/2017 0712   MCHC 32.2 10/30/2017 0712   RDW 14.2 10/30/2017 0712   RDW 14.5 08/22/2017 0931   Iron/TIBC/Ferritin/ %Sat No results found for: IRON, TIBC, FERRITIN, IRONPCTSAT Lipid Panel     Component Value Date/Time   CHOL 210 (H) 08/21/2018 1401   CHOL 186 08/22/2017 0931   TRIG 95 08/21/2018 1401   HDL 52 08/21/2018 1401   HDL 62 08/22/2017 0931   CHOLHDL 4.0 08/21/2018 1401   VLDL 22.0 08/02/2016 1659   LDLCALC 138 (H) 08/21/2018 1401   Hepatic Function Panel     Component Value Date/Time   PROT 6.5 10/26/2018 1607   ALBUMIN 4.0 10/26/2018 1607   AST 18 10/26/2018 1607   ALT 22 10/26/2018 1607   ALKPHOS 92 10/26/2018 1607   BILITOT 0.3 10/26/2018 1607   BILIDIR 0.11 10/26/2018 1607   IBILI 0.3 08/21/2018 1401      Component Value Date/Time   TSH 1.760 09/07/2018 1222   TSH 1.730 08/22/2017 0931   TSH 1.88 08/02/2016 1659   Results for NARALY, GOODLOE (MRN [id number]) as of 11/17/2018 15:35  Ref. Range 09/07/2018 12:22  Vitamin D, 25-Hydroxy Latest Ref Range: 30.0 - 100.0 ng/mL 24.9 (L)   I, Michaelene Song, am acting as Location manager for [employer], DO  I have reviewed the above documentation for accuracy and completeness, and I agree with the above. -Jearld Lesch, DO

## 2018-11-18 ENCOUNTER — Encounter (INDEPENDENT_AMBULATORY_CARE_PROVIDER_SITE_OTHER): Payer: Self-pay | Admitting: Bariatrics

## 2018-11-26 ENCOUNTER — Ambulatory Visit (INDEPENDENT_AMBULATORY_CARE_PROVIDER_SITE_OTHER): Payer: Self-pay | Admitting: Podiatry

## 2018-11-26 ENCOUNTER — Other Ambulatory Visit: Payer: Self-pay

## 2018-11-26 DIAGNOSIS — Z09 Encounter for follow-up examination after completed treatment for conditions other than malignant neoplasm: Secondary | ICD-10-CM

## 2018-11-26 NOTE — Progress Notes (Signed)
She presents today date of surgery 10/30/2018 status post endoscopic plantar fasciotomy peroneal tendon repair and posterior tibial tendon repair with a Kidner advance tendon placement and cast application.  Objective: Vital signs are stable she is alert and oriented x3 presents today in a cast nonweightbearing with a knee scooter.  Dry sterile clean cast plantar aspect appears to be nice and neat and sterile.  Was removed demonstrates no erythematous moderate edema no cellulitis drainage or odor staples are intact once removed demonstrates coaptation is good.  She has good dorsiflexion plantarflexion inversion and eversion with some stiffness on inversion.  Assessment: Well-healing surgical foot.  Plan: Cam walker today I will follow up with her in 2 weeks and she will start standing in a static fashion 1 week from today.  She will continue to sleep with the boot at night.

## 2018-12-01 ENCOUNTER — Ambulatory Visit: Payer: BC Managed Care – PPO

## 2018-12-01 ENCOUNTER — Encounter (INDEPENDENT_AMBULATORY_CARE_PROVIDER_SITE_OTHER): Payer: Self-pay | Admitting: Bariatrics

## 2018-12-01 ENCOUNTER — Ambulatory Visit (INDEPENDENT_AMBULATORY_CARE_PROVIDER_SITE_OTHER): Payer: BC Managed Care – PPO | Admitting: Bariatrics

## 2018-12-01 ENCOUNTER — Other Ambulatory Visit: Payer: Self-pay

## 2018-12-01 DIAGNOSIS — E559 Vitamin D deficiency, unspecified: Secondary | ICD-10-CM

## 2018-12-01 DIAGNOSIS — Z6841 Body Mass Index (BMI) 40.0 and over, adult: Secondary | ICD-10-CM | POA: Diagnosis not present

## 2018-12-01 DIAGNOSIS — Z09 Encounter for follow-up examination after completed treatment for conditions other than malignant neoplasm: Secondary | ICD-10-CM

## 2018-12-01 DIAGNOSIS — E119 Type 2 diabetes mellitus without complications: Secondary | ICD-10-CM | POA: Diagnosis not present

## 2018-12-01 DIAGNOSIS — M76821 Posterior tibial tendinitis, right leg: Secondary | ICD-10-CM

## 2018-12-02 ENCOUNTER — Encounter (INDEPENDENT_AMBULATORY_CARE_PROVIDER_SITE_OTHER): Payer: Self-pay | Admitting: Bariatrics

## 2018-12-02 NOTE — Progress Notes (Signed)
Office: [phone]  /  Fax: [phone] TeleHealth Visit:  Ruth [name] has verbally consented to this TeleHealth visit today. The patient is located at home, the provider is located at the [employer] [facility] office. The participants in this visit include the listed provider and patient. The visit was conducted today via telephone call.  HPI:   Chief Complaint: OBESITY Ruth [name] is here to discuss her progress with her obesity treatment plan. She is on the Category 3 plan and is following her eating plan approximately 50-60% of the time. She states she is exercising 0 minutes 0 times per week. Tytionna states that she has maintained her weight. She is moving around a little since her surgery on her right foot. She reports drinking more water.  We were unable to weigh the patient today for this TeleHealth visit. She feels as if she has maintained her weight since her last visit. She has lost 2 lbs since starting treatment with [place].  Vitamin D deficiency Ruth [name] has a diagnosis of Vitamin D deficiency. Last Vitamin D 24.9 on 09/07/2018. She is currently taking Vit D and denies nausea, vomiting or muscle weakness.  Diabetes II Ruth [name] has a diagnosis of diabetes type II. Ruth [name] states fasting blood sugars are in the 130's with no highs and no lows. Last A1c was 8.3 on 08/21/2018. She has been working on intensive lifestyle modifications including diet, exercise, and weight loss to help control her blood glucose levels.  ASSESSMENT AND PLAN:  Vitamin D deficiency  Type 2 diabetes mellitus without complication, without long-term current use of insulin (HCC)  Class 3 severe obesity due to excess calories with serious comorbidity and body mass index (BMI) of 50.0 to 59.9 in adult [facility])  PLAN:  Vitamin D Deficiency Ruth [name] was informed that low Vitamin D levels contributes to fatigue and are associated with obesity, breast, and colon cancer. She agrees to continue taking Vit D and will  follow-up for routine testing of Vitamin D, at least 2-3 times per year. She was informed of the risk of over-replacement of Vitamin D and agrees to not increase her dose unless she discusses this with [place] first. Ruth [name] agrees to follow-up with our clinic in 2-3 weeks.  Pre-Diabetes Ruth [name] will continue to work on weight loss, exercise, and decreasing simple carbohydrates in her diet to help decrease the risk of diabetes. We dicussed metformin including benefits and risks. She was informed that eating too many simple carbohydrates or too many calories at one sitting increases the likelihood of GI side effects. Ruth [name] will continue her medications and follow-up as directed to monitor her progress.  Obesity Ruth [name] is currently in the action stage of change. As such, her goal is to continue with weight loss efforts. She has agreed to follow the Category 3 plan. Ruth [name] will work on meal planning, intentional eating, increasing her protein, and decreasing her carbohydrates. Ruth [name] has been instructed to work up to a goal of 150 minutes of combined cardio and strengthening exercise per week for weight loss and overall health benefits. We discussed the following Behavioral Modification Strategies today: increasing lean protein intake, decreasing simple carbohydrates, increasing vegetables, increase H20 intake, decrease eating out, no skipping meals, work on meal planning and easy cooking plans, keeping healthy foods in the home, and planning for success.  Ruth [name] has agreed to follow-up with our clinic in 2-3 weeks. She was informed of the importance of frequent follow-up visits to maximize her success with intensive lifestyle modifications for her  multiple health conditions.  ALLERGIES: Allergies  Allergen Reactions  . Lisinopril Swelling  . Tape Other (See Comments)    Certain Band aids cause skin irritation    MEDICATIONS: Current Outpatient Medications on File Prior to Visit  Medication Sig  Dispense Refill  . amLODipine (NORVASC) 5 MG tablet Take 1 tablet (5 mg total) by mouth at bedtime. 90 tablet 1  . atenolol (TENORMIN) 50 MG tablet Take 1 tablet (50 mg total) by mouth daily. 90 tablet 1  . atorvastatin (LIPITOR) 40 MG tablet Take 1 tablet (40 mg total) by mouth daily at 6 PM. 90 tablet 1  . cephALEXin (KEFLEX) 500 MG capsule Take 1 capsule (500 mg total) by mouth 3 (three) times daily. 30 capsule 0  . Insulin Pen Needle (CLICKFINE PEN NEEDLES) 32G X 4 MM MISC To use with Victoza 100 each 3  . latanoprost (XALATAN) 0.005 % ophthalmic solution Place 1 drop into both eyes at bedtime.   11  . metFORMIN (GLUCOPHAGE) 500 MG tablet Take 1 tablet (500 mg total) by mouth daily with breakfast. 90 tablet 1  . Multiple Vitamins-Minerals (MULTIVITAMIN WITH MINERALS) tablet Take 1 tablet by mouth daily.    . traMADol (ULTRAM) 50 MG tablet Take by mouth every 6 (six) hours as needed.    . Vitamin D, Ergocalciferol, (DRISDOL) 1.25 MG (50000 UT) CAPS capsule Take 1 capsule (50,000 Units total) by mouth every 7 (seven) days. 4 capsule 0   No current facility-administered medications on file prior to visit.     PAST MEDICAL HISTORY: Past Medical History:  Diagnosis Date  . Anemia   . Arthritis   . Back pain   . Bilateral calcaneal spurs   . Diabetes mellitus without complication ([place])    type 2  . GERD (gastroesophageal reflux disease)   . Gout   . Hyperlipidemia   . Hypertension   . Obesity   . Osteoarthritis   . Palpitations   . Swallowing difficulty   . Vitamin D deficiency     PAST SURGICAL HISTORY: Past Surgical History:  Procedure Laterality Date  . ABDOMINAL HYSTERECTOMY     partial  . CHOLECYSTECTOMY    . COLONOSCOPY WITH PROPOFOL N/A 10/29/2016   Procedure: COLONOSCOPY WITH PROPOFOL;  Surgeon: Yetta Flock, MD;  Location: [facility];  Service: Gastroenterology;  Laterality: N/A;  . DENTAL SURGERY    . ORIF TIBIA & FIBULA FRACTURES Left     SOCIAL  HISTORY: Social History   Tobacco Use  . Smoking status: Former Smoker    Packs/day: 1.00    Years: 20.00    Pack years: 20.00    Quit date: 02/12/2008    Years since quitting: 10.8  . Smokeless tobacco: Never Used  Substance Use Topics  . Alcohol use: No  . Drug use: No    FAMILY HISTORY: Family History  Problem Relation Age of Onset  . Diabetes Mother   . Stroke Mother   . Dementia Mother   . Hypertension Mother   . Eating disorder Mother   . Obesity Mother   . Pancreatic cancer Father   . Sudden death Father   . Alcoholism Father   . Colon polyps Sister   . Kidney disease Sister   . Cancer Maternal Grandmother        type unknown   ROS: Review of Systems  Gastrointestinal: Negative for nausea and vomiting.  Musculoskeletal:       Negative for muscle weakness.   PHYSICAL  EXAM: Pt in no acute distress  RECENT LABS AND TESTS: BMET    Component Value Date/Time   NA 143 08/21/2018 1401   NA 140 08/22/2017 0931   K 4.3 08/21/2018 1401   CL 105 08/21/2018 1401   CO2 30 08/21/2018 1401   GLUCOSE 172 (H) 08/21/2018 1401   BUN 14 08/21/2018 1401   BUN 21 08/22/2017 0931   CREATININE 0.84 08/21/2018 1401   CALCIUM 9.8 08/21/2018 1401   GFRNONAA >60 10/30/2017 0712   GFRAA >60 10/30/2017 0712   Lab Results  Component Value Date   HGBA1C 8.3 (H) 08/21/2018   HGBA1C 6.2 (A) 02/03/2018   HGBA1C 6.5 (H) 08/22/2017   HGBA1C 7.6 (H) 08/02/2016   HGBA1C 7.6 07/20/2015   No results found for: INSULIN CBC    Component Value Date/Time   WBC 7.4 10/30/2017 0712   RBC 4.27 10/30/2017 0712   HGB 12.7 10/30/2017 0712   HGB 12.6 08/22/2017 0931   HCT 39.4 10/30/2017 0712   HCT 39.5 08/22/2017 0931   PLT 253 10/30/2017 0712   PLT 275 08/22/2017 0931   MCV 92.3 10/30/2017 0712   MCV 91 08/22/2017 0931   MCH 29.7 10/30/2017 0712   MCHC 32.2 10/30/2017 0712   RDW 14.2 10/30/2017 0712   RDW 14.5 08/22/2017 0931   Iron/TIBC/Ferritin/ %Sat No results found for:  IRON, TIBC, FERRITIN, IRONPCTSAT Lipid Panel     Component Value Date/Time   CHOL 210 (H) 08/21/2018 1401   CHOL 186 08/22/2017 0931   TRIG 95 08/21/2018 1401   HDL 52 08/21/2018 1401   HDL 62 08/22/2017 0931   CHOLHDL 4.0 08/21/2018 1401   VLDL 22.0 08/02/2016 1659   LDLCALC 138 (H) 08/21/2018 1401   Hepatic Function Panel     Component Value Date/Time   PROT 6.5 10/26/2018 1607   ALBUMIN 4.0 10/26/2018 1607   AST 18 10/26/2018 1607   ALT 22 10/26/2018 1607   ALKPHOS 92 10/26/2018 1607   BILITOT 0.3 10/26/2018 1607   BILIDIR 0.11 10/26/2018 1607   IBILI 0.3 08/21/2018 1401      Component Value Date/Time   TSH 1.760 09/07/2018 1222   TSH 1.730 08/22/2017 0931   TSH 1.88 08/02/2016 1659   Results for GAVRIELLA, STRATFORD (MRN [id number]) as of 12/02/2018 12:21  Ref. Range 09/07/2018 12:22  Vitamin D, 25-Hydroxy Latest Ref Range: 30.0 - 100.0 ng/mL 24.9 (L)   I, Michaelene Song, am acting as Location manager for [employer], DO  I have reviewed the above documentation for accuracy and completeness, and I agree with the above. -Jearld Lesch, DO

## 2018-12-10 ENCOUNTER — Other Ambulatory Visit: Payer: Self-pay

## 2018-12-10 ENCOUNTER — Encounter: Payer: Self-pay | Admitting: Podiatry

## 2018-12-10 ENCOUNTER — Ambulatory Visit (INDEPENDENT_AMBULATORY_CARE_PROVIDER_SITE_OTHER): Payer: BC Managed Care – PPO | Admitting: Podiatry

## 2018-12-10 DIAGNOSIS — Z09 Encounter for follow-up examination after completed treatment for conditions other than malignant neoplasm: Secondary | ICD-10-CM

## 2018-12-10 DIAGNOSIS — T148XXA Other injury of unspecified body region, initial encounter: Secondary | ICD-10-CM

## 2018-12-10 DIAGNOSIS — M722 Plantar fascial fibromatosis: Secondary | ICD-10-CM

## 2018-12-10 NOTE — Progress Notes (Signed)
She presents today date of surgery 10/30/2018 status post EPF and repair.  Tendon and Kidner procedure right foot.  States that is still little bit of pain in it but she has not tried to walk on it.  She denies fever chills nausea vomiting muscle aches and pain.  Continues to use her knee scooter.  Objective: Vital signs are stable alert and oriented x3 presents today in a knee scooter with an Ace bandage around the right foot once removed demonstrates Steri-Strips to the lateral incision medial incision and lateral incision appear to be well coapted with exception of one small area to the very top margin of the perineal area.  There is no purulence no malodor and it appears to be just slightly macerated possibly due to sweat but I see no signs of infection.  She has good inversion and eversion of the foot against resistance.  Assessment: Well-healing surgical foot ankle right.  Plan: At this point I am going to recommend she dressed the wound daily let this go on to heal up she can soak in Epson salts and warm water for just a few minutes every other day or so and apply a dressing daily to the wound.  She will continue to utilize her cam walker and I highly recommended partial weightbearing for the next week progressing to full weightbearing in a week's time.  Follow-up with her in about 2 weeks

## 2018-12-15 ENCOUNTER — Telehealth: Payer: Self-pay | Admitting: Podiatry

## 2018-12-15 NOTE — Telephone Encounter (Signed)
Pt had surgery on 10/30/18 and would like to know if she is [place] to take the black boot off and put on the blue boot so that she can sleep at night. Please give patient a call.

## 2018-12-15 NOTE — Telephone Encounter (Signed)
I informed pt it would be fine to sleep in the blue night splint and walk in the boot. Pt states understanding.

## 2018-12-18 NOTE — Progress Notes (Signed)
Patient is here today for postoperative suture removal appointment.  Recent procedure performed on 10/30/2018: EPF, repair of peroneal and posterior tibial tendon, Kidner advanced tendon right foot.  She states that she is doing well, and continues to nonweightbearing at this time.  Noted well-healing surgical incisions, removed remaining staples.  Wound edges remained aligned approximated and coapted.  There was minimal swelling but within normal limits for postoperative state.  No redness, no erythema, no drainage, no other signs and symptoms of infection.  We discussed partial weightbearing, I demonstrated how to perform these exercises.  Advised her to remain in her boot at all times, she is allowed to get her foot wet, but otherwise she is to remain in her boot.  Follow-up at regular scheduled visit with Dr. Milinda Pointer or sooner with any acute symptom changes.

## 2018-12-22 ENCOUNTER — Other Ambulatory Visit: Payer: Self-pay

## 2018-12-22 ENCOUNTER — Telehealth (INDEPENDENT_AMBULATORY_CARE_PROVIDER_SITE_OTHER): Payer: BC Managed Care – PPO | Admitting: Bariatrics

## 2018-12-22 ENCOUNTER — Encounter (INDEPENDENT_AMBULATORY_CARE_PROVIDER_SITE_OTHER): Payer: Self-pay | Admitting: Bariatrics

## 2018-12-22 DIAGNOSIS — E559 Vitamin D deficiency, unspecified: Secondary | ICD-10-CM

## 2018-12-22 DIAGNOSIS — Z6841 Body Mass Index (BMI) 40.0 and over, adult: Secondary | ICD-10-CM

## 2018-12-22 DIAGNOSIS — R7303 Prediabetes: Secondary | ICD-10-CM

## 2018-12-22 MED ORDER — VITAMIN D (ERGOCALCIFEROL) 1.25 MG (50000 UNIT) PO CAPS: 50000.0000 [IU] | ORAL_CAPSULE | ORAL | 0 refills | Status: DC

## 2018-12-23 NOTE — Progress Notes (Signed)
Office: [phone]  /  Fax: [phone] TeleHealth Visit:  Ruth [name] has verbally consented to this TeleHealth visit today. The patient is located at home, the provider is located at the [employer] [facility] office. The participants in this visit include the listed provider and patient. The visit was conducted today via webex.  HPI:   Chief Complaint: OBESITY Ruth [name] is here to discuss her progress with her obesity treatment plan. She is on the Category 3 plan and is following her eating plan approximately 50 % of the time. She states she is exercising 0 minutes 0 times per week. Ruth [name] is unsure if she has gained or lost weight. She states that she has not weighed herself in a while. She is eating more starchy foods.  We were unable to weigh the patient today for this TeleHealth visit. She is unsure if she has lost or gained weight since her last visit. She has lost 2 lbs since starting treatment with [place].  Vitamin D Deficiency Ruth [name] has a diagnosis of vitamin D deficiency. She is tolerating prescription Vit D well and notes increased energy. Last Vit D level was 28.0. She denies nausea, vomiting or muscle weakness.  Pre-Diabetes Ruth [name] has a diagnosis of pre-diabetes based on her elevated Hgb A1c and was informed this puts her at greater risk of developing diabetes. She is taking metformin currently and continues to work on diet and exercise to decrease risk of diabetes. She denies nausea or hypoglycemia.  ASSESSMENT AND PLAN:  Vitamin D deficiency - Plan: Vitamin D, Ergocalciferol, (DRISDOL) 1.25 MG (50000 UT) CAPS capsule  Prediabetes  Class 3 severe obesity with serious comorbidity and body mass index (BMI) of 50.0 to 59.9 in adult, unspecified obesity type ([place])  PLAN:  Vitamin D Deficiency Ruth [name] was informed that low vitamin D levels contributes to fatigue and are associated with obesity, breast, and colon cancer. Ruth [name] agrees to continue taking prescription Vit D  50,000 IU every week #4 and we will refill for 1 month. She will follow up for routine testing of vitamin D, at least 2-3 times per year. She was informed of the risk of over-replacement of vitamin D and agrees to not increase her dose unless she discusses this with [place] first. Ruth [name] agrees to follow up with our clinic in 2 to 3 weeks.  Pre-Diabetes Ruth [name] will continue to work on weight loss, exercise, and decreasing simple carbohydrates in her diet to help decrease the risk of diabetes. We dicussed metformin including benefits and risks. She was informed that eating too many simple carbohydrates or too many calories at one sitting increases the likelihood of GI side effects. Ruth [name] agrees to increase metformin to 500 mg 1 tablet PO BID. Ruth [name] agrees to follow up with our clinic in 2 to 3 weeks as directed to monitor her progress.  Obesity Ruth [name] is currently in the action stage of change. As such, her goal is to continue with weight loss efforts She has agreed to follow the Category 3 plan Ruth [name] has been instructed to work up to a goal of 150 minutes of combined cardio and strengthening exercise per week for weight loss and overall health benefits. We discussed the following Behavioral Modification Strategies today: increasing lean protein intake, decreasing simple carbohydrates, increasing vegetables, decrease eating out, increase H20 intake, no skipping meals, work on meal planning and easy cooking plans, keeping healthy foods in the home, and planning for success Ruth [name] will weigh herself at home, increase vegetables, and decrease  starchy foods.  Ruth [name] has agreed to follow up with our clinic in 2 to 3 weeks. She was informed of the importance of frequent follow up visits to maximize her success with intensive lifestyle modifications for her multiple health conditions.  ALLERGIES: Allergies  Allergen Reactions  . Lisinopril Swelling  . Tape Other (See Comments)    Certain Band aids cause  skin irritation    MEDICATIONS: Current Outpatient Medications on File Prior to Visit  Medication Sig Dispense Refill  . amLODipine (NORVASC) 5 MG tablet Take 1 tablet (5 mg total) by mouth at bedtime. 90 tablet 1  . atenolol (TENORMIN) 50 MG tablet Take 1 tablet (50 mg total) by mouth daily. 90 tablet 1  . atorvastatin (LIPITOR) 40 MG tablet Take 1 tablet (40 mg total) by mouth daily at 6 PM. 90 tablet 1  . Insulin Pen Needle (CLICKFINE PEN NEEDLES) 32G X 4 MM MISC To use with Victoza 100 each 3  . latanoprost (XALATAN) 0.005 % ophthalmic solution Place 1 drop into both eyes at bedtime.   11  . metFORMIN (GLUCOPHAGE) 500 MG tablet Take 1 tablet (500 mg total) by mouth daily with breakfast. 90 tablet 1  . Multiple Vitamins-Minerals (MULTIVITAMIN WITH MINERALS) tablet Take 1 tablet by mouth daily.    . traMADol (ULTRAM) 50 MG tablet Take by mouth every 6 (six) hours as needed.     No current facility-administered medications on file prior to visit.     PAST MEDICAL HISTORY: Past Medical History:  Diagnosis Date  . Anemia   . Arthritis   . Back pain   . Bilateral calcaneal spurs   . Diabetes mellitus without complication ([place])    type 2  . GERD (gastroesophageal reflux disease)   . Gout   . Hyperlipidemia   . Hypertension   . Obesity   . Osteoarthritis   . Palpitations   . Swallowing difficulty   . Vitamin D deficiency     PAST SURGICAL HISTORY: Past Surgical History:  Procedure Laterality Date  . ABDOMINAL HYSTERECTOMY     partial  . CHOLECYSTECTOMY    . COLONOSCOPY WITH PROPOFOL N/A 10/29/2016   Procedure: COLONOSCOPY WITH PROPOFOL;  Surgeon: Yetta Flock, MD;  Location: [facility];  Service: Gastroenterology;  Laterality: N/A;  . DENTAL SURGERY    . ORIF TIBIA & FIBULA FRACTURES Left     SOCIAL HISTORY: Social History   Tobacco Use  . Smoking status: Former Smoker    Packs/day: 1.00    Years: 20.00    Pack years: 20.00    Quit date: 02/12/2008     Years since quitting: 10.8  . Smokeless tobacco: Never Used  Substance Use Topics  . Alcohol use: No  . Drug use: No    FAMILY HISTORY: Family History  Problem Relation Age of Onset  . Diabetes Mother   . Stroke Mother   . Dementia Mother   . Hypertension Mother   . Eating disorder Mother   . Obesity Mother   . Pancreatic cancer Father   . Sudden death Father   . Alcoholism Father   . Colon polyps Sister   . Kidney disease Sister   . Cancer Maternal Grandmother        type unknown    ROS: Review of Systems  Constitutional: Negative for weight loss.  Gastrointestinal: Negative for nausea and vomiting.  Musculoskeletal:       Negative muscle weakness  Endo/Heme/Allergies:  Negative hypoglycemia    PHYSICAL EXAM: Pt in no acute distress  RECENT LABS AND TESTS: BMET    Component Value Date/Time   NA 143 08/21/2018 1401   NA 140 08/22/2017 0931   K 4.3 08/21/2018 1401   CL 105 08/21/2018 1401   CO2 30 08/21/2018 1401   GLUCOSE 172 (H) 08/21/2018 1401   BUN 14 08/21/2018 1401   BUN 21 08/22/2017 0931   CREATININE 0.84 08/21/2018 1401   CALCIUM 9.8 08/21/2018 1401   GFRNONAA >60 10/30/2017 0712   GFRAA >60 10/30/2017 0712   Lab Results  Component Value Date   HGBA1C 8.3 (H) 08/21/2018   HGBA1C 6.2 (A) 02/03/2018   HGBA1C 6.5 (H) 08/22/2017   HGBA1C 7.6 (H) 08/02/2016   HGBA1C 7.6 07/20/2015   No results found for: INSULIN CBC    Component Value Date/Time   WBC 7.4 10/30/2017 0712   RBC 4.27 10/30/2017 0712   HGB 12.7 10/30/2017 0712   HGB 12.6 08/22/2017 0931   HCT 39.4 10/30/2017 0712   HCT 39.5 08/22/2017 0931   PLT 253 10/30/2017 0712   PLT 275 08/22/2017 0931   MCV 92.3 10/30/2017 0712   MCV 91 08/22/2017 0931   MCH 29.7 10/30/2017 0712   MCHC 32.2 10/30/2017 0712   RDW 14.2 10/30/2017 0712   RDW 14.5 08/22/2017 0931   Iron/TIBC/Ferritin/ %Sat No results found for: IRON, TIBC, FERRITIN, IRONPCTSAT Lipid Panel     Component Value  Date/Time   CHOL 210 (H) 08/21/2018 1401   CHOL 186 08/22/2017 0931   TRIG 95 08/21/2018 1401   HDL 52 08/21/2018 1401   HDL 62 08/22/2017 0931   CHOLHDL 4.0 08/21/2018 1401   VLDL 22.0 08/02/2016 1659   LDLCALC 138 (H) 08/21/2018 1401   Hepatic Function Panel     Component Value Date/Time   PROT 6.5 10/26/2018 1607   ALBUMIN 4.0 10/26/2018 1607   AST 18 10/26/2018 1607   ALT 22 10/26/2018 1607   ALKPHOS 92 10/26/2018 1607   BILITOT 0.3 10/26/2018 1607   BILIDIR 0.11 10/26/2018 1607   IBILI 0.3 08/21/2018 1401      Component Value Date/Time   TSH 1.760 09/07/2018 1222   TSH 1.730 08/22/2017 0931   TSH 1.88 08/02/2016 1659      I, Trixie Dredge, am acting as Location manager for [employer], DO  I have reviewed the above documentation for accuracy and completeness, and I agree with the above. Jearld Lesch, DO

## 2018-12-24 ENCOUNTER — Encounter: Payer: BC Managed Care – PPO | Admitting: Podiatry

## 2018-12-24 ENCOUNTER — Ambulatory Visit (INDEPENDENT_AMBULATORY_CARE_PROVIDER_SITE_OTHER): Payer: BC Managed Care – PPO | Admitting: Podiatry

## 2018-12-24 ENCOUNTER — Other Ambulatory Visit: Payer: Self-pay

## 2018-12-24 ENCOUNTER — Encounter: Payer: Self-pay | Admitting: Podiatry

## 2018-12-24 DIAGNOSIS — M76821 Posterior tibial tendinitis, right leg: Secondary | ICD-10-CM | POA: Diagnosis not present

## 2018-12-24 DIAGNOSIS — Z09 Encounter for follow-up examination after completed treatment for conditions other than malignant neoplasm: Secondary | ICD-10-CM

## 2018-12-25 ENCOUNTER — Telehealth: Payer: Self-pay | Admitting: *Deleted

## 2018-12-25 DIAGNOSIS — M76821 Posterior tibial tendinitis, right leg: Secondary | ICD-10-CM

## 2018-12-25 DIAGNOSIS — Z09 Encounter for follow-up examination after completed treatment for conditions other than malignant neoplasm: Secondary | ICD-10-CM

## 2018-12-25 NOTE — Telephone Encounter (Signed)
Patient called back stating that she waiting to hear from Dr. Milinda Pointer concerning extending [address].I informed her that message was sent to him and we are waiting on his response. She verbally understood.

## 2018-12-25 NOTE — Telephone Encounter (Signed)
Pt states she is till having pain and swelling in the surgery foot and has to elevate, her FMLA is running out and she needs to speak to someone about extending. I informed pt I would send a message to Dr. Milinda Pointer and the [facility] Coordinator concerning the extension.

## 2018-12-28 NOTE — Telephone Encounter (Signed)
Pt states she needs to know when she can return to work.

## 2018-12-28 NOTE — Progress Notes (Signed)
Subjective:   Patient ID: Ruth [name], female   DOB: 58 y.o.   MRN: [id number]   HPI Patient states continuing to improve with the right ankle but still has swelling and I would like to be [place] to start to bear weight on it if possible with patient wearing crutches currently 6 weeks postoperatively   ROS      Objective:  Physical Exam  Neurovascular status is found to be intact negative Bevelyn Buckles' sign was noted with well-healed surgical site right medial lateral ankle with wound edges well coapted mild edema consistent with this.  Postop     Assessment:  Overall doing well after having posterior tib repair peroneal tendon repair     Plan:  H&P reviewed condition and recommended gradual increase in activity levels with slow weightbearing with boot.  Continue compression elevation and reappoint in the next 3 to 4 weeks or earlier if any issues were to occur

## 2018-12-29 NOTE — Addendum Note (Signed)
Addended by: Harriett Sine D on: 12/29/2018 01:10 PM   Modules accepted: Orders

## 2018-12-29 NOTE — Telephone Encounter (Signed)
She needs to be out atleast another month and get her started on PT

## 2018-12-29 NOTE — Telephone Encounter (Signed)
I informed pt of Dr. Stephenie Acres orders of 12/29/2018 6:58am. Hand delivered [employer] PT rx.

## 2018-12-30 DIAGNOSIS — M6281 Muscle weakness (generalized): Secondary | ICD-10-CM | POA: Diagnosis not present

## 2018-12-30 DIAGNOSIS — M25571 Pain in right ankle and joints of right foot: Secondary | ICD-10-CM | POA: Diagnosis not present

## 2018-12-30 DIAGNOSIS — M25471 Effusion, right ankle: Secondary | ICD-10-CM | POA: Diagnosis not present

## 2018-12-30 DIAGNOSIS — M25671 Stiffness of right ankle, not elsewhere classified: Secondary | ICD-10-CM | POA: Diagnosis not present

## 2018-12-30 NOTE — Telephone Encounter (Signed)
I informed pt the scanner was broken and I could send to her employer a different way. Pt states she will be at PT today and can pick up the letter and have an alternate way to get sent to her employer.

## 2018-12-30 NOTE — Telephone Encounter (Signed)
Pt called with [employer] fax [phone]. Faxed letter of 12/30/2018 to [employer].

## 2018-12-30 NOTE — Telephone Encounter (Signed)
I informed pt that I could write a letter extending her out of work until reevaluated 01/21/2019, and email to her or fax to requested number. Pt agreed to terms of the extension and request fax to her email. Faxed letter to [email or web address].

## 2018-12-30 NOTE — Telephone Encounter (Signed)
Unable to scan out of work letter of 12/30/2018 due to our scanner is broken.

## 2018-12-30 NOTE — Telephone Encounter (Signed)
Pt called states she needs a doctor's note, to continue to be out of work because they have her scheduled to return 01/04/2019.

## 2018-12-31 NOTE — Telephone Encounter (Signed)
Pt states she can't get anyone to complete the paperwork and she needs it before Monday and she needs this to keep her job, it needs to be sent to [employer], and wants to speak to Dr. Milinda Pointer to see if he could get the paperwork done.

## 2019-01-11 DIAGNOSIS — M6281 Muscle weakness (generalized): Secondary | ICD-10-CM | POA: Diagnosis not present

## 2019-01-11 DIAGNOSIS — M25471 Effusion, right ankle: Secondary | ICD-10-CM | POA: Diagnosis not present

## 2019-01-11 DIAGNOSIS — M25671 Stiffness of right ankle, not elsewhere classified: Secondary | ICD-10-CM | POA: Diagnosis not present

## 2019-01-11 DIAGNOSIS — M25571 Pain in right ankle and joints of right foot: Secondary | ICD-10-CM | POA: Diagnosis not present

## 2019-01-12 ENCOUNTER — Encounter (INDEPENDENT_AMBULATORY_CARE_PROVIDER_SITE_OTHER): Payer: Self-pay | Admitting: Bariatrics

## 2019-01-12 ENCOUNTER — Other Ambulatory Visit: Payer: Self-pay

## 2019-01-12 ENCOUNTER — Ambulatory Visit (INDEPENDENT_AMBULATORY_CARE_PROVIDER_SITE_OTHER): Payer: BC Managed Care – PPO | Admitting: Bariatrics

## 2019-01-12 DIAGNOSIS — E119 Type 2 diabetes mellitus without complications: Secondary | ICD-10-CM | POA: Diagnosis not present

## 2019-01-12 DIAGNOSIS — E7849 Other hyperlipidemia: Secondary | ICD-10-CM

## 2019-01-12 DIAGNOSIS — Z6841 Body Mass Index (BMI) 40.0 and over, adult: Secondary | ICD-10-CM

## 2019-01-12 DIAGNOSIS — E559 Vitamin D deficiency, unspecified: Secondary | ICD-10-CM

## 2019-01-12 MED ORDER — VITAMIN D (ERGOCALCIFEROL) 1.25 MG (50000 UNIT) PO CAPS: 50000.0000 [IU] | ORAL_CAPSULE | ORAL | 0 refills | Status: DC

## 2019-01-12 NOTE — Progress Notes (Signed)
Office: [phone]  /  Fax: [phone] TeleHealth Visit:  Ruth [name] has verbally consented to this TeleHealth visit today. The patient is located at home, the provider is located at the [employer] [facility] office. The participants in this visit include the listed provider and patient. The visit was conducted today via Webex.  HPI:   Chief Complaint: OBESITY Ruth [name] is here to discuss her progress with her obesity treatment plan. She is on the Category 3 plan and is following her eating plan approximately 50% of the time. She states she is exercising 0 minutes 0 times per week. [place] is unsure if she has lost or gained weight as she has not weighed. She reports doing well with her water intake and states she is not doing any excessive snacking. We were unable to weigh the patient today for this TeleHealth visit. She is unsure if she has lost or gained weight since her last visit. She has lost 2 lbs since starting treatment with [place].  Hyperlipidemia Ruth [name] has hyperlipidemia and has been trying to improve her cholesterol levels with intensive lifestyle modification including a low saturated fat diet, exercise and weight loss. She is taking Lipitor denies any myalgias.  Vitamin D deficiency Ruth [name] has a diagnosis of Vitamin D deficiency. She is currently taking prescription Vit D and denies nausea, vomiting or muscle weakness. She reports no or minimal exposure to the sun.  Diabetes II Ruth [name] has a diagnosis of diabetes type II and is taking metformin. Ruth [name] states she is not checking her blood sugars and denies any symptoms of low blood sugars. Last A1c was 8.3 on 08/21/2018. She has been working on intensive lifestyle modifications including diet, exercise, and weight loss to help control her blood glucose levels.  ASSESSMENT AND PLAN:  Other hyperlipidemia  Vitamin D deficiency - Plan: Vitamin D, Ergocalciferol, (DRISDOL) 1.25 MG (50000 UT) CAPS capsule  Type 2 diabetes  mellitus without complication, without long-term current use of insulin ([facility])  Class 3 severe obesity due to excess calories with serious comorbidity and body mass index (BMI) of 50.0 to 59.9 in adult [facility])  PLAN:  Hyperlipidemia Ruth [name] was informed of the American Heart Association Guidelines emphasizing intensive lifestyle modifications as the first line treatment for hyperlipidemia. We discussed many lifestyle modifications today in depth, and Ruth [name] will continue to work on decreasing saturated fats such as fatty red meat, butter and many fried foods. Ruth [name] will continue her medications. She will also increase vegetables and lean protein in her diet and continue to work on exercise and weight loss efforts.  Vitamin D Deficiency Ruth [name] was informed that low Vitamin D levels contributes to fatigue and are associated with obesity, breast, and colon cancer. She agrees to continue to take prescription Vit D @ 50,000 IU every week #4 with 0 refills and will follow-up for routine testing of Vitamin D, at least 2-3 times per year. She was informed of the risk of over-replacement of Vitamin D and agrees to not increase her dose unless she discusses this with [place] first. Ruth [name] agrees to follow-up with our clinic in 2-3 weeks.  Diabetes II Ruth [name] has been given diabetes education by myself including ideal fasting and post-prandial blood glucose readings, individual ideal HgA1c goals  and hypoglycemia prevention. We discussed the importance of good blood sugar control to decrease the likelihood of diabetic complications such as nephropathy, neuropathy, limb loss, blindness, coronary artery disease, and death. We discussed the importance of intensive lifestyle modification including diet, exercise  and weight loss as the first line treatment for diabetes. Ruth [name] agrees to continue her diabetes medications and will follow-up at the agreed upon time.  Obesity Ruth [name] is currently in the action stage of change.  As such, her goal is to continue with weight loss efforts. She has agreed to follow the Category 3 plan. Ruth [name] will weigh herself at home. She will work on meal planning, increasing protein and vegetables. Ruth [name] has been instructed to increase activity and moving more for weight loss and overall health benefits. She has started PT. We discussed the following Behavioral Modification Strategies today: increasing lean protein intake, decreasing simple carbohydrates, increasing vegetables, increase H20 intake, decrease eating out, no skipping meals, work on meal planning and easy cooking plans, keeping healthy foods in the home, and planning for success.  Ruth [name] has agreed to follow-up with our clinic in 2-3 weeks. She was informed of the importance of frequent follow-up visits to maximize her success with intensive lifestyle modifications for her multiple health conditions.  ALLERGIES: Allergies  Allergen Reactions  . Lisinopril Swelling  . Tape Other (See Comments)    Certain Band aids cause skin irritation    MEDICATIONS: Current Outpatient Medications on File Prior to Visit  Medication Sig Dispense Refill  . amLODipine (NORVASC) 5 MG tablet Take 1 tablet (5 mg total) by mouth at bedtime. 90 tablet 1  . atenolol (TENORMIN) 50 MG tablet Take 1 tablet (50 mg total) by mouth daily. 90 tablet 1  . atorvastatin (LIPITOR) 40 MG tablet Take 1 tablet (40 mg total) by mouth daily at 6 PM. 90 tablet 1  . Insulin Pen Needle (CLICKFINE PEN NEEDLES) 32G X 4 MM MISC To use with Victoza 100 each 3  . latanoprost (XALATAN) 0.005 % ophthalmic solution Place 1 drop into both eyes at bedtime.   11  . metFORMIN (GLUCOPHAGE) 500 MG tablet Take 1 tablet (500 mg total) by mouth daily with breakfast. 90 tablet 1  . Multiple Vitamins-Minerals (MULTIVITAMIN WITH MINERALS) tablet Take 1 tablet by mouth daily.    . traMADol (ULTRAM) 50 MG tablet Take by mouth every 6 (six) hours as needed.     No current  facility-administered medications on file prior to visit.     PAST MEDICAL HISTORY: Past Medical History:  Diagnosis Date  . Anemia   . Arthritis   . Back pain   . Bilateral calcaneal spurs   . Diabetes mellitus without complication ([place])    type 2  . GERD (gastroesophageal reflux disease)   . Gout   . Hyperlipidemia   . Hypertension   . Obesity   . Osteoarthritis   . Palpitations   . Swallowing difficulty   . Vitamin D deficiency     PAST SURGICAL HISTORY: Past Surgical History:  Procedure Laterality Date  . ABDOMINAL HYSTERECTOMY     partial  . CHOLECYSTECTOMY    . COLONOSCOPY WITH PROPOFOL N/A 10/29/2016   Procedure: COLONOSCOPY WITH PROPOFOL;  Surgeon: Yetta Flock, MD;  Location: [facility];  Service: Gastroenterology;  Laterality: N/A;  . DENTAL SURGERY    . ORIF TIBIA & FIBULA FRACTURES Left     SOCIAL HISTORY: Social History   Tobacco Use  . Smoking status: Former Smoker    Packs/day: 1.00    Years: 20.00    Pack years: 20.00    Quit date: 02/12/2008    Years since quitting: 10.9  . Smokeless tobacco: Never Used  Substance Use Topics  . Alcohol use: No  .  Drug use: No    FAMILY HISTORY: Family History  Problem Relation Age of Onset  . Diabetes Mother   . Stroke Mother   . Dementia Mother   . Hypertension Mother   . Eating disorder Mother   . Obesity Mother   . Pancreatic cancer Father   . Sudden death Father   . Alcoholism Father   . Colon polyps Sister   . Kidney disease Sister   . Cancer Maternal Grandmother        type unknown   ROS: Review of Systems  Gastrointestinal: Negative for nausea and vomiting.  Musculoskeletal: Negative for myalgias.       Negative for muscle weakness.  Endo/Heme/Allergies:       Negative for symptoms of hypoglycemia.   PHYSICAL EXAM: Pt in no acute distress  RECENT LABS AND TESTS: BMET    Component Value Date/Time   NA 143 08/21/2018 1401   NA 140 08/22/2017 0931   K 4.3 08/21/2018 1401    CL 105 08/21/2018 1401   CO2 30 08/21/2018 1401   GLUCOSE 172 (H) 08/21/2018 1401   BUN 14 08/21/2018 1401   BUN 21 08/22/2017 0931   CREATININE 0.84 08/21/2018 1401   CALCIUM 9.8 08/21/2018 1401   GFRNONAA >60 10/30/2017 0712   GFRAA >60 10/30/2017 0712   Lab Results  Component Value Date   HGBA1C 8.3 (H) 08/21/2018   HGBA1C 6.2 (A) 02/03/2018   HGBA1C 6.5 (H) 08/22/2017   HGBA1C 7.6 (H) 08/02/2016   HGBA1C 7.6 07/20/2015   No results found for: INSULIN CBC    Component Value Date/Time   WBC 7.4 10/30/2017 0712   RBC 4.27 10/30/2017 0712   HGB 12.7 10/30/2017 0712   HGB 12.6 08/22/2017 0931   HCT 39.4 10/30/2017 0712   HCT 39.5 08/22/2017 0931   PLT 253 10/30/2017 0712   PLT 275 08/22/2017 0931   MCV 92.3 10/30/2017 0712   MCV 91 08/22/2017 0931   MCH 29.7 10/30/2017 0712   MCHC 32.2 10/30/2017 0712   RDW 14.2 10/30/2017 0712   RDW 14.5 08/22/2017 0931   Iron/TIBC/Ferritin/ %Sat No results found for: IRON, TIBC, FERRITIN, IRONPCTSAT Lipid Panel     Component Value Date/Time   CHOL 210 (H) 08/21/2018 1401   CHOL 186 08/22/2017 0931   TRIG 95 08/21/2018 1401   HDL 52 08/21/2018 1401   HDL 62 08/22/2017 0931   CHOLHDL 4.0 08/21/2018 1401   VLDL 22.0 08/02/2016 1659   LDLCALC 138 (H) 08/21/2018 1401   Hepatic Function Panel     Component Value Date/Time   PROT 6.5 10/26/2018 1607   ALBUMIN 4.0 10/26/2018 1607   AST 18 10/26/2018 1607   ALT 22 10/26/2018 1607   ALKPHOS 92 10/26/2018 1607   BILITOT 0.3 10/26/2018 1607   BILIDIR 0.11 10/26/2018 1607   IBILI 0.3 08/21/2018 1401      Component Value Date/Time   TSH 1.760 09/07/2018 1222   TSH 1.730 08/22/2017 0931   TSH 1.88 08/02/2016 1659   Results for MERA, ZUBEK (MRN [id number]) as of 01/12/2019 11:03  Ref. Range 09/07/2018 12:22  Vitamin D, 25-Hydroxy Latest Ref Range: 30.0 - 100.0 ng/mL 24.9 (L)   I, Michaelene Song, am acting as Location manager for [employer], DO  I have reviewed the above  documentation for accuracy and completeness, and I agree with the above. -Jearld Lesch, DO

## 2019-01-13 DIAGNOSIS — M25571 Pain in right ankle and joints of right foot: Secondary | ICD-10-CM | POA: Diagnosis not present

## 2019-01-13 DIAGNOSIS — M25471 Effusion, right ankle: Secondary | ICD-10-CM | POA: Diagnosis not present

## 2019-01-13 DIAGNOSIS — M6281 Muscle weakness (generalized): Secondary | ICD-10-CM | POA: Diagnosis not present

## 2019-01-13 DIAGNOSIS — M25671 Stiffness of right ankle, not elsewhere classified: Secondary | ICD-10-CM | POA: Diagnosis not present

## 2019-01-18 DIAGNOSIS — M25471 Effusion, right ankle: Secondary | ICD-10-CM | POA: Diagnosis not present

## 2019-01-18 DIAGNOSIS — M25671 Stiffness of right ankle, not elsewhere classified: Secondary | ICD-10-CM | POA: Diagnosis not present

## 2019-01-18 DIAGNOSIS — M25571 Pain in right ankle and joints of right foot: Secondary | ICD-10-CM | POA: Diagnosis not present

## 2019-01-18 DIAGNOSIS — M6281 Muscle weakness (generalized): Secondary | ICD-10-CM | POA: Diagnosis not present

## 2019-01-20 DIAGNOSIS — M25571 Pain in right ankle and joints of right foot: Secondary | ICD-10-CM | POA: Diagnosis not present

## 2019-01-20 DIAGNOSIS — M25671 Stiffness of right ankle, not elsewhere classified: Secondary | ICD-10-CM | POA: Diagnosis not present

## 2019-01-20 DIAGNOSIS — M25471 Effusion, right ankle: Secondary | ICD-10-CM | POA: Diagnosis not present

## 2019-01-20 DIAGNOSIS — M6281 Muscle weakness (generalized): Secondary | ICD-10-CM | POA: Diagnosis not present

## 2019-01-21 ENCOUNTER — Ambulatory Visit (INDEPENDENT_AMBULATORY_CARE_PROVIDER_SITE_OTHER): Payer: Self-pay | Admitting: Podiatry

## 2019-01-21 ENCOUNTER — Other Ambulatory Visit: Payer: Self-pay

## 2019-01-21 ENCOUNTER — Telehealth: Payer: Self-pay | Admitting: Sports Medicine

## 2019-01-21 ENCOUNTER — Encounter: Payer: Self-pay | Admitting: Podiatry

## 2019-01-21 DIAGNOSIS — Z09 Encounter for follow-up examination after completed treatment for conditions other than malignant neoplasm: Secondary | ICD-10-CM

## 2019-01-21 DIAGNOSIS — M76821 Posterior tibial tendinitis, right leg: Secondary | ICD-10-CM

## 2019-01-21 NOTE — Telephone Encounter (Signed)
Entered in error

## 2019-01-21 NOTE — Progress Notes (Signed)
She presents today date of surgery is 10/30/2018 status post EPF repair of peroneal tendon repair to posterior tibial tendon with a Kidner advanced tendon repair in the cast.  She states that it hurts on the inside everything else seems to be doing pretty well.  She has a lot of swelling still continues to go to physical therapy daily she was placed in a flat Darco shoe last visit..  Objective: Vital signs are stable alert and oriented x3.  As she has edema to the medial aspect of the foot with tender inversion against resistance.  Otherwise the other incision sites have gone on to heal uneventfully are not and are nontender.  Assessment: Posterior tibial tendon is still healing with mild to moderate edema.  Plan: Continue physical therapy I will her in a tennis shoe rather than a flat Darco shoe I expressed to her that the tissue did not work she was to be in her cam walker until I can follow-up with her at which time we may place her in a Tri-Lock brace and then go to a tennis shoe.  She will be out of work for at least another 2 months and I will follow-up with her in 1 month she will continue physical therapy.

## 2019-01-25 DIAGNOSIS — M25671 Stiffness of right ankle, not elsewhere classified: Secondary | ICD-10-CM | POA: Diagnosis not present

## 2019-01-25 DIAGNOSIS — M6281 Muscle weakness (generalized): Secondary | ICD-10-CM | POA: Diagnosis not present

## 2019-01-25 DIAGNOSIS — M25571 Pain in right ankle and joints of right foot: Secondary | ICD-10-CM | POA: Diagnosis not present

## 2019-01-25 DIAGNOSIS — M25471 Effusion, right ankle: Secondary | ICD-10-CM | POA: Diagnosis not present

## 2019-01-26 ENCOUNTER — Encounter (INDEPENDENT_AMBULATORY_CARE_PROVIDER_SITE_OTHER): Payer: Self-pay | Admitting: Bariatrics

## 2019-01-26 ENCOUNTER — Other Ambulatory Visit: Payer: Self-pay

## 2019-01-26 ENCOUNTER — Telehealth (INDEPENDENT_AMBULATORY_CARE_PROVIDER_SITE_OTHER): Payer: BC Managed Care – PPO | Admitting: Bariatrics

## 2019-01-26 DIAGNOSIS — E7849 Other hyperlipidemia: Secondary | ICD-10-CM | POA: Diagnosis not present

## 2019-01-26 DIAGNOSIS — I10 Essential (primary) hypertension: Secondary | ICD-10-CM | POA: Diagnosis not present

## 2019-01-26 DIAGNOSIS — E559 Vitamin D deficiency, unspecified: Secondary | ICD-10-CM | POA: Diagnosis not present

## 2019-01-26 DIAGNOSIS — Z6841 Body Mass Index (BMI) 40.0 and over, adult: Secondary | ICD-10-CM

## 2019-01-26 MED ORDER — VITAMIN D (ERGOCALCIFEROL) 1.25 MG (50000 UNIT) PO CAPS: 50000.0000 [IU] | ORAL_CAPSULE | ORAL | 0 refills | Status: DC

## 2019-01-27 NOTE — Progress Notes (Signed)
Office: [phone]  /  Fax: [phone] TeleHealth Visit:  Ruth [name] has verbally consented to this TeleHealth visit today. The patient is located at home, the provider is located at the [facility] office. The participants in this visit include the listed provider and patient. The visit was conducted today via Webex.  HPI:  Chief Complaint: OBESITY Ruth [name] is here to discuss her progress with her obesity treatment plan. She is on the Category 3 plan and states she is following her eating plan approximately 50% of the time. She states she is exercising 0 minutes 0 times per week.  Ruth [name] states that her weight remains the same. She is doing well with her foods. She does state she has struggled with portion size and some sweets.  Today's visit was #8 Starting weight: 336 lbs Starting date: 09/07/2018   Hyperlipidemia Amare has a diagnosis of hyperlipidemia and is taking Lipitor. She denies myalgias.  Hypertension Ruth [name] has hypertension, which is controlled, and is taking Norvasc and atenolol. She states she has a wrist cuff but needs an arm cuff.  Vitamin D deficiency Ruth [name] has a diagnosis of Vitamin D deficiency and is taking prescription Vitamin D. She denies nausea, vomiting, or muscle weakness.  ASSESSMENT AND PLAN:  Other hyperlipidemia  HTN (hypertension), benign  Vitamin D deficiency - Plan: Vitamin D, Ergocalciferol, (DRISDOL) 1.25 MG (50000 UT) CAPS capsule  Morbid obesity with BMI of 50.0-59.9, adult ([place])  PLAN:  Hyperlipidemia Intensive lifestyle modifications as the first line treatment for hyperlipidemia. We discussed many lifestyle modifications today and Ruth [name] will continue her medications, continue to work on diet, exercise and weight loss efforts.  Hypertension Ruth [name] is working on healthy weight loss and exercise to improve blood pressure control. She will continue her medications and will check blood pressure periodically. We will  watch for signs of hypotension as she continues her lifestyle modifications.  Vitamin D Deficiency Ruth [name] was informed that low Vitamin D levels contributes to fatigue and are associated with obesity, breast, and colon cancer. She agrees to continue to take prescription Vit D @ 50,000 IU every week #4 with 0 refills and will follow-up for routine testing of Vitamin D, at least 2-3 times per year. She was informed of the risk of over-replacement of Vitamin D and agrees to not increase her dose unless she discusses this with [place] first. Ruth [name] agrees to follow-up with our clinic in 3 weeks.  Obesity Ruth [name] is currently in the action stage of change. As such, her goal is to continue with weight loss efforts. She has agreed to follow the Category 3 plan. Ruth [name] will work on meal planning, decreasing sweets, increasing her water intake, increasing vegetables, and increase adherence to the plan. Ruth [name] is going to Physical Therapy. We discussed the following Behavioral Modification Strategies today: increasing lean protein intake, decreasing simple carbohydrates, increasing vegetables, increase H20 intake, decrease eating out, no skipping meals, work on meal planning and easy cooking plans, keeping healthy foods in the home, and planning for success.  Ruth [name] has agreed to follow-up with our clinic in 3 weeks. She was informed of the importance of frequent follow-up visits to maximize her success with intensive lifestyle modifications for her multiple health conditions.  ALLERGIES: Allergies  Allergen Reactions  . Lisinopril Swelling  . Tape Other (See Comments)    Certain Band aids cause skin irritation    MEDICATIONS: Current Outpatient Medications on File Prior to Visit  Medication Sig Dispense Refill  . amLODipine (NORVASC)  5 MG tablet Take 1 tablet (5 mg total) by mouth at bedtime. 90 tablet 1  . atenolol (TENORMIN) 50 MG tablet Take 1 tablet (50 mg total) by mouth daily. 90 tablet 1  .  atorvastatin (LIPITOR) 40 MG tablet Take 1 tablet (40 mg total) by mouth daily at 6 PM. 90 tablet 1  . Insulin Pen Needle (CLICKFINE PEN NEEDLES) 32G X 4 MM MISC To use with Victoza 100 each 3  . latanoprost (XALATAN) 0.005 % ophthalmic solution Place 1 drop into both eyes at bedtime.   11  . metFORMIN (GLUCOPHAGE) 500 MG tablet Take 1 tablet (500 mg total) by mouth daily with breakfast. 90 tablet 1  . Multiple Vitamins-Minerals (MULTIVITAMIN WITH MINERALS) tablet Take 1 tablet by mouth daily.    . traMADol (ULTRAM) 50 MG tablet Take by mouth every 6 (six) hours as needed.     No current facility-administered medications on file prior to visit.    PAST MEDICAL HISTORY: Past Medical History:  Diagnosis Date  . Anemia   . Arthritis   . Back pain   . Bilateral calcaneal spurs   . Diabetes mellitus without complication ([place])    type 2  . GERD (gastroesophageal reflux disease)   . Gout   . Hyperlipidemia   . Hypertension   . Obesity   . Osteoarthritis   . Palpitations   . Swallowing difficulty   . Vitamin D deficiency     PAST SURGICAL HISTORY: Past Surgical History:  Procedure Laterality Date  . ABDOMINAL HYSTERECTOMY     partial  . CHOLECYSTECTOMY    . COLONOSCOPY WITH PROPOFOL N/A 10/29/2016   Procedure: COLONOSCOPY WITH PROPOFOL;  Surgeon: Yetta Flock, MD;  Location: [facility];  Service: Gastroenterology;  Laterality: N/A;  . DENTAL SURGERY    . ORIF TIBIA & FIBULA FRACTURES Left     SOCIAL HISTORY: Social History   Tobacco Use  . Smoking status: Former Smoker    Packs/day: 1.00    Years: 20.00    Pack years: 20.00    Quit date: 02/12/2008    Years since quitting: 10.9  . Smokeless tobacco: Never Used  Substance Use Topics  . Alcohol use: No  . Drug use: No    FAMILY HISTORY: Family History  Problem Relation Age of Onset  . Diabetes Mother   . Stroke Mother   . Dementia Mother   . Hypertension Mother   . Eating disorder Mother   . Obesity  Mother   . Pancreatic cancer Father   . Sudden death Father   . Alcoholism Father   . Colon polyps Sister   . Kidney disease Sister   . Cancer Maternal Grandmother        type unknown   ROS: Review of Systems  Gastrointestinal: Negative for nausea and vomiting.  Musculoskeletal: Negative for myalgias.       Negative for muscle weakness.   PHYSICAL EXAM: There were no vitals taken for this visit. There is no height or weight on file to calculate BMI. Physical Exam: Pt in no acute distress.  RECENT LABS AND TESTS: BMET    Component Value Date/Time   NA 143 08/21/2018 1401   NA 140 08/22/2017 0931   K 4.3 08/21/2018 1401   CL 105 08/21/2018 1401   CO2 30 08/21/2018 1401   GLUCOSE 172 (H) 08/21/2018 1401   BUN 14 08/21/2018 1401   BUN 21 08/22/2017 0931   CREATININE 0.84 08/21/2018 1401  CALCIUM 9.8 08/21/2018 1401   GFRNONAA >60 10/30/2017 0712   GFRAA >60 10/30/2017 0712   Lab Results  Component Value Date   HGBA1C 8.3 (H) 08/21/2018   HGBA1C 6.2 (A) 02/03/2018   HGBA1C 6.5 (H) 08/22/2017   HGBA1C 7.6 (H) 08/02/2016   HGBA1C 7.6 07/20/2015   No results found for: INSULIN CBC    Component Value Date/Time   WBC 7.4 10/30/2017 0712   RBC 4.27 10/30/2017 0712   HGB 12.7 10/30/2017 0712   HGB 12.6 08/22/2017 0931   HCT 39.4 10/30/2017 0712   HCT 39.5 08/22/2017 0931   PLT 253 10/30/2017 0712   PLT 275 08/22/2017 0931   MCV 92.3 10/30/2017 0712   MCV 91 08/22/2017 0931   MCH 29.7 10/30/2017 0712   MCHC 32.2 10/30/2017 0712   RDW 14.2 10/30/2017 0712   RDW 14.5 08/22/2017 0931   Iron/TIBC/Ferritin/ %Sat No results found for: IRON, TIBC, FERRITIN, IRONPCTSAT Lipid Panel     Component Value Date/Time   CHOL 210 (H) 08/21/2018 1401   CHOL 186 08/22/2017 0931   TRIG 95 08/21/2018 1401   HDL 52 08/21/2018 1401   HDL 62 08/22/2017 0931   CHOLHDL 4.0 08/21/2018 1401   VLDL 22.0 08/02/2016 1659   LDLCALC 138 (H) 08/21/2018 1401   Hepatic Function Panel      Component Value Date/Time   PROT 6.5 10/26/2018 1607   ALBUMIN 4.0 10/26/2018 1607   AST 18 10/26/2018 1607   ALT 22 10/26/2018 1607   ALKPHOS 92 10/26/2018 1607   BILITOT 0.3 10/26/2018 1607   BILIDIR 0.11 10/26/2018 1607   IBILI 0.3 08/21/2018 1401      Component Value Date/Time   TSH 1.760 09/07/2018 1222   TSH 1.730 08/22/2017 0931   TSH 1.88 08/02/2016 1659    OBESITY BEHAVIORAL INTERVENTION VISIT DOCUMENTATION FOR INSURANCE (~15 minutes)  I, Michaelene Song, am acting as Location manager for [employer], DO  I have reviewed the above documentation for accuracy and completeness, and I agree with the above. Jearld Lesch, DO

## 2019-02-01 DIAGNOSIS — M25571 Pain in right ankle and joints of right foot: Secondary | ICD-10-CM | POA: Diagnosis not present

## 2019-02-01 DIAGNOSIS — M6281 Muscle weakness (generalized): Secondary | ICD-10-CM | POA: Diagnosis not present

## 2019-02-01 DIAGNOSIS — M25671 Stiffness of right ankle, not elsewhere classified: Secondary | ICD-10-CM | POA: Diagnosis not present

## 2019-02-01 DIAGNOSIS — M25471 Effusion, right ankle: Secondary | ICD-10-CM | POA: Diagnosis not present

## 2019-02-03 DIAGNOSIS — M25471 Effusion, right ankle: Secondary | ICD-10-CM | POA: Diagnosis not present

## 2019-02-03 DIAGNOSIS — M25671 Stiffness of right ankle, not elsewhere classified: Secondary | ICD-10-CM | POA: Diagnosis not present

## 2019-02-03 DIAGNOSIS — M25571 Pain in right ankle and joints of right foot: Secondary | ICD-10-CM | POA: Diagnosis not present

## 2019-02-03 DIAGNOSIS — M6281 Muscle weakness (generalized): Secondary | ICD-10-CM | POA: Diagnosis not present

## 2019-02-08 DIAGNOSIS — M25471 Effusion, right ankle: Secondary | ICD-10-CM | POA: Diagnosis not present

## 2019-02-08 DIAGNOSIS — M6281 Muscle weakness (generalized): Secondary | ICD-10-CM | POA: Diagnosis not present

## 2019-02-08 DIAGNOSIS — M25571 Pain in right ankle and joints of right foot: Secondary | ICD-10-CM | POA: Diagnosis not present

## 2019-02-08 DIAGNOSIS — M25671 Stiffness of right ankle, not elsewhere classified: Secondary | ICD-10-CM | POA: Diagnosis not present

## 2019-02-09 ENCOUNTER — Other Ambulatory Visit: Payer: Self-pay

## 2019-02-10 ENCOUNTER — Ambulatory Visit: Payer: BC Managed Care – PPO | Admitting: Nurse Practitioner

## 2019-02-10 ENCOUNTER — Other Ambulatory Visit (HOSPITAL_COMMUNITY)
Admission: RE | Admit: 2019-02-10 | Discharge: 2019-02-10 | Disposition: A | Discharge status: Home / Self Care | Payer: BC Managed Care – PPO | Source: Ambulatory Visit | Attending: Nurse Practitioner | Admitting: Nurse Practitioner

## 2019-02-10 ENCOUNTER — Encounter: Payer: Self-pay | Admitting: Nurse Practitioner

## 2019-02-10 VITALS — BP 124/70 | HR 75 | Temp 97.6°F | Ht 64.0 in | Wt 338.0 lb

## 2019-02-10 DIAGNOSIS — M25471 Effusion, right ankle: Secondary | ICD-10-CM | POA: Diagnosis not present

## 2019-02-10 DIAGNOSIS — M25571 Pain in right ankle and joints of right foot: Secondary | ICD-10-CM | POA: Diagnosis not present

## 2019-02-10 DIAGNOSIS — M6281 Muscle weakness (generalized): Secondary | ICD-10-CM | POA: Diagnosis not present

## 2019-02-10 DIAGNOSIS — N760 Acute vaginitis: Secondary | ICD-10-CM

## 2019-02-10 DIAGNOSIS — I10 Essential (primary) hypertension: Secondary | ICD-10-CM

## 2019-02-10 DIAGNOSIS — E1165 Type 2 diabetes mellitus with hyperglycemia: Secondary | ICD-10-CM | POA: Diagnosis not present

## 2019-02-10 DIAGNOSIS — R350 Frequency of micturition: Secondary | ICD-10-CM

## 2019-02-10 DIAGNOSIS — M25671 Stiffness of right ankle, not elsewhere classified: Secondary | ICD-10-CM | POA: Diagnosis not present

## 2019-02-10 MED ORDER — FLUCONAZOLE 150 MG PO TABS: 150.0000 mg | ORAL_TABLET | Freq: Once | ORAL | 0 refills | Status: AC

## 2019-02-10 MED ORDER — METRONIDAZOLE 0.75 % VA GEL: 1.0000 | Freq: Every day | VAGINAL | 0 refills | Status: DC

## 2019-02-10 NOTE — Progress Notes (Signed)
Subjective:  Patient ID: Ruth [name], female    DOB: 08/23/1960  Age: 58 y.o. MRN: [id number]  CC: Vaginal Discharge (thick white substance pt notices on the folds of vagina, no odor little itchy sometimes symptoms x 3 months, also urine incontinence)  Vaginal Discharge The patient's primary symptoms include genital itching and vaginal discharge. The patient's pertinent negatives include no genital lesions, genital odor, genital rash, missed menses, pelvic pain or vaginal bleeding. This is a new problem. The current episode started in the past 7 days. The problem occurs constantly. The problem has been unchanged. The problem affects both sides. She is not pregnant. Associated symptoms include dysuria, frequency, rash and urgency. Pertinent negatives include no anorexia, back pain, chills, constipation, diarrhea, fever, flank pain, hematuria or painful intercourse. The vaginal discharge was milky and thick. There has been no bleeding. Nothing aggravates the symptoms. She uses hysterectomy for contraception. She is postmenopausal. There is no history of herpes simplex, PID, an STD or vaginosis.   Reviewed past Medical, Social and Family history today.  Outpatient Medications Prior to Visit  Medication Sig Dispense Refill  . amLODipine (NORVASC) 5 MG tablet Take 1 tablet (5 mg total) by mouth at bedtime. 90 tablet 1  . atenolol (TENORMIN) 50 MG tablet Take 1 tablet (50 mg total) by mouth daily. 90 tablet 1  . atorvastatin (LIPITOR) 40 MG tablet Take 1 tablet (40 mg total) by mouth daily at 6 PM. 90 tablet 1  . latanoprost (XALATAN) 0.005 % ophthalmic solution Place 1 drop into both eyes at bedtime.   11  . Multiple Vitamins-Minerals (MULTIVITAMIN WITH MINERALS) tablet Take 1 tablet by mouth daily.    . Vitamin D, Ergocalciferol, (DRISDOL) 1.25 MG (50000 UT) CAPS capsule Take 1 capsule (50,000 Units total) by mouth every 7 (seven) days. 4 capsule 0  . metFORMIN (GLUCOPHAGE) 500 MG tablet Take 1  tablet (500 mg total) by mouth daily with breakfast. 90 tablet 1  . traMADol (ULTRAM) 50 MG tablet Take by mouth every 6 (six) hours as needed.    . Insulin Pen Needle (CLICKFINE PEN NEEDLES) 32G X 4 MM MISC To use with Victoza (Patient not taking: Reported on 02/10/2019) 100 each 3   No facility-administered medications prior to visit.    ROS See HPI  Objective:  BP 124/70   Pulse 75   Temp 97.6 F (36.4 C) (Oral)   Ht 5' 4"  (1.626 m)   Wt (!) 338 lb (153.3 kg)   SpO2 97%   BMI 58.02 kg/m   BP Readings from Last 3 Encounters:  02/10/19 124/70  11/12/18 139/81  11/03/18 (!) 151/82   Wt Readings from Last 3 Encounters:  02/10/19 (!) 338 lb (153.3 kg)  10/26/18 (!) 337 lb (152.9 kg)  09/21/18 (!) 334 lb (151.5 kg)   Physical Exam Vitals reviewed. Exam conducted with a chaperone present.  Constitutional:      Appearance: She is obese.  Genitourinary:    Pubic Area: No rash.      Labia:        Right: Rash present.        Left: Rash present.      Vagina: Vaginal discharge present.  Neurological:     Mental Status: She is alert.    Lab Results  Component Value Date   WBC 7.4 10/30/2017   HGB 12.7 10/30/2017   HCT 39.4 10/30/2017   PLT 253 10/30/2017   GLUCOSE WILL FOLLOW 02/10/2019   CHOL 210 (  H) 08/21/2018   TRIG 95 08/21/2018   HDL 52 08/21/2018   LDLCALC 138 (H) 08/21/2018   ALT 22 10/26/2018   AST 18 10/26/2018   NA WILL FOLLOW 02/10/2019   K WILL FOLLOW 02/10/2019   CL WILL FOLLOW 02/10/2019   CREATININE WILL FOLLOW 02/10/2019   BUN WILL FOLLOW 02/10/2019   CO2 WILL FOLLOW 02/10/2019   TSH 1.760 09/07/2018   HGBA1C 11.6 (H) 02/10/2019   MICROALBUR 0.4 08/21/2018   Assessment & Plan:  This visit occurred during the SARS-CoV-2 public health emergency.  Safety protocols were in place, including screening questions prior to the visit, additional usage of staff PPE, and extensive cleaning of exam room while observing appropriate contact time as indicated  for disinfecting solutions.   Cai was seen today for vaginal discharge.  Diagnoses and all orders for this visit:  Acute vaginitis -     Cervicovaginal ancillary only( [place]) -     fluconazole (DIFLUCAN) 150 MG tablet; Take 1 tablet (150 mg total) by mouth once for 1 dose. -     metroNIDAZOLE (METROGEL VAGINAL) 0.75 % vaginal gel; Place 1 Applicatorful vaginally at bedtime.  HTN (hypertension), benign -     Cancel: HTN_1 BMP -     Hemoglobin A1c -     Basic metabolic panel  Type 2 diabetes mellitus with hyperglycemia, without long-term current use of insulin ([facility]) -     Cancel: DM_1 Hemoglobin A1c -     Cancel: HTN_1 BMP -     Hemoglobin A1c -     Basic metabolic panel -     metFORMIN (GLUCOPHAGE) 850 MG tablet; Take 1 tablet (850 mg total) by mouth 2 (two) times daily with a meal. -     Insulin Glargine (LANTUS) 100 UNIT/ML Solostar Pen; Inject 10 Units into the skin at bedtime. -     Insulin Pen Needle (PEN NEEDLES) 31G X 6 MM MISC; 1 application by Does not apply route at bedtime. -     blood glucose meter kit and supplies KIT; Dispense based on patient and insurance preference. Check glucose before breakfast and at bedtime. ICD 10: E11.65  Urinary frequency -     Cancel: Urinalysis w microscopic + reflex cultur -     UA/M w/rflx Culture, Comp  Other orders -     Microscopic Examination   I have discontinued Ratasha M. Jeangilles's Clickfine Pen Needles. I have also changed her metFORMIN. Additionally, I am having her start on fluconazole, metroNIDAZOLE, Insulin Glargine, Pen Needles, and blood glucose meter kit and supplies. Lastly, I am having her maintain her multivitamin with minerals, latanoprost, atorvastatin, atenolol, amLODipine, traMADol, and Vitamin D (Ergocalciferol).  Meds ordered this encounter  Medications  . fluconazole (DIFLUCAN) 150 MG tablet    Sig: Take 1 tablet (150 mg total) by mouth once for 1 dose.    Dispense:  1 tablet    Refill:  0    Order  Specific Question:   Supervising Provider    Answer:   Lucille Passy [[id number]]  . metroNIDAZOLE (METROGEL VAGINAL) 0.75 % vaginal gel    Sig: Place 1 Applicatorful vaginally at bedtime.    Dispense:  70 g    Refill:  0    Order Specific Question:   Supervising Provider    Answer:   Lucille Passy [[id number]]  . metFORMIN (GLUCOPHAGE) 850 MG tablet    Sig: Take 1 tablet (850 mg total) by mouth 2 (two) times daily  with a meal.    Dispense:  60 tablet    Refill:  5    Change in dose    Order Specific Question:   Supervising Provider    Answer:   MATTHEWS, CODY [[id number]]  . Insulin Glargine (LANTUS) 100 UNIT/ML Solostar Pen    Sig: Inject 10 Units into the skin at bedtime.    Dispense:  15 mL    Refill:  0    Order Specific Question:   Supervising Provider    Answer:   MATTHEWS, CODY [[id number]]  . Insulin Pen Needle (PEN NEEDLES) 31G X 6 MM MISC    Sig: 1 application by Does not apply route at bedtime.    Dispense:  90 each    Refill:  1    Order Specific Question:   Supervising Provider    Answer:   MATTHEWS, CODY [[id number]]  . blood glucose meter kit and supplies KIT    Sig: Dispense based on patient and insurance preference. Check glucose before breakfast and at bedtime. ICD 10: E11.65    Dispense:  1 each    Refill:  0    Order Specific Question:   Supervising Provider    Answer:   MATTHEWS, CODY [[id number]]    Order Specific Question:   Number of strips    Answer:   100    Order Specific Question:   Number of lancets    Answer:   100    Problem List Items Addressed This Visit      Cardiovascular and Mediastinum   HTN (hypertension), benign   Relevant Orders   Hemoglobin A1c (Completed)   Basic metabolic panel (Completed)     Endocrine   DM (diabetes mellitus) ([place])    Worsening DM with HgbA1c at 11 from 8. This is the reason for increased urinary frequency. I increased metformin dose to 866m BID and added lantus 10units at hs. You will also need to start glucose check before breakfast  and at bedtime. F/up with me in 160monthvideo appt)       Relevant Medications   metFORMIN (GLUCOPHAGE) 850 MG tablet   Insulin Glargine (LANTUS) 100 UNIT/ML Solostar Pen   Insulin Pen Needle (PEN NEEDLES) 31G X 6 MM MISC   blood glucose meter kit and supplies KIT   Other Relevant Orders   Hemoglobin A1c (Completed)   Basic metabolic panel (Completed)    Other Visit Diagnoses    Acute vaginitis    -  Primary   Relevant Medications   metroNIDAZOLE (METROGEL VAGINAL) 0.75 % vaginal gel   Other Relevant Orders   Cervicovaginal ancillary only( [place])   Urinary frequency       Relevant Orders   UA/M w/rflx Culture, Comp (Completed)      Follow-up: Return in 4 weeks (on 03/10/2019) for DM (video appt, 3054m).  ChaWilfred LacyP

## 2019-02-10 NOTE — Patient Instructions (Addendum)
Use [place] of any water based unscented lubricant during intercourse. No sexual activity till vaginal symptoms resolve.  Worsening DM with HgbA1c at 11 from 8. This is the reason for increased urinary frequency. I increased metformin dose to 850mg  BID and added lantus 10units at hs. You will also need to start glucose check before breakfast and at bedtime. F/up with me in 73month (video appt)

## 2019-02-11 LAB — CERVICOVAGINAL ANCILLARY ONLY
Bacterial Vaginitis (gardnerella): NEGATIVE
Candida Glabrata: POSITIVE — AB
Candida Vaginitis: POSITIVE — AB
Chlamydia: NEGATIVE
Comment: NEGATIVE
Comment: NEGATIVE
Comment: NEGATIVE
Comment: NEGATIVE
Comment: NEGATIVE
Comment: NORMAL
Neisseria Gonorrhea: NEGATIVE
Trichomonas: NEGATIVE

## 2019-02-11 MED ORDER — BLOOD GLUCOSE MONITOR KIT: PACK | 0 refills | Status: DC

## 2019-02-11 MED ORDER — FLUCONAZOLE 150 MG PO TABS: 150.0000 mg | ORAL_TABLET | Freq: Once | ORAL | 0 refills | Status: AC

## 2019-02-11 MED ORDER — PEN NEEDLES 31G X 6 MM MISC: 1.0000 "application " | Freq: Every day | 1 refills | Status: DC

## 2019-02-11 MED ORDER — INSULIN GLARGINE 100 UNIT/ML SOLOSTAR PEN: 10.0000 [IU] | PEN_INJECTOR | Freq: Every day | SUBCUTANEOUS | 0 refills | Status: DC

## 2019-02-11 MED ORDER — GLIPIZIDE ER 5 MG PO TB24: 5.0000 mg | ORAL_TABLET | Freq: Every day | ORAL | 5 refills | Status: DC

## 2019-02-11 MED ORDER — METFORMIN HCL 850 MG PO TABS: 850.0000 mg | ORAL_TABLET | Freq: Two times a day (BID) | ORAL | 5 refills | Status: DC

## 2019-02-11 NOTE — Addendum Note (Signed)
Addended by: [name] L on: 02/11/2019 04:15 PM   Modules accepted: Orders

## 2019-02-11 NOTE — Assessment & Plan Note (Signed)
Worsening DM with HgbA1c at 11 from 8. This is the reason for increased urinary frequency. I increased metformin dose to 850mg  BID and added lantus 10units at hs. You will also need to start glucose check before breakfast and at bedtime. F/up with me in 11month (video appt)

## 2019-02-11 NOTE — Addendum Note (Signed)
Addended by: [name] L on: 02/11/2019 02:04 PM   Modules accepted: Orders

## 2019-02-15 ENCOUNTER — Telehealth: Payer: Self-pay | Admitting: Nurse Practitioner

## 2019-02-15 DIAGNOSIS — E782 Mixed hyperlipidemia: Secondary | ICD-10-CM

## 2019-02-15 DIAGNOSIS — I10 Essential (primary) hypertension: Secondary | ICD-10-CM

## 2019-02-15 LAB — BASIC METABOLIC PANEL
BUN/Creatinine Ratio: 21 (ref 9–23)
BUN: 16 mg/dL (ref 6–24)
CO2: 21 mmol/L (ref 20–29)
Calcium: 9.7 mg/dL (ref 8.7–10.2)
Chloride: 99 mmol/L (ref 96–106)
Creatinine, Ser: 0.78 mg/dL (ref 0.57–1.00)
GFR calc Af Amer: 97 mL/min/{1.73_m2} (ref >59)
GFR calc non Af Amer: 84 mL/min/{1.73_m2} (ref >59)
Glucose: 395 mg/dL — ABNORMAL HIGH (ref 65–99)
Potassium: 4.9 mmol/L (ref 3.5–5.2)
Sodium: 137 mmol/L (ref 134–144)

## 2019-02-15 LAB — UA/M W/RFLX CULTURE, COMP
Bilirubin, UA: NEGATIVE
Ketones, UA: NEGATIVE
Leukocytes,UA: NEGATIVE
Nitrite, UA: NEGATIVE
Protein,UA: NEGATIVE
RBC, UA: NEGATIVE
Specific Gravity, UA: 1.029 (ref 1.005–1.030)
Urobilinogen, Ur: 0.2 mg/dL (ref 0.2–1.0)
pH, UA: 5.5 (ref 5.0–7.5)

## 2019-02-15 LAB — MICROSCOPIC EXAMINATION
Bacteria, UA: NONE SEEN
Casts: NONE SEEN /lpf
Epithelial Cells (non renal): NONE SEEN /hpf (ref 0–10)
WBC, UA: NONE SEEN /hpf (ref 0–5)

## 2019-02-15 LAB — HEMOGLOBIN A1C
Est. average glucose Bld gHb Est-mCnc: 286 mg/dL
Hgb A1c MFr Bld: 11.6 % — ABNORMAL HIGH (ref 4.8–5.6)

## 2019-02-15 MED ORDER — ATORVASTATIN CALCIUM 40 MG PO TABS: 40.0000 mg | ORAL_TABLET | Freq: Every day | ORAL | 3 refills | Status: DC

## 2019-02-15 MED ORDER — AMLODIPINE BESYLATE 5 MG PO TABS: 5.0000 mg | ORAL_TABLET | Freq: Every day | ORAL | 3 refills | Status: DC

## 2019-02-15 NOTE — Telephone Encounter (Signed)
Refill sent.

## 2019-02-15 NOTE — Telephone Encounter (Signed)
-----   Message from Shawnie Pons, LPN sent at [phone]  1:24 PM EST ----- Pt is aware of message below. Pt was wondering if you still wants her to go back on metronidazole gel? Pt also request refill for amlodipine and atorvastatin send to [facility]. Please advise, ok to send in?

## 2019-02-15 NOTE — Telephone Encounter (Signed)
Please advise about metronidazole gel?

## 2019-02-15 NOTE — Progress Notes (Signed)
She does not need metronidazole gel

## 2019-02-16 ENCOUNTER — Other Ambulatory Visit: Payer: Self-pay

## 2019-02-16 ENCOUNTER — Ambulatory Visit (INDEPENDENT_AMBULATORY_CARE_PROVIDER_SITE_OTHER): Payer: BC Managed Care – PPO | Admitting: Bariatrics

## 2019-02-16 ENCOUNTER — Ambulatory Visit (INDEPENDENT_AMBULATORY_CARE_PROVIDER_SITE_OTHER): Payer: Managed Care, Other (non HMO) | Admitting: Bariatrics

## 2019-02-16 ENCOUNTER — Encounter (INDEPENDENT_AMBULATORY_CARE_PROVIDER_SITE_OTHER): Payer: Self-pay | Admitting: Bariatrics

## 2019-02-16 VITALS — BP 145/82 | HR 83 | Temp 98.4°F | Ht 64.0 in | Wt 334.0 lb

## 2019-02-16 DIAGNOSIS — I10 Essential (primary) hypertension: Secondary | ICD-10-CM

## 2019-02-16 DIAGNOSIS — E1165 Type 2 diabetes mellitus with hyperglycemia: Secondary | ICD-10-CM

## 2019-02-16 DIAGNOSIS — Z9189 Other specified personal risk factors, not elsewhere classified: Secondary | ICD-10-CM

## 2019-02-16 DIAGNOSIS — F3289 Other specified depressive episodes: Secondary | ICD-10-CM

## 2019-02-16 DIAGNOSIS — Z6841 Body Mass Index (BMI) 40.0 and over, adult: Secondary | ICD-10-CM

## 2019-02-16 MED ORDER — BUPROPION HCL ER (SR) 150 MG PO TB12: 150.0000 mg | ORAL_TABLET | Freq: Every day | ORAL | 0 refills | Status: DC

## 2019-02-16 NOTE — Telephone Encounter (Signed)
Pt is aware.  

## 2019-02-16 NOTE — Telephone Encounter (Signed)
She does not need metronidazole gel. Wet prep did not indicate BV

## 2019-02-17 ENCOUNTER — Encounter: Payer: Self-pay | Admitting: Nurse Practitioner

## 2019-02-17 NOTE — Progress Notes (Signed)
Chief Complaint: OBESITY Ruth [name] is here to discuss her progress with her obesity treatment plan along with follow-up of her obesity related diagnoses. Ruth [name] is on the Category 3 Plan and states she is following her eating plan approximately 0% of the time. Ruth [name] states she is exercising 0 minutes 0 times per week.  Today's visit was #: 9 Starting weight: 336 lbs Starting date: 09/07/2018 Today's weight: 334 lbs Today's date: 02/16/2019 Total lbs lost to date: 2  Total lbs lost since last in-office visit: 0  Interim History: Ruth [name]'s weight remains the same. She struggled with sweets during the holiday and reports her portion size was high.  Subjective:   Type 2 diabetes mellitus with hyperglycemia, without long-term current use of insulin (Ruth [name]). Ruth [name] is taking glipizide and metformin and has tried Ruth [name] ([place]). She states fasting blood sugars range between 200 and 300's. Last A1c 11.6 on 02/10/2019.  Essential hypertension. Ruth [name] is taking Norvasc. No lightheadedness.  Other depression, with emotional eating. No contraindications.  At risk for hypoglycemia. Ruth [name] is at increased risk for hypoglycemia due to changes in diet, diagnosis of diabetes, and/or insulin use. Ruth [name] is not currently taking insulin.  Assessment/Plan:   Type 2 diabetes mellitus with hyperglycemia, without long-term current use of insulin (Ruth [name]). Ruth [name] will continue her medications. Her PCP changed metformin to BID and added glipizide. She will follow-up with her PCP at the end of the month. She is at risk for blood sugars dropping due to glipizide.   Essential hypertension. Ruth [name] will continue medications as prescribed.  Other depression, with emotional eating. Ruth [name] was given a prescription for Wellbutrin 150 mg 1 PO daily #30 with 0 refills. She agrees to follow-up with our clinic in 2-3 weeks.  At risk for hypoglycemia. Ruth [name] was given approximately 15 minutes of counseling today  regarding prevention of hypoglycemia. Ruth [name] was advised of symptoms of hypoglycemia. Ruth [name] was instructed to eat regular meals.  Class 3 severe obesity with serious comorbidity and body mass index (BMI) of 50.0 to 59.9 in adult, unspecified obesity type (Ruth [name]).  TIME SPENT: 25 minutes   Ruth [name] is currently in the action stage of change. As such, her goal is to continue with weight loss efforts. She has agreed to Category 3 Plan.   She will work on meal planning, intentional eating, will cut out juice, and will increase her water intake.  We discussed the following exercise goals today: For substantial health benefits, adults should do at least 150 minutes (2 hours and 30 minutes) a week of moderate-intensity, or 75 minutes (1 hour and 15 minutes) a week of vigorous-intensity aerobic physical activity, or an equivalent combination of moderate- and vigorous-intensity aerobic activity. Aerobic activity should be performed in episodes of at least 10 minutes, and preferably, it should be spread throughout the week. Adults should also include muscle-strengthening activities that involve all major muscle groups on 2 or more days a week.  We discussed the following behavioral modification strategies today: increasing lean protein intake, decreasing simple carbohydrates, increasing vegetables, increasing water intake, decreasing eating out, no skipping meals, meal planning and cooking strategies, keeping healthy foods in the home and planning for success.  Ruth [name] has agreed to follow-up with our clinic in 2-3 weeks. She was informed of the importance of frequent follow-up visits to maximize her success with intensive lifestyle modifications for her multiple health conditions.  Objective:   Blood pressure (!) 145/82, pulse 83, temperature 98.4 F (36.9 C), height  5\' 4"  (1.626 m), weight (!) 334 lb (151.5 kg), SpO2 97 %. Body mass index is 57.33 kg/m.  General: Cooperative, alert, well developed,  in no acute distress. HEENT: Conjunctivae and lids unremarkable. Neck: No thyromegaly.  Cardiovascular: Regular rhythm.  Lungs: Normal work of breathing. Extremities: No edema.  Neurologic: No focal deficits.   Lab Results  Component Value Date   CREATININE 0.78 02/10/2019   BUN 16 02/10/2019   NA 137 02/10/2019   K 4.9 02/10/2019   CL 99 02/10/2019   CO2 21 02/10/2019   Lab Results  Component Value Date   ALT 22 10/26/2018   AST 18 10/26/2018   ALKPHOS 92 10/26/2018   BILITOT 0.3 10/26/2018   Lab Results  Component Value Date   HGBA1C 11.6 (H) 02/10/2019   HGBA1C 8.3 (H) 08/21/2018   HGBA1C 6.2 (A) 02/03/2018   HGBA1C 6.5 (H) 08/22/2017   HGBA1C 7.6 (H) 08/02/2016   No results found for: INSULIN Lab Results  Component Value Date   TSH 1.760 09/07/2018   Lab Results  Component Value Date   CHOL 210 (H) 08/21/2018   HDL 52 08/21/2018   LDLCALC 138 (H) 08/21/2018   TRIG 95 08/21/2018   CHOLHDL 4.0 08/21/2018   Lab Results  Component Value Date   WBC 7.4 10/30/2017   HGB 12.7 10/30/2017   HCT 39.4 10/30/2017   MCV 92.3 10/30/2017   PLT 253 10/30/2017   No results found for: IRON, TIBC, FERRITIN  Attestation Statements:   Reviewed by clinician on day of visit: allergies, medications, problem list, medical history, surgical history, family history, social history and previous encounter notes.  This visit occurred during the SARS-CoV-2 public health emergency. Safety protocols were in place, including screening questions prior to the visit, additional usage of staff PPE, and extensive cleaning of exam room while observing appropriate contact time as indicated for disinfecting solutions. (CPT [id number])  I, Michaelene Song, am acting as transcriptionist for [employer], DO  I have reviewed the above documentation for accuracy and completeness, and I agree with the above. Jearld Lesch, DO

## 2019-02-18 ENCOUNTER — Encounter: Payer: Self-pay | Admitting: Podiatry

## 2019-02-18 ENCOUNTER — Ambulatory Visit: Payer: Managed Care, Other (non HMO) | Admitting: Podiatry

## 2019-02-18 ENCOUNTER — Other Ambulatory Visit: Payer: Self-pay

## 2019-02-18 DIAGNOSIS — M76821 Posterior tibial tendinitis, right leg: Secondary | ICD-10-CM

## 2019-02-18 DIAGNOSIS — Z09 Encounter for follow-up examination after completed treatment for conditions other than malignant neoplasm: Secondary | ICD-10-CM

## 2019-02-18 DIAGNOSIS — M722 Plantar fascial fibromatosis: Secondary | ICD-10-CM | POA: Diagnosis not present

## 2019-02-18 NOTE — Progress Notes (Signed)
She presents today date of surgery October 30, 2018 status post EPF posterior tibial tendon repair peroneal tendon repair on the right foot.  States that she is doing much better with physical therapy swelling is going down and the pain is down she states that she is about 75% improved from the pain she was experiencing prior to surgery.  Objective: Vital signs stable alert oriented x3 mild edema no erythema cellulitis drainage or odor she has good inversion and eversion against resistance.  Minimal tenderness on palpation of the plantar fascial area.  Assessment: Resolving surgical pain and well-healing surgical foot right.  Plan: I am going to encourage her to continue physical therapy and continue to wear her tennis shoe at all times.  We will follow-up with her in 1 month at which time we hope to be [place] to release her to get back to work but in the meantime we are going to ask working if she would be [place] to perform a seated job.

## 2019-02-19 ENCOUNTER — Encounter: Payer: Self-pay | Admitting: Nurse Practitioner

## 2019-02-19 DIAGNOSIS — E1165 Type 2 diabetes mellitus with hyperglycemia: Secondary | ICD-10-CM

## 2019-02-19 MED ORDER — GLUCOSE BLOOD VI STRP: ORAL_STRIP | 12 refills | Status: DC

## 2019-02-20 ENCOUNTER — Encounter (INDEPENDENT_AMBULATORY_CARE_PROVIDER_SITE_OTHER): Payer: Self-pay | Admitting: Bariatrics

## 2019-03-02 ENCOUNTER — Encounter: Payer: Self-pay | Admitting: Nurse Practitioner

## 2019-03-04 ENCOUNTER — Telehealth: Payer: Self-pay | Admitting: Nurse Practitioner

## 2019-03-04 DIAGNOSIS — I10 Essential (primary) hypertension: Secondary | ICD-10-CM

## 2019-03-04 MED ORDER — AMLODIPINE BESYLATE 5 MG PO TABS: 5.0000 mg | ORAL_TABLET | Freq: Every day | ORAL | 0 refills | Status: DC

## 2019-03-04 NOTE — Telephone Encounter (Signed)
Temporary supply of what? All medications?

## 2019-03-04 NOTE — Telephone Encounter (Signed)
amLODipine (NORVASC) 5 MG tablet. Pt request 1 mo if possible.

## 2019-03-04 NOTE — Telephone Encounter (Signed)
Ok to send

## 2019-03-04 NOTE — Telephone Encounter (Signed)
Pt request temporary supply to [facility] until [employer] for one month. Please advice.

## 2019-03-05 ENCOUNTER — Encounter (HOSPITAL_COMMUNITY): Payer: Self-pay | Admitting: Emergency Medicine

## 2019-03-05 ENCOUNTER — Ambulatory Visit (INDEPENDENT_AMBULATORY_CARE_PROVIDER_SITE_OTHER): Payer: Managed Care, Other (non HMO)

## 2019-03-05 ENCOUNTER — Inpatient Hospital Stay (HOSPITAL_COMMUNITY)
Admission: EM | Admit: 2019-03-05 | Discharge: 2019-03-09 | DRG: 177 | Disposition: A | Discharge status: Home / Self Care | Payer: Managed Care, Other (non HMO) | Attending: Internal Medicine | Admitting: Internal Medicine

## 2019-03-05 ENCOUNTER — Telehealth (INDEPENDENT_AMBULATORY_CARE_PROVIDER_SITE_OTHER): Payer: Managed Care, Other (non HMO) | Admitting: Nurse Practitioner

## 2019-03-05 ENCOUNTER — Ambulatory Visit (HOSPITAL_COMMUNITY)
Admission: EM | Admit: 2019-03-05 | Discharge: 2019-03-05 | Disposition: A | Discharge status: Home / Self Care | Payer: Managed Care, Other (non HMO) | Source: Home / Self Care | Attending: Family Medicine | Admitting: Family Medicine

## 2019-03-05 ENCOUNTER — Encounter: Payer: Self-pay | Admitting: Nurse Practitioner

## 2019-03-05 VITALS — HR 87 | Temp 99.1°F | Ht 64.0 in

## 2019-03-05 DIAGNOSIS — Y92239 Unspecified place in hospital as the place of occurrence of the external cause: Secondary | ICD-10-CM | POA: Diagnosis not present

## 2019-03-05 DIAGNOSIS — K219 Gastro-esophageal reflux disease without esophagitis: Secondary | ICD-10-CM | POA: Diagnosis present

## 2019-03-05 DIAGNOSIS — Z87891 Personal history of nicotine dependence: Secondary | ICD-10-CM | POA: Diagnosis not present

## 2019-03-05 DIAGNOSIS — M51379 Other intervertebral disc degeneration, lumbosacral region without mention of lumbar back pain or lower extremity pain: Secondary | ICD-10-CM | POA: Diagnosis present

## 2019-03-05 DIAGNOSIS — E1165 Type 2 diabetes mellitus with hyperglycemia: Secondary | ICD-10-CM

## 2019-03-05 DIAGNOSIS — Z811 Family history of alcohol abuse and dependence: Secondary | ICD-10-CM | POA: Diagnosis not present

## 2019-03-05 DIAGNOSIS — U071 COVID-19: Secondary | ICD-10-CM

## 2019-03-05 DIAGNOSIS — Z823 Family history of stroke: Secondary | ICD-10-CM | POA: Diagnosis not present

## 2019-03-05 DIAGNOSIS — E7849 Other hyperlipidemia: Secondary | ICD-10-CM

## 2019-03-05 DIAGNOSIS — M5137 Other intervertebral disc degeneration, lumbosacral region: Secondary | ICD-10-CM | POA: Diagnosis not present

## 2019-03-05 DIAGNOSIS — T380X5A Adverse effect of glucocorticoids and synthetic analogues, initial encounter: Secondary | ICD-10-CM | POA: Diagnosis not present

## 2019-03-05 DIAGNOSIS — Z79899 Other long term (current) drug therapy: Secondary | ICD-10-CM

## 2019-03-05 DIAGNOSIS — M109 Gout, unspecified: Secondary | ICD-10-CM | POA: Diagnosis present

## 2019-03-05 DIAGNOSIS — E785 Hyperlipidemia, unspecified: Secondary | ICD-10-CM | POA: Diagnosis present

## 2019-03-05 DIAGNOSIS — E559 Vitamin D deficiency, unspecified: Secondary | ICD-10-CM | POA: Diagnosis present

## 2019-03-05 DIAGNOSIS — J1282 Pneumonia due to coronavirus disease 2019: Secondary | ICD-10-CM

## 2019-03-05 DIAGNOSIS — Z7984 Long term (current) use of oral hypoglycemic drugs: Secondary | ICD-10-CM | POA: Diagnosis not present

## 2019-03-05 DIAGNOSIS — I10 Essential (primary) hypertension: Secondary | ICD-10-CM | POA: Diagnosis present

## 2019-03-05 DIAGNOSIS — Z8371 Family history of colonic polyps: Secondary | ICD-10-CM

## 2019-03-05 DIAGNOSIS — R0602 Shortness of breath: Secondary | ICD-10-CM

## 2019-03-05 DIAGNOSIS — E860 Dehydration: Secondary | ICD-10-CM

## 2019-03-05 DIAGNOSIS — Z6841 Body Mass Index (BMI) 40.0 and over, adult: Secondary | ICD-10-CM

## 2019-03-05 DIAGNOSIS — Z90711 Acquired absence of uterus with remaining cervical stump: Secondary | ICD-10-CM | POA: Diagnosis not present

## 2019-03-05 DIAGNOSIS — Z800 Family history of malignant neoplasm of digestive organs: Secondary | ICD-10-CM

## 2019-03-05 DIAGNOSIS — Z9049 Acquired absence of other specified parts of digestive tract: Secondary | ICD-10-CM

## 2019-03-05 DIAGNOSIS — J9601 Acute respiratory failure with hypoxia: Secondary | ICD-10-CM | POA: Diagnosis present

## 2019-03-05 DIAGNOSIS — Z888 Allergy status to other drugs, medicaments and biological substances status: Secondary | ICD-10-CM

## 2019-03-05 DIAGNOSIS — E119 Type 2 diabetes mellitus without complications: Secondary | ICD-10-CM

## 2019-03-05 DIAGNOSIS — E1169 Type 2 diabetes mellitus with other specified complication: Secondary | ICD-10-CM | POA: Diagnosis present

## 2019-03-05 DIAGNOSIS — Z8249 Family history of ischemic heart disease and other diseases of the circulatory system: Secondary | ICD-10-CM | POA: Diagnosis not present

## 2019-03-05 DIAGNOSIS — Z841 Family history of disorders of kidney and ureter: Secondary | ICD-10-CM | POA: Diagnosis not present

## 2019-03-05 DIAGNOSIS — Z833 Family history of diabetes mellitus: Secondary | ICD-10-CM | POA: Diagnosis not present

## 2019-03-05 DIAGNOSIS — M1990 Unspecified osteoarthritis, unspecified site: Secondary | ICD-10-CM | POA: Diagnosis present

## 2019-03-05 LAB — FIBRINOGEN: Fibrinogen: 788 mg/dL — ABNORMAL HIGH (ref 210–475)

## 2019-03-05 LAB — CBC WITH DIFFERENTIAL/PLATELET
Abs Immature Granulocytes: 0 10*3/uL (ref 0.00–0.07)
Basophils Absolute: 0 10*3/uL (ref 0.0–0.1)
Basophils Relative: 0 %
Eosinophils Absolute: 0 10*3/uL (ref 0.0–0.5)
Eosinophils Relative: 0 %
HCT: 42.3 % (ref 36.0–46.0)
Hemoglobin: 13.6 g/dL (ref 12.0–15.0)
Lymphocytes Relative: 22 %
Lymphs Abs: 1.3 10*3/uL (ref 0.7–4.0)
MCH: 29.2 pg (ref 26.0–34.0)
MCHC: 32.2 g/dL (ref 30.0–36.0)
MCV: 91 fL (ref 80.0–100.0)
Monocytes Absolute: 0.2 10*3/uL (ref 0.1–1.0)
Monocytes Relative: 4 %
Neutro Abs: 4.2 10*3/uL (ref 1.7–7.7)
Neutrophils Relative %: 74 %
Platelets: 225 10*3/uL (ref 150–400)
RBC: 4.65 MIL/uL (ref 3.87–5.11)
RDW: 13.5 % (ref 11.5–15.5)
WBC: 5.7 10*3/uL (ref 4.0–10.5)
nRBC: 0 % (ref 0.0–0.2)
nRBC: 0 /100 WBC

## 2019-03-05 LAB — TROPONIN I (HIGH SENSITIVITY): Troponin I (High Sensitivity): 3 ng/L (ref <18)

## 2019-03-05 LAB — RESPIRATORY PANEL BY RT PCR (FLU A&B, COVID)
Influenza A by PCR: NEGATIVE
Influenza B by PCR: NEGATIVE
SARS Coronavirus 2 by RT PCR: POSITIVE — AB

## 2019-03-05 LAB — COMPREHENSIVE METABOLIC PANEL
ALT: 51 U/L — ABNORMAL HIGH (ref 0–44)
AST: 49 U/L — ABNORMAL HIGH (ref 15–41)
Albumin: 3.1 g/dL — ABNORMAL LOW (ref 3.5–5.0)
Alkaline Phosphatase: 94 U/L (ref 38–126)
Anion gap: 10 (ref 5–15)
BUN: 10 mg/dL (ref 6–20)
CO2: 24 mmol/L (ref 22–32)
Calcium: 8.8 mg/dL — ABNORMAL LOW (ref 8.9–10.3)
Chloride: 100 mmol/L (ref 98–111)
Creatinine, Ser: 0.84 mg/dL (ref 0.44–1.00)
GFR calc Af Amer: >60 mL/min (ref >60)
GFR calc non Af Amer: >60 mL/min (ref >60)
Glucose, Bld: 227 mg/dL — ABNORMAL HIGH (ref 70–99)
Potassium: 4.7 mmol/L (ref 3.5–5.1)
Sodium: 134 mmol/L — ABNORMAL LOW (ref 135–145)
Total Bilirubin: 1.1 mg/dL (ref 0.3–1.2)
Total Protein: 6.9 g/dL (ref 6.5–8.1)

## 2019-03-05 LAB — LACTIC ACID, PLASMA
Lactic Acid, Venous: 1.6 mmol/L (ref 0.5–1.9)
Lactic Acid, Venous: 1.4 mmol/L (ref 0.5–1.9)

## 2019-03-05 LAB — CBG MONITORING, ED
Glucose-Capillary: 210 mg/dL — ABNORMAL HIGH (ref 70–99)
Glucose-Capillary: 186 mg/dL — ABNORMAL HIGH (ref 70–99)

## 2019-03-05 LAB — C-REACTIVE PROTEIN: CRP: 14.6 mg/dL — ABNORMAL HIGH (ref <1.0)

## 2019-03-05 LAB — LACTATE DEHYDROGENASE: LDH: 335 U/L — ABNORMAL HIGH (ref 98–192)

## 2019-03-05 LAB — D-DIMER, QUANTITATIVE: D-Dimer, Quant: 0.92 ug/mL-FEU — ABNORMAL HIGH (ref 0.00–0.50)

## 2019-03-05 LAB — I-STAT BETA HCG BLOOD, ED (MC, WL, AP ONLY): I-stat hCG, quantitative: <5 m[IU]/mL (ref <5)

## 2019-03-05 MED ORDER — ONDANSETRON HCL 4 MG/2ML IJ SOLN
4.0000 mg | Freq: Once | INTRAMUSCULAR | Status: AC
  Administered 2019-03-05: 4 mg via INTRAVENOUS
  Filled 2019-03-05: qty 2

## 2019-03-05 MED ORDER — DEXAMETHASONE SODIUM PHOSPHATE 10 MG/ML IJ SOLN
6.0000 mg | INTRAMUSCULAR | Status: DC
  Administered 2019-03-06 – 2019-03-08 (×4): 6 mg via INTRAVENOUS
  Filled 2019-03-05 (×4): qty 1

## 2019-03-05 MED ORDER — INSULIN ASPART 100 UNIT/ML ~~LOC~~ SOLN: 0.0000 [IU] | Freq: Every day | SUBCUTANEOUS | Status: DC

## 2019-03-05 MED ORDER — INSULIN ASPART 100 UNIT/ML ~~LOC~~ SOLN
0.0000 [IU] | Freq: Three times a day (TID) | SUBCUTANEOUS | Status: DC
  Administered 2019-03-06: 15 [IU] via SUBCUTANEOUS
  Administered 2019-03-06: 8 [IU] via SUBCUTANEOUS

## 2019-03-05 MED ORDER — SODIUM CHLORIDE 0.9 % IV SOLN
100.0000 mg | Freq: Every day | INTRAVENOUS | Status: DC
  Administered 2019-03-06 – 2019-03-07 (×2): 100 mg via INTRAVENOUS
  Filled 2019-03-05: qty 20

## 2019-03-05 MED ORDER — SODIUM CHLORIDE 0.9 % IV SOLN
200.0000 mg | Freq: Once | INTRAVENOUS | Status: AC
  Administered 2019-03-06: 200 mg via INTRAVENOUS
  Filled 2019-03-05 (×2): qty 40

## 2019-03-05 NOTE — Progress Notes (Signed)
Virtual Visit via Video Note  I connected with@ on 03/05/19 at  9:15 AM EST by a video enabled telemedicine application and verified that I am speaking with the correct person using two identifiers.  Location: Patient:Home Provider: Office Participants: patient and provider  I discussed the limitations of evaluation and management by telemedicine and the availability of in person appointments. I also discussed with the patient that there may be a patient responsible charge related to this service. The patient expressed understanding and agreed to proceed.  [id number] infection  History of Present Illness: Cough This is a new problem. The current episode started in the past 7 days. The problem has been gradually worsening. The problem occurs constantly. The cough is productive of sputum and productive of purulent sputum. Associated symptoms include chest pain, chills, a fever, headaches, myalgias, nasal congestion, postnasal drip, rhinorrhea, a sore throat, shortness of breath and sweats. Pertinent negatives include no heartburn, hemoptysis, rash, weight loss or wheezing. The symptoms are aggravated by lying down. She has tried OTC cough suppressant for the symptoms. The treatment provided no relief.  also reports diarrhea 2-3x/day and nausea. COVID positive on 02/27/2019 Husband is also positive and symptomatic. O2 sat ranges from 88-92% at rest.  Observations/Objective: Physical Exam  Constitutional: She is oriented to person, place, and time. She appears lethargic.  HENT:  Dry oral mucosa  Pulmonary/Chest:  Appears SOB at rest  Neurological: She is oriented to person, place, and time. She appears lethargic.   Assessment and Plan: Kalanie was seen today for cough.  Diagnoses and all orders for this visit:  COVID-19 virus infection -     MYCHART COVID-19 HOME MONITORING PROGRAM -     Temperature monitoring; Future  Dehydration  SOB (shortness of breath)   Follow Up  Instructions: See avs   I discussed the assessment and treatment plan with the patient. The patient was provided an opportunity to ask questions and all were answered. The patient agreed with the plan and demonstrated an understanding of the instructions.   The patient was advised to call back or seek an in-person evaluation if the symptoms worsen or if the condition fails to improve as anticipated.   [name], NP

## 2019-03-05 NOTE — ED Provider Notes (Signed)
[facility]    CSN: [id number] Arrival date & time: 03/05/19  1127      History   Chief Complaint Chief Complaint  Patient presents with  . Covid Positive  . Weakness  . Cough  . Diarrhea    HPI Ruth [name] is a 59 y.o. female.   [employer] [place] presents with complaints of persistent shortness of breath, nausea and diarrhea. Productive cough. Symptoms started 1/12, tested positive for covid-19, as did her husband, on 1/16. She states that the body aches and fevers have subsided, but with persistent shortness of breath , worse with activity. No chest pain . Two episodes of non bloody diarrhea. She has been monitoring her oxygen at home and has found that it fluctuates, as low at 77%. No history of asthma. She has been taking vitamin c, black elderberry and tylenol for her symptoms. Drinking fluids, decreased appetite. History  Of gerd, dm, gout, htn, obesity, palpitations.    ROS per HPI, negative if not otherwise mentioned.      Past Medical History:  Diagnosis Date  . Anemia   . Arthritis   . Back pain   . Bilateral calcaneal spurs   . Diabetes mellitus without complication ([place])    type 2  . GERD (gastroesophageal reflux disease)   . Gout   . Hyperlipidemia   . Hypertension   . Obesity   . Osteoarthritis   . Palpitations   . Swallowing difficulty   . Vitamin D deficiency     Patient Active Problem List   Diagnosis Date Noted  . Vitamin D deficiency 11/04/2018  . Class 3 severe obesity with serious comorbidity and body mass index (BMI) of 50.0 to 59.9 in adult ([place]) 10/28/2018  . Drug-induced constipation 10/26/2018  . Breast mass, right 12/18/2017  . Chronic bilateral low back pain with right-sided sciatica 10/31/2017  . Spinal stenosis of lumbosacral region 10/31/2017  . DDD (degenerative disc disease), lumbosacral 10/31/2017  . Controlled substance agreement signed 10/31/2017  . Acute right-sided low back pain with right-sided sciatica  08/11/2017  . Hematochezia   . Benign neoplasm of colon   . Benign neoplasm of transverse colon   . Benign neoplasm of descending colon   . Benign neoplasm of rectum   . Lumbar paraspinal muscle spasm 07/31/2016  . DM (diabetes mellitus) ([place]) 04/29/2016  . HTN (hypertension), benign 04/29/2016  . Hyperlipidemia 04/29/2016  . Morbid obesity with BMI of 50.0-59.9, adult ([place]) 04/29/2016    Past Surgical History:  Procedure Laterality Date  . ABDOMINAL HYSTERECTOMY     partial  . CHOLECYSTECTOMY    . COLONOSCOPY WITH PROPOFOL N/A 10/29/2016   Procedure: COLONOSCOPY WITH PROPOFOL;  Surgeon: Yetta Flock, MD;  Location: [facility];  Service: Gastroenterology;  Laterality: N/A;  . DENTAL SURGERY    . ORIF TIBIA & FIBULA FRACTURES Left     OB History    Gravida  5   Para  3   Term      Preterm      AB      Living        SAB      TAB      Ectopic      Multiple      Live Births               Home Medications    Prior to Admission medications   Medication Sig Start Date End Date Taking? Authorizing Provider  amLODipine (  NORVASC) 5 MG tablet Take 1 tablet (5 mg total) by mouth at bedtime. 03/04/19   Nche, Charlene Brooke, NP  atenolol (TENORMIN) 50 MG tablet Take 1 tablet (50 mg total) by mouth daily. 08/24/18   Nche, Charlene Brooke, NP  atorvastatin (LIPITOR) 40 MG tablet Take 1 tablet (40 mg total) by mouth daily at 6 PM. 02/15/19   Nche, Charlene Brooke, NP  buPROPion (WELLBUTRIN SR) 150 MG 12 hr tablet Take 1 tablet (150 mg total) by mouth daily. 02/16/19   Jearld Lesch A, DO  glipiZIDE (GLUCOTROL XL) 5 MG 24 hr tablet Take 1 tablet (5 mg total) by mouth daily with breakfast. With heaviest meals 02/11/19   Nche, Charlene Brooke, NP  glucose blood test strip Check blood glucose before breakfast and at bedtime. Accu Chek test strips 02/19/19   Nche, Charlene Brooke, NP  latanoprost (XALATAN) 0.005 % ophthalmic solution Place 1 drop into both eyes at bedtime.  10/17/17    [provider]  metFORMIN (GLUCOPHAGE) 850 MG tablet Take 1 tablet (850 mg total) by mouth 2 (two) times daily with a meal. 02/11/19   Nche, Charlene Brooke, NP  Multiple Vitamins-Minerals (MULTIVITAMIN WITH MINERALS) tablet Take 1 tablet by mouth daily.    [provider]  traMADol (ULTRAM) 50 MG tablet Take by mouth every 6 (six) hours as needed.    [provider]  Vitamin D, Ergocalciferol, (DRISDOL) 1.25 MG (50000 UT) CAPS capsule Take 1 capsule (50,000 Units total) by mouth every 7 (seven) days. 01/26/19   Georgia Lopes, DO    Family History Family History  Problem Relation Age of Onset  . Diabetes Mother   . Stroke Mother   . Dementia Mother   . Hypertension Mother   . Eating disorder Mother   . Obesity Mother   . Pancreatic cancer Father   . Sudden death Father   . Alcoholism Father   . Colon polyps Sister   . Kidney disease Sister   . Cancer Maternal Grandmother        type unknown    Social History Social History   Tobacco Use  . Smoking status: Former Smoker    Packs/day: 1.00    Years: 20.00    Pack years: 20.00    Quit date: 02/12/2008    Years since quitting: 11.0  . Smokeless tobacco: Never Used  Substance Use Topics  . Alcohol use: No  . Drug use: No     Allergies   Lisinopril and Tape   Review of Systems Review of Systems   Physical Exam Triage Vital Signs ED Triage Vitals  Enc Vitals Group     BP 03/05/19 1201 124/74     Pulse Rate 03/05/19 1201 82     Resp 03/05/19 1201 (!) 24     Temp 03/05/19 1201 98.4 F (36.9 C)     Temp src --      SpO2 03/05/19 1201 94 %     Weight --      Height --      Head Circumference --      Peak Flow --      Pain Score 03/05/19 1202 0     Pain Loc --      Pain Edu? --      Excl. in [place]? --    No data found.  Updated Vital Signs BP 124/74   Pulse 97   Temp 98.4 F (36.9 C)   Resp (!) 24  SpO2 (!) 88%    Physical Exam Constitutional:      General: She is not in  acute distress.    Appearance: She is well-developed.  Cardiovascular:     Rate and Rhythm: Tachycardia present.     Comments: Mild tachypnea noted, worse with activity  Pulmonary:     Effort: Pulmonary effort is normal.     Breath sounds: Decreased breath sounds present.     Comments: Occasional cough noted during exam Skin:    General: Skin is warm and dry.  Neurological:     Mental Status: She is alert and oriented to person, place, and time.      UC Treatments / Results  Labs (all labs ordered are listed, but only abnormal results are displayed) Labs Reviewed - No data to display  EKG   Radiology DG Chest 2 View  Result Date: 03/05/2019 CLINICAL DATA:  COVID-19 positive patient with shortness of breath and cough. EXAM: CHEST - 2 VIEW COMPARISON:  PA and lateral chest 10/27/2017. FINDINGS: Hazy multifocal airspace disease consistent with pneumonia is seen. No pneumothorax or pleural fluid. Heart size is upper normal. No acute or focal bony abnormality. IMPRESSION: Extensive hazy bilateral airspace disease consistent with pneumonia. Electronically Signed   By: Inge Rise M.D.   On: 03/05/2019 12:46    Procedures Procedures (including critical care time)  Medications Ordered in UC Medications - No data to display  Initial Impression / Assessment and Plan / UC Course  I have reviewed the triage vital signs and the nursing notes.  Pertinent labs & imaging results that were available during my care of the patient were reviewed by me and considered in my medical decision making (see chart for details).     Bilateral pneumonia, covid positive with symptoms onset 1/12. Afebrile. Dyspnea on exertion. Resting O2 stable at 94-95%. desats with activity to 88% and feels shortness of breath. Question if she can receive outpatient vs inpatient treatment, as she does seem to be improving overall.  Recommend further evaluation and treatment for this determination in the ER.  Patient husband will drive her there now. Patient verbalized understanding and agreeable to plan.   Final Clinical Impressions(s) / UC Diagnoses   Final diagnoses:  Pneumonia due to COVID-19 virus     Discharge Instructions     Your chest xray is consistent with pneumonia related to covid-19.   More concerning is the worsening of your oxygen when you are active. I would like you to go to the ER for further evaluation and monitoring to determine if you can be managed still at home or need treatment in the hospital.     ED Prescriptions    None     PDMP not reviewed this encounter.   Zigmund Gottron, NP 03/05/19 1310

## 2019-03-05 NOTE — ED Notes (Signed)
[facility] called 22:50

## 2019-03-05 NOTE — ED Triage Notes (Signed)
Pt states she tested positive for covid on the 16th. C.o diarrhea, weakness, cough.

## 2019-03-05 NOTE — ED Provider Notes (Signed)
[facility] DEPART[facility] Provider Note   CSN: [id number] Arrival date & time: 03/05/19  1336     History No chief complaint on file.   Ruth [name] is a 59 y.o. female presenting for evaluation of shortness of breath.  Patient states she developed Covid 10 days ago.  She tested positive on 16th.  Since then, she has had persistent shortness of breath.  This is worse with exertion.  Patient states she has a pulse ox at home, and yesterday it went down into the 70s when she was ambulating.  She reports associated nausea without vomiting.  Patient states initially she was having mild body aches, but these have resolved.  She denies chest pain, abdominal pain, urinary symptoms.  Patient has associated diarrhea.  Patient states her husband is also Covid positive.  She has been taking vitamin C and elderberry without improvement of symptoms.  She has not taken anything else.  She denies tobacco use.  Additional history obtained from chart review.  Patient with a history of anemia, diabetes, GERD, hypertension, hyperlipidemia, obesity.  She was seen at urgent care today where she was found to have bilateral pneumonia, likely due to Covid and desaturation with ambulation.  HPI     Past Medical History:  Diagnosis Date  . Anemia   . Arthritis   . Back pain   . Bilateral calcaneal spurs   . Diabetes mellitus without complication ([place])    type 2  . GERD (gastroesophageal reflux disease)   . Gout   . Hyperlipidemia   . Hypertension   . Obesity   . Osteoarthritis   . Palpitations   . Swallowing difficulty   . Vitamin D deficiency     Patient Active Problem List   Diagnosis Date Noted  . Vitamin D deficiency 11/04/2018  . Class 3 severe obesity with serious comorbidity and body mass index (BMI) of 50.0 to 59.9 in adult ([place]) 10/28/2018  . Drug-induced constipation 10/26/2018  . Breast mass, right 12/18/2017  . Chronic bilateral low back pain with  right-sided sciatica 10/31/2017  . Spinal stenosis of lumbosacral region 10/31/2017  . DDD (degenerative disc disease), lumbosacral 10/31/2017  . Controlled substance agreement signed 10/31/2017  . Acute right-sided low back pain with right-sided sciatica 08/11/2017  . Hematochezia   . Benign neoplasm of colon   . Benign neoplasm of transverse colon   . Benign neoplasm of descending colon   . Benign neoplasm of rectum   . Lumbar paraspinal muscle spasm 07/31/2016  . DM (diabetes mellitus) ([place]) 04/29/2016  . HTN (hypertension), benign 04/29/2016  . Hyperlipidemia 04/29/2016  . Morbid obesity with BMI of 50.0-59.9, adult ([place]) 04/29/2016    Past Surgical History:  Procedure Laterality Date  . ABDOMINAL HYSTERECTOMY     partial  . CHOLECYSTECTOMY    . COLONOSCOPY WITH PROPOFOL N/A 10/29/2016   Procedure: COLONOSCOPY WITH PROPOFOL;  Surgeon: Yetta Flock, MD;  Location: [facility];  Service: Gastroenterology;  Laterality: N/A;  . DENTAL SURGERY    . ORIF TIBIA & FIBULA FRACTURES Left      OB History    Gravida  5   Para  3   Term      Preterm      AB      Living        SAB      TAB      Ectopic      Multiple      Live  Births              Family History  Problem Relation Age of Onset  . Diabetes Mother   . Stroke Mother   . Dementia Mother   . Hypertension Mother   . Eating disorder Mother   . Obesity Mother   . Pancreatic cancer Father   . Sudden death Father   . Alcoholism Father   . Colon polyps Sister   . Kidney disease Sister   . Cancer Maternal Grandmother        type unknown    Social History   Tobacco Use  . Smoking status: Former Smoker    Packs/day: 1.00    Years: 20.00    Pack years: 20.00    Quit date: 02/12/2008    Years since quitting: 11.0  . Smokeless tobacco: Never Used  Substance Use Topics  . Alcohol use: No  . Drug use: No    Home Medications Prior to Admission medications   Medication Sig Start Date End  Date Taking? Authorizing Provider  amLODipine (NORVASC) 5 MG tablet Take 1 tablet (5 mg total) by mouth at bedtime. 03/04/19   Nche, Charlene Brooke, NP  atenolol (TENORMIN) 50 MG tablet Take 1 tablet (50 mg total) by mouth daily. 08/24/18   Nche, Charlene Brooke, NP  atorvastatin (LIPITOR) 40 MG tablet Take 1 tablet (40 mg total) by mouth daily at 6 PM. 02/15/19   Nche, Charlene Brooke, NP  buPROPion (WELLBUTRIN SR) 150 MG 12 hr tablet Take 1 tablet (150 mg total) by mouth daily. 02/16/19   Jearld Lesch A, DO  glipiZIDE (GLUCOTROL XL) 5 MG 24 hr tablet Take 1 tablet (5 mg total) by mouth daily with breakfast. With heaviest meals 02/11/19   Nche, Charlene Brooke, NP  glucose blood test strip Check blood glucose before breakfast and at bedtime. Accu Chek test strips 02/19/19   Nche, Charlene Brooke, NP  latanoprost (XALATAN) 0.005 % ophthalmic solution Place 1 drop into both eyes at bedtime.  10/17/17   [provider]  metFORMIN (GLUCOPHAGE) 850 MG tablet Take 1 tablet (850 mg total) by mouth 2 (two) times daily with a meal. 02/11/19   Nche, Charlene Brooke, NP  Multiple Vitamins-Minerals (MULTIVITAMIN WITH MINERALS) tablet Take 1 tablet by mouth daily.    [provider]  traMADol (ULTRAM) 50 MG tablet Take by mouth every 6 (six) hours as needed.    [provider]  Vitamin D, Ergocalciferol, (DRISDOL) 1.25 MG (50000 UT) CAPS capsule Take 1 capsule (50,000 Units total) by mouth every 7 (seven) days. 01/26/19   Jearld Lesch A, DO    Allergies    Lisinopril and Tape  Review of Systems   Review of Systems  Respiratory: Positive for shortness of breath.   Gastrointestinal: Positive for diarrhea and nausea.  All other systems reviewed and are negative.   Physical Exam Updated Vital Signs BP (!) 145/91   Pulse 79   Temp 98.2 F (36.8 C) (Oral)   Resp (!) 26   SpO2 98%   Physical Exam Vitals and nursing note reviewed.  Constitutional:      General: She is not in acute distress.     Appearance: She is well-developed.     Comments: Obese female resting comfortably in no acute distress  HENT:     Head: Normocephalic and atraumatic.  Eyes:     Extraocular Movements: Extraocular movements intact.     Conjunctiva/sclera: Conjunctivae normal.     Pupils:  Pupils are equal, round, and reactive to light.  Cardiovascular:     Rate and Rhythm: Normal rate and regular rhythm.     Pulses: Normal pulses.  Pulmonary:     Effort: Pulmonary effort is normal. No respiratory distress.     Breath sounds: No wheezing.     Comments: Resting comfortably on 2 L via nasal cannula.  Speaking in full sentences.  Distant lung sounds, likely due to body habitus. Abdominal:     General: There is no distension.     Palpations: Abdomen is soft. There is no mass.     Tenderness: There is no abdominal tenderness. There is no guarding or rebound.  Musculoskeletal:        General: Normal range of motion.     Cervical back: Normal range of motion and neck supple.     Right lower leg: No edema.     Left lower leg: No edema.  Skin:    General: Skin is warm and dry.     Capillary Refill: Capillary refill takes less than 2 seconds.  Neurological:     Mental Status: She is alert and oriented to person, place, and time.     ED Results / Procedures / Treatments   Labs (all labs ordered are listed, but only abnormal results are displayed) Labs Reviewed  C-REACTIVE PROTEIN - Abnormal; Notable for the following components:      Result Value   CRP 14.6 (*)    All other components within normal limits  COMPREHENSIVE METABOLIC PANEL - Abnormal; Notable for the following components:   Sodium 134 (*)    Glucose, Bld 227 (*)    Calcium 8.8 (*)    Albumin 3.1 (*)    AST 49 (*)    ALT 51 (*)    All other components within normal limits  LACTATE DEHYDROGENASE - Abnormal; Notable for the following components:   LDH 335 (*)    All other components within normal limits  D-DIMER, QUANTITATIVE (NOT AT  [facility]) - Abnormal; Notable for the following components:   D-Dimer, Quant 0.92 (*)    All other components within normal limits  FIBRINOGEN - Abnormal; Notable for the following components:   Fibrinogen 788 (*)    All other components within normal limits  CBG MONITORING, ED - Abnormal; Notable for the following components:   Glucose-Capillary 210 (*)    All other components within normal limits  CULTURE, BLOOD (ROUTINE X 2)  CULTURE, BLOOD (ROUTINE X 2)  RESPIRATORY PANEL BY RT PCR (FLU A&B, COVID)  LACTIC ACID, PLASMA  LACTIC ACID, PLASMA  CBC WITH DIFFERENTIAL/PLATELET  I-STAT BETA HCG BLOOD, ED ([facility], [facility], [facility] ONLY)  TROPONIN I (HIGH SENSITIVITY)    EKG None  Radiology DG Chest 2 View  Result Date: 03/05/2019 CLINICAL DATA:  COVID-19 positive patient with shortness of breath and cough. EXAM: CHEST - 2 VIEW COMPARISON:  PA and lateral chest 10/27/2017. FINDINGS: Hazy multifocal airspace disease consistent with pneumonia is seen. No pneumothorax or pleural fluid. Heart size is upper normal. No acute or focal bony abnormality. IMPRESSION: Extensive hazy bilateral airspace disease consistent with pneumonia. Electronically Signed   By: Inge Rise M.D.   On: 03/05/2019 12:46    Procedures Procedures (including critical care time)  Medications Ordered in ED Medications  ondansetron (ZOFRAN) injection 4 mg (4 mg Intravenous Given 03/05/19 1708)    ED Course  I have reviewed the triage vital signs and the nursing notes.  Pertinent labs &  imaging results that were available during my care of the patient were reviewed by me and considered in my medical decision making (see chart for details).    MDM Rules/Calculators/A&P                      Patient presenting for evaluation of shortness of breath.  Physical exam shows patient appears nontoxic.  Patient is on oxygen for comfort.  Her O2 drops with ambulation, which is concerning in the setting of a Covid diagnosis.  She will  likely need to be admitted for this.  X-ray obtained from urgent care earlier shows bilateral pneumonia, likely due to Covid.  Will hold on antibiotics at this time.  Will obtain labs and EKG.  Labs consistent with Covid.  No leukocytosis, but there is elevation of inflammatory markers.  EKG normal sinus rhythm.  Will call for admission.  Discussed with Dr. Roel Cluck from [facility], patient to be admitted.  Final Clinical Impression(s) / ED Diagnoses Final diagnoses:  COVID-19  Pneumonia due to COVID-19 virus    Rx / DC Orders ED Discharge Orders    None       Franchot Heidelberg, PA-C 03/05/19 2003    Malvin Johns, MD 03/05/19 2056

## 2019-03-05 NOTE — ED Notes (Signed)
Ruth [name] (Daughter#[phone]) called for updates/would like to come visit once in a room.

## 2019-03-05 NOTE — Patient Instructions (Signed)
Advised Ruth [name] to go to ED or [facility] for face to face evaluation. She verbalized understanding and agreed to go. She states her husband will drive.

## 2019-03-05 NOTE — Discharge Instructions (Signed)
Your chest xray is consistent with pneumonia related to covid-19.   More concerning is the worsening of your oxygen when you are active. I would like you to go to the ER for further evaluation and monitoring to determine if you can be managed still at home or need treatment in the hospital.

## 2019-03-05 NOTE — H&P (Addendum)
Ruth [name] [id number] DOB: 04/23/1960 DOA: 03/05/2019     PCP: Flossie Buffy, NP   Outpatient Specialists:      Patient arrived to ER on 03/05/19 at 1336  Patient coming from: home Lives   With family    Chief Complaint:  Hypoxia  HPI: Ruth [name] is a 59 y.o. female with medical history significant of Covid positive on 27 February 2019, anemia, arthritis, diabetes mellitus type 2, GERD, HLD, HTN, obesity    Presented with   Shortness of breath and hypoxia at home [place]  She and her husband also positive and symptomatic today presented to urgent care found to have oxygen saturation 88 to 92% at rest fluctuated as low 77..  Patient been sick for the past 7 days she has cough productive of sputum and chest pain chills fevers headaches myalgias nasal congestion sore throat shortness of breath and night sweats.  She tried an over-the-counter cough suppressant was not seem to help.  She also have had diarrhea for the past 2 to 3 days and nausea.  She works but was on short term disability Her Grandson came to visit and fiance She tested positive too but had no symptoms  Infectious risk factors:  Reports fever, shortness of breath, dry cough, chest pain, Sore throat,  anosmia/change in taste, N/V/Diarrhea/abdominal pain,  Body aches, severe fatigue     KNOWN COVID POSITIVE   In  ER   PCR  COVID TEST   POSITIVE,     Lab Results  Component Value Date   SARSCOV2NAA POSITIVE (A) 03/05/2019     Regarding pertinent Chronic problems:     Hyperlipidemia - on statins Lipitor   HTN on Norvasc atenolol     DM 2 -  Lab Results  Component Value Date   HGBA1C 11.6 (H) 02/10/2019   on   PO meds only      Morbid obesity-   BMI Readings from Last 1 Encounters:  03/05/19 57.33 kg/m        While in ER: Found to have bilateral pneumonia    The following Work up has been ordered so far:  Orders Placed This Encounter  Procedures  . Blood Culture (routine  x 2)  . Lactic acid, plasma  . CBC WITH DIFFERENTIAL  . C-reactive protein  . Comprehensive metabolic panel  . Lactate dehydrogenase  . D-dimer, quantitative (not at [facility])  . Fibrinogen  . Cardiac monitoring  . Insert peripheral IV x 2  . Initiate Carrier Fluid Protocol  . Place surgical mask on patient  . Patient to wear surgical mask during transportation  . Assess patient for ability to self-prone. If [place] (can move self in bed, ambulate) and stable (SpO2 and oxygen requirement):  . RN/NT - Document specific oxygen requirements in CHL  . Notify EDP if new oxygen requirements escalates > 4L per minute [place]  . RN to draw the following extra tubes:  . Consult to hospitalist  ALL PATIENTS BEING ADMITTED/HAVING PROCEDURES NEED COVID-19 SCREENING  . Airborne and Contact precautions  . Pulse oximetry, continuous  . I-Stat beta hCG blood, ED  . CBG monitoring, ED  . ED EKG 12-Lead  . EKG 12-Lead     Following Medications were ordered in ER: Medications  ondansetron (ZOFRAN) injection 4 mg (4 mg Intravenous Given 03/05/19 1708)        Consult Orders  (From admission, onward)         Start  Ordered   03/05/19 1918  Consult to hospitalist  ALL PATIENTS BEING ADMITTED/HAVING PROCEDURES NEED COVID-19 SCREENING Paged [facility], roxanne  Once    Comments: ALL PATIENTS BEING ADMITTED/HAVING PROCEDURES NEED COVID-19 SCREENING  Provider:  (Not yet assigned)  Question Answer Comment  Place call to: [facility]   Reason for Consult Admit      03/05/19 1917           Significant initial  Findings: Abnormal Labs Reviewed  C-REACTIVE PROTEIN - Abnormal; Notable for the following components:      Result Value   CRP 14.6 (*)    All other components within normal limits  COMPREHENSIVE METABOLIC PANEL - Abnormal; Notable for the following components:   Sodium 134 (*)    Glucose, Bld 227 (*)    Calcium 8.8 (*)    Albumin 3.1 (*)    AST 49 (*)    ALT 51 (*)    All other  components within normal limits  LACTATE DEHYDROGENASE - Abnormal; Notable for the following components:   LDH 335 (*)    All other components within normal limits  D-DIMER, QUANTITATIVE (NOT AT [facility]) - Abnormal; Notable for the following components:   D-Dimer, Quant 0.92 (*)    All other components within normal limits  FIBRINOGEN - Abnormal; Notable for the following components:   Fibrinogen 788 (*)    All other components within normal limits  CBG MONITORING, ED - Abnormal; Notable for the following components:   Glucose-Capillary 210 (*)    All other components within normal limits    Otherwise labs showing:   Recent Labs  Lab 03/05/19 1815  NA 134*  K 4.7  CO2 24  GLUCOSE 227*  BUN 10  CREATININE 0.84  CALCIUM 8.8*    Cr    stable,   Lab Results  Component Value Date   CREATININE 0.84 03/05/2019   CREATININE 0.78 02/10/2019   CREATININE 0.84 08/21/2018    Recent Labs  Lab 03/05/19 1815  AST 49*  ALT 51*  ALKPHOS 94  BILITOT 1.1  PROT 6.9  ALBUMIN 3.1*   Lab Results  Component Value Date   CALCIUM 8.8 (L) 03/05/2019      WBC      Component Value Date/Time   WBC 5.7 03/05/2019 1702   ANC    Component Value Date/Time   NEUTROABS 4.2 03/05/2019 1702   ALC No components found for: LYMPHAB    Plt: Lab Results  Component Value Date   PLT 225 03/05/2019     Lactic Acid, Venous    Component Value Date/Time   LATICACIDVEN 1.4 03/05/2019 1827    Procalcitonin    Ordered   COVID-19 Labs  Recent Labs    03/05/19 1815 03/05/19 1850  DDIMER  --  0.92*  LDH 335*  --   CRP 14.6*  --     No results found for: SARSCOV2NAA   HG/HCT   stable,      Component Value Date/Time   HGB 13.6 03/05/2019 1702   HGB 12.6 08/22/2017 0931   HCT 42.3 03/05/2019 1702   HCT 39.5 08/22/2017 0931      Troponin 3      ECG: Ordered     DM  labs:  HbA1C: Recent Labs    08/21/18 1401 02/10/19 1147  HGBA1C 8.3* 11.6*       CBG (last 3)    Recent Labs    03/05/19 1925  GLUCAP 210*  UA not ordered      Ordered    CXR - bilateral PNA      ED Triage Vitals [03/05/19 1355]  Enc Vitals Group     BP (!) 133/91     Pulse Rate 82     Resp (!) 22     Temp 98.2 F (36.8 C)     Temp Source Oral     SpO2 91 %     Weight      Height      Head Circumference      Peak Flow      Pain Score      Pain Loc      Pain Edu?      Excl. in [place]?   [id number]       Latest  Blood pressure (!) 145/91, pulse 79, temperature 98.2 F (36.8 C), temperature source Oral, resp. rate (!) 26, SpO2 98 %.    Hospitalist was called for admission for COVID PNA   Review of Systems:    Pertinent positives include:Fevers, chills, fatigue,    Constitutional:  No weight loss, night sweats, eight loss  HEENT:  No headaches, Difficulty swallowing,Tooth/dental problems,Sore throat,  No sneezing, itching, ear ache, nasal congestion, post nasal drip,  Cardio-vascular:  No chest pain, Orthopnea, PND, anasarca, dizziness, palpitations.no Bilateral lower extremity swelling  GI:  No heartburn, indigestion, abdominal pain, nausea, vomiting, diarrhea, change in bowel habits, loss of appetite, melena, blood in stool, hematemesis Resp:  no shortness of breath at rest. No dyspnea on exertion, No excess mucus, no productive cough, No non-productive cough, No coughing up of blood.No change in color of mucus.No wheezing. Skin:  no rash or lesions. No jaundice GU:  no dysuria, change in color of urine, no urgency or frequency. No straining to urinate.  No flank pain.  Musculoskeletal:  No joint pain or no joint swelling. No decreased range of motion. No back pain.  Psych:  No change in mood or affect. No depression or anxiety. No memory loss.  Neuro: no localizing neurological complaints, no tingling, no weakness, no double vision, no gait abnormality, no slurred speech, no confusion  All systems reviewed and apart from [place] all are  negative  Past Medical History:   Past Medical History:  Diagnosis Date  . Anemia   . Arthritis   . Back pain   . Bilateral calcaneal spurs   . Diabetes mellitus without complication ([place])    type 2  . GERD (gastroesophageal reflux disease)   . Gout   . Hyperlipidemia   . Hypertension   . Obesity   . Osteoarthritis   . Palpitations   . Swallowing difficulty   . Vitamin D deficiency       Past Surgical History:  Procedure Laterality Date  . ABDOMINAL HYSTERECTOMY     partial  . CHOLECYSTECTOMY    . COLONOSCOPY WITH PROPOFOL N/A 10/29/2016   Procedure: COLONOSCOPY WITH PROPOFOL;  Surgeon: Yetta Flock, MD;  Location: [facility];  Service: Gastroenterology;  Laterality: N/A;  . DENTAL SURGERY    . ORIF TIBIA & FIBULA FRACTURES Left     Social History:  Ambulatory   Independently    reports that she quit smoking about 11 years ago. She has a 20.00 pack-year smoking history. She has never used smokeless tobacco. She reports that she does not drink alcohol or use drugs.   Family History:   Family History  Problem Relation Age of Onset  . Diabetes  Mother   . Stroke Mother   . Dementia Mother   . Hypertension Mother   . Eating disorder Mother   . Obesity Mother   . Pancreatic cancer Father   . Sudden death Father   . Alcoholism Father   . Colon polyps Sister   . Kidney disease Sister   . Cancer Maternal Grandmother        type unknown    Allergies: Allergies  Allergen Reactions  . Lisinopril Swelling  . Tape Other (See Comments)    Certain Band aids cause skin irritation     Prior to Admission medications   Medication Sig Start Date End Date Taking? Authorizing Provider  amLODipine (NORVASC) 5 MG tablet Take 1 tablet (5 mg total) by mouth at bedtime. 03/04/19   Nche, Charlene Brooke, NP  atenolol (TENORMIN) 50 MG tablet Take 1 tablet (50 mg total) by mouth daily. 08/24/18   Nche, Charlene Brooke, NP  atorvastatin (LIPITOR) 40 MG tablet Take 1 tablet  (40 mg total) by mouth daily at 6 PM. 02/15/19   Nche, Charlene Brooke, NP  buPROPion (WELLBUTRIN SR) 150 MG 12 hr tablet Take 1 tablet (150 mg total) by mouth daily. 02/16/19   Jearld Lesch A, DO  glipiZIDE (GLUCOTROL XL) 5 MG 24 hr tablet Take 1 tablet (5 mg total) by mouth daily with breakfast. With heaviest meals 02/11/19   Nche, Charlene Brooke, NP  glucose blood test strip Check blood glucose before breakfast and at bedtime. Accu Chek test strips 02/19/19   Nche, Charlene Brooke, NP  latanoprost (XALATAN) 0.005 % ophthalmic solution Place 1 drop into both eyes at bedtime.  10/17/17   [provider]  metFORMIN (GLUCOPHAGE) 850 MG tablet Take 1 tablet (850 mg total) by mouth 2 (two) times daily with a meal. 02/11/19   Nche, Charlene Brooke, NP  Multiple Vitamins-Minerals (MULTIVITAMIN WITH MINERALS) tablet Take 1 tablet by mouth daily.    [provider]  traMADol (ULTRAM) 50 MG tablet Take by mouth every 6 (six) hours as needed.    [provider]  Vitamin D, Ergocalciferol, (DRISDOL) 1.25 MG (50000 UT) CAPS capsule Take 1 capsule (50,000 Units total) by mouth every 7 (seven) days. 01/26/19   Georgia Lopes, DO   Physical Exam: Blood pressure (!) 145/91, pulse 79, temperature 98.2 F (36.8 C), temperature source Oral, resp. rate (!) 26, SpO2 98 %. 1. General:  in  Acute distress increased work of breathing    Chronically ill  -appearing 2. Psychological: Alert and  Oriented 3. Head/ENT:    Dry Mucous Membranes                          Head Non traumatic, neck supple                           Poor Dentition 4. SKIN:  decreased Skin turgor,  Skin clean Dry and intact no rash 5. Heart: Regular rate and rhythm no  Murmur, no Rub or gallop 6. Lungs:   no wheezes or crackles   7. Abdomen: Soft, non-tender, Non distended  bowel sounds present 8. Lower extremities: no clubbing, cyanosis, no  edema 9. Neurologically Grossly intact, moving all 4 extremities equally   10. MSK: Normal  range of motion   All other LABS:     Recent Labs  Lab 03/05/19 1702  WBC 5.7  NEUTROABS 4.2  HGB 13.6  HCT 42.3  MCV 91.0  PLT 225     Recent Labs  Lab 03/05/19 1815  NA 134*  K 4.7  CL 100  CO2 24  GLUCOSE 227*  BUN 10  CREATININE 0.84  CALCIUM 8.8*     Recent Labs  Lab 03/05/19 1815  AST 49*  ALT 51*  ALKPHOS 94  BILITOT 1.1  PROT 6.9  ALBUMIN 3.1*       Cultures: No results found for: SDES, SPECREQUEST, CULT, REPTSTATUS   Radiological Exams on Admission: DG Chest 2 View  Result Date: 03/05/2019 CLINICAL DATA:  COVID-19 positive patient with shortness of breath and cough. EXAM: CHEST - 2 VIEW COMPARISON:  PA and lateral chest 10/27/2017. FINDINGS: Hazy multifocal airspace disease consistent with pneumonia is seen. No pneumothorax or pleural fluid. Heart size is upper normal. No acute or focal bony abnormality. IMPRESSION: Extensive hazy bilateral airspace disease consistent with pneumonia. Electronically Signed   By: Inge Rise M.D.   On: 03/05/2019 12:46    Chart has been reviewed    Assessment/Plan   60 y.o. female with medical history significant of Covid positive on 27 February 2019, anemia, arthritis, diabetes mellitus type 2, GERD, HLD, HTN, obesity  Admitted for Pneumonia due COVID  Present on Admission: . COVID-19 virus infection -   FROM HOME WITH KNOWN HX OF COVID19 Repeat ER Novel Corona Virus testing: Ordered 03/05/19 and is  positive   Following concerning LAB/ imaging findings:    BMP: increased BUN/Cr  LFTs: increased AST/ALT  CRP, LDH: increased   IL-6 and Ferritin increased    CXR: hazy bilateral peripheral opacities       Following complications noted:   elevated LFT's likely in the setting of COVID continue to follow   nausea/vomiting Diarrhea , decreased PO intake resulting in dehydration - will rehydrate  acute respiratory failure with hypoxia - continue oxygen treatment  Plan of treatment: - Transfer to [facility] facility if   bed is available  -given severity of illness initiate steroids Decadron 6mg  q 24 hours And pharmacy consult for remdesivir -  IF Hypoxia requiring high nasal flow   and no contraindications would attempt a trial of Actemra  - Will follow daily d.dimer - Assess for ability to prone  - Supportive management -Fluid sparing resuscitation  -Provide oxygen as needed currently on  SpO2: 97 % O2 Flow Rate (L/min): 3 L/min - IF d.dimer elvated >5 will increase dose of lovenox    Poor Prognostic factors  59 y.o.  Personal hx of  HTN, obesity      Will order Airborne and Contact precautions    patient prognosis discussion: I have discussed case with the  patient  who are aware of their prognosis At this point they would like  to be full code     DM 2-  - Order moderate  SSI     -  check TSH and HgA1C  - Hold by mouth medications     . HTN (hypertension), benign chronic stable continue home medication  . Hyperlipidemia -stable continue home medication  . DDD (degenerative disc disease), lumbosacral stable  Obesity will need outpatient follow-up with nutritionist  Other plan as per orders.  DVT prophylaxis:    Lovenox     Code Status:  FULL CODE as per patient   I had personally discussed CODE STATUS with patient    Family Communication:   Family not at  Bedside    Disposition Plan:  To home once workup is complete and patient is stable                       Consults called: none   Admission status:  ED Disposition    ED Disposition Condition Comment   Admit  The patient appears reasonably stabilized for admission considering the current resources, flow, and capabilities available in the ED at this time, and I doubt any other [facility] requiring further screening and/or treatment in the ED prior to admission is  present.         inpatient     Expect 2 midnight stay secondary to severity of patient's current illness including   hemodynamic instability  despite optimal treatment ( hypoxia )   Severe lab/radiological/exam abnormalities including:   Covid positive pneumonia  and extensive comorbidities including:  Morbid Obesity   That are currently affecting medical management.   I expect  patient to be hospitalized for 2 midnights requiring inpatient medical care.  Patient is at high risk for adverse outcome (such as loss of life or disability) if not treated.  Indication for inpatient stay as follows:    Hemodynamic instability despite maximal medical therapy,    New or worsening hypoxia  Need for IV antivirals, IV fluids IV steroids   Level of care    tele  For  24H        Precautions: admitted as covid positive Airborne and Contact precautions   PPE: Used by the provider:   P100  eye Goggles,  Gloves  gown      Aubert Choyce 03/05/2019, 8:45 PM    [facility]     after 2 AM please page floor coverage PA If 7AM-7PM, please contact the day team taking care of the patient using [email or web address]   Patient was evaluated in the context of the global COVID-19 pandemic, which necessitated consideration that the patient might be at risk for infection with the SARS-CoV-2 virus that causes COVID-19. Institutional protocols and algorithms that pertain to the evaluation of patients at risk for COVID-19 are in a state of rapid change based on information released by regulatory bodies including the CDC and federal and state organizations. These policies and algorithms were followed during the patient's care.

## 2019-03-05 NOTE — ED Notes (Signed)
Tele

## 2019-03-05 NOTE — ED Triage Notes (Signed)
Pt sent to the ED for PNA ,from [facility]  pt was dx with covid on the 16th and started to get more sob today

## 2019-03-06 ENCOUNTER — Inpatient Hospital Stay: Payer: Self-pay

## 2019-03-06 DIAGNOSIS — E1165 Type 2 diabetes mellitus with hyperglycemia: Secondary | ICD-10-CM

## 2019-03-06 DIAGNOSIS — J9601 Acute respiratory failure with hypoxia: Secondary | ICD-10-CM

## 2019-03-06 LAB — CBC WITH DIFFERENTIAL/PLATELET
Abs Immature Granulocytes: 0.04 10*3/uL (ref 0.00–0.07)
Basophils Absolute: 0 10*3/uL (ref 0.0–0.1)
Basophils Relative: 0 %
Eosinophils Absolute: 0 10*3/uL (ref 0.0–0.5)
Eosinophils Relative: 0 %
HCT: 40.3 % (ref 36.0–46.0)
Hemoglobin: 12.9 g/dL (ref 12.0–15.0)
Immature Granulocytes: 1 %
Lymphocytes Relative: 19 %
Lymphs Abs: 1.1 10*3/uL (ref 0.7–4.0)
MCH: 29.1 pg (ref 26.0–34.0)
MCHC: 32 g/dL (ref 30.0–36.0)
MCV: 90.8 fL (ref 80.0–100.0)
Monocytes Absolute: 0.3 10*3/uL (ref 0.1–1.0)
Monocytes Relative: 5 %
Neutro Abs: 4.6 10*3/uL (ref 1.7–7.7)
Neutrophils Relative %: 75 %
Platelets: 216 10*3/uL (ref 150–400)
RBC: 4.44 MIL/uL (ref 3.87–5.11)
RDW: 13.3 % (ref 11.5–15.5)
WBC: 6.1 10*3/uL (ref 4.0–10.5)
nRBC: 0 % (ref 0.0–0.2)

## 2019-03-06 LAB — COMPREHENSIVE METABOLIC PANEL
ALT: 45 U/L — ABNORMAL HIGH (ref 0–44)
AST: 38 U/L (ref 15–41)
Albumin: 3.4 g/dL — ABNORMAL LOW (ref 3.5–5.0)
Alkaline Phosphatase: 90 U/L (ref 38–126)
Anion gap: 10 (ref 5–15)
BUN: 14 mg/dL (ref 6–20)
CO2: 25 mmol/L (ref 22–32)
Calcium: 9 mg/dL (ref 8.9–10.3)
Chloride: 102 mmol/L (ref 98–111)
Creatinine, Ser: 0.55 mg/dL (ref 0.44–1.00)
GFR calc Af Amer: >60 mL/min (ref >60)
GFR calc non Af Amer: >60 mL/min (ref >60)
Glucose, Bld: 270 mg/dL — ABNORMAL HIGH (ref 70–99)
Potassium: 4.3 mmol/L (ref 3.5–5.1)
Sodium: 137 mmol/L (ref 135–145)
Total Bilirubin: 1.1 mg/dL (ref 0.3–1.2)
Total Protein: 7.5 g/dL (ref 6.5–8.1)

## 2019-03-06 LAB — PHOSPHORUS: Phosphorus: 3.6 mg/dL (ref 2.5–4.6)

## 2019-03-06 LAB — ABO/RH: ABO/RH(D): O POS

## 2019-03-06 LAB — D-DIMER, QUANTITATIVE: D-Dimer, Quant: 0.98 ug/mL-FEU — ABNORMAL HIGH (ref 0.00–0.50)

## 2019-03-06 LAB — MAGNESIUM: Magnesium: 2 mg/dL (ref 1.7–2.4)

## 2019-03-06 LAB — FERRITIN: Ferritin: 853 ng/mL — ABNORMAL HIGH (ref 11–307)

## 2019-03-06 LAB — GLUCOSE, CAPILLARY
Glucose-Capillary: 160 mg/dL — ABNORMAL HIGH (ref 70–99)
Glucose-Capillary: 297 mg/dL — ABNORMAL HIGH (ref 70–99)
Glucose-Capillary: 228 mg/dL — ABNORMAL HIGH (ref 70–99)
Glucose-Capillary: 251 mg/dL — ABNORMAL HIGH (ref 70–99)

## 2019-03-06 LAB — HEMOGLOBIN A1C
Hgb A1c MFr Bld: 11.2 % — ABNORMAL HIGH (ref 4.8–5.6)
Mean Plasma Glucose: 274.74 mg/dL

## 2019-03-06 LAB — HIV ANTIBODY (ROUTINE TESTING W REFLEX): HIV Screen 4th Generation wRfx: NONREACTIVE

## 2019-03-06 LAB — PROCALCITONIN: Procalcitonin: <0.1 ng/mL

## 2019-03-06 LAB — C-REACTIVE PROTEIN: CRP: 16.3 mg/dL — ABNORMAL HIGH (ref <1.0)

## 2019-03-06 LAB — TROPONIN I (HIGH SENSITIVITY): Troponin I (High Sensitivity): 2 ng/L (ref <18)

## 2019-03-06 MED ORDER — INSULIN ASPART 100 UNIT/ML ~~LOC~~ SOLN
4.0000 [IU] | Freq: Three times a day (TID) | SUBCUTANEOUS | Status: DC
  Administered 2019-03-06 – 2019-03-09 (×9): 4 [IU] via SUBCUTANEOUS

## 2019-03-06 MED ORDER — INSULIN GLARGINE 100 UNIT/ML ~~LOC~~ SOLN
10.0000 [IU] | Freq: Every day | SUBCUTANEOUS | Status: DC
  Administered 2019-03-06 – 2019-03-07 (×2): 10 [IU] via SUBCUTANEOUS
  Filled 2019-03-06 (×2): qty 0.1

## 2019-03-06 MED ORDER — ENOXAPARIN SODIUM 80 MG/0.8ML ~~LOC~~ SOLN
75.0000 mg | Freq: Every day | SUBCUTANEOUS | Status: DC
  Administered 2019-03-06 – 2019-03-08 (×4): 75 mg via SUBCUTANEOUS
  Filled 2019-03-06 (×4): qty 0.8

## 2019-03-06 MED ORDER — INSULIN ASPART 100 UNIT/ML ~~LOC~~ SOLN
0.0000 [IU] | Freq: Three times a day (TID) | SUBCUTANEOUS | Status: DC
  Administered 2019-03-06: 7 [IU] via SUBCUTANEOUS
  Administered 2019-03-07: 3 [IU] via SUBCUTANEOUS
  Administered 2019-03-07 (×2): 11 [IU] via SUBCUTANEOUS
  Administered 2019-03-08: 15 [IU] via SUBCUTANEOUS
  Administered 2019-03-08: 11 [IU] via SUBCUTANEOUS
  Administered 2019-03-08: 7 [IU] via SUBCUTANEOUS
  Administered 2019-03-09: 11 [IU] via SUBCUTANEOUS
  Administered 2019-03-09: 15 [IU] via SUBCUTANEOUS

## 2019-03-06 MED ORDER — INSULIN ASPART 100 UNIT/ML ~~LOC~~ SOLN
0.0000 [IU] | Freq: Every day | SUBCUTANEOUS | Status: DC
  Administered 2019-03-06: 3 [IU] via SUBCUTANEOUS
  Administered 2019-03-08: 4 [IU] via SUBCUTANEOUS

## 2019-03-06 MED ORDER — GUAIFENESIN-DM 100-10 MG/5ML PO SYRP: 10.0000 mL | ORAL_SOLUTION | ORAL | Status: DC | PRN

## 2019-03-06 MED ORDER — FAMOTIDINE 20 MG PO TABS
20.0000 mg | ORAL_TABLET | Freq: Two times a day (BID) | ORAL | Status: DC
  Administered 2019-03-06 – 2019-03-09 (×7): 20 mg via ORAL
  Filled 2019-03-06 (×6): qty 1

## 2019-03-06 MED ORDER — SODIUM CHLORIDE 0.9% FLUSH
10.0000 mL | Freq: Two times a day (BID) | INTRAVENOUS | Status: DC
  Administered 2019-03-06 – 2019-03-09 (×4): 10 mL

## 2019-03-06 MED ORDER — SODIUM CHLORIDE 0.9% FLUSH
10.0000 mL | INTRAVENOUS | Status: DC | PRN
  Administered 2019-03-07: 10 mL

## 2019-03-06 MED ORDER — SODIUM CHLORIDE 0.9 % IV SOLN: INTRAVENOUS | Status: DC

## 2019-03-06 MED ORDER — ACETAMINOPHEN 325 MG PO TABS: 650.0000 mg | ORAL_TABLET | Freq: Four times a day (QID) | ORAL | Status: DC | PRN

## 2019-03-06 MED ORDER — INSULIN GLARGINE 100 UNIT/ML ~~LOC~~ SOLN
10.0000 [IU] | Freq: Every day | SUBCUTANEOUS | Status: DC
  Filled 2019-03-06: qty 0.1

## 2019-03-06 MED ORDER — AMLODIPINE BESYLATE 5 MG PO TABS
5.0000 mg | ORAL_TABLET | Freq: Every day | ORAL | Status: DC
  Administered 2019-03-06 – 2019-03-09 (×4): 5 mg via ORAL
  Filled 2019-03-06 (×4): qty 1

## 2019-03-06 MED ORDER — PRO-STAT SUGAR FREE PO LIQD
30.0000 mL | Freq: Three times a day (TID) | ORAL | Status: DC
  Administered 2019-03-06 – 2019-03-09 (×3): 30 mL via ORAL
  Filled 2019-03-06 (×4): qty 30

## 2019-03-06 MED ORDER — HYDROCODONE-ACETAMINOPHEN 5-325 MG PO TABS: 1.0000 | ORAL_TABLET | ORAL | Status: DC | PRN

## 2019-03-06 MED ORDER — ZINC SULFATE 220 (50 ZN) MG PO CAPS
220.0000 mg | ORAL_CAPSULE | Freq: Every day | ORAL | Status: DC
  Administered 2019-03-06 – 2019-03-09 (×4): 220 mg via ORAL
  Filled 2019-03-06 (×4): qty 1

## 2019-03-06 MED ORDER — CHLORHEXIDINE GLUCONATE CLOTH 2 % EX PADS
6.0000 | MEDICATED_PAD | Freq: Every day | CUTANEOUS | Status: DC
  Administered 2019-03-06 – 2019-03-09 (×3): 6 via TOPICAL

## 2019-03-06 MED ORDER — ADULT MULTIVITAMIN W/MINERALS CH
1.0000 | ORAL_TABLET | Freq: Every day | ORAL | Status: DC
  Administered 2019-03-06 – 2019-03-09 (×4): 1 via ORAL
  Filled 2019-03-06 (×2): qty 1

## 2019-03-06 MED ORDER — SODIUM CHLORIDE 0.9% FLUSH
3.0000 mL | Freq: Two times a day (BID) | INTRAVENOUS | Status: DC
  Administered 2019-03-06 – 2019-03-09 (×6): 3 mL via INTRAVENOUS

## 2019-03-06 MED ORDER — ONDANSETRON HCL 4 MG PO TABS: 4.0000 mg | ORAL_TABLET | Freq: Four times a day (QID) | ORAL | Status: DC | PRN

## 2019-03-06 MED ORDER — ONDANSETRON HCL 4 MG/2ML IJ SOLN: 4.0000 mg | Freq: Four times a day (QID) | INTRAMUSCULAR | Status: DC | PRN

## 2019-03-06 MED ORDER — ASCORBIC ACID 500 MG PO TABS
500.0000 mg | ORAL_TABLET | Freq: Every day | ORAL | Status: DC
  Administered 2019-03-06 – 2019-03-09 (×4): 500 mg via ORAL
  Filled 2019-03-06 (×4): qty 1

## 2019-03-06 MED ORDER — TRAMADOL HCL 50 MG PO TABS: 50.0000 mg | ORAL_TABLET | Freq: Four times a day (QID) | ORAL | Status: DC | PRN

## 2019-03-06 MED ORDER — ATENOLOL 50 MG PO TABS
50.0000 mg | ORAL_TABLET | Freq: Every day | ORAL | Status: DC
  Administered 2019-03-06 – 2019-03-09 (×4): 50 mg via ORAL
  Filled 2019-03-06 (×4): qty 1

## 2019-03-06 MED ORDER — SODIUM CHLORIDE 0.9% IV SOLUTION: Freq: Once | INTRAVENOUS | Status: AC

## 2019-03-06 MED ORDER — ATORVASTATIN CALCIUM 40 MG PO TABS
40.0000 mg | ORAL_TABLET | Freq: Every day | ORAL | Status: DC
  Administered 2019-03-06 – 2019-03-08 (×3): 40 mg via ORAL
  Filled 2019-03-06 (×3): qty 1

## 2019-03-06 NOTE — Progress Notes (Signed)
Pt arrived to unit by transport via stretcher. On 3L [address]. Skin swarm completed w/ Camillia Herter, RN. Redness under breast noted. Pt given CHG bath. Placed on monitor. VS and FSBS taken. Admission and assessment completed. MD made aware of pt's arrival. Bed low to ground. Call light in reach. Pt instructed to call out for assistance when needed. Pt is not in any acute distress at this time, Will continue to monitor.

## 2019-03-06 NOTE — Progress Notes (Signed)
PROGRESS NOTE  Ruth [name] [id number] DOB: 16-Jul-1960 DOA: 03/05/2019  PCP: Flossie Buffy, NP  Brief History/Interval Summary: Ruth [name] is a 59 y.o. female with medical history significant of Covid positive on 27 February 2019, anemia, arthritis, diabetes mellitus type 2, GERD, HLD, HTN, obesity.  She presented with shortness of breath.  Noted to be hypoxic.  Was hospitalized for further management.  Reason for Visit: Pneumonia due to COVID-19.  Acute respiratory failure with hypoxia.  Consultants: None  Procedures: None  Antibiotics: Anti-infectives (From admission, onward)   Start     Dose/Rate Route Frequency Ordered Stop   03/06/19 2200  remdesivir 100 mg in sodium chloride 0.9 % 100 mL IVPB     100 mg 200 mL/hr over 30 Minutes Intravenous Daily 03/05/19 2252 03/10/19 2159   03/05/19 2300  remdesivir 200 mg in sodium chloride 0.9% 250 mL IVPB     200 mg 580 mL/hr over 30 Minutes Intravenous Once 03/05/19 2252 03/06/19 0142      Subjective/Interval History: Patient continues to have shortness of breath.  Dry cough.  Occasional chest pain especially when she coughs.  Some nausea but no vomiting.  No diarrhea.  Passing urine.  Generalized weakness.   Assessment/Plan:  Acute Hypoxic Resp. Failure/Pneumonia due to COVID-19   Recent Labs  Lab 03/05/19 1815 03/05/19 1850 03/06/19 0414  DDIMER  --  0.92* 0.98*  FERRITIN  --   --  853*  CRP 14.6*  --  16.3*  ALT 51*  --  45*  PROCALCITON  --   --  <0.10    Objective findings: Fever: Afebrile Oxygen requirements: Nasal cannula 3 L.  Saturating in the early 90s.  COVID 19 Therapeutics: Antibacterials: None Remdesivir: Day 1 Steroids: Dexamethasone 6 mg daily Diuretics: None Actemra: Not given Convalescent Plasma: Ordered 1 unit today Vitamin C and Zinc: Continue PUD Prophylaxis: Initiate Pepcid DVT Prophylaxis:  Lovenox 75 mg daily  Patient's remained stable from a respiratory standpoint.  She  remains on remdesivir and steroids.  Inflammatory markers noted to be elevated with a CRP of 16.3.  D-dimer only minimally elevated.  Procalcitonin was normal.  Hold off on antibacterials.  Patient was offered convalescent plasma.  She has reviewed the consent forms and has signed it.  1 unit has been ordered.  Since her oxygen requirements are not that high we will hold off on offering Actemra.  Continue with incentive spirometry mobilization.  Prone positioning as much as possible.  She is morbidly obese and is at high risk for decompensation.  Diabetes mellitus type II, uncontrolled with hyperglycemia Elevated CBGs most likely due to steroids.  Continue with SSI.  HbA1c 11.2 implying very poor glycemic control.  Patient on glipizide and Metformin at home.  She will likely need insulin at discharge.  Initiate long-acting insulin.  Essential hypertension On amlodipine atenolol at home.  Continue to monitor blood pressures closely.  Continue these medications in the hospital.  Hyperlipidemia Continue with Lipitor.  History of degenerative disc disease Stable.  Continue to monitor.  PT and OT evaluation.  Morbid obesity Estimated body mass index is 57.33 kg/m as calculated from the following:   Height as of this encounter: 5\' 4"  (1.626 m).   Weight as of 02/16/19: 151.5 kg.   DVT Prophylaxis: Lovenox weight-based Code Status: Full code Family Communication: Discussed with the patient Disposition Plan: Management as outlined above.  Hopefully be [place] to go home after she has improved.   Medications:  Scheduled: . sodium chloride   Intravenous Once  . amLODipine  5 mg Oral Daily  . vitamin C  500 mg Oral Daily  . atenolol  50 mg Oral Daily  . atorvastatin  40 mg Oral q1800  . dexamethasone (DECADRON) injection  6 mg Intravenous Q24H  . enoxaparin (LOVENOX) injection  75 mg Subcutaneous QHS  . feeding supplement (PRO-STAT SUGAR FREE 64)  30 mL Oral TID  . insulin aspart  0-15 Units  Subcutaneous TID WC  . insulin aspart  0-5 Units Subcutaneous QHS  . multivitamin with minerals  1 tablet Oral Daily  . sodium chloride flush  3 mL Intravenous Q12H  . zinc sulfate  220 mg Oral Daily   Continuous: . remdesivir 100 mg in NS 100 mL     [id number], guaiFENesin-dextromethorphan, HYDROcodone-acetaminophen, ondansetron **OR** ondansetron (ZOFRAN) IV, traMADol   Objective:  Vital Signs  Vitals:   03/06/19 0900 03/06/19 1000 03/06/19 1100 03/06/19 1200  BP: (!) 128/97   133/75  Pulse: 86 85 75 73  Resp:      Temp:    98.6 F (37 C)  TempSrc:    Oral  SpO2: 95% 96% 94% 96%  Height:        Intake/Output Summary (Last 24 hours) at 03/06/2019 1349 Last data filed at 03/06/2019 1130 Gross per 24 hour  Intake 610 ml  Output 700 ml  Net -90 ml   There were no vitals filed for this visit.  General appearance: Awake alert.  In no distress.  Morbidly obese Resp: Tachypneic at rest.  Coarse breath sounds with crackles at the bases.  No wheezing or rhonchi.   Cardio: S1-S2 is normal regular.  No S3-S4.  No rubs murmurs or bruit GI: Abdomen is soft.  Nontender nondistended.  Bowel sounds are present normal.  No masses organomegaly Extremities: No edema.  Full range of motion of lower extremities. Neurologic: Alert and oriented x3.  No focal neurological deficits.    Lab Results:  Data Reviewed: I have personally reviewed following labs and imaging studies  CBC: Recent Labs  Lab 03/05/19 1702 03/06/19 0414  WBC 5.7 6.1  NEUTROABS 4.2 4.6  HGB 13.6 12.9  HCT 42.3 40.3  MCV 91.0 90.8  PLT 225 [id number]    Basic Metabolic Panel: Recent Labs  Lab 03/05/19 1815 03/06/19 0414  NA 134* 137  K 4.7 4.3  CL 100 102  CO2 24 25  GLUCOSE 227* 270*  BUN 10 14  CREATININE 0.84 0.55  CALCIUM 8.8* 9.0  MG  --  2.0  PHOS  --  3.6    GFR: CrCl cannot be calculated (Unknown ideal weight.).  Liver Function Tests: Recent Labs  Lab 03/05/19 1815 03/06/19 0414   AST 49* 38  ALT 51* 45*  ALKPHOS 94 90  BILITOT 1.1 1.1  PROT 6.9 7.5  ALBUMIN 3.1* 3.4*      HbA1C: Recent Labs    03/06/19 0414  HGBA1C 11.2*    CBG: Recent Labs  Lab 03/05/19 1925 03/05/19 2154 03/06/19 0115 03/06/19 1108  GLUCAP 210* 186* 160* 297*    Anemia Panel: Recent Labs    03/06/19 0414  FERRITIN 853*    Recent Results (from the past 240 hour(s))  Blood Culture (routine x 2)     Status: None (Preliminary result)   Collection Time: 03/05/19  5:55 PM   Specimen: BLOOD  Result Value Ref Range Status   Specimen Description BLOOD RIGHT ANTECUBITAL  Final  Special Requests   Final    BOTTLES DRAWN AEROBIC AND ANAEROBIC Blood Culture adequate volume   Culture   Final    NO GROWTH < 12 HOURS Performed at [facility], [place] [address] [place], [place] [address]    Report Status PENDING  Incomplete  Respiratory Panel by RT PCR (Flu A&B, Covid) - Nasopharyngeal Swab     Status: Abnormal   Collection Time: 03/05/19  8:32 PM   Specimen: Nasopharyngeal Swab  Result Value Ref Range Status   SARS Coronavirus 2 by RT PCR POSITIVE (A) NEGATIVE Final    Comment: RESULT CALLED TO, READ BACK BY AND VERIFIED WITH: FG RN 03/05/19 2137 JDW (NOTE) SARS-CoV-2 target nucleic acids are DETECTED. SARS-CoV-2 RNA is generally detectable in upper respiratory specimens  during the acute phase of infection. Positive results are indicative of the presence of the identified virus, but do not rule out bacterial infection or co-infection with other pathogens not detected by the test. Clinical correlation with patient history and other diagnostic information is necessary to determine patient infection status. The expected result is Negative. Fact Sheet for Patients:  PinkCheek.be Fact Sheet for Healthcare Providers: GravelBags.it This test is not yet approved or cleared by the [place] FDA and  has been  authorized for detection and/or diagnosis of SARS-CoV-2 by FDA under an Emergency Use Authorization (EUA).  This EUA will remain in effect (meaning this test can be used) for the durati on of  the COVID-19 declaration under Section 564(b)(1) of the Act, 21 U.S.C. section 360bbb-3(b)(1), unless the authorization is terminated or revoked sooner.    Influenza A by PCR NEGATIVE NEGATIVE Final   Influenza B by PCR NEGATIVE NEGATIVE Final    Comment: (NOTE) The Xpert Xpress SARS-CoV-2/FLU/RSV assay is intended as an aid in  the diagnosis of influenza from Nasopharyngeal swab specimens and  should not be used as a sole basis for treatment. Nasal washings and  aspirates are unacceptable for Xpert Xpress SARS-CoV-2/FLU/RSV  testing. Fact Sheet for Patients: PinkCheek.be Fact Sheet for Healthcare Providers: GravelBags.it This test is not yet approved or cleared by the [place] FDA and  has been authorized for detection and/or diagnosis of SARS-CoV-2 by  FDA under an Emergency Use Authorization (EUA). This EUA will remain  in effect (meaning this test can be used) for the duration of the  Covid-19 declaration under Section 564(b)(1) of the Act, 21  U.S.C. section 360bbb-3(b)(1), unless the authorization is  terminated or revoked. Performed at [facility], [place] [address], [place], [place] [address]       Radiology Studies: DG Chest 2 View  Result Date: 03/05/2019 CLINICAL DATA:  COVID-19 positive patient with shortness of breath and cough. EXAM: CHEST - 2 VIEW COMPARISON:  PA and lateral chest 10/27/2017. FINDINGS: Hazy multifocal airspace disease consistent with pneumonia is seen. No pneumothorax or pleural fluid. Heart size is upper normal. No acute or focal bony abnormality. IMPRESSION: Extensive hazy bilateral airspace disease consistent with pneumonia. Electronically Signed   By: Inge Rise M.D.   On: 03/05/2019  12:46   [place] EKG SITE RITE  Result Date: 03/06/2019 If Site Rite image not attached, placement could not be confirmed due to current cardiac rhythm.      LOS: 1 day   Celena Lanius [employer] on [email or web address]  03/06/2019, 1:49 PM

## 2019-03-06 NOTE — Progress Notes (Addendum)
Peripherally Inserted Central Catheter/Midline Placement  The IV Nurse has discussed with the patient and/or persons authorized to consent for the patient, the purpose of this procedure and the potential benefits and risks involved with this procedure.  The benefits include less needle sticks, lab draws from the catheter, and the patient may be discharged home with the catheter. Risks include, but not limited to, infection, bleeding, blood clot (thrombus formation), and puncture of an artery; nerve damage and irregular heartbeat and possibility to perform a PICC exchange if needed/ordered by physician.  Alternatives to this procedure were also discussed.  Bard Power PICC patient education guide, fact sheet on infection prevention and patient information card has been provided to patient /or left at bedside.  PICC place by ECG at tallest p waive. Counter spikes before "p" wave noted throughout placement.  PICC/Midline Placement Documentation  PICC Single Lumen 03/06/19 PICC Right Cephalic 45 cm 1 cm (Active)  Indication for Insertion or Continuance of Line Limited venous access - need for IV therapy >5 days (PICC only) 03/06/19 1706  Exposed Catheter (cm) 1 cm 03/06/19 1706  Site Assessment Clean;Dry;Intact 03/06/19 1706  Line Status Flushed;Saline locked;Blood return noted 03/06/19 1706  Dressing Type Transparent 03/06/19 1706  Dressing Status Clean;Dry;Intact;Antimicrobial disc in place 03/06/19 1706  Dressing Change Due 03/13/19 03/06/19 1706       Gordan Payment 03/06/2019, 5:07 PM

## 2019-03-06 NOTE — Progress Notes (Signed)
Initial Nutrition Assessment  DOCUMENTATION CODES:   Morbid obesity  INTERVENTION:  Vital Cuisine po daily on breakfast tray, each supplement provides 520 kcal and 22 grams of protein   Prostat 30 ml po BID, each supplement provides 100 kcal and 15 grams of protein  MVI with minerals daily  Recommend regular diet to encourage po intake  NUTRITION DIAGNOSIS:   Increased nutrient needs related to catabolic [id number] virus) as evidenced by estimated needs.   GOAL:   Patient will meet greater than or equal to 90% of their needs   MONITOR:   PO intake, Weight trends, Supplement acceptance, Labs, I & O's  REASON FOR ASSESSMENT:   Malnutrition Screening Tool    ASSESSMENT:  RD working remotely.  59 year old female with past medical history of anemia, arthritis, T2DM, GERD, HLD, HTN, COVID-19 positive on 1/16 presented with SOB and hypoxia.  Per chart, pt seen at Urgent Care 1/21 and found to have bilateral pneumonia due to Covid and desaturation with ambulation.   Per flowsheets, SpO2 96% on 2 L/min [place]  Patient eating with poor po intake, per chart 0% x 2 documented meals this admission and is at high risk for malnutrition. Patient provided Ensure twice daily as well as Magic Cup with lunch and dinner meals. Will provide Vital Cuisine on breakfast tray and Prostat to aid with increased calorie/protien needs and recommend regular diet to encourage po intake of meals.   Current wt 151.5 kg (333.3 lbs) Weight history reviewed, stable over the past 2 years.   Medications reviewed and include: Vit C, decadron, SS novolog, Zinc sulfate  Labs: CBGs [id number]   NUTRITION - FOCUSED PHYSICAL EXAM: Unable to complete at this time, RD working remotely.  Diet Order:   Diet Order            Diet Heart Room service appropriate? Yes; Fluid consistency: Thin  Diet effective now              EDUCATION NEEDS:   No education needs have been identified at this  time  Skin:  Skin Assessment: Reviewed RN Assessment  Last BM:  Unknown  Height:   Ht Readings from Last 1 Encounters:  03/06/19 5\' 4"  (1.626 m)    Weight:   Wt Readings from Last 1 Encounters:  02/16/19 (!) 151.5 kg    Ideal Body Weight:  54.5 kg  BMI:  Body mass index is 57.33 kg/m.  Estimated Nutritional Needs:   Kcal:  [id number]  Protein:  136-166  Fluid:  >/= 2.3 L/day   Lajuan Lines, RD, LDN Clinical Nutrition [employer] Telephone [phone] After Hours/Weekend Pager: [phone]

## 2019-03-06 NOTE — Plan of Care (Signed)
  Problem: Education: Goal: Knowledge of risk factors and measures for prevention of condition will improve Outcome: Progressing   Problem: Coping: Goal: Psychosocial and spiritual needs will be supported Outcome: Progressing   Problem: Respiratory: Goal: Will maintain a patent airway Outcome: Progressing Goal: Complications related to the disease process, condition or treatment will be avoided or minimized Outcome: Progressing   

## 2019-03-06 NOTE — ED Notes (Signed)
Report given to [facility] RN. All questions answered

## 2019-03-07 ENCOUNTER — Other Ambulatory Visit: Payer: Self-pay

## 2019-03-07 ENCOUNTER — Encounter (HOSPITAL_COMMUNITY): Payer: Self-pay | Admitting: Internal Medicine

## 2019-03-07 LAB — CBC WITH DIFFERENTIAL/PLATELET
Abs Immature Granulocytes: 0.04 10*3/uL (ref 0.00–0.07)
Basophils Absolute: 0 10*3/uL (ref 0.0–0.1)
Basophils Relative: 0 %
Eosinophils Absolute: 0 10*3/uL (ref 0.0–0.5)
Eosinophils Relative: 0 %
HCT: 39.9 % (ref 36.0–46.0)
Hemoglobin: 12.7 g/dL (ref 12.0–15.0)
Immature Granulocytes: 1 %
Lymphocytes Relative: 29 %
Lymphs Abs: 1.4 10*3/uL (ref 0.7–4.0)
MCH: 28.4 pg (ref 26.0–34.0)
MCHC: 31.8 g/dL (ref 30.0–36.0)
MCV: 89.3 fL (ref 80.0–100.0)
Monocytes Absolute: 0.3 10*3/uL (ref 0.1–1.0)
Monocytes Relative: 6 %
Neutro Abs: 3.2 10*3/uL (ref 1.7–7.7)
Neutrophils Relative %: 64 %
Platelets: 280 10*3/uL (ref 150–400)
RBC: 4.47 MIL/uL (ref 3.87–5.11)
RDW: 13.3 % (ref 11.5–15.5)
WBC: 4.9 10*3/uL (ref 4.0–10.5)
nRBC: 0 % (ref 0.0–0.2)

## 2019-03-07 LAB — COMPREHENSIVE METABOLIC PANEL
ALT: 41 U/L (ref 0–44)
AST: 31 U/L (ref 15–41)
Albumin: 3.3 g/dL — ABNORMAL LOW (ref 3.5–5.0)
Alkaline Phosphatase: 90 U/L (ref 38–126)
Anion gap: 9 (ref 5–15)
BUN: 16 mg/dL (ref 6–20)
CO2: 26 mmol/L (ref 22–32)
Calcium: 9.1 mg/dL (ref 8.9–10.3)
Chloride: 104 mmol/L (ref 98–111)
Creatinine, Ser: 0.54 mg/dL (ref 0.44–1.00)
GFR calc Af Amer: >60 mL/min (ref >60)
GFR calc non Af Amer: >60 mL/min (ref >60)
Glucose, Bld: 317 mg/dL — ABNORMAL HIGH (ref 70–99)
Potassium: 4.1 mmol/L (ref 3.5–5.1)
Sodium: 139 mmol/L (ref 135–145)
Total Bilirubin: 0.7 mg/dL (ref 0.3–1.2)
Total Protein: 7.5 g/dL (ref 6.5–8.1)

## 2019-03-07 LAB — BPAM FFP
Blood Product Expiration Date: 202101241218
ISSUE DATE / TIME: 202101231347
Unit Type and Rh: 5100

## 2019-03-07 LAB — PREPARE FRESH FROZEN PLASMA: Unit division: 0

## 2019-03-07 LAB — GLUCOSE, CAPILLARY
Glucose-Capillary: 283 mg/dL — ABNORMAL HIGH (ref 70–99)
Glucose-Capillary: 269 mg/dL — ABNORMAL HIGH (ref 70–99)
Glucose-Capillary: 124 mg/dL — ABNORMAL HIGH (ref 70–99)
Glucose-Capillary: 135 mg/dL — ABNORMAL HIGH (ref 70–99)

## 2019-03-07 LAB — MAGNESIUM: Magnesium: 1.9 mg/dL (ref 1.7–2.4)

## 2019-03-07 LAB — FERRITIN: Ferritin: 1017 ng/mL — ABNORMAL HIGH (ref 11–307)

## 2019-03-07 LAB — C-REACTIVE PROTEIN: CRP: 10.3 mg/dL — ABNORMAL HIGH (ref <1.0)

## 2019-03-07 LAB — D-DIMER, QUANTITATIVE: D-Dimer, Quant: 0.78 ug/mL-FEU — ABNORMAL HIGH (ref 0.00–0.50)

## 2019-03-07 MED ORDER — INSULIN GLARGINE 100 UNIT/ML ~~LOC~~ SOLN
20.0000 [IU] | Freq: Every day | SUBCUTANEOUS | Status: DC
  Administered 2019-03-08 – 2019-03-09 (×2): 20 [IU] via SUBCUTANEOUS
  Filled 2019-03-07 (×2): qty 0.2

## 2019-03-07 MED ORDER — INSULIN GLARGINE 100 UNIT/ML ~~LOC~~ SOLN
10.0000 [IU] | Freq: Once | SUBCUTANEOUS | Status: AC
  Administered 2019-03-07: 10 [IU] via SUBCUTANEOUS
  Filled 2019-03-07: qty 0.1

## 2019-03-07 MED ORDER — SODIUM CHLORIDE 0.9 % IV SOLN
100.0000 mg | Freq: Every day | INTRAVENOUS | Status: AC
  Administered 2019-03-08 – 2019-03-09 (×2): 100 mg via INTRAVENOUS
  Filled 2019-03-07 (×3): qty 20

## 2019-03-07 NOTE — Plan of Care (Signed)
  Problem: Education: Goal: Knowledge of risk factors and measures for prevention of condition will improve Outcome: Progressing   Problem: Coping: Goal: Psychosocial and spiritual needs will be supported Outcome: Progressing   Problem: Respiratory: Goal: Will maintain a patent airway Outcome: Progressing Goal: Complications related to the disease process, condition or treatment will be avoided or minimized Outcome: Progressing   

## 2019-03-07 NOTE — Progress Notes (Signed)
PROGRESS NOTE  Ruth [name] [id number] DOB: 11-21-1960 DOA: 03/05/2019  PCP: Flossie Buffy, NP  Brief History/Interval Summary: Ruth [name] is a 59 y.o. female with medical history significant of Covid positive on 27 February 2019, anemia, arthritis, diabetes mellitus type 2, GERD, HLD, HTN, obesity.  She presented with shortness of breath.  Noted to be hypoxic.  Was hospitalized for further management.  Reason for Visit: Pneumonia due to COVID-19.  Acute respiratory failure with hypoxia.  Consultants: None  Procedures: None  Antibiotics: Anti-infectives (From admission, onward)   Start     Dose/Rate Route Frequency Ordered Stop   03/06/19 2200  remdesivir 100 mg in sodium chloride 0.9 % 100 mL IVPB     100 mg 200 mL/hr over 30 Minutes Intravenous Daily 03/05/19 2252 03/10/19 2159   03/05/19 2300  remdesivir 200 mg in sodium chloride 0.9% 250 mL IVPB     200 mg 580 mL/hr over 30 Minutes Intravenous Once 03/05/19 2252 03/06/19 0142      Subjective/Interval History: Patient states that her shortness of breath is improving.  She still has a dry cough.  No chest pain.  Denies any nausea vomiting.   Assessment/Plan:  Acute Hypoxic Resp. Failure/Pneumonia due to COVID-19   Recent Labs  Lab 03/05/19 1815 03/05/19 1850 03/06/19 0414  DDIMER  --  0.92* 0.98*  FERRITIN  --   --  853*  CRP 14.6*  --  16.3*  ALT 51*  --  45*  PROCALCITON  --   --  <0.10    Objective findings: Fever: Remains afebrile Oxygen requirements: Noted to be on room air this morning saturating in the early 90s.    COVID 19 Therapeutics: Antibacterials: None Remdesivir: Day 3 Steroids: Dexamethasone 6 mg daily Diuretics: None Actemra: Not given Convalescent Plasma: Transfused on 1/23 Vitamin C and Zinc: Continue PUD Prophylaxis: Pepcid DVT Prophylaxis:  Lovenox 75 mg daily  Patient seems to be improving from a respiratory standpoint.  She is down to room air this morning.  Continue  remdesivir and steroids.  Inflammatory markers still pending from this morning.  Procalcitonin was normal.  She was given convalescent plasma yesterday.  Continue to mobilize.  Prone positioning as much as possible.  She is morbidly obese and so remains at high risk for decompensation although she appears to be improving.  Diabetes mellitus type II, uncontrolled with hyperglycemia Elevated CBGs most likely due to steroids.  HbA1c 11.2.  Continue with SSI.  Patient is on Metformin and glipizide at home.  She was started on Lantus yesterday.  Will likely need to uptitrate the dose.    Essential hypertension Blood pressure reasonably well controlled.  Continue amlodipine and atenolol.    Hyperlipidemia Continue with Lipitor.  History of degenerative disc disease Stable.  Continue to monitor.  PT and OT evaluation.  Morbid obesity Estimated body mass index is 57.33 kg/m as calculated from the following:   Height as of this encounter: 5\' 4"  (1.626 m).   Weight as of 02/16/19: 151.5 kg.   DVT Prophylaxis: Lovenox weight-based Code Status: Full code Family Communication: We will call her daughter later today. Disposition Plan: Management as outlined above.  Mobilize.  Hopefully will be [place] to go home when she has completed treatment with remdesivir.     Medications:  Scheduled: . amLODipine  5 mg Oral Daily  . vitamin C  500 mg Oral Daily  . atenolol  50 mg Oral Daily  . atorvastatin  40  mg Oral q1800  . Chlorhexidine Gluconate Cloth  6 each Topical Daily  . dexamethasone (DECADRON) injection  6 mg Intravenous Q24H  . enoxaparin (LOVENOX) injection  75 mg Subcutaneous QHS  . famotidine  20 mg Oral BID  . feeding supplement (PRO-STAT SUGAR FREE 64)  30 mL Oral TID  . insulin aspart  0-20 Units Subcutaneous TID WC  . insulin aspart  0-5 Units Subcutaneous QHS  . insulin aspart  4 Units Subcutaneous TID WC  . insulin glargine  10 Units Subcutaneous Daily  . multivitamin with minerals   1 tablet Oral Daily  . sodium chloride flush  10-40 mL Intracatheter Q12H  . sodium chloride flush  3 mL Intravenous Q12H  . zinc sulfate  220 mg Oral Daily   Continuous: . remdesivir 100 mg in NS 100 mL 100 mg (03/06/19 2331)   [id number], guaiFENesin-dextromethorphan, HYDROcodone-acetaminophen, ondansetron **OR** ondansetron (ZOFRAN) IV, sodium chloride flush, traMADol   Objective:  Vital Signs  Vitals:   03/06/19 1715 03/06/19 1730 03/06/19 2003 03/07/19 0413  BP: 134/79 (!) 72/55 (!) 140/94 (!) 136/92  Pulse: 79 75 82 70  Resp: 14 19 (!) 23 16  Temp:   98.7 F (37.1 C) 97.9 F (36.6 C)  TempSrc:   Temporal Oral  SpO2: 95% 93% 90% 92%  Height:        Intake/Output Summary (Last 24 hours) at 03/07/2019 1043 Last data filed at 03/07/2019 0400 Gross per 24 hour  Intake 990 ml  Output 0 ml  Net 990 ml   There were no vitals filed for this visit.  General appearance: Awake alert.  In no distress.  Morbidly obese Resp: Less tachypneic.  Few crackles bilateral bases.  No wheezing or rhonchi.   Cardio: S1-S2 is normal regular.  No S3-S4.  No rubs murmurs or bruit GI: Abdomen is soft.  Nontender nondistended.  Bowel sounds are present normal.  No masses organomegaly Extremities: No edema.  Full range of motion of lower extremities. Neurologic: Alert and oriented x3.  No focal neurological deficits.    Lab Results:  Data Reviewed: I have personally reviewed following labs and imaging studies  CBC: Recent Labs  Lab 03/05/19 1702 03/06/19 0414 03/07/19 0909  WBC 5.7 6.1 4.9  NEUTROABS 4.2 4.6 PENDING  HGB 13.6 12.9 12.7  HCT 42.3 40.3 39.9  MCV 91.0 90.8 89.3  PLT 225 [id number]    Basic Metabolic Panel: Recent Labs  Lab 03/05/19 1815 03/06/19 0414  NA 134* 137  K 4.7 4.3  CL 100 102  CO2 24 25  GLUCOSE 227* 270*  BUN 10 14  CREATININE 0.84 0.55  CALCIUM 8.8* 9.0  MG  --  2.0  PHOS  --  3.6    GFR: CrCl cannot be calculated (Unknown ideal  weight.).  Liver Function Tests: Recent Labs  Lab 03/05/19 1815 03/06/19 0414  AST 49* 38  ALT 51* 45*  ALKPHOS 94 90  BILITOT 1.1 1.1  PROT 6.9 7.5  ALBUMIN 3.1* 3.4*      HbA1C: Recent Labs    03/06/19 0414  HGBA1C 11.2*    CBG: Recent Labs  Lab 03/06/19 0115 03/06/19 1108 03/06/19 1556 03/06/19 2055 03/07/19 0740  GLUCAP 160* 297* 228* 251* 283*    Anemia Panel: Recent Labs    03/06/19 0414  FERRITIN 853*    Recent Results (from the past 240 hour(s))  Blood Culture (routine x 2)     Status: None (Preliminary result)  Collection Time: 03/05/19  5:55 PM   Specimen: BLOOD  Result Value Ref Range Status   Specimen Description BLOOD RIGHT ANTECUBITAL  Final   Special Requests   Final    BOTTLES DRAWN AEROBIC AND ANAEROBIC Blood Culture adequate volume   Culture   Final    NO GROWTH < 12 HOURS Performed at [facility], [address], [place], [place] [address]    Report Status PENDING  Incomplete  Respiratory Panel by RT PCR (Flu A&B, Covid) - Nasopharyngeal Swab     Status: Abnormal   Collection Time: 03/05/19  8:32 PM   Specimen: Nasopharyngeal Swab  Result Value Ref Range Status   SARS Coronavirus 2 by RT PCR POSITIVE (A) NEGATIVE Final    Comment: RESULT CALLED TO, READ BACK BY AND VERIFIED WITH: FG RN 03/05/19 2137 JDW (NOTE) SARS-CoV-2 target nucleic acids are DETECTED. SARS-CoV-2 RNA is generally detectable in upper respiratory specimens  during the acute phase of infection. Positive results are indicative of the presence of the identified virus, but do not rule out bacterial infection or co-infection with other pathogens not detected by the test. Clinical correlation with patient history and other diagnostic information is necessary to determine patient infection status. The expected result is Negative. Fact Sheet for Patients:  PinkCheek.be Fact Sheet for Healthcare  Providers: GravelBags.it This test is not yet approved or cleared by the [place] FDA and  has been authorized for detection and/or diagnosis of SARS-CoV-2 by FDA under an Emergency Use Authorization (EUA).  This EUA will remain in effect (meaning this test can be used) for the durati on of  the COVID-19 declaration under Section 564(b)(1) of the Act, 21 U.S.C. section 360bbb-3(b)(1), unless the authorization is terminated or revoked sooner.    Influenza A by PCR NEGATIVE NEGATIVE Final   Influenza B by PCR NEGATIVE NEGATIVE Final    Comment: (NOTE) The Xpert Xpress SARS-CoV-2/FLU/RSV assay is intended as an aid in  the diagnosis of influenza from Nasopharyngeal swab specimens and  should not be used as a sole basis for treatment. Nasal washings and  aspirates are unacceptable for Xpert Xpress SARS-CoV-2/FLU/RSV  testing. Fact Sheet for Patients: PinkCheek.be Fact Sheet for Healthcare Providers: GravelBags.it This test is not yet approved or cleared by the [place] FDA and  has been authorized for detection and/or diagnosis of SARS-CoV-2 by  FDA under an Emergency Use Authorization (EUA). This EUA will remain  in effect (meaning this test can be used) for the duration of the  Covid-19 declaration under Section 564(b)(1) of the Act, 21  U.S.C. section 360bbb-3(b)(1), unless the authorization is  terminated or revoked. Performed at [facility], [place] [address], [place], [place] [address]       Radiology Studies: DG Chest 2 View  Result Date: 03/05/2019 CLINICAL DATA:  COVID-19 positive patient with shortness of breath and cough. EXAM: CHEST - 2 VIEW COMPARISON:  PA and lateral chest 10/27/2017. FINDINGS: Hazy multifocal airspace disease consistent with pneumonia is seen. No pneumothorax or pleural fluid. Heart size is upper normal. No acute or focal bony abnormality. IMPRESSION:  Extensive hazy bilateral airspace disease consistent with pneumonia. Electronically Signed   By: Inge Rise M.D.   On: 03/05/2019 12:46   [place] EKG SITE RITE  Result Date: 03/06/2019 If Site Rite image not attached, placement could not be confirmed due to current cardiac rhythm.      LOS: 2 days   [facility]  Pager on [email or web address]  03/07/2019, 10:43 AM

## 2019-03-08 LAB — GLUCOSE, CAPILLARY
Glucose-Capillary: 384 mg/dL — ABNORMAL HIGH (ref 70–99)
Glucose-Capillary: 331 mg/dL — ABNORMAL HIGH (ref 70–99)
Glucose-Capillary: 250 mg/dL — ABNORMAL HIGH (ref 70–99)
Glucose-Capillary: 274 mg/dL — ABNORMAL HIGH (ref 70–99)
Glucose-Capillary: 330 mg/dL — ABNORMAL HIGH (ref 70–99)

## 2019-03-08 LAB — CBC WITH DIFFERENTIAL/PLATELET
Abs Immature Granulocytes: 0.08 10*3/uL — ABNORMAL HIGH (ref 0.00–0.07)
Basophils Absolute: 0 10*3/uL (ref 0.0–0.1)
Basophils Relative: 1 %
Eosinophils Absolute: 0 10*3/uL (ref 0.0–0.5)
Eosinophils Relative: 0 %
HCT: 40.7 % (ref 36.0–46.0)
Hemoglobin: 12.9 g/dL (ref 12.0–15.0)
Immature Granulocytes: 2 %
Lymphocytes Relative: 25 %
Lymphs Abs: 1.2 10*3/uL (ref 0.7–4.0)
MCH: 28.7 pg (ref 26.0–34.0)
MCHC: 31.7 g/dL (ref 30.0–36.0)
MCV: 90.6 fL (ref 80.0–100.0)
Monocytes Absolute: 0.2 10*3/uL (ref 0.1–1.0)
Monocytes Relative: 4 %
Neutro Abs: 3.4 10*3/uL (ref 1.7–7.7)
Neutrophils Relative %: 68 %
Platelets: 320 10*3/uL (ref 150–400)
RBC: 4.49 MIL/uL (ref 3.87–5.11)
RDW: 13.2 % (ref 11.5–15.5)
WBC: 4.9 10*3/uL (ref 4.0–10.5)
nRBC: 0 % (ref 0.0–0.2)

## 2019-03-08 LAB — COMPREHENSIVE METABOLIC PANEL
ALT: 38 U/L (ref 0–44)
AST: 27 U/L (ref 15–41)
Albumin: 3.5 g/dL (ref 3.5–5.0)
Alkaline Phosphatase: 84 U/L (ref 38–126)
Anion gap: 9 (ref 5–15)
BUN: 17 mg/dL (ref 6–20)
CO2: 25 mmol/L (ref 22–32)
Calcium: 9.3 mg/dL (ref 8.9–10.3)
Chloride: 104 mmol/L (ref 98–111)
Creatinine, Ser: 0.56 mg/dL (ref 0.44–1.00)
GFR calc Af Amer: >60 mL/min (ref >60)
GFR calc non Af Amer: >60 mL/min (ref >60)
Glucose, Bld: 328 mg/dL — ABNORMAL HIGH (ref 70–99)
Potassium: 4.4 mmol/L (ref 3.5–5.1)
Sodium: 138 mmol/L (ref 135–145)
Total Bilirubin: 0.8 mg/dL (ref 0.3–1.2)
Total Protein: 7.8 g/dL (ref 6.5–8.1)

## 2019-03-08 LAB — MAGNESIUM: Magnesium: 1.8 mg/dL (ref 1.7–2.4)

## 2019-03-08 LAB — C-REACTIVE PROTEIN: CRP: 5.2 mg/dL — ABNORMAL HIGH (ref <1.0)

## 2019-03-08 LAB — FERRITIN: Ferritin: 837 ng/mL — ABNORMAL HIGH (ref 11–307)

## 2019-03-08 LAB — D-DIMER, QUANTITATIVE: D-Dimer, Quant: 0.8 ug/mL-FEU — ABNORMAL HIGH (ref 0.00–0.50)

## 2019-03-08 MED ORDER — INSULIN STARTER KIT- PEN NEEDLES (ENGLISH)
1.0000 | Freq: Once | Status: AC
  Administered 2019-03-08: 1
  Filled 2019-03-08: qty 1

## 2019-03-08 NOTE — Progress Notes (Signed)
PROGRESS NOTE  Ruth [name] [id number] DOB: November 27, 1960 DOA: 03/05/2019  PCP: Flossie Buffy, NP  Brief History/Interval Summary: Ruth [name] is a 59 y.o. female with medical history significant of Covid positive on 27 February 2019, anemia, arthritis, diabetes mellitus type 2, GERD, HLD, HTN, obesity.  She presented with shortness of breath.  Noted to be hypoxic.  Was hospitalized for further management.  Reason for Visit: Pneumonia due to COVID-19.  Acute respiratory failure with hypoxia.  Consultants: None  Procedures: None  Antibiotics: Anti-infectives (From admission, onward)   Start     Dose/Rate Route Frequency Ordered Stop   03/08/19 1800  remdesivir 100 mg in sodium chloride 0.9 % 100 mL IVPB     100 mg 200 mL/hr over 30 Minutes Intravenous Daily 03/07/19 1735 03/11/19 0959   03/06/19 2200  remdesivir 100 mg in sodium chloride 0.9 % 100 mL IVPB  Status:  Discontinued     100 mg 200 mL/hr over 30 Minutes Intravenous Daily 03/05/19 2252 03/07/19 1735   03/05/19 2300  remdesivir 200 mg in sodium chloride 0.9% 250 mL IVPB     200 mg 580 mL/hr over 30 Minutes Intravenous Once 03/05/19 2252 03/06/19 0142      Subjective/Interval History: Patient states that she is feeling better.  Shortness of breath is improving.  Continues to have a dry cough.  Has not really ambulated in the hallway yet.     Assessment/Plan:  Acute Hypoxic Resp. Failure/Pneumonia due to COVID-19   Recent Labs  Lab 03/05/19 1815 03/05/19 1850 03/06/19 0414 03/07/19 0909 03/08/19 0440  DDIMER  --  0.92* 0.98* 0.78* 0.80*  FERRITIN  --   --  853* 1,017* 837*  CRP 14.6*  --  16.3* 10.3* 5.2*  ALT 51*  --  45* 41 38  PROCALCITON  --   --  <0.10  --   --     Objective findings: Fever: Remains afebrile Oxygen requirements: Room air this morning.  Saturating in the early 90s.    COVID 19 Therapeutics: Antibacterials: None Remdesivir: Day 4 Steroids: Dexamethasone 6 mg daily  Diuretics: None Actemra: Not given Convalescent Plasma: Transfused on 1/23 Vitamin C and Zinc: Continue PUD Prophylaxis: Pepcid DVT Prophylaxis:  Lovenox 75 mg daily  From a respiratory standpoint patient seems to be improving.  She is being weaned off of oxygen.  She needs to be ambulated.  Her inflammatory markers have improved.  D-dimer is 0.8.  Continue to mobilize.  Incentive spirometry.  Prone positioning as much as possible.  She will complete course of remdesivir tomorrow.  Hopefully she can be discharged at that time.  PT and OT evaluation.  Diabetes mellitus type II, uncontrolled with hyperglycemia Disease due to steroids.  HbA1c 11.2.  Continue with SSI and Lantus.  May need to further titrate Lantus.  We will see how her CBGs are today.  She will likely need to be discharged home with Lantus.  She was on Metformin and glipizide at home.     Essential hypertension Occasional high readings noted.  Continue amlodipine and atenolol.  Continue to monitor  Hyperlipidemia Continue with Lipitor.  History of degenerative disc disease Stable.  Continue to monitor.  PT and OT evaluation.  Morbid obesity Estimated body mass index is 57.33 kg/m as calculated from the following:   Height as of this encounter: 5\' 4"  (1.626 m).   Weight as of this encounter: 151.5 kg.   DVT Prophylaxis: Lovenox weight-based Code Status: Full  code Family Communication: Daughter being updated daily Disposition Plan: Anticipate discharge home tomorrow.  Await PT and OT evaluation.     Medications:  Scheduled: . amLODipine  5 mg Oral Daily  . vitamin C  500 mg Oral Daily  . atenolol  50 mg Oral Daily  . atorvastatin  40 mg Oral q1800  . Chlorhexidine Gluconate Cloth  6 each Topical Daily  . dexamethasone (DECADRON) injection  6 mg Intravenous Q24H  . enoxaparin (LOVENOX) injection  75 mg Subcutaneous QHS  . famotidine  20 mg Oral BID  . feeding supplement (PRO-STAT SUGAR FREE 64)  30 mL Oral TID   . insulin aspart  0-20 Units Subcutaneous TID WC  . insulin aspart  0-5 Units Subcutaneous QHS  . insulin aspart  4 Units Subcutaneous TID WC  . insulin glargine  20 Units Subcutaneous Daily  . multivitamin with minerals  1 tablet Oral Daily  . sodium chloride flush  10-40 mL Intracatheter Q12H  . sodium chloride flush  3 mL Intravenous Q12H  . zinc sulfate  220 mg Oral Daily   Continuous: . remdesivir 100 mg in NS 100 mL     [id number], guaiFENesin-dextromethorphan, HYDROcodone-acetaminophen, ondansetron **OR** ondansetron (ZOFRAN) IV, sodium chloride flush, traMADol   Objective:  Vital Signs  Vitals:   03/07/19 2106 03/08/19 0435 03/08/19 0630 03/08/19 0853  BP: (!) 144/97 (!) 162/87  137/86  Pulse: 73 67 62 79  Resp: 18 16  15   Temp: 98.2 F (36.8 C) 98.7 F (37.1 C)  (!) 97.5 F (36.4 C)  TempSrc: Oral Oral  Axillary  SpO2: 93% 96% 93% 95%  Weight:      Height:        Intake/Output Summary (Last 24 hours) at 03/08/2019 1049 Last data filed at 03/08/2019 0900 Gross per 24 hour  Intake 240 ml  Output 650 ml  Net -410 ml   Filed Weights   03/07/19 1921  Weight: (!) 151.5 kg    General appearance: Awake alert.  In no distress.  Morbidly obese Resp: Normal effort at rest.  Coarse breath sounds with crackles bilateral bases.  No wheezing or rhonchi.   Cardio: S1-S2 is normal regular.  No S3-S4.  No rubs murmurs or bruit GI: Abdomen is soft.  Nontender nondistended.  Bowel sounds are present normal.  No masses organomegaly Extremities: No edema.  Full range of motion of lower extremities. Neurologic: Alert and oriented x3.  No focal neurological deficits.    Lab Results:  Data Reviewed: I have personally reviewed following labs and imaging studies  CBC: Recent Labs  Lab 03/05/19 1702 03/06/19 0414 03/07/19 0909 03/08/19 0440  WBC 5.7 6.1 4.9 4.9  NEUTROABS 4.2 4.6 3.2 3.4  HGB 13.6 12.9 12.7 12.9  HCT 42.3 40.3 39.9 40.7  MCV 91.0 90.8 89.3 90.6   PLT 225 216 280 [id number]    Basic Metabolic Panel: Recent Labs  Lab 03/05/19 1815 03/06/19 0414 03/07/19 0909 03/08/19 0440  NA 134* 137 139 138  K 4.7 4.3 4.1 4.4  CL 100 102 104 104  CO2 24 25 26 25   GLUCOSE 227* 270* 317* 328*  BUN 10 14 16 17   CREATININE 0.84 0.55 0.54 0.56  CALCIUM 8.8* 9.0 9.1 9.3  MG  --  2.0 1.9 1.8  PHOS  --  3.6  --   --     GFR: Estimated Creatinine Clearance: 113 mL/min (by C-G formula based on SCr of 0.56 mg/dL).  Liver Function Tests:  Recent Labs  Lab 03/05/19 1815 03/06/19 0414 03/07/19 0909 03/08/19 0440  AST 49* 38 31 27  ALT 51* 45* 41 38  ALKPHOS 94 90 90 84  BILITOT 1.1 1.1 0.7 0.8  PROT 6.9 7.5 7.5 7.8  ALBUMIN 3.1* 3.4* 3.3* 3.5      HbA1C: Recent Labs    03/06/19 0414  HGBA1C 11.2*    CBG: Recent Labs  Lab 03/07/19 0740 03/07/19 1253 03/07/19 1729 03/07/19 2143 03/08/19 0850  GLUCAP 283* 269* 124* 135* 331*    Anemia Panel: Recent Labs    03/07/19 0909 03/08/19 0440  FERRITIN 1,017* 837*    Recent Results (from the past 240 hour(s))  Blood Culture (routine x 2)     Status: None (Preliminary result)   Collection Time: 03/05/19  5:55 PM   Specimen: BLOOD  Result Value Ref Range Status   Specimen Description BLOOD RIGHT ANTECUBITAL  Final   Special Requests   Final    BOTTLES DRAWN AEROBIC AND ANAEROBIC Blood Culture adequate volume   Culture   Final    NO GROWTH 3 DAYS Performed at [facility], [place] [address] [place], [place] [address]    Report Status PENDING  Incomplete  Respiratory Panel by RT PCR (Flu A&B, Covid) - Nasopharyngeal Swab     Status: Abnormal   Collection Time: 03/05/19  8:32 PM   Specimen: Nasopharyngeal Swab  Result Value Ref Range Status   SARS Coronavirus 2 by RT PCR POSITIVE (A) NEGATIVE Final    Comment: RESULT CALLED TO, READ BACK BY AND VERIFIED WITH: FG RN 03/05/19 2137 JDW (NOTE) SARS-CoV-2 target nucleic acids are DETECTED. SARS-CoV-2 RNA is generally  detectable in upper respiratory specimens  during the acute phase of infection. Positive results are indicative of the presence of the identified virus, but do not rule out bacterial infection or co-infection with other pathogens not detected by the test. Clinical correlation with patient history and other diagnostic information is necessary to determine patient infection status. The expected result is Negative. Fact Sheet for Patients:  PinkCheek.be Fact Sheet for Healthcare Providers: GravelBags.it This test is not yet approved or cleared by the [place] FDA and  has been authorized for detection and/or diagnosis of SARS-CoV-2 by FDA under an Emergency Use Authorization (EUA).  This EUA will remain in effect (meaning this test can be used) for the durati on of  the COVID-19 declaration under Section 564(b)(1) of the Act, 21 U.S.C. section 360bbb-3(b)(1), unless the authorization is terminated or revoked sooner.    Influenza A by PCR NEGATIVE NEGATIVE Final   Influenza B by PCR NEGATIVE NEGATIVE Final    Comment: (NOTE) The Xpert Xpress SARS-CoV-2/FLU/RSV assay is intended as an aid in  the diagnosis of influenza from Nasopharyngeal swab specimens and  should not be used as a sole basis for treatment. Nasal washings and  aspirates are unacceptable for Xpert Xpress SARS-CoV-2/FLU/RSV  testing. Fact Sheet for Patients: PinkCheek.be Fact Sheet for Healthcare Providers: GravelBags.it This test is not yet approved or cleared by the [place] FDA and  has been authorized for detection and/or diagnosis of SARS-CoV-2 by  FDA under an Emergency Use Authorization (EUA). This EUA will remain  in effect (meaning this test can be used) for the duration of the  Covid-19 declaration under Section 564(b)(1) of the Act, 21  U.S.C. section 360bbb-3(b)(1), unless the  authorization is  terminated or revoked. Performed at [facility], [place],  [place] [address]   Blood Culture (routine x 2)     Status: None (Preliminary result)   Collection Time: 03/06/19  4:23 AM   Specimen: BLOOD  Result Value Ref Range Status   Specimen Description   Final    BLOOD LEFT [facility] Performed at [facility], [address], [place], [place] [address]    Special Requests   Final    BOTTLES DRAWN AEROBIC AND ANAEROBIC Blood Culture adequate volume Performed at [facility], [address], [place], [place] [address]    Culture   Final    NO GROWTH 2 DAYS Performed at [facility], [place] [address], [place], [place] [address]    Report Status PENDING  Incomplete      Radiology Studies: [place] EKG SITE RITE  Result Date: 03/06/2019 If Site Rite image not attached, placement could not be confirmed due to current cardiac rhythm.      LOS: 3 days   Kambria Grima [employer] on [email or web address]  03/08/2019, 10:49 AM

## 2019-03-08 NOTE — Progress Notes (Addendum)
Inpatient Diabetes Program Recommendations  AACE/ADA: New Consensus Statement on Inpatient Glycemic Control   Target Ranges:  Prepandial:   less than 140 mg/dL      Peak postprandial:   less than 180 mg/dL (1-2 hours)      Critically ill patients:  140 - 180 mg/dL   Results for Ruth, [name] (MRN [id number]) as of 03/08/2019 09:55  Ref. Range 03/07/2019 07:40 03/07/2019 12:53 03/07/2019 17:29 03/07/2019 21:43 03/08/2019 08:50  Glucose-Capillary Latest Ref Range: 70 - 99 mg/dL 283 (H) 269 (H) 124 (H) 135 (H) 331 (H)   Review of Glycemic Control  Diabetes history: DM2 Outpatient Diabetes medications: Glipizide XL 5 mg daily, Metformin 850 mg BID Current orders for Inpatient glycemic control: Lantus 20 units daily, Novolog 0-20 units TID with meals, Novolog 0-5 units QHS, Novolog 4 units TID with meals; Decadron 6 mg Q24H  Inpatient Diabetes Program Recommendations:    Insulin-If steroids are continued as ordered, please consider changing basal insulin from Lantus to Levemir 15 units BID (based on 151.5 kg x 0.2 units).  NOTE: In reviewing chart, noted patient seen PCP on 02/11/19 and A1C was 11.6% on 02/10/19. Per PCP note, patient was started on Lantus 10 units QHS and was to increase Metformin to BID (Glipizide not prescribed at that time but current home medication list has Glipizide XL 5 mg daily and Lantus not listed).  Addendum 03/08/19@15 :55-Spoke with patient over the phone regarding DM control and outpatient DM medication regimen. Patient states that she seen PCP at the end of December and her A1C had went from 8 to 11.6% so her PCP had her increase Metformin from once a day to BID and had told her to restart Glipizide daily. Patient states that she was on Victoza and had stopped the Glipizide when she was on the Victoza. However the Victoza was stopped due to cause some issues with her pancreas.  Inquired about the Lantus and patient states that she was prescribed Lantus and she got Lantus  SoloStar filled which she has it at home but she never started Lantus yet. She talked with her provider and asked if she could try the Metformin BID and Glipizide and make dietary changes as well to see if she could get DM under better control before starting on insulin. Patient also notes that she is very hesitant about starting insulin as her mother was on insulin and she ended up with health complications and went blind. Discussed A1C of 11.2% on 03/06/19 and explained that current A1C indicates an average glucose of 275 mg/dl over the past 2-3 months. Discussed glucose and A1C goals. Explained how hyperglycemia leads to complications and stressed importance of improving DM control. Patient reports that since she started taking the Metformin 850 mg BID and Glipizide XL 5 mg daily her glucose was usually less than 200 mg/dl. Discussed impact of sickness (COVID) and steroids on glycemic control. Also discussed how DM2 is a progressive dx and she may get to the point where her pancreas can not make enough insulin and she may require insulin.  Explained that she is currently ordered steroids which are contributing to hyperglycemia. Explained that she may need insulin at home especially if she is discharged on insulin. Patient stated she really did not want to use insulin at home unless she absolutely had to. She is very hesitant and states she is not comfortable with giving herself insulin. Discussed insulin administration and asked if RNs could work with her on insulin injections  while inpatient so she will feel more comfortable with insulin administration in case she needed insulin going home. Patient is agreeable to work with nursing staff on self administering insulin. Discussed how to use an insulin pen since she has an insulin pen at home and informed patient that an insulin starter kit would be ordered so she can learn more about insulin administration. Discussed hypoglycemia along with treatment. Encouraged  patient to take DM medications as prescribed and asked that she reach out to her PCP if she has consistent glucose over 200 mg/dl or any issues with hypoglycemia so her PCP can let her know what adjustments to make with DM medications. Patient states she is agreeable to take insulin at home but she states that she really wants to try to take oral DM medications as an outpatient for DM control.   Patient verbalized understanding of information discussed and she states that she has no questions at this time. Will ask RNs to work with patient on insulin administration to allow patient to feel more comfortable with self injecting.  Thanks, Barnie Alderman, RN, MSN, CDE Diabetes Coordinator Inpatient Diabetes Program [phone] (Team Pager from 8am to 5pm)

## 2019-03-09 LAB — COMPREHENSIVE METABOLIC PANEL
ALT: 34 U/L (ref 0–44)
AST: 24 U/L (ref 15–41)
Albumin: 3.1 g/dL — ABNORMAL LOW (ref 3.5–5.0)
Alkaline Phosphatase: 78 U/L (ref 38–126)
Anion gap: 10 (ref 5–15)
BUN: 15 mg/dL (ref 6–20)
CO2: 25 mmol/L (ref 22–32)
Calcium: 8.9 mg/dL (ref 8.9–10.3)
Chloride: 105 mmol/L (ref 98–111)
Creatinine, Ser: 0.52 mg/dL (ref 0.44–1.00)
GFR calc Af Amer: >60 mL/min (ref >60)
GFR calc non Af Amer: >60 mL/min (ref >60)
Glucose, Bld: 260 mg/dL — ABNORMAL HIGH (ref 70–99)
Potassium: 3.9 mmol/L (ref 3.5–5.1)
Sodium: 140 mmol/L (ref 135–145)
Total Bilirubin: 0.4 mg/dL (ref 0.3–1.2)
Total Protein: 6.8 g/dL (ref 6.5–8.1)

## 2019-03-09 LAB — CBC WITH DIFFERENTIAL/PLATELET
Abs Immature Granulocytes: 0.07 10*3/uL (ref 0.00–0.07)
Basophils Absolute: 0 10*3/uL (ref 0.0–0.1)
Basophils Relative: 1 %
Eosinophils Absolute: 0.1 10*3/uL (ref 0.0–0.5)
Eosinophils Relative: 1 %
HCT: 39.3 % (ref 36.0–46.0)
Hemoglobin: 12.6 g/dL (ref 12.0–15.0)
Immature Granulocytes: 1 %
Lymphocytes Relative: 32 %
Lymphs Abs: 1.8 10*3/uL (ref 0.7–4.0)
MCH: 29 pg (ref 26.0–34.0)
MCHC: 32.1 g/dL (ref 30.0–36.0)
MCV: 90.3 fL (ref 80.0–100.0)
Monocytes Absolute: 0.4 10*3/uL (ref 0.1–1.0)
Monocytes Relative: 7 %
Neutro Abs: 3.4 10*3/uL (ref 1.7–7.7)
Neutrophils Relative %: 58 %
Platelets: 343 10*3/uL (ref 150–400)
RBC: 4.35 MIL/uL (ref 3.87–5.11)
RDW: 13.2 % (ref 11.5–15.5)
WBC: 5.7 10*3/uL (ref 4.0–10.5)
nRBC: 0 % (ref 0.0–0.2)

## 2019-03-09 LAB — GLUCOSE, CAPILLARY: Glucose-Capillary: 276 mg/dL — ABNORMAL HIGH (ref 70–99)

## 2019-03-09 MED ORDER — DEXAMETHASONE 2 MG PO TABS: ORAL_TABLET | ORAL | 0 refills | Status: DC

## 2019-03-09 MED ORDER — INSULIN GLARGINE 100 UNIT/ML SOLOSTAR PEN: 25.0000 [IU] | PEN_INJECTOR | Freq: Every day | SUBCUTANEOUS | 2 refills | Status: DC

## 2019-03-09 MED ORDER — ZINC SULFATE 220 (50 ZN) MG PO CAPS: 220.0000 mg | ORAL_CAPSULE | Freq: Every day | ORAL | 0 refills | Status: DC

## 2019-03-09 MED ORDER — FAMOTIDINE 20 MG PO TABS: 20.0000 mg | ORAL_TABLET | Freq: Every day | ORAL | 0 refills | Status: DC

## 2019-03-09 MED ORDER — INSULIN PEN NEEDLE 30G X 8 MM MISC: 1.0000 | 2 refills | Status: DC | PRN

## 2019-03-09 NOTE — Progress Notes (Signed)
SATURATION QUALIFICATIONS: (This note is used to comply with regulatory documentation for home oxygen)  Patient Saturations on Room Air at Rest = 96%  Patient Saturations on Room Air while Ambulating = 91-93%  Patient Saturations on NA Liters of oxygen while Ambulating = NA%  Please briefly explain why patient needs home oxygen: Pt does not require home O2.

## 2019-03-09 NOTE — Clinical Social Work Note (Signed)
CSW received request to call and verify her hospitalization to her job [phone]. CSW spoke with Deidre Ala and informed him that patient has been admitted for 4 days.   Deidre Ala stated that they have the patient out of work from 11/01/2018-11/02/2019. CSW confirmed with Deidre Ala that we are only verfying her most recent admission which is 03/05/19-03/09/19.   No other needs at this time. Case closed to this CSW.

## 2019-03-09 NOTE — Discharge Instructions (Signed)
COVID-19 COVID-19 is a respiratory infection that is caused by a virus called severe acute respiratory syndrome coronavirus 2 (SARS-CoV-2). The disease is also known as coronavirus disease or novel coronavirus. In some people, the virus may not cause any symptoms. In others, it may cause a serious infection. The infection can get worse quickly and can lead to complications, such as:  Pneumonia, or infection of the lungs.  Acute respiratory distress syndrome or ARDS. This is a condition in which fluid build-up in the lungs prevents the lungs from filling with air and passing oxygen into the blood.  Acute respiratory failure. This is a condition in which there is not enough oxygen passing from the lungs to the body or when carbon dioxide is not passing from the lungs out of the body.  Sepsis or septic shock. This is a serious bodily reaction to an infection.  Blood clotting problems.  Secondary infections due to bacteria or fungus.  Organ failure. This is when your body's organs stop working. The virus that causes COVID-19 is contagious. This means that it can spread from person to person through droplets from coughs and sneezes (respiratory secretions). What are the causes? This illness is caused by a virus. You may catch the virus by:  Breathing in droplets from an infected person. Droplets can be spread by a person breathing, speaking, singing, coughing, or sneezing.  Touching something, like a table or a doorknob, that was exposed to the virus (contaminated) and then touching your mouth, nose, or eyes. What increases the risk? Risk for infection You are more likely to be infected with this virus if you:  Are within 6 feet (2 meters) of a person with COVID-19.  Provide care for or live with a person who is infected with COVID-19.  Spend time in crowded indoor spaces or live in shared housing. Risk for serious illness You are more likely to become seriously ill from the virus if you:   Are 50 years of age or older. The higher your age, the more you are at risk for serious illness.  Live in a nursing home or long-term care facility.  Have cancer.  Have a long-term (chronic) disease such as: ? Chronic lung disease, including chronic obstructive pulmonary disease or asthma. ? A long-term disease that lowers your body's ability to fight infection (immunocompromised). ? Heart disease, including heart failure, a condition in which the arteries that lead to the heart become narrow or blocked (coronary artery disease), a disease which makes the heart muscle thick, weak, or stiff (cardiomyopathy). ? Diabetes. ? Chronic kidney disease. ? Sickle cell disease, a condition in which red blood cells have an abnormal "sickle" shape. ? Liver disease.  Are obese. What are the signs or symptoms? Symptoms of this condition can range from mild to severe. Symptoms may appear any time from 2 to 14 days after being exposed to the virus. They include:  A fever or chills.  A cough.  Difficulty breathing.  Headaches, body aches, or muscle aches.  Runny or stuffy (congested) nose.  A sore throat.  New loss of taste or smell. Some people may also have stomach problems, such as nausea, vomiting, or diarrhea. Other people may not have any symptoms of COVID-19. How is this diagnosed? This condition may be diagnosed based on:  Your signs and symptoms, especially if: ? You live in an area with a COVID-19 outbreak. ? You recently traveled to or from an area where the virus is common. ? You   provide care for or live with a person who was diagnosed with COVID-19. ? You were exposed to a person who was diagnosed with COVID-19.  A physical exam.  Lab tests, which may include: ? Taking a sample of fluid from the back of your nose and throat (nasopharyngeal fluid), your nose, or your throat using a swab. ? A sample of mucus from your lungs (sputum). ? Blood tests.  Imaging tests, which  may include, X-rays, CT scan, or ultrasound. How is this treated? At present, there is no medicine to treat COVID-19. Medicines that treat other diseases are being used on a trial basis to see if they are effective against COVID-19. Your health care provider will talk with you about ways to treat your symptoms. For most people, the infection is mild and can be managed at home with rest, fluids, and over-the-counter medicines. Treatment for a serious infection usually takes places in a hospital intensive care unit (ICU). It may include one or more of the following treatments. These treatments are given until your symptoms improve.  Receiving fluids and medicines through an IV.  Supplemental oxygen. Extra oxygen is given through a tube in the nose, a face mask, or a hood.  Positioning you to lie on your stomach (prone position). This makes it easier for oxygen to get into the lungs.  Continuous positive airway pressure (CPAP) or bi-level positive airway pressure (BPAP) machine. This treatment uses mild air pressure to keep the airways open. A tube that is connected to a motor delivers oxygen to the body.  Ventilator. This treatment moves air into and out of the lungs by using a tube that is placed in your windpipe.  Tracheostomy. This is a procedure to create a hole in the neck so that a breathing tube can be inserted.  Extracorporeal membrane oxygenation (ECMO). This procedure gives the lungs a chance to recover by taking over the functions of the heart and lungs. It supplies oxygen to the body and removes carbon dioxide. Follow these instructions at home: Lifestyle  If you are sick, stay home except to get medical care. Your health care provider will tell you how long to stay home. Call your health care provider before you go for medical care.  Rest at home as told by your health care provider.  Do not use any products that contain nicotine or tobacco, such as cigarettes, e-cigarettes, and  chewing tobacco. If you need help quitting, ask your health care provider.  Return to your normal activities as told by your health care provider. Ask your health care provider what activities are safe for you. General instructions  Take over-the-counter and prescription medicines only as told by your health care provider.  Drink enough fluid to keep your urine pale yellow.  Keep all follow-up visits as told by your health care provider. This is important. How is this prevented?  There is no vaccine to help prevent COVID-19 infection. However, there are steps you can take to protect yourself and others from this virus. To protect yourself:   Do not travel to areas where COVID-19 is a risk. The areas where COVID-19 is reported change often. To identify high-risk areas and travel restrictions, check the CDC travel website: wwwnc.cdc.gov/travel/notices  If you live in, or must travel to, an area where COVID-19 is a risk, take precautions to avoid infection. ? Stay away from people who are sick. ? Wash your hands often with soap and water for 20 seconds. If soap and water   are not available, use an alcohol-based hand sanitizer. ? Avoid touching your mouth, face, eyes, or nose. ? Avoid going out in public, follow guidance from your state and local health authorities. ? If you must go out in public, wear a cloth face covering or face mask. Make sure your mask covers your nose and mouth. ? Avoid crowded indoor spaces. Stay at least 6 feet (2 meters) away from others. ? Disinfect objects and surfaces that are frequently touched every day. This may include:  Counters and tables.  Doorknobs and light switches.  Sinks and faucets.  Electronics, such as phones, remote controls, keyboards, computers, and tablets. To protect others: If you have symptoms of COVID-19, take steps to prevent the virus from spreading to others.  If you think you have a COVID-19 infection, contact your health care  provider right away. Tell your health care team that you think you may have a COVID-19 infection.  Stay home. Leave your house only to seek medical care. Do not use public transport.  Do not travel while you are sick.  Wash your hands often with soap and water for 20 seconds. If soap and water are not available, use alcohol-based hand sanitizer.  Stay away from other members of your household. Let healthy household members care for children and pets, if possible. If you have to care for children or pets, wash your hands often and wear a mask. If possible, stay in your own room, separate from others. Use a different bathroom.  Make sure that all people in your household wash their hands well and often.  Cough or sneeze into a tissue or your sleeve or elbow. Do not cough or sneeze into your hand or into the air.  Wear a cloth face covering or face mask. Make sure your mask covers your nose and mouth. Where to find more information  Centers for Disease Control and Prevention: www.cdc.gov/coronavirus/2019-ncov/index.html  World Health Organization: www.who.int/health-topics/coronavirus Contact a health care provider if:  You live in or have traveled to an area where COVID-19 is a risk and you have symptoms of the infection.  You have had contact with someone who has COVID-19 and you have symptoms of the infection. Get help right away if:  You have trouble breathing.  You have pain or pressure in your chest.  You have confusion.  You have bluish lips and fingernails.  You have difficulty waking from sleep.  You have symptoms that get worse. These symptoms may represent a serious problem that is an emergency. Do not wait to see if the symptoms will go away. Get medical help right away. Call your local emergency services (911 in the [place] Do not drive yourself to the hospital. Let the emergency medical personnel know if you think you have COVID-19. Summary  COVID-19 is a  respiratory infection that is caused by a virus. It is also known as coronavirus disease or novel coronavirus. It can cause serious infections, such as pneumonia, acute respiratory distress syndrome, acute respiratory failure, or sepsis.  The virus that causes COVID-19 is contagious. This means that it can spread from person to person through droplets from breathing, speaking, singing, coughing, or sneezing.  You are more likely to develop a serious illness if you are 50 years of age or older, have a weak immune system, live in a nursing home, or have chronic disease.  There is no medicine to treat COVID-19. Your health care provider will talk with you about ways to treat your symptoms.    Take steps to protect yourself and others from infection. Wash your hands often and disinfect objects and surfaces that are frequently touched every day. Stay away from people who are sick and wear a mask if you are sick. This information is not intended to replace advice given to you by your health care provider. Make sure you discuss any questions you have with your health care provider. Document Revised: 11/27/2018 Document Reviewed: 03/05/2018 Elsevier Patient Education  2020 [employer]  

## 2019-03-09 NOTE — Progress Notes (Signed)
Inpatient Diabetes Program Recommendations  AACE/ADA: New Consensus Statement on Inpatient Glycemic Control   Target Ranges:  Prepandial:   less than 140 mg/dL      Peak postprandial:   less than 180 mg/dL (1-2 hours)      Critically ill patients:  140 - 180 mg/dL   Results for Ruth [name], Ruth [name] (MRN [id number]) as of 03/09/2019 09:55  Ref. Range 03/08/2019 08:50 03/08/2019 12:30 03/08/2019 15:55 03/08/2019 20:03 03/09/2019 08:19  Glucose-Capillary Latest Ref Range: 70 - 99 mg/dL 331 (H)  Novolog 19 units  Lantus 20 units @10 :37 250 (H)  Novolog 11 units 274 (H)  Novolog 15 units 330 (H)  Novolog 4 units 276 (H)  Novolog 15 units   Review of Glycemic Control  Diabetes history: DM2 Outpatient Diabetes medications: Glipizide XL 5 mg daily, Metformin 850 mg BID Current orders for Inpatient glycemic control: Lantus 20 units daily, Novolog 0-20 units TID with meals, Novolog 0-5 units QHS, Novolog 4 units TID with meals; Decadron 6 mg Q24H  Inpatient Diabetes Program Recommendations:    Insulin-If steroids are continued as ordered, please consider changing basal insulin from Lantus to Levemir 15 units BID (based on 151.5 kg x 0.2 units).  Peak of Levemir tends to work better when steroids are ordered and BID dosing will allow for adjustments to be made sooner.  Insulin-Meal Coverage: If steroids are continued as ordered, please consider increasing meal coverage to Novolog 8 units TID with meals if patient eats at least 50% of meals.  Thanks, Barnie Alderman, RN, MSN, CDE Diabetes Coordinator Inpatient Diabetes Program [phone] (Team Pager from 8am to 5pm)

## 2019-03-09 NOTE — Discharge Summary (Signed)
[facility]  Physician Discharge Summary   Patient ID: Ruth [name] MRN: [id number] DOB/AGE: [birth year] 59 y.o.  Admit date: 03/05/2019 Discharge date: 03/09/2019  PCP: Ruth Buffy, NP  DISCHARGE DIAGNOSES:  Pneumonia due to COVID-19 Acute respiratory failure with hypoxia, resolved Diabetes mellitus type 2, uncontrolled with hyperglycemia Essential hypertension Hyperlipidemia History of degenerative disc disease Morbid obesity  RECOMMENDATIONS FOR OUTPATIENT FOLLOW UP: 1. Patient prescribed Lantus.  May need dose adjustment depending on her [place]: None Equipment/Devices: None  CODE STATUS: Full code  DISCHARGE CONDITION: fair  Diet recommendation: Modified carbohydrate  INITIAL HISTORY: [name] a 59 y.o.femalewith medical history significant ofCovid positive on 27 February 2019, anemia, arthritis, diabetes mellitus type 2, GERD, HLD, HTN, obesity.  She presented with shortness of breath.  Noted to be hypoxic.  Was hospitalized for further management.   HOSPITAL COURSE:   Acute Hypoxic Resp. Failure/Pneumonia due to COVID-19 Patient was admitted to the hospital.  She was requiring oxygen initially.  She was slowly weaned down to room air.  She was placed on remdesivir and steroids.  She has completed 5 days of remdesivir today.  She will be discharged on steroid taper.  She was also given convalescent plasma.  She has ambulated without any difficulties.  She will be ambulated again today before discharge to make sure she does not have any O2 requirements.  Diabetes mellitus type II, uncontrolled with hyperglycemia HbA1c 11.2.    Patient has poor glycemic control at baseline.  CBGs are elevated also due to steroids.  She will be discharged on Lantus.  She takes Metformin and glipizide at home.  She was educated about poor control of her diabetes and complications of uncontrolled diabetes.  She is reluctant but agreeable to take insulin  at home.  She will be discharged on Lantus.  She will continue to check her CBGs and will coordinate care with her primary care provider.    Essential hypertension Continue home medications.  Elevated blood pressure likely due to steroids.  Hyperlipidemia Continue with Lipitor.  History of degenerative disc disease Stable.   Morbid obesity Estimated body mass index is 57.33 kg/m as calculated from the following:   Height as of this encounter: 5\' 4"  (1.626 m).   Weight as of this encounter: 151.5 kg.  Stable.  Okay for discharge home today.   PERTINENT LABS:  The results of significant diagnostics from this hospitalization (including imaging, microbiology, ancillary and laboratory) are listed below for reference.    Microbiology: Recent Results (from the past 240 hour(s))  Blood Culture (routine x 2)     Status: None (Preliminary result)   Collection Time: 03/05/19  5:55 PM   Specimen: BLOOD  Result Value Ref Range Status   Specimen Description BLOOD RIGHT ANTECUBITAL  Final   Special Requests   Final    BOTTLES DRAWN AEROBIC AND ANAEROBIC Blood Culture adequate volume   Culture   Final    NO GROWTH 4 DAYS Performed at [facility], [address] [place], [place] [address]    Report Status PENDING  Incomplete  Respiratory Panel by RT PCR (Flu A&B, Covid) - Nasopharyngeal Swab     Status: Abnormal   Collection Time: 03/05/19  8:32 PM   Specimen: Nasopharyngeal Swab  Result Value Ref Range Status   SARS Coronavirus 2 by RT PCR POSITIVE (A) NEGATIVE Final    Comment: RESULT CALLED TO, READ BACK BY AND VERIFIED WITH: FG RN 03/05/19  2137 JDW (NOTE) SARS-CoV-2 target nucleic acids are DETECTED. SARS-CoV-2 RNA is generally detectable in upper respiratory specimens  during the acute phase of infection. Positive results are indicative of the presence of the identified virus, but do not rule out bacterial infection or co-infection with other pathogens not detected  by the test. Clinical correlation with patient history and other diagnostic information is necessary to determine patient infection status. The expected result is Negative. Fact Sheet for Patients:  PinkCheek.be Fact Sheet for Healthcare Providers: GravelBags.it This test is not yet approved or cleared by the [place] FDA and  has been authorized for detection and/or diagnosis of SARS-CoV-2 by FDA under an Emergency Use Authorization (EUA).  This EUA will remain in effect (meaning this test can be used) for the durati on of  the COVID-19 declaration under Section 564(b)(1) of the Act, 21 U.S.C. section 360bbb-3(b)(1), unless the authorization is terminated or revoked sooner.    Influenza A by PCR NEGATIVE NEGATIVE Final   Influenza B by PCR NEGATIVE NEGATIVE Final    Comment: (NOTE) The Xpert Xpress SARS-CoV-2/FLU/RSV assay is intended as an aid in  the diagnosis of influenza from Nasopharyngeal swab specimens and  should not be used as a sole basis for treatment. Nasal washings and  aspirates are unacceptable for Xpert Xpress SARS-CoV-2/FLU/RSV  testing. Fact Sheet for Patients: PinkCheek.be Fact Sheet for Healthcare Providers: GravelBags.it This test is not yet approved or cleared by the [place] FDA and  has been authorized for detection and/or diagnosis of SARS-CoV-2 by  FDA under an Emergency Use Authorization (EUA). This EUA will remain  in effect (meaning this test can be used) for the duration of the  Covid-19 declaration under Section 564(b)(1) of the Act, 21  U.S.C. section 360bbb-3(b)(1), unless the authorization is  terminated or revoked. Performed at [facility], [place] [address] [place], [place] [address]   Blood Culture (routine x 2)     Status: None (Preliminary result)   Collection Time: 03/06/19  4:23 AM   Specimen: BLOOD  Result  Value Ref Range Status   Specimen Description   Final    BLOOD LEFT [facility] Performed at [facility], [address], [place], [place] [address]    Special Requests   Final    BOTTLES DRAWN AEROBIC AND ANAEROBIC Blood Culture adequate volume Performed at [facility], [address] [place], [place] [address]    Culture   Final    NO GROWTH 3 DAYS Performed at [facility], [place] [address], [place], [place] [address]    Report Status PENDING  Incomplete     Labs:    Basic Metabolic Panel: Recent Labs  Lab 03/05/19 1815 03/06/19 0414 03/07/19 0909 03/08/19 0440 03/09/19 0556  NA 134* 137 139 138 140  K 4.7 4.3 4.1 4.4 3.9  CL 100 102 104 104 105  CO2 24 25 26 25 25   GLUCOSE 227* 270* 317* 328* 260*  BUN 10 14 16 17 15   CREATININE 0.84 0.55 0.54 0.56 0.52  CALCIUM 8.8* 9.0 9.1 9.3 8.9  MG  --  2.0 1.9 1.8  --   PHOS  --  3.6  --   --   --    Liver Function Tests: Recent Labs  Lab 03/05/19 1815 03/06/19 0414 03/07/19 0909 03/08/19 0440 03/09/19 0556  AST 49* 38 31 27 24   ALT 51* 45* 41 38 34  ALKPHOS 94 90 90 84 78  BILITOT  1.1 1.1 0.7 0.8 0.4  PROT 6.9 7.5 7.5 7.8 6.8  ALBUMIN 3.1* 3.4* 3.3* 3.5 3.1*   CBC: Recent Labs  Lab 03/05/19 1702 03/06/19 0414 03/07/19 0909 03/08/19 0440 03/09/19 0556  WBC 5.7 6.1 4.9 4.9 5.7  NEUTROABS 4.2 4.6 3.2 3.4 3.4  HGB 13.6 12.9 12.7 12.9 12.6  HCT 42.3 40.3 39.9 40.7 39.3  MCV 91.0 90.8 89.3 90.6 90.3  PLT 225 216 280 320 343    CBG: Recent Labs  Lab 03/08/19 0850 03/08/19 1230 03/08/19 1555 03/08/19 2003 03/09/19 0819  GLUCAP 331* 250* 274* 330* 276*     IMAGING STUDIES DG Chest 2 View  Result Date: 03/05/2019 CLINICAL DATA:  COVID-19 positive patient with shortness of breath and cough. EXAM: CHEST - 2 VIEW COMPARISON:  PA and lateral chest 10/27/2017. FINDINGS: Hazy multifocal airspace disease consistent with pneumonia is seen. No pneumothorax or pleural fluid. Heart size is  upper normal. No acute or focal bony abnormality. IMPRESSION: Extensive hazy bilateral airspace disease consistent with pneumonia. Electronically Signed   By: Inge Rise M.D.   On: 03/05/2019 12:46   [place] EKG SITE RITE  Result Date: 03/06/2019 If Site Rite image not attached, placement could not be confirmed due to current cardiac rhythm.   DISCHARGE EXAMINATION: Vitals:   03/08/19 2003 03/09/19 0545 03/09/19 0817 03/09/19 1143  BP: 125/81 (!) 126/96 (!) 146/99 (!) 143/102  Pulse: 70 65 70 72  Resp: 16 20 20 20   Temp: 98 F (36.7 C) 97.8 F (36.6 C) (!) 97.5 F (36.4 C) 97.6 F (36.4 C)  TempSrc: Oral Oral Oral Oral  SpO2: 96% 96% 94% 97%  Weight:      Height:       General appearance: Awake alert.  In no distress Resp: Clear to auscultation bilaterally.  Normal effort Cardio: S1-S2 is normal regular.  No S3-S4.  No rubs murmurs or bruit GI: Abdomen is soft.  Nontender nondistended.  Bowel sounds are present normal.  No masses organomegaly    DISPOSITION: Home  Discharge Instructions    Call MD for:  difficulty breathing, headache or visual disturbances   Complete by: As directed    Call MD for:  extreme fatigue   Complete by: As directed    Call MD for:  persistant dizziness or light-headedness   Complete by: As directed    Call MD for:  persistant nausea and vomiting   Complete by: As directed    Call MD for:  severe uncontrolled pain   Complete by: As directed    Call MD for:  temperature >100.4   Complete by: As directed    Diet Carb Modified   Complete by: As directed    Discharge instructions   Complete by: As directed    Please be sure to follow-up with your PCP for further management of diabetes.  COVID 19 INSTRUCTIONS  - You are felt to be stable enough to no longer require inpatient monitoring, testing, and treatment, though you will need to follow the recommendations below: - Based on the CDC's non-test criteria for ending self-isolation: You may  not return to work/leave the home until at least 21 days since symptom onset AND 3 days without a fever (without taking tylenol, ibuprofen, etc.) AND have improvement in respiratory symptoms. - Do not take NSAID medications (including, but not limited to, ibuprofen, advil, motrin, naproxen, aleve, goody's powder, etc.) - Follow up with your doctor in the next week via telehealth or seek medical  attention right away if your symptoms get WORSE.  - Consider donating plasma after you have recovered (either 14 days after a negative test or 28 days after symptoms have completely resolved) because your antibodies to this virus may be helpful to give to others with life-threatening infections. Please go to the website www.oneblood.org if you would like to consider volunteering for plasma donation.    Directions for you at home:  Wear a facemask You should wear a facemask that covers your nose and mouth when you are in the same room with other people and when you visit a healthcare provider. People who live with or visit you should also wear a facemask while they are in the same room with you.  Separate yourself from other people in your home As much as possible, you should stay in a different room from other people in your home. Also, you should use a separate bathroom, if available.  Avoid sharing household items You should not share dishes, drinking glasses, cups, eating utensils, towels, bedding, or other items with other people in your home. After using these items, you should wash them thoroughly with soap and water.  Cover your coughs and sneezes Cover your mouth and nose with a tissue when you cough or sneeze, or you can cough or sneeze into your sleeve. Throw used tissues in a lined trash can, and immediately wash your hands with soap and water for at least 20 seconds or use an alcohol-based hand rub.  Wash your [facility] your hands often and thoroughly with soap and water for at least 20  seconds. You can use an alcohol-based hand sanitizer if soap and water are not available and if your hands are not visibly dirty. Avoid touching your eyes, nose, and mouth with unwashed hands.  Directions for those who live with, or provide care at home for you:  Limit the number of people who have contact with the patient If possible, have only one caregiver for the patient. Other household members should stay in another home or place of residence. If this is not possible, they should stay in another room, or be separated from the patient as much as possible. Use a separate bathroom, if available. Restrict visitors who do not have an essential need to be in the home.  Ensure good ventilation Make sure that shared spaces in the home have good air flow, such as from an air conditioner or an opened window, weather permitting.  Wash your hands often Wash your hands often and thoroughly with soap and water for at least 20 seconds. You can use an alcohol based hand sanitizer if soap and water are not available and if your hands are not visibly dirty. Avoid touching your eyes, nose, and mouth with unwashed hands. Use disposable paper towels to dry your hands. If not available, use dedicated cloth towels and replace them when they become wet.  Wear a facemask and gloves Wear a disposable facemask at all times in the room and gloves when you touch or have contact with the patient's blood, body fluids, and/or secretions or excretions, such as sweat, saliva, sputum, nasal mucus, vomit, urine, or feces.  Ensure the mask fits over your nose and mouth tightly, and do not touch it during use. Throw out disposable facemasks and gloves after using them. Do not reuse. Wash your hands immediately after removing your facemask and gloves. If your personal clothing becomes contaminated, carefully remove clothing and launder. Wash your hands after handling contaminated  clothing. Place all used disposable  facemasks, gloves, and other waste in a lined container before disposing them with other household waste. Remove gloves and wash your hands immediately after handling these items.  Do not share dishes, glasses, or other household items with the patient Avoid sharing household items. You should not share dishes, drinking glasses, cups, eating utensils, towels, bedding, or other items with a patient who is confirmed to have, or being evaluated for, COVID-19 infection. After the person uses these items, you should wash them thoroughly with soap and water.  Wash laundry thoroughly Immediately remove and wash clothes or bedding that have blood, body fluids, and/or secretions or excretions, such as sweat, saliva, sputum, nasal mucus, vomit, urine, or feces, on them. Wear gloves when handling laundry from the patient. Read and follow directions on labels of laundry or clothing items and detergent. In general, wash and dry with the warmest temperatures recommended on the label.  Clean all areas the individual has used often Clean all touchable surfaces, such as counters, tabletops, doorknobs, bathroom fixtures, toilets, phones, keyboards, tablets, and bedside tables, every day. Also, clean any surfaces that may have blood, body fluids, and/or secretions or excretions on them. Wear gloves when cleaning surfaces the patient has come in contact with. Use a diluted bleach solution (e.g., dilute bleach with 1 part bleach and 10 parts water) or a household disinfectant with a label that says EPA-registered for coronaviruses. To make a bleach solution at home, add 1 tablespoon of bleach to 1 quart (4 cups) of water. For a larger supply, add  cup of bleach to 1 gallon (16 cups) of water. Read labels of cleaning products and follow recommendations provided on product labels. Labels contain instructions for safe and effective use of the cleaning product including precautions you should take when applying the product,  such as wearing gloves or eye protection and making sure you have good ventilation during use of the product. Remove gloves and wash hands immediately after cleaning.  Monitor yourself for signs and symptoms of illness Caregivers and household members are considered close contacts, should monitor their health, and will be asked to limit movement outside of the home to the extent possible. Follow the monitoring steps for close contacts listed on the symptom monitoring form.   If you have additional questions, contact your local health department or call the epidemiologist on call at [phone] (available 24/7). This guidance is subject to change. For the most up-to-date guidance from [facility], please refer to their website: YouBlogs.pl   You were cared for by a hospitalist during your hospital stay. If you have any questions about your discharge medications or the care you received while you were in the hospital after you are discharged, you can call the unit and asked to speak with the hospitalist on call if the hospitalist that took care of you is not available. Once you are discharged, your primary care physician will handle any further medical issues. Please note that NO REFILLS for any discharge medications will be authorized once you are discharged, as it is imperative that you return to your primary care physician (or establish a relationship with a primary care physician if you do not have one) for your aftercare needs so that they can reassess your need for medications and monitor your lab values. If you do not have a primary care physician, you can call [phone] for a physician referral.   Increase activity slowly   Complete by: As directed  Allergies as of 03/09/2019      Reactions   Lisinopril Swelling   Throat swelling   Tape Other (See Comments)   Certain Band aids cause skin irritation      Medication List      STOP taking these medications   buPROPion 150 MG 12 hr tablet Commonly known as: Wellbutrin SR     TAKE these medications   amLODipine 5 MG tablet Commonly known as: NORVASC Take 1 tablet (5 mg total) by mouth at bedtime. What changed: when to take this   atenolol 50 MG tablet Commonly known as: TENORMIN Take 1 tablet (50 mg total) by mouth daily.   atorvastatin 40 MG tablet Commonly known as: LIPITOR Take 1 tablet (40 mg total) by mouth daily at 6 PM.   BLACK ELDERBERRY PO Take 2 mLs by mouth daily.   dexamethasone 2 MG tablet Commonly known as: DECADRON Take 2 tablets once daily for 3 days, then 1 tablet once daily for 3 days, then STOP.   famotidine 20 MG tablet Commonly known as: PEPCID Take 1 tablet (20 mg total) by mouth daily for 10 days.   glipiZIDE 5 MG 24 hr tablet Commonly known as: GLUCOTROL XL Take 1 tablet (5 mg total) by mouth daily with breakfast. With heaviest meals What changed: additional instructions   glucose blood test strip Check blood glucose before breakfast and at bedtime. Accu Chek test strips   ibuprofen 800 MG tablet Commonly known as: ADVIL Take 800 mg by mouth every 8 (eight) hours as needed (pain).   Insulin Glargine 100 UNIT/ML Solostar Pen Commonly known as: LANTUS Inject 25 Units into the skin daily.   Insulin Pen Needle 30G X 8 MM Misc Commonly known as: NOVOFINE Inject 10 each into the skin as needed.   metFORMIN 850 MG tablet Commonly known as: GLUCOPHAGE Take 1 tablet (850 mg total) by mouth 2 (two) times daily with a meal.   multivitamin with minerals tablet Take 1 tablet by mouth daily.   traMADol 50 MG tablet Commonly known as: ULTRAM Take 50 mg by mouth every 6 (six) hours as needed (pain).   VITAMIN C PO Take 1 tablet by mouth daily.   Vitamin D (Ergocalciferol) 1.25 MG (50000 UNIT) Caps capsule Commonly known as: DRISDOL Take 1 capsule (50,000 Units total) by mouth every 7 (seven) days. What changed:  when to take this   zinc sulfate 220 (50 Zn) MG capsule Take 1 capsule (220 mg total) by mouth daily for 10 days. Start taking on: March 10, 2019        Follow-up Information    Nche, Charlene Brooke, NP. Schedule an appointment as soon as possible for a visit in 1 week(s).   Specialty: Internal Medicine Why: Schedule televisit for and to discuss further management of diabetes. Contact information: [address] [address] [phone]           TOTAL DISCHARGE TIME: 35 minutes  [facility] Pager on [email or web address]  03/09/2019, 1:25 PM

## 2019-03-10 ENCOUNTER — Telehealth: Payer: Self-pay | Admitting: *Deleted

## 2019-03-10 ENCOUNTER — Encounter: Payer: Self-pay | Admitting: Nurse Practitioner

## 2019-03-10 ENCOUNTER — Ambulatory Visit (INDEPENDENT_AMBULATORY_CARE_PROVIDER_SITE_OTHER): Payer: Managed Care, Other (non HMO) | Admitting: Nurse Practitioner

## 2019-03-10 VITALS — HR 67 | Temp 96.9°F | Ht 64.0 in

## 2019-03-10 DIAGNOSIS — E1165 Type 2 diabetes mellitus with hyperglycemia: Secondary | ICD-10-CM | POA: Diagnosis not present

## 2019-03-10 DIAGNOSIS — I10 Essential (primary) hypertension: Secondary | ICD-10-CM

## 2019-03-10 DIAGNOSIS — U071 COVID-19: Secondary | ICD-10-CM | POA: Diagnosis not present

## 2019-03-10 LAB — CULTURE, BLOOD (ROUTINE X 2)
Culture: NO GROWTH
Special Requests: ADEQUATE

## 2019-03-10 LAB — GLUCOSE, CAPILLARY: Glucose-Capillary: 333 mg/dL — ABNORMAL HIGH (ref 70–99)

## 2019-03-10 MED ORDER — ATENOLOL 50 MG PO TABS: 50.0000 mg | ORAL_TABLET | Freq: Every day | ORAL | 1 refills | Status: DC

## 2019-03-10 MED ORDER — AMLODIPINE BESYLATE 5 MG PO TABS: 5.0000 mg | ORAL_TABLET | Freq: Every day | ORAL | 1 refills | Status: DC

## 2019-03-10 MED ORDER — GLIPIZIDE ER 2.5 MG PO TB24: 2.5000 mg | ORAL_TABLET | Freq: Every day | ORAL | 1 refills | Status: DC

## 2019-03-10 NOTE — Assessment & Plan Note (Signed)
Elevated glucose readings with oral prednisone. (200s-300s). Started lantus injection this morning, but not sure about medication dose.  Educated on how to use lantus pen and correct dose. Also educated on s/s of hyglycemia. She is to decrease glipizide dose to 2.5mg  with heaviest meal and maintain metformin dose. F/up in 2weeks.

## 2019-03-10 NOTE — Patient Instructions (Addendum)
Decrease glipizide to 2.5mg  daily. Maintain other medications as prescribed. Continue glucose check AM and PM Check BP and temperature once a day.  Maintain adequate oral hydration and DASH diet.

## 2019-03-10 NOTE — Telephone Encounter (Signed)
Transition Care Management Follow-up Telephone Call   Date discharged? 03/09/19   How have you been since you were released from the hospital? "Doing fine"   Do you understand why you were in the hospital? yes   Do you understand the discharge instructions? I need help with my meds.    Where were you discharged to? Home w/ husband   Items Reviewed:  Medications reviewed: yes, pt seems unsure of meds. Has appt today w/ PCP @11am   Allergies reviewed: yes  Dietary changes reviewed: yes  Referrals reviewed: yes   Functional Questionnaire:   Activities of Daily Living (ADLs):   She states they are independent in the following: ambulation, bathing and hygiene, feeding, continence, grooming, toileting and dressing States they require assistance with the following: na   Any transportation issues/concerns?: no   Any patient concerns? What to know more about her DM meds.   Confirmed importance and date/time of follow-up visits scheduled yes  Provider Appointment booked with PCP today 03/10/19 @11 .  Confirmed with patient if condition begins to worsen call PCP or go to the ER.  Patient was given the office number and encouraged to call back with question or concerns.  :yes

## 2019-03-10 NOTE — Progress Notes (Signed)
Virtual Visit via Video Note  I connected with@ on 03/10/19 at 11:00 AM EST by a video enabled telemedicine application and verified that I am speaking with the correct person using two identifiers.  Location: Patient:Home Provider: Office Participants: patient and provider  I discussed the limitations of evaluation and management by telemedicine and the availability of in person appointments. I also discussed with the patient that there may be a patient responsible charge related to this service. The patient expressed understanding and agreed to proceed.  [id number] f/up. Pneumonia due to COVID-19  History of Present Illness: Hospital stay:1/23-1/26/2021. Positive COVID test on 02/27/2019. Ruth [name] was admitted to [facility] due to hypoxia and dehydration. She was diagnosed with pneumonia. She was treated with convalescent plasma, remdesvir and steriods. She was [place] to wean of supplemental oxegen.Her hospital stay was complicated by Morbid Obesity and uncontrolled DM (Hgb A1c of 11). Her BP was initially elevated, but normalized prior to discharge.  Reviewed medication list, lab results and radiology report. Today she reports Resolved cough, SOB, hypoxia, diarrhea and nausea. Improved oral hydration and nutrition. Elevated glucose readings with oral prednisone. (200s-300s). Started lantus injection this morning, but not sure about medication dose.   Observations/Objective: Physical Exam  Constitutional: She is oriented to person, place, and time. No distress.  Pulmonary/Chest: Effort normal.  Musculoskeletal:     Cervical back: Normal range of motion and neck supple.  Neurological: She is alert and oriented to person, place, and time.  Psychiatric: She has a normal mood and affect. Her behavior is normal. Thought content normal.   Assessment and Plan: Ruth [name] was seen today for hospitalization follow-up.  Diagnoses and all orders for this visit:  COVID-19 virus infection  Type 2  diabetes mellitus with hyperglycemia, without long-term current use of insulin (HCC) -     glipiZIDE (GLUCOTROL XL) 2.5 MG 24 hr tablet; Take 1 tablet (2.5 mg total) by mouth daily with breakfast. With heaviest meals  HTN (hypertension), benign -     amLODipine (NORVASC) 5 MG tablet; Take 1 tablet (5 mg total) by mouth at bedtime. -     atenolol (TENORMIN) 50 MG tablet; Take 1 tablet (50 mg total) by mouth daily.   Follow Up Instructions: See avs   I discussed the assessment and treatment plan with the patient. The patient was provided an opportunity to ask questions and all were answered. The patient agreed with the plan and demonstrated an understanding of the instructions.   The patient was advised to call back or seek an in-person evaluation if the symptoms worsen or if the condition fails to improve as anticipated.  Daphane Shepherd, NP

## 2019-03-10 NOTE — Assessment & Plan Note (Signed)
Unable to provide BP today due to machine malfunction. Maintain current medications F/up in 2weeks

## 2019-03-11 LAB — CULTURE, BLOOD (ROUTINE X 2)
Culture: NO GROWTH
Special Requests: ADEQUATE

## 2019-03-12 ENCOUNTER — Encounter: Payer: Self-pay | Admitting: Nurse Practitioner

## 2019-03-12 ENCOUNTER — Telehealth: Payer: Self-pay | Admitting: Nurse Practitioner

## 2019-03-15 ENCOUNTER — Ambulatory Visit (INDEPENDENT_AMBULATORY_CARE_PROVIDER_SITE_OTHER): Payer: Managed Care, Other (non HMO) | Admitting: Bariatrics

## 2019-03-18 ENCOUNTER — Telehealth: Payer: Self-pay | Admitting: Nurse Practitioner

## 2019-03-18 ENCOUNTER — Encounter: Payer: Self-pay | Admitting: Nurse Practitioner

## 2019-03-18 ENCOUNTER — Telehealth (INDEPENDENT_AMBULATORY_CARE_PROVIDER_SITE_OTHER): Payer: Managed Care, Other (non HMO) | Admitting: Nurse Practitioner

## 2019-03-18 ENCOUNTER — Other Ambulatory Visit: Payer: Self-pay

## 2019-03-18 VITALS — Ht 64.0 in

## 2019-03-18 DIAGNOSIS — I10 Essential (primary) hypertension: Secondary | ICD-10-CM

## 2019-03-18 DIAGNOSIS — E1165 Type 2 diabetes mellitus with hyperglycemia: Secondary | ICD-10-CM

## 2019-03-18 MED ORDER — INSULIN GLARGINE 100 UNIT/ML SOLOSTAR PEN: 10.0000 [IU] | PEN_INJECTOR | Freq: Every day | SUBCUTANEOUS | 0 refills | Status: DC

## 2019-03-18 NOTE — Progress Notes (Signed)
Virtual Visit via Video Note  I connected with@ on 03/18/19 at  8:45 AM EST by a video enabled telemedicine application and verified that I am speaking with the correct person using two identifiers.  Location: Patient:Home Provider: Office Participants: patient and provider   I discussed the limitations of evaluation and management by telemedicine and the availability of in person appointments. I also discussed with the patient that there may be a patient responsible charge related to this service. The patient expressed understanding and agreed to proceed.  CC:DM and HTN f/up  History of Present Illness: Unable to provide any BP readings. Fasting glucose for last 3days: 180, 245, 252 Last lantus dose was administered on Monday 03/15/2019. Lantus was discontinued because she had completed oral prednisone therapy. She denies any dysuria, nausea, fever, cough, SOB, diarrhea, headache, or dizziness.  Observations/Objective: Physical Exam  Constitutional: She is oriented to person, place, and time.  Pulmonary/Chest: Effort normal.  Neurological: She is alert and oriented to person, place, and time.   Assessment and Plan: Ruth [name] was seen today for follow-up.  Diagnoses and all orders for this visit:  Type 2 diabetes mellitus with hyperglycemia, without long-term current use of insulin (HCC) -     Insulin Glargine (LANTUS) 100 UNIT/ML Solostar Pen; Inject 10 Units into the skin daily.  HTN (hypertension), benign    Follow Up Instructions: See avs   I discussed the assessment and treatment plan with the patient. The patient was provided an opportunity to ask questions and all were answered. The patient agreed with the plan and demonstrated an understanding of the instructions.   The patient was advised to call back or seek an in-person evaluation if the symptoms worsen or if the condition fails to improve as anticipated.   [name], NP

## 2019-03-18 NOTE — Telephone Encounter (Signed)
Patient is calling back and has questions about when she is suppose to return to work. CB is [phone]

## 2019-03-18 NOTE — Assessment & Plan Note (Signed)
Fasting glucose for last 3days: 180, 245, 252 Last lantus dose was administered on Monday 03/15/2019. Lantus was discontinued because she had completed oral prednisone therapy.  Resume lantus at 10units. Maintain glipizide and metformin dose. Fasting glucose goal is <200. Call office if you are experiencing any hypoglycemic episodes or glucose<100. F/up in 3weeks

## 2019-03-18 NOTE — Patient Instructions (Signed)
Resume lantus at 10units. Maintain glipizide and metformin dose. Fasting glucose goal is <200. Call office if you are experiencing any hypoglycemic episodes or glucose<100.   Hypoglycemia Hypoglycemia is when the sugar (glucose) level in your blood is too low. Signs of low blood sugar may include:  Feeling: ? Hungry. ? Worried or nervous (anxious). ? Sweaty and clammy. ? Confused. ? Dizzy. ? Sleepy. ? Sick to your stomach (nauseous).  Having: ? A fast heartbeat. ? A headache. ? A change in your vision. ? Tingling or no feeling (numbness) around your mouth, lips, or tongue. ? Jerky movements that you cannot control (seizure).  Having trouble with: ? Moving (coordination). ? Sleeping. ? Passing out (fainting). ? Getting upset easily (irritability). Low blood sugar can happen to people who have diabetes and people who do not have diabetes. Low blood sugar can happen quickly, and it can be an emergency. Treating low blood sugar Low blood sugar is often treated by eating or drinking something sugary right away, such as:  Fruit juice, 4-6 oz (120-150 mL).  Regular soda (not diet soda), 4-6 oz (120-150 mL).  Low-fat milk, 4 oz (120 mL).  Several pieces of hard candy.  Sugar or honey, 1 Tbsp (15 mL). Treating low blood sugar if you have diabetes If you can think clearly and swallow safely, follow the 15:15 rule:  Take 15 grams of a fast-acting carb (carbohydrate). Talk with your doctor about how much you should take.  Always keep a source of fast-acting carb with you, such as: ? Sugar tablets (glucose pills). Take 3-4 pills. ? 6-8 pieces of hard candy. ? 4-6 oz (120-150 mL) of fruit juice. ? 4-6 oz (120-150 mL) of regular (not diet) soda. ? 1 Tbsp (15 mL) honey or sugar.  Check your blood sugar 15 minutes after you take the carb.  If your blood sugar is still at or below 70 mg/dL (3.9 mmol/L), take 15 grams of a carb again.  If your blood sugar does not go above 70  mg/dL (3.9 mmol/L) after 3 tries, get help right away.  After your blood sugar goes back to normal, eat a meal or a snack within 1 hour.  Treating very low blood sugar If your blood sugar is at or below 54 mg/dL (3 mmol/L), you have very low blood sugar (severe hypoglycemia). This may also cause:  Passing out.  Jerky movements you cannot control (seizure).  Losing consciousness (coma). This is an emergency. Do not wait to see if the symptoms will go away. Get medical help right away. Call your local emergency services (911 in the [place] Do not drive yourself to the hospital. If you have very low blood sugar and you cannot eat or drink, you may need a glucagon shot (injection). A family member or friend should learn how to check your blood sugar and how to give you a glucagon shot. Ask your doctor if you need to have a glucagon shot kit at home. Follow these instructions at home: General instructions  Take over-the-counter and prescription medicines only as told by your doctor.  Stay aware of your blood sugar as told by your doctor.  Limit alcohol intake to no more than 1 drink a day for nonpregnant women and 2 drinks a day for men. One drink equals 12 oz of beer (355 mL), 5 oz of wine (148 mL), or 1 oz of hard liquor (44 mL).  Keep all follow-up visits as told by your doctor. This is important.  If you have diabetes:   Follow your diabetes care plan as told by your doctor. Make sure you: ? Know the signs of low blood sugar. ? Take your medicines as told. ? Follow your exercise and meal plan. ? Eat on time. Do not skip meals. ? Check your blood sugar as often as told by your doctor. Always check it before and after exercise. ? Follow your sick day plan when you cannot eat or drink normally. Make this plan ahead of time with your doctor.  Share your diabetes care plan with: ? Your work or school. ? People you live with.  Check your pee (urine) for ketones: ? When you are  sick. ? As told by your doctor.  Carry a card or wear jewelry that says you have diabetes. Contact a doctor if:  You have trouble keeping your blood sugar in your target range.  You have low blood sugar often. Get help right away if:  You still have symptoms after you eat or drink something sugary.  Your blood sugar is at or below 54 mg/dL (3 mmol/L).  You have jerky movements that you cannot control.  You pass out. These symptoms may be an emergency. Do not wait to see if the symptoms will go away. Get medical help right away. Call your local emergency services (911 in the [place] Do not drive yourself to the hospital. Summary  Hypoglycemia happens when the level of sugar (glucose) in your blood is too low.  Low blood sugar can happen to people who have diabetes and people who do not have diabetes. Low blood sugar can happen quickly, and it can be an emergency.  Make sure you know the signs of low blood sugar and know how to treat it.  Always keep a source of sugar (fast-acting carb) with you to treat low blood sugar. This information is not intended to replace advice given to you by your health care provider. Make sure you discuss any questions you have with your health care provider. Document Revised: 05/21/2018 Document Reviewed: 03/03/2015 Elsevier Patient Education  2020 [employer].

## 2019-03-18 NOTE — Telephone Encounter (Signed)
Pt is aware to turn to work after 03/30/2019.  Pt stated she might have paper work come from her work place for [place] to fill out. FYI

## 2019-03-22 NOTE — Telephone Encounter (Signed)
Patient is calling and wanted to speak to someone regarding previous left. CB [phone]

## 2019-03-23 NOTE — Telephone Encounter (Signed)
Spoke with [employer] again, they going to refax the paper work.   Pt is aware of this.

## 2019-03-24 ENCOUNTER — Encounter: Payer: Self-pay | Admitting: Nurse Practitioner

## 2019-03-25 ENCOUNTER — Telehealth: Payer: Self-pay

## 2019-03-25 ENCOUNTER — Telehealth: Payer: Self-pay | Admitting: Podiatry

## 2019-03-25 NOTE — Telephone Encounter (Signed)
Patient needs a note to continue out of work since employer will not allow her to work with restrictions.

## 2019-03-25 NOTE — Telephone Encounter (Signed)
Pt left message about paperwork for [employer] and asked for a call back.

## 2019-03-25 NOTE — Telephone Encounter (Signed)
Patient left a message. Her employer has rejected the restrictions we recommended so she has been unable to return to work. Patient would like a call back to discuss. Thanks

## 2019-03-26 ENCOUNTER — Telehealth: Payer: Self-pay | Admitting: Podiatry

## 2019-03-26 NOTE — Telephone Encounter (Signed)
I spoke with Ruth [name] this morning and she said that she was suppose to go back to work on the 8th, but she had Covid.  Shes just now finding out that her job, wont let her come back with the restrictions. She said that she does not have to stand a lot and would like to get back to work.

## 2019-03-29 ENCOUNTER — Telehealth: Payer: Self-pay

## 2019-03-29 ENCOUNTER — Encounter: Payer: Self-pay | Admitting: Podiatry

## 2019-03-29 NOTE — Telephone Encounter (Signed)
Pt called stating she is going back to work 04/05/19. Pt would like to know further recommendations from Dr. Milinda Pointer if PT is still needed, and of any work restrictions?

## 2019-03-29 NOTE — Telephone Encounter (Signed)
That's fine with me but she is the one that asked to be out of work because she could perform the duties required by her employment.

## 2019-03-29 NOTE — Telephone Encounter (Signed)
Called pt-L/M-advised to keep her appt on 2/18 and he will evaluate and make a determination at that time.

## 2019-04-01 ENCOUNTER — Ambulatory Visit: Payer: Managed Care, Other (non HMO) | Admitting: Podiatry

## 2019-04-02 ENCOUNTER — Telehealth: Payer: Self-pay | Admitting: Podiatry

## 2019-04-02 NOTE — Telephone Encounter (Signed)
Ruth [name] and I discussed this- Ruth [name] will write her a note out of work until her next follow up visit with Dr. Milinda Pointer on March 23rd. 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

## 2019-04-02 NOTE — Telephone Encounter (Signed)
Pt was suppose to be seen on 02/18/20201 and the office was closed. She needs a letter with restrictions for when she returns to work on Monday. please give patient a call.

## 2019-04-05 ENCOUNTER — Telehealth: Payer: Self-pay | Admitting: Nurse Practitioner

## 2019-04-05 ENCOUNTER — Other Ambulatory Visit: Payer: Self-pay

## 2019-04-05 ENCOUNTER — Encounter: Payer: Self-pay | Admitting: Podiatry

## 2019-04-05 ENCOUNTER — Ambulatory Visit: Payer: Managed Care, Other (non HMO) | Admitting: Nurse Practitioner

## 2019-04-05 NOTE — Telephone Encounter (Signed)
Was on hold for 30 mins--will try again tomorrow.

## 2019-04-05 NOTE — Telephone Encounter (Addendum)
Ruth [name] is calling and wanted to know if disability paperwork received. CB is [phone] reference number [id number]

## 2019-04-06 ENCOUNTER — Ambulatory Visit (INDEPENDENT_AMBULATORY_CARE_PROVIDER_SITE_OTHER): Payer: Managed Care, Other (non HMO) | Admitting: Nurse Practitioner

## 2019-04-06 ENCOUNTER — Encounter: Payer: Self-pay | Admitting: Nurse Practitioner

## 2019-04-06 VITALS — BP 132/82 | HR 85 | Temp 97.3°F | Ht 64.0 in | Wt 339.8 lb

## 2019-04-06 DIAGNOSIS — I10 Essential (primary) hypertension: Secondary | ICD-10-CM | POA: Diagnosis not present

## 2019-04-06 DIAGNOSIS — E1165 Type 2 diabetes mellitus with hyperglycemia: Secondary | ICD-10-CM | POA: Diagnosis not present

## 2019-04-06 DIAGNOSIS — Z6841 Body Mass Index (BMI) 40.0 and over, adult: Secondary | ICD-10-CM

## 2019-04-06 MED ORDER — OZEMPIC (0.25 OR 0.5 MG/DOSE) 2 MG/1.5ML ~~LOC~~ SOPN: 0.2500 mg | PEN_INJECTOR | SUBCUTANEOUS | 1 refills | Status: DC

## 2019-04-06 NOTE — Patient Instructions (Addendum)
Normal pancreatic enzymes and renal function Elevated glucose due to uncontrolled DM. Start medications as prescribed F/up in 85month in office  Check glucose before breakfast and before supper.  Maintain metformin and glipizide Continue lantus at 10units at hs, hold if AM glucose is <200 Hold glipizide if AM glucose <150  Start Ozempic injection once a week. F/up in 52month  Semaglutide injection solution What is this medicine? SEMAGLUTIDE (Sem a GLOO tide) is used to improve blood sugar control in adults with type 2 diabetes. This medicine may be used with other diabetes medicines. This drug may also reduce the risk of heart attack or stroke if you have type 2 diabetes and risk factors for heart disease. This medicine may be used for other purposes; ask your health care provider or pharmacist if you have questions. COMMON BRAND NAME(S): OZEMPIC What should I tell my health care provider before I take this medicine? They need to know if you have any of these conditions:  endocrine tumors (MEN 2) or if someone in your family had these tumors  eye disease, vision problems  history of pancreatitis  kidney disease  stomach problems  thyroid cancer or if someone in your family had thyroid cancer  an unusual or allergic reaction to semaglutide, other medicines, foods, dyes, or preservatives  pregnant or trying to get pregnant  breast-feeding How should I use this medicine? This medicine is for injection under the skin of your upper leg (thigh), stomach area, or upper arm. It is given once every week (every 7 days). You will be taught how to prepare and give this medicine. Use exactly as directed. Take your medicine at regular intervals. Do not take it more often than directed. If you use this medicine with insulin, you should inject this medicine and the insulin separately. Do not mix them together. Do not give the injections right next to each other. Change (rotate) injection  sites with each injection. It is important that you put your used needles and syringes in a special sharps container. Do not put them in a trash can. If you do not have a sharps container, call your pharmacist or healthcare provider to get one. A special MedGuide will be given to you by the pharmacist with each prescription and refill. Be sure to read this information carefully each time. This drug comes with INSTRUCTIONS FOR USE. Ask your pharmacist for directions on how to use this drug. Read the information carefully. Talk to your pharmacist or health care provider if you have questions. Talk to your pediatrician regarding the use of this medicine in children. Special care may be needed. Overdosage: If you think you have taken too much of this medicine contact a poison control center or emergency room at once. NOTE: This medicine is only for you. Do not share this medicine with others. What if I miss a dose? If you miss a dose, take it as soon as you can within 5 days after the missed dose. Then take your next dose at your regular weekly time. If it has been longer than 5 days after the missed dose, do not take the missed dose. Take the next dose at your regular time. Do not take double or extra doses. If you have questions about a missed dose, contact your health care provider for advice. What may interact with this medicine?  other medicines for diabetes Many medications may cause changes in blood sugar, these include:  alcohol containing beverages  antiviral medicines for HIV or  AIDS  aspirin and aspirin-like drugs  certain medicines for blood pressure, heart disease, irregular heart beat  chromium  diuretics  female hormones, such as estrogens or progestins, birth control pills  fenofibrate  gemfibrozil  isoniazid  lanreotide  female hormones or anabolic steroids  MAOIs like Carbex, Eldepryl, Marplan, Nardil, and Parnate  medicines for weight loss  medicines for  allergies, asthma, cold, or cough  medicines for depression, anxiety, or psychotic disturbances  niacin  nicotine  NSAIDs, medicines for pain and inflammation, like ibuprofen or naproxen  octreotide  pasireotide  pentamidine  phenytoin  probenecid  quinolone antibiotics such as ciprofloxacin, levofloxacin, ofloxacin  some herbal dietary supplements  steroid medicines such as prednisone or cortisone  sulfamethoxazole; trimethoprim  thyroid hormones Some medications can hide the warning symptoms of low blood sugar (hypoglycemia). You may need to monitor your blood sugar more closely if you are taking one of these medications. These include:  beta-blockers, often used for high blood pressure or heart problems (examples include atenolol, metoprolol, propranolol)  clonidine  guanethidine  reserpine This list may not describe all possible interactions. Give your health care provider a list of all the medicines, herbs, non-prescription drugs, or dietary supplements you use. Also tell them if you smoke, drink alcohol, or use illegal drugs. Some items may interact with your medicine. What should I watch for while using this medicine? Visit your doctor or health care professional for regular checks on your progress. Drink plenty of fluids while taking this medicine. Check with your doctor or health care professional if you get an attack of severe diarrhea, nausea, and vomiting. The loss of too much body fluid can make it dangerous for you to take this medicine. A test called the HbA1C (A1C) will be monitored. This is a simple blood test. It measures your blood sugar control over the last 2 to 3 months. You will receive this test every 3 to 6 months. Learn how to check your blood sugar. Learn the symptoms of low and high blood sugar and how to manage them. Always carry a quick-source of sugar with you in case you have symptoms of low blood sugar. Examples include hard sugar candy or  glucose tablets. Make sure others know that you can choke if you eat or drink when you develop serious symptoms of low blood sugar, such as seizures or unconsciousness. They must get medical help at once. Tell your doctor or health care professional if you have high blood sugar. You might need to change the dose of your medicine. If you are sick or exercising more than usual, you might need to change the dose of your medicine. Do not skip meals. Ask your doctor or health care professional if you should avoid alcohol. Many nonprescription cough and cold products contain sugar or alcohol. These can affect blood sugar. Pens should never be shared. Even if the needle is changed, sharing may result in passing of viruses like hepatitis or HIV. Wear a medical ID bracelet or chain, and carry a card that describes your disease and details of your medicine and dosage times. Do not become pregnant while taking this medicine. Women should inform their doctor if they wish to become pregnant or think they might be pregnant. There is a potential for serious side effects to an unborn child. Talk to your health care professional or pharmacist for more information. What side effects may I notice from receiving this medicine? Side effects that you should report to your doctor or  health care professional as soon as possible:  allergic reactions like skin rash, itching or hives, swelling of the face, lips, or tongue  breathing problems  changes in vision  diarrhea that continues or is severe  lump or swelling on the neck  severe nausea  signs and symptoms of infection like fever or chills; cough; sore throat; pain or trouble passing urine  signs and symptoms of low blood sugar such as feeling anxious, confusion, dizziness, increased hunger, unusually weak or tired, sweating, shakiness, cold, irritable, headache, blurred vision, fast heartbeat, loss of consciousness  signs and symptoms of kidney injury like  trouble passing urine or change in the amount of urine  trouble swallowing  unusual stomach upset or pain  vomiting Side effects that usually do not require medical attention (report to your doctor or health care professional if they continue or are bothersome):  constipation  diarrhea  nausea  pain, redness, or irritation at site where injected  stomach upset This list may not describe all possible side effects. Call your doctor for medical advice about side effects. You may report side effects to FDA at 1-800-FDA-1088. Where should I keep my medicine? Keep out of the reach of children. Store unopened pens in a refrigerator between 2 and 8 degrees C (36 and 46 degrees F). Do not freeze. Protect from light and heat. After you first use the pen, it can be stored for 56 days at room temperature between 15 and 30 degrees C (59 and 86 degrees F) or in a refrigerator. Throw away your used pen after 56 days or after the expiration date, whichever comes first. Do not store your pen with the needle attached. If the needle is left on, medicine may leak from the pen. NOTE: This sheet is a summary. It may not cover all possible information. If you have questions about this medicine, talk to your doctor, pharmacist, or health care provider.  2020 Elsevier/Gold Standard (2018-10-13 09:41:51)

## 2019-04-06 NOTE — Telephone Encounter (Signed)
Spoke with the pt, this is a long term disability and Ruth [name] is only doing short term FMLA for covid. Faxed paper work back to [employer].  Pt stated the other doctor should have this LTD paper work by now to fill out.

## 2019-04-06 NOTE — Progress Notes (Signed)
Subjective:  Patient ID: MYRTLENE SERVATIUS, female    DOB: [birth year]  Age: 59 y.o. MRN: [id number]  CC: Follow-up (pt states blood sugar is staying about 200 meds isn't working, pt reports 140/92 yesterday as a readying on BP )  HPI DM: Uncontrolled with hgbA1c of 11 Home glucose AM and PM:>250, no hypoglycemia. She admits to non compliance with diet. Has not scheduled f/up appt with weight loss clinic. She is interested in consultation with bariatric surgeon. Current use of metformin, glipizide and lantus. Unable to tolerate SGLT due to recurrent vaginitis. Elevated lipase and amylase with victoza. She is not pleased about continuous use of lantus or any other insulin. LDL not at goal, current use of atorvastatin. Lipid Panel     Component Value Date/Time   CHOL 210 (H) 08/21/2018 1401   CHOL 186 08/22/2017 0931   TRIG 95 08/21/2018 1401   HDL 52 08/21/2018 1401   HDL 62 08/22/2017 0931   CHOLHDL 4.0 08/21/2018 1401   VLDL 22.0 08/02/2016 1659   LDLCALC 138 (H) 08/21/2018 1401   LABVLDL 10 08/22/2017 0931   HTN: BP at goal with amlodipine and atenolol Angioedema with ACE-I BP Readings from Last 3 Encounters:  04/06/19 132/82  03/09/19 (!) 143/102  03/05/19 124/74   Reviewed past Medical, Social and Family history today.  Outpatient Medications Prior to Visit  Medication Sig Dispense Refill  . amLODipine (NORVASC) 5 MG tablet Take 1 tablet (5 mg total) by mouth at bedtime. 90 tablet 1  . Ascorbic Acid (VITAMIN C PO) Take 1 tablet by mouth daily.    Marland Kitchen atenolol (TENORMIN) 50 MG tablet Take 1 tablet (50 mg total) by mouth daily. 90 tablet 1  . atorvastatin (LIPITOR) 40 MG tablet Take 1 tablet (40 mg total) by mouth daily at 6 PM. 90 tablet 3  . BLACK ELDERBERRY PO Take 2 mLs by mouth daily.    Marland Kitchen glipiZIDE (GLUCOTROL XL) 2.5 MG 24 hr tablet Take 1 tablet (2.5 mg total) by mouth daily with breakfast. With heaviest meals 90 tablet 1  . glucose blood test strip Check blood  glucose before breakfast and at bedtime. Accu Chek test strips 100 each 12  . ibuprofen (ADVIL) 800 MG tablet Take 800 mg by mouth every 8 (eight) hours as needed (pain).    . Insulin Glargine (LANTUS) 100 UNIT/ML Solostar Pen Inject 10 Units into the skin daily. 15 mL 0  . Insulin Pen Needle (NOVOFINE) 30G X 8 MM MISC Inject 10 each into the skin as needed. 1 packet 2  . metFORMIN (GLUCOPHAGE) 850 MG tablet Take 1 tablet (850 mg total) by mouth 2 (two) times daily with a meal. 60 tablet 5  . Multiple Vitamins-Minerals (MULTIVITAMIN WITH MINERALS) tablet Take 1 tablet by mouth daily.    . traMADol (ULTRAM) 50 MG tablet Take 50 mg by mouth every 6 (six) hours as needed (pain).     . Vitamin D, Ergocalciferol, (DRISDOL) 1.25 MG (50000 UT) CAPS capsule Take 1 capsule (50,000 Units total) by mouth every 7 (seven) days. (Patient taking differently: Take 50,000 Units by mouth every Monday. ) 4 capsule 0  . glipiZIDE (GLUCOTROL XL) 2.5 MG 24 hr tablet Take 1 tablet (2.5 mg total) by mouth daily with breakfast. With heaviest meals 90 tablet 1  . glipiZIDE (GLUCOTROL XL) 2.5 MG 24 hr tablet Take 2 tablets (5 mg total) by mouth daily with breakfast. With heaviest meals 90 tablet 1  . Insulin Glargine (LANTUS)  100 UNIT/ML Solostar Pen Inject 10 Units into the skin daily. 15 mL 0  . Insulin Glargine (LANTUS) 100 UNIT/ML Solostar Pen Inject 15 Units into the skin daily. 15 mL 0  . famotidine (PEPCID) 20 MG tablet Take 1 tablet (20 mg total) by mouth daily for 10 days. 10 tablet 0   No facility-administered medications prior to visit.    ROS See HPI  Objective:  BP 132/82   Pulse 85   Temp (!) 97.3 F (36.3 C) (Tympanic)   Ht 5\' 4"  (1.626 m)   Wt (!) 339 lb 12.8 oz (154.1 kg)   SpO2 98%   BMI 58.33 kg/m   BP Readings from Last 3 Encounters:  04/06/19 132/82  03/09/19 (!) 143/102  03/05/19 124/74    Wt Readings from Last 3 Encounters:  04/06/19 (!) 339 lb 12.8 oz (154.1 kg)  03/07/19 (!) 334  lb (151.5 kg)  02/16/19 (!) 334 lb (151.5 kg)   Physical Exam Vitals reviewed.  Constitutional:      Appearance: She is obese.  Cardiovascular:     Rate and Rhythm: Normal rate and regular rhythm.     Pulses: Normal pulses.     Heart sounds: Normal heart sounds.  Neurological:     Mental Status: She is alert and oriented to person, place, and time.    Lab Results  Component Value Date   WBC 5.7 03/09/2019   HGB 12.6 03/09/2019   HCT 39.3 03/09/2019   PLT 343 03/09/2019   GLUCOSE 308 (H) 04/06/2019   CHOL 210 (H) 08/21/2018   TRIG 95 08/21/2018   HDL 52 08/21/2018   LDLCALC 138 (H) 08/21/2018   ALT 34 03/09/2019   AST 24 03/09/2019   NA 141 04/06/2019   K 4.6 04/06/2019   CL 103 04/06/2019   CREATININE 0.58 04/06/2019   BUN 9 04/06/2019   CO2 24 04/06/2019   TSH 1.760 09/07/2018   HGBA1C 11.2 (H) 03/06/2019   MICROALBUR 0.4 08/21/2018    Assessment & Plan:  This visit occurred during the SARS-CoV-2 public health emergency.  Safety protocols were in place, including screening questions prior to the visit, additional usage of staff PPE, and extensive cleaning of exam room while observing appropriate contact time as indicated for disinfecting solutions.   Ernestyne was seen today for follow-up.  Diagnoses and all orders for this visit:  Type 2 diabetes mellitus with hyperglycemia, without long-term current use of insulin (HCC) -     Cancel: Lipase -     Cancel: Amylase -     Semaglutide,0.25 or 0.5MG /DOS, (OZEMPIC, 0.25 OR 0.5 MG/DOSE,) 2 MG/1.5ML SOPN; Inject 0.25 mg into the skin once a week. -     Cancel: Basic metabolic panel -     Amylase -     Basic metabolic panel -     Lipase -     Amb Referral to Bariatric Surgery  HTN (hypertension), benign -     Cancel: Basic metabolic panel -     Amylase -     Basic metabolic panel -     Lipase -     Amb Referral to Bariatric Surgery  Class 3 severe obesity due to excess calories with serious comorbidity and body  mass index (BMI) of 50.0 to 59.9 in adult [facility]) -     Amb Referral to Bariatric Surgery  BMI 50.0-59.9, adult (HCC) -     Amb Referral to Bariatric Surgery   I have discontinued Barron Schmid.  W[place]liff's glipiZIDE and Insulin Glargine. I have also changed her glipiZIDE and Insulin Glargine. Additionally, I am having her start on Ozempic (0.25 or 0.5 MG/DOSE). Lastly, I am having her maintain her multivitamin with minerals, traMADol, Vitamin D (Ergocalciferol), metFORMIN, atorvastatin, glucose blood, ibuprofen, Ascorbic Acid (VITAMIN C PO), BLACK ELDERBERRY PO, famotidine, Insulin Pen Needle, amLODipine, and atenolol.  Meds ordered this encounter  Medications  . Semaglutide,0.25 or 0.5MG /DOS, (OZEMPIC, 0.25 OR 0.5 MG/DOSE,) 2 MG/1.5ML SOPN    Sig: Inject 0.25 mg into the skin once a week.    Dispense:  1.5 mL    Refill:  1    Order Specific Question:   Supervising Provider    Answer:   Ronnald Nian [id number]    Problem List Items Addressed This Visit      Cardiovascular and Mediastinum   HTN (hypertension), benign   Relevant Orders   Amylase (Completed)   Basic metabolic panel (Completed)   Lipase (Completed)   Amb Referral to Bariatric Surgery     Endocrine   DM (diabetes mellitus) ([place]) - Primary    Home glucose AM and PM:>250, no hypoglycemia. She admits to non compliance with diet. Has not scheduled f/up appt with weight loss clinic. Normal amylase and lipase, normal renal function Maintain metformin and glipizide Check glucose AM and PM Continue lantus at 10units at hs, hold if AM glucose is <200 Hold glipizide if AM glucose <150 Start Ozempic injection once a week. F/up in 67month       Relevant Medications   Semaglutide,0.25 or 0.5MG /DOS, (OZEMPIC, 0.25 OR 0.5 MG/DOSE,) 2 MG/1.5ML SOPN   glipiZIDE (GLUCOTROL XL) 2.5 MG 24 hr tablet   Insulin Glargine (LANTUS) 100 UNIT/ML Solostar Pen   Other Relevant Orders   Amylase (Completed)   Basic metabolic panel (Completed)     Lipase (Completed)   Amb Referral to Bariatric Surgery     Other   Class 3 severe obesity with serious comorbidity and body mass index (BMI) of 50.0 to 59.9 in adult [facility])   Relevant Medications   Semaglutide,0.25 or 0.5MG /DOS, (OZEMPIC, 0.25 OR 0.5 MG/DOSE,) 2 MG/1.5ML SOPN   glipiZIDE (GLUCOTROL XL) 2.5 MG 24 hr tablet   Insulin Glargine (LANTUS) 100 UNIT/ML Solostar Pen   Other Relevant Orders   Amb Referral to Bariatric Surgery    Other Visit Diagnoses    BMI 50.0-59.9, adult (HCC)       Relevant Medications   Semaglutide,0.25 or 0.5MG /DOS, (OZEMPIC, 0.25 OR 0.5 MG/DOSE,) 2 MG/1.5ML SOPN   glipiZIDE (GLUCOTROL XL) 2.5 MG 24 hr tablet   Insulin Glargine (LANTUS) 100 UNIT/ML Solostar Pen   Other Relevant Orders   Amb Referral to Bariatric Surgery      Follow-up: Return in about 4 weeks (around 05/04/2019) for CPE (fasting, F2F).  [name], NP

## 2019-04-07 ENCOUNTER — Encounter: Payer: Self-pay | Admitting: Nurse Practitioner

## 2019-04-07 LAB — BASIC METABOLIC PANEL
BUN/Creatinine Ratio: 16 (ref 9–23)
BUN: 9 mg/dL (ref 6–24)
CO2: 24 mmol/L (ref 20–29)
Calcium: 9.4 mg/dL (ref 8.7–10.2)
Chloride: 103 mmol/L (ref 96–106)
Creatinine, Ser: 0.58 mg/dL (ref 0.57–1.00)
GFR calc Af Amer: 117 mL/min/{1.73_m2} (ref >59)
GFR calc non Af Amer: 102 mL/min/{1.73_m2} (ref >59)
Glucose: 308 mg/dL — ABNORMAL HIGH (ref 65–99)
Potassium: 4.6 mmol/L (ref 3.5–5.2)
Sodium: 141 mmol/L (ref 134–144)

## 2019-04-07 LAB — LIPASE: Lipase: 43 U/L (ref 14–72)

## 2019-04-07 LAB — AMYLASE: Amylase: 32 U/L (ref 31–110)

## 2019-04-08 ENCOUNTER — Encounter: Payer: Self-pay | Admitting: Nurse Practitioner

## 2019-04-08 NOTE — Assessment & Plan Note (Signed)
Home glucose AM and PM:>250, no hypoglycemia. She admits to non compliance with diet. Has not scheduled f/up appt with weight loss clinic. Normal amylase and lipase, normal renal function Maintain metformin and glipizide Check glucose AM and PM Continue lantus at 10units at hs, hold if AM glucose is <200 Hold glipizide if AM glucose <150 Start Ozempic injection once a week. F/up in 56month

## 2019-04-14 ENCOUNTER — Encounter: Payer: Self-pay | Admitting: Nurse Practitioner

## 2019-04-14 ENCOUNTER — Encounter (INDEPENDENT_AMBULATORY_CARE_PROVIDER_SITE_OTHER): Payer: Self-pay | Admitting: Bariatrics

## 2019-04-14 ENCOUNTER — Other Ambulatory Visit: Payer: Self-pay

## 2019-04-14 ENCOUNTER — Ambulatory Visit (INDEPENDENT_AMBULATORY_CARE_PROVIDER_SITE_OTHER): Payer: Managed Care, Other (non HMO) | Admitting: Bariatrics

## 2019-04-14 ENCOUNTER — Other Ambulatory Visit (INDEPENDENT_AMBULATORY_CARE_PROVIDER_SITE_OTHER): Payer: Self-pay | Admitting: Bariatrics

## 2019-04-14 VITALS — BP 143/77 | HR 83 | Temp 98.0°F | Ht 64.0 in | Wt 333.0 lb

## 2019-04-14 DIAGNOSIS — Z9189 Other specified personal risk factors, not elsewhere classified: Secondary | ICD-10-CM | POA: Diagnosis not present

## 2019-04-14 DIAGNOSIS — E1165 Type 2 diabetes mellitus with hyperglycemia: Secondary | ICD-10-CM | POA: Diagnosis not present

## 2019-04-14 DIAGNOSIS — E559 Vitamin D deficiency, unspecified: Secondary | ICD-10-CM

## 2019-04-14 DIAGNOSIS — F3289 Other specified depressive episodes: Secondary | ICD-10-CM

## 2019-04-14 DIAGNOSIS — Z6841 Body Mass Index (BMI) 40.0 and over, adult: Secondary | ICD-10-CM

## 2019-04-14 MED ORDER — VITAMIN D (ERGOCALCIFEROL) 1.25 MG (50000 UNIT) PO CAPS: 50000.0000 [IU] | ORAL_CAPSULE | ORAL | 0 refills | Status: DC

## 2019-04-14 NOTE — Progress Notes (Signed)
Chief Complaint:   OBESITY Ruth [name] is here to discuss her progress with her obesity treatment plan along with follow-up of her obesity related diagnoses. Ruth [name] is on the Category 3 Plan and states she is following her eating plan approximately 60% of the time. Ruth [name] states she is exercising 0 minutes 0 times per week.  Today's visit was #: 10 Starting weight: 336 lbs Starting date: 09/07/2018 Today's weight: 333 lbs Today's date: 04/14/2019 Total lbs lost to date: 3 Total lbs lost since last in-office visit: 1  Interim History: Ruth [name] is down 1 lb. She went to the bariatric surgical seminar.  Subjective:   Vitamin D deficiency. No nausea, vomiting, or muscle weakness. Last Vitamin D 24.9 on 09/07/2018.  Type 2 diabetes mellitus with hyperglycemia, without long-term current use of insulin ([place]). Fasting blood sugars are in the 200's. Ruth [name] is taking Lantus 10 units, glipizide, metformin, and Ozempic.   Lab Results  Component Value Date   HGBA1C 11.2 (H) 03/06/2019   HGBA1C 11.6 (H) 02/10/2019   HGBA1C 8.3 (H) 08/21/2018   Lab Results  Component Value Date   MICROALBUR 0.4 08/21/2018   LDLCALC 138 (H) 08/21/2018   CREATININE 0.58 04/06/2019   No results found for: INSULIN  Other depression, with emotional eating. Ruth [name] is struggling with emotional eating and using food for comfort to the extent that it is negatively impacting her health. She has been working on behavior modification techniques to help reduce her emotional eating and has been somewhat successful. She shows no sign of suicidal or homicidal ideations. Ruth [name] is taking Wellbutrin.  At risk for osteoporosis. Ruth [name] is at higher risk of osteopenia and osteoporosis due to Vitamin D deficiency.   Assessment/Plan:   Vitamin D deficiency. Low Vitamin D level contributes to fatigue and are associated with obesity, breast, and colon cancer. She was given a prescription for Vitamin D, Ergocalciferol,  (DRISDOL) 1.25 MG (50000 UNIT) CAPS capsule every week #4 with 0 refills and will follow-up for routine testing of Vitamin D, at least 2-3 times per year to avoid over-replacement.   Type 2 diabetes mellitus with hyperglycemia, without long-term current use of insulin ([place]). Good blood sugar control is important to decrease the likelihood of diabetic complications such as nephropathy, neuropathy, limb loss, blindness, coronary artery disease, and death. Intensive lifestyle modification including diet, exercise and weight loss are the first line of treatment for diabetes. [name] will continue all of her medications as directed. She will check fasting blood sugars and 2-hour postprandials.  Other depression, with emotional eating. Behavior modification techniques were discussed today to help Ruth [name] deal with her emotional/non-hunger eating behaviors.  Orders and follow up as documented in patient record. She will continue taking Wellbutrin as directed.  At risk for osteoporosis. Ruth [name] was given approximately 15 minutes of osteoporosis prevention counseling today. Ruth [name] is at risk for osteopenia and osteoporosis due to her Vitamin D deficiency. She was encouraged to take her Vitamin D and follow her higher calcium diet and increase strengthening exercise to help strengthen her bones and decrease her risk of osteopenia and osteoporosis.  Repetitive spaced learning was employed today to elicit superior memory formation and behavioral change.  Class 3 severe obesity with serious comorbidity and body mass index (BMI) of 50.0 to 59.9 in adult, unspecified obesity type ([place]).  Ruth [name] is currently in the action stage of change. As such, her goal is to continue with weight loss efforts. She has agreed to the Category 3  Plan.   She will work on meal planning, mindful eating, and increasing her water and protein intake.  Exercise goals: All adults should avoid inactivity. Some physical activity is better than  none, and adults who participate in any amount of physical activity gain some health benefits.  Behavioral modification strategies: increasing lean protein intake, decreasing simple carbohydrates, increasing vegetables, increasing water intake, decreasing eating out, no skipping meals, meal planning and cooking strategies and keeping healthy foods in the home.  Ruth [name] has agreed to follow-up with our clinic in 2 weeks. She was informed of the importance of frequent follow-up visits to maximize her success with intensive lifestyle modifications for her multiple health conditions.   Objective:   Blood pressure (!) 143/77, pulse 83, temperature 98 F (36.7 C), height 5\' 4"  (1.626 m), SpO2 99 %. Body mass index is 58.33 kg/m.  General: Cooperative, alert, well developed, in no acute distress. HEENT: Conjunctivae and lids unremarkable. Cardiovascular: Regular rhythm.  Lungs: Normal work of breathing. Neurologic: No focal deficits.   Lab Results  Component Value Date   CREATININE 0.58 04/06/2019   BUN 9 04/06/2019   NA 141 04/06/2019   K 4.6 04/06/2019   CL 103 04/06/2019   CO2 24 04/06/2019   Lab Results  Component Value Date   ALT 34 03/09/2019   AST 24 03/09/2019   ALKPHOS 78 03/09/2019   BILITOT 0.4 03/09/2019   Lab Results  Component Value Date   HGBA1C 11.2 (H) 03/06/2019   HGBA1C 11.6 (H) 02/10/2019   HGBA1C 8.3 (H) 08/21/2018   HGBA1C 6.2 (A) 02/03/2018   HGBA1C 6.5 (H) 08/22/2017   No results found for: INSULIN Lab Results  Component Value Date   TSH 1.760 09/07/2018   Lab Results  Component Value Date   CHOL 210 (H) 08/21/2018   HDL 52 08/21/2018   LDLCALC 138 (H) 08/21/2018   TRIG 95 08/21/2018   CHOLHDL 4.0 08/21/2018   Lab Results  Component Value Date   WBC 5.7 03/09/2019   HGB 12.6 03/09/2019   HCT 39.3 03/09/2019   MCV 90.3 03/09/2019   PLT 343 03/09/2019   Lab Results  Component Value Date   FERRITIN 837 (H) 03/08/2019   Attestation  Statements:   Reviewed by clinician on day of visit: allergies, medications, problem list, medical history, surgical history, family history, social history, and previous encounter notes.  Migdalia Dk, am acting as Location manager for [employer], DO   I have reviewed the above documentation for accuracy and completeness, and I agree with the above. Jearld Lesch, DO

## 2019-04-15 ENCOUNTER — Encounter (INDEPENDENT_AMBULATORY_CARE_PROVIDER_SITE_OTHER): Payer: Self-pay | Admitting: Bariatrics

## 2019-04-16 ENCOUNTER — Ambulatory Visit: Payer: Managed Care, Other (non HMO) | Attending: Internal Medicine

## 2019-04-16 DIAGNOSIS — Z23 Encounter for immunization: Secondary | ICD-10-CM

## 2019-04-16 NOTE — Progress Notes (Signed)
   Covid-19 Vaccination Clinic  Name:  Ruth [name]    MRN: [id number] DOB: January 10, 1961  04/16/2019  [name] was observed post Covid-19 immunization for 15 minutes without incident. She was provided with Vaccine Information Sheet and instruction to access the V-Safe system.   [name] was instructed to call 911 with any severe reactions post vaccine: Marland Kitchen Difficulty breathing  . Swelling of face and throat  . A fast heartbeat  . A bad rash all over body  . Dizziness and weakness   Immunizations Administered    Name Date Dose VIS Date Route   Pfizer COVID-19 Vaccine 04/16/2019 11:44 AM 0.3 mL 01/22/2019 Intramuscular   Manufacturer: [place]   Lot: [id number]   [place]: [id number]

## 2019-04-20 ENCOUNTER — Encounter: Payer: Self-pay | Admitting: Podiatry

## 2019-04-20 ENCOUNTER — Ambulatory Visit: Payer: Managed Care, Other (non HMO) | Admitting: Podiatry

## 2019-04-20 ENCOUNTER — Other Ambulatory Visit: Payer: Self-pay

## 2019-04-20 DIAGNOSIS — Z09 Encounter for follow-up examination after completed treatment for conditions other than malignant neoplasm: Secondary | ICD-10-CM

## 2019-04-20 DIAGNOSIS — M76821 Posterior tibial tendinitis, right leg: Secondary | ICD-10-CM

## 2019-04-20 DIAGNOSIS — M722 Plantar fascial fibromatosis: Secondary | ICD-10-CM

## 2019-04-21 ENCOUNTER — Telehealth: Payer: Self-pay | Admitting: *Deleted

## 2019-04-21 ENCOUNTER — Encounter: Payer: Self-pay | Admitting: Podiatry

## 2019-04-21 NOTE — Telephone Encounter (Signed)
Pt states the note written 04/20/2019 did not have the return to work date, and needs clarification of the walking status. Dr. Milinda Pointer updated the 04/20/2019 letter.

## 2019-04-21 NOTE — Progress Notes (Signed)
She presents today date of surgery 10/30/2018 status post EPF peroneal tendon repair on the right foot.  States that she developed developed Covid and was in the hospital for several days and had to stop physical therapy.  She states that she is doing much better feels that she is about 85% better now than she was prior to surgery.  She denies fever chills nausea vomiting muscle aches pains calf pain back pain chest pain shortness of breath.  States that she can only stand for about 2 hours.  She states that after walking for a period of time her foot becomes painful.  Objective: Vital signs are stable alert oriented x3.  Pulses are palpable.  Neurologic sensorium is intact.  Deep tendon reflexes are intact.  Muscle strength is normal and symmetrical.  She has much decrease in edema she has good range of motion and good muscle strength to the right lower extremity.  Assessment: Slowly healing right foot.  85% improved  Plan: At this point I will asked that she continue physical therapy.  Patient would like to go back to work however she is curious as to whether they would let her go back in be limited to only 2 hours of walking.  She is welcome to try if she desires otherwise she will have to remain out of work until she feels that she can perform her duties.  I will follow-up with her in 6 weeks

## 2019-04-23 ENCOUNTER — Encounter: Payer: Self-pay | Admitting: Podiatry

## 2019-04-26 ENCOUNTER — Encounter: Payer: Self-pay | Admitting: Nurse Practitioner

## 2019-04-27 ENCOUNTER — Other Ambulatory Visit: Payer: Self-pay

## 2019-05-03 ENCOUNTER — Other Ambulatory Visit: Payer: Self-pay

## 2019-05-04 ENCOUNTER — Ambulatory Visit (INDEPENDENT_AMBULATORY_CARE_PROVIDER_SITE_OTHER): Payer: Managed Care, Other (non HMO) | Admitting: Nurse Practitioner

## 2019-05-04 ENCOUNTER — Encounter: Payer: Self-pay | Admitting: Nurse Practitioner

## 2019-05-04 ENCOUNTER — Ambulatory Visit: Payer: Managed Care, Other (non HMO) | Admitting: Podiatry

## 2019-05-04 VITALS — BP 128/82 | HR 88 | Temp 96.9°F | Ht 64.0 in | Wt 333.4 lb

## 2019-05-04 DIAGNOSIS — I10 Essential (primary) hypertension: Secondary | ICD-10-CM | POA: Diagnosis not present

## 2019-05-04 DIAGNOSIS — Z1231 Encounter for screening mammogram for malignant neoplasm of breast: Secondary | ICD-10-CM | POA: Diagnosis not present

## 2019-05-04 DIAGNOSIS — Z0001 Encounter for general adult medical examination with abnormal findings: Secondary | ICD-10-CM | POA: Diagnosis not present

## 2019-05-04 DIAGNOSIS — E782 Mixed hyperlipidemia: Secondary | ICD-10-CM

## 2019-05-04 DIAGNOSIS — E1165 Type 2 diabetes mellitus with hyperglycemia: Secondary | ICD-10-CM

## 2019-05-04 MED ORDER — OZEMPIC (0.25 OR 0.5 MG/DOSE) 2 MG/1.5ML ~~LOC~~ SOPN: 0.5000 mg | PEN_INJECTOR | SUBCUTANEOUS | 1 refills | Status: DC

## 2019-05-04 NOTE — Progress Notes (Signed)
Subjective:    Patient ID: Ruth [name], female    DOB: 1960/04/03, 59 y.o.   MRN: [id number]  Patient presents today for complete physical and DM eval  HPI  DM: Uncontrolled Home glucose in last 2weeks: 180s-210s (fasting) Reports mild nausea with Ozempic, no ABD pain, no constipation. Eye exam: will schedule Foot exam: completed BP at goal LDL not at goal negative nephropathy Negative neuropathy  BP Readings from Last 3 Encounters:  05/04/19 128/82  04/14/19 (!) 143/77  04/06/19 132/82   Wt Readings from Last 3 Encounters:  05/04/19 (!) 333 lb 6.4 oz (151.2 kg)  04/14/19 (!) 333 lb (151 kg)  04/06/19 (!) 339 lb 12.8 oz (154.1 kg)   Lipid Panel     Component Value Date/Time   CHOL 210 (H) 08/21/2018 1401   CHOL 186 08/22/2017 0931   TRIG 95 08/21/2018 1401   HDL 52 08/21/2018 1401   HDL 62 08/22/2017 0931   CHOLHDL 4.0 08/21/2018 1401   VLDL 22.0 08/02/2016 1659   LDLCALC 138 (H) 08/21/2018 1401   LABVLDL 10 08/22/2017 0931   Sexual History (orientation,birth control, marital status, STD):postmenopausal, up to date with PAP, needs breast exam and mammogram. Up to date with colonoscopy.  Depression/Suicide: Depression screen [facility] 2/9 05/04/2019 02/10/2019 09/07/2018 08/21/2018 08/22/2017 04/29/2016  Decreased Interest 0 0 3 0 0 0  Down, Depressed, Hopeless 0 0 2 0 0 0  PHQ - 2 Score 0 0 5 0 0 0  Altered sleeping - - 3 - - -  Tired, decreased energy - - 3 - - -  Change in appetite - - 3 - - -  Feeling bad or failure about yourself  - - 3 - - -  Trouble concentrating - - 0 - - -  Moving slowly or fidgety/restless - - 3 - - -  Suicidal thoughts - - 0 - - -  PHQ-9 Score - - 20 - - -  Difficult doing work/chores - - Somewhat difficult - - -   Immunizations: (TDAP, Hep C screen, Pneumovax, Influenza, zoster)  Health Maintenance  Topic Date Due  . Eye exam for diabetics  Never done  . Urine Protein Check  08/21/2019  . Hemoglobin A1C  09/03/2019  . Mammogram   09/17/2019  . Complete foot exam   05/03/2020  . Pap Smear  08/22/2020  . Colon Cancer Screening  10/30/2026  . Tetanus Vaccine  10/25/2028  . Flu Shot  Completed  . Pneumococcal vaccine  Completed  .  Hepatitis C: One time screening is recommended by Center for Disease Control  (CDC) for  adults born from [birth year] through 1965.   Completed  . HIV Screening  Completed   Fall Risk: Fall Risk  05/04/2019 02/10/2019 08/21/2018 08/22/2017 04/29/2016  Falls in the past year? - 0 0 No No  Number falls in past yr: 0 - - - -  Injury with Fall? 0 - - - -   Advanced Directive: Advanced Directives 03/06/2019  Does Patient Have a Medical Advance Directive? No  Would patient like information on creating a medical advance directive? Yes (Inpatient - patient requests chaplain consult to create a medical advance directive)     Medications and allergies reviewed with patient and updated if appropriate.  Patient Active Problem List   Diagnosis Date Noted  . Vitamin D deficiency 11/04/2018  . Class 3 severe obesity with serious comorbidity and body mass index (BMI) of 50.0 to 59.9 in adult ([place]) 10/28/2018  .  Drug-induced constipation 10/26/2018  . Breast mass, right 12/18/2017  . Chronic bilateral low back pain with right-sided sciatica 10/31/2017  . Spinal stenosis of lumbosacral region 10/31/2017  . DDD (degenerative disc disease), lumbosacral 10/31/2017  . Controlled substance agreement signed 10/31/2017  . Acute right-sided low back pain with right-sided sciatica 08/11/2017  . Hematochezia   . Benign neoplasm of colon   . Benign neoplasm of transverse colon   . Benign neoplasm of descending colon   . Benign neoplasm of rectum   . Lumbar paraspinal muscle spasm 07/31/2016  . DM (diabetes mellitus) ([place]) 04/29/2016  . HTN (hypertension), benign 04/29/2016  . Hyperlipidemia 04/29/2016  . Morbid obesity with BMI of 50.0-59.9, adult ([place]) 04/29/2016    Current Outpatient Medications on File Prior  to Visit  Medication Sig Dispense Refill  . amLODipine (NORVASC) 5 MG tablet Take 1 tablet (5 mg total) by mouth at bedtime. 90 tablet 1  . Ascorbic Acid (VITAMIN C PO) Take 1 tablet by mouth daily.    Marland Kitchen atenolol (TENORMIN) 50 MG tablet Take 1 tablet (50 mg total) by mouth daily. 90 tablet 1  . atorvastatin (LIPITOR) 40 MG tablet Take 1 tablet (40 mg total) by mouth daily at 6 PM. 90 tablet 3  . BLACK ELDERBERRY PO Take 2 mLs by mouth daily.    Marland Kitchen buPROPion (WELLBUTRIN SR) 150 MG 12 hr tablet Take 1 tablet (150 mg total) by mouth daily. 30 tablet 0  . glipiZIDE (GLUCOTROL XL) 2.5 MG 24 hr tablet Take 1 tablet (2.5 mg total) by mouth daily with breakfast. With heaviest meals 90 tablet 1  . glucose blood test strip Check blood glucose before breakfast and at bedtime. Accu Chek test strips 100 each 12  . ibuprofen (ADVIL) 800 MG tablet Take 800 mg by mouth every 8 (eight) hours as needed (pain).    . Insulin Glargine (LANTUS) 100 UNIT/ML Solostar Pen Inject 10 Units into the skin daily. 15 mL 0  . Insulin Pen Needle (NOVOFINE) 30G X 8 MM MISC Inject 10 each into the skin as needed. 1 packet 2  . metFORMIN (GLUCOPHAGE) 850 MG tablet Take 1 tablet (850 mg total) by mouth 2 (two) times daily with a meal. 60 tablet 5  . Multiple Vitamins-Minerals (MULTIVITAMIN WITH MINERALS) tablet Take 1 tablet by mouth daily.    . traMADol (ULTRAM) 50 MG tablet Take 50 mg by mouth every 6 (six) hours as needed (pain).     . Vitamin D, Ergocalciferol, (DRISDOL) 1.25 MG (50000 UNIT) CAPS capsule Take 1 capsule (50,000 Units total) by mouth every 7 (seven) days. 4 capsule 0  . famotidine (PEPCID) 20 MG tablet Take 1 tablet (20 mg total) by mouth daily for 10 days. (Patient not taking: Reported on 05/04/2019) 10 tablet 0   No current facility-administered medications on file prior to visit.    Past Medical History:  Diagnosis Date  . Anemia   . Arthritis   . Back pain   . Bilateral calcaneal spurs   . Diabetes  mellitus without complication ([place])    type 2  . GERD (gastroesophageal reflux disease)   . Gout   . Hyperlipidemia   . Hypertension   . Obesity   . Osteoarthritis   . Palpitations   . Swallowing difficulty   . Vitamin D deficiency     Past Surgical History:  Procedure Laterality Date  . ABDOMINAL HYSTERECTOMY     partial  . CHOLECYSTECTOMY    . COLONOSCOPY  WITH PROPOFOL N/A 10/29/2016   Procedure: COLONOSCOPY WITH PROPOFOL;  Surgeon: Yetta Flock, MD;  Location: [facility];  Service: Gastroenterology;  Laterality: N/A;  . DENTAL SURGERY    . ORIF TIBIA & FIBULA FRACTURES Left     Social History   Socioeconomic History  . Marital status: Married    Spouse name: Nadara Mustard  . Number of children: 3  . Years of education: Not on file  . Highest education level: Not on file  Occupational History  . Occupation: Reference Clerk  Tobacco Use  . Smoking status: Former Smoker    Packs/day: 1.00    Years: 20.00    Pack years: 20.00    Quit date: 02/12/2008    Years since quitting: 11.2  . Smokeless tobacco: Never Used  Substance and Sexual Activity  . Alcohol use: No  . Drug use: No  . Sexual activity: Yes    Birth control/protection: Surgical, Post-menopausal  Other Topics Concern  . Not on file  Social History Narrative  . Not on file   Social Determinants of Health   Financial Resource Strain:   . Difficulty of Paying Living Expenses:   Food Insecurity:   . Worried About Charity fundraiser in the Last Year:   . Arboriculturist in the Last Year:   Transportation Needs:   . Film/video editor (Medical):   Marland Kitchen Lack of Transportation (Non-Medical):   Physical Activity:   . Days of Exercise per Week:   . Minutes of Exercise per Session:   Stress:   . Feeling of Stress :   Social Connections:   . Frequency of Communication with Friends and Family:   . Frequency of Social Gatherings with Friends and Family:   . Attends Religious Services:   . Active  Member of Clubs or Organizations:   . Attends Archivist Meetings:   Marland Kitchen Marital Status:     Family History  Problem Relation Age of Onset  . Diabetes Mother   . Stroke Mother   . Dementia Mother   . Hypertension Mother   . Eating disorder Mother   . Obesity Mother   . Pancreatic cancer Father   . Sudden death Father   . Alcoholism Father   . Colon polyps Sister   . Kidney disease Sister   . Cancer Maternal Grandmother        type unknown        Review of Systems  Constitutional: Negative for fever, malaise/fatigue and weight loss.  HENT: Negative for congestion and sore throat.   Eyes:       Negative for visual changes  Respiratory: Negative for cough and shortness of breath.   Cardiovascular: Negative for chest pain, palpitations and leg swelling.  Gastrointestinal: Negative for blood in stool, constipation, diarrhea and heartburn.  Genitourinary: Negative for dysuria, frequency and urgency.  Musculoskeletal: Negative for falls, joint pain and myalgias.  Skin: Negative for rash.  Neurological: Negative for dizziness, sensory change and headaches.  Endo/Heme/Allergies: Does not bruise/bleed easily.  Psychiatric/Behavioral: Negative for depression, substance abuse and suicidal ideas. The patient is not nervous/anxious.     Objective:   Vitals:   05/04/19 0817  BP: 128/82  Pulse: 88  Temp: (!) 96.9 F (36.1 C)  SpO2: 97%   Body mass index is 57.23 kg/m.  Physical Examination:  Physical Exam Vitals reviewed. Exam conducted with a chaperone present.  Constitutional:      Appearance: She is obese.  HENT:     Right Ear: Tympanic membrane, ear canal and external ear normal.     Left Ear: Tympanic membrane, ear canal and external ear normal.  Neck:     Thyroid: No thyroid mass, thyromegaly or thyroid tenderness.  Cardiovascular:     Rate and Rhythm: Normal rate and regular rhythm.     Pulses: Normal pulses.     Heart sounds: Normal heart sounds.    Pulmonary:     Effort: Pulmonary effort is normal.     Breath sounds: Normal breath sounds.  Chest:     Breasts:        Right: Normal.        Left: Normal.  Abdominal:     General: Bowel sounds are normal. There is no distension.     Palpations: Abdomen is soft.     Tenderness: There is no abdominal tenderness. There is no guarding.  Genitourinary:    Comments: declined Musculoskeletal:     Cervical back: Normal range of motion and neck supple.     Right lower leg: No edema.     Left lower leg: No edema.  Lymphadenopathy:     Cervical: No cervical adenopathy.     Upper Body:     Right upper body: No supraclavicular, axillary or pectoral adenopathy.     Left upper body: No supraclavicular, axillary or pectoral adenopathy.  Skin:    General: Skin is warm and dry.     Findings: No erythema or rash.     Comments: Normal DM foot exam  Neurological:     Mental Status: She is alert and oriented to person, place, and time.  Psychiatric:        Mood and Affect: Mood normal.        Behavior: Behavior normal.        Thought Content: Thought content normal.     ASSESSMENT and PLAN: This visit occurred during the SARS-CoV-2 public health emergency.  Safety protocols were in place, including screening questions prior to the visit, additional usage of staff PPE, and extensive cleaning of exam room while observing appropriate contact time as indicated for disinfecting solutions.   Sukanya was seen today for annual exam.  Diagnoses and all orders for this visit:  Encounter for preventative adult health care exam with abnormal findings  HTN (hypertension), benign  Type 2 diabetes mellitus with hyperglycemia, without long-term current use of insulin ([place]) -     Cancel: Hepatic function panel -     Semaglutide,0.25 or 0.5MG /DOS, (OZEMPIC, 0.25 OR 0.5 MG/DOSE,) 2 MG/1.5ML SOPN; Inject 0.5 mg into the skin once a week. -     Hepatic function panel  Mixed hyperlipidemia -     Cancel:  Lipid panel -     Cancel: Hepatic function panel -     Hepatic function panel -     Lipid panel  Breast cancer screening by mammogram -     MM DIGITAL SCREENING BILATERAL; Future    DM (diabetes mellitus) ([place]) Uncontrolled with glucose: 180s-200s Current use of lantus, glipizide, metformin, and ozempic 2.5mg   Increase ozempic to 0.5mg  weekly F/up in 36months Continue appts with weight loss clinic. She is to schedule annual eye exam      Problem List Items Addressed This Visit      Cardiovascular and Mediastinum   HTN (hypertension), benign     Endocrine   DM (diabetes mellitus) ([place])    Uncontrolled with glucose: 180s-200s Current use of lantus, glipizide,  metformin, and ozempic 2.5mg   Increase ozempic to 0.5mg  weekly F/up in 32months Continue appts with weight loss clinic. She is to schedule annual eye exam      Relevant Medications   Semaglutide,0.25 or 0.5MG /DOS, (OZEMPIC, 0.25 OR 0.5 MG/DOSE,) 2 MG/1.5ML SOPN   Other Relevant Orders   Hepatic function panel     Other   Hyperlipidemia   Relevant Orders   Hepatic function panel   Lipid panel    Other Visit Diagnoses    Encounter for preventative adult health care exam with abnormal findings    -  Primary   Breast cancer screening by mammogram       Relevant Orders   MM DIGITAL SCREENING BILATERAL      Follow up: Return in about 3 months (around 08/04/2019) for DM and HTN, hyperlipidemia (F43f, 32mins).  [name], NP

## 2019-05-04 NOTE — Patient Instructions (Addendum)
Go to lab for blood draw.  You will be contacted to schedule appt for mammogram.  Schedule annual eye exam with ophthalmology.  Resume lantus daily. Increase ozempic dose to 0.5mg 

## 2019-05-04 NOTE — Assessment & Plan Note (Addendum)
Uncontrolled with glucose: 180s-200s Current use of lantus, glipizide, metformin, and ozempic 2.5mg   Increase ozempic to 0.5mg  weekly F/up in 3months Continue appts with weight loss clinic. She is to schedule annual eye exam

## 2019-05-05 LAB — LIPID PANEL
Chol/HDL Ratio: 2.7 ratio (ref 0.0–4.4)
Cholesterol, Total: 129 mg/dL (ref 100–199)
HDL: 48 mg/dL (ref >39)
LDL Chol Calc (NIH): 67 mg/dL (ref 0–99)
Triglycerides: 68 mg/dL (ref 0–149)
VLDL Cholesterol Cal: 14 mg/dL (ref 5–40)

## 2019-05-05 LAB — HEPATIC FUNCTION PANEL
ALT: 25 IU/L (ref 0–32)
AST: 24 IU/L (ref 0–40)
Albumin: 4.1 g/dL (ref 3.8–4.9)
Alkaline Phosphatase: 88 IU/L (ref 39–117)
Bilirubin Total: 0.4 mg/dL (ref 0.0–1.2)
Bilirubin, Direct: 0.15 mg/dL (ref 0.00–0.40)
Total Protein: 6.7 g/dL (ref 6.0–8.5)

## 2019-05-18 ENCOUNTER — Ambulatory Visit: Payer: Managed Care, Other (non HMO) | Attending: Internal Medicine

## 2019-05-18 DIAGNOSIS — Z23 Encounter for immunization: Secondary | ICD-10-CM

## 2019-05-18 NOTE — Progress Notes (Signed)
   Covid-19 Vaccination Clinic  Name:  Ruth [name]    MRN: [id number] DOB: [birth year]  05/18/2019  Ruth [name] was observed post Covid-19 immunization for 30 minutes based on pre-vaccination screening without incident. She was provided with Vaccine Information Sheet and instruction to access the V-Safe system.   Ruth [name] was instructed to call 911 with any severe reactions post vaccine: Marland Kitchen Difficulty breathing  . Swelling of face and throat  . A fast heartbeat  . A bad rash all over body  . Dizziness and weakness   Immunizations Administered    Name Date Dose VIS Date Route   Pfizer COVID-19 Vaccine 05/18/2019  4:48 PM 0.3 mL 01/22/2019 Intramuscular   Manufacturer: [place]   Lot: [id number]   [place]: [id number]

## 2019-05-24 ENCOUNTER — Other Ambulatory Visit: Payer: Self-pay

## 2019-05-24 ENCOUNTER — Encounter: Payer: Self-pay | Admitting: Nurse Practitioner

## 2019-05-24 ENCOUNTER — Telehealth (INDEPENDENT_AMBULATORY_CARE_PROVIDER_SITE_OTHER): Payer: Managed Care, Other (non HMO) | Admitting: Nurse Practitioner

## 2019-05-24 VITALS — BP 144/89 | HR 83 | Ht 64.0 in

## 2019-05-24 DIAGNOSIS — R110 Nausea: Secondary | ICD-10-CM | POA: Diagnosis not present

## 2019-05-24 DIAGNOSIS — E1169 Type 2 diabetes mellitus with other specified complication: Secondary | ICD-10-CM

## 2019-05-24 MED ORDER — ONDANSETRON HCL 4 MG PO TABS: 4.0000 mg | ORAL_TABLET | Freq: Three times a day (TID) | ORAL | 0 refills | Status: DC | PRN

## 2019-05-24 NOTE — Patient Instructions (Addendum)
Nausea is due to use of Ozempic. Since symptoms are mild maintain current medications with parameters. Schedule alb appt for repeat liver and pancreatic enzymes. Maintain upcoming appt  With me Call office if glucose<100 Use zofran prn for nausea.

## 2019-05-24 NOTE — Progress Notes (Signed)
Virtual Visit via Video Note  I connected with@ on 05/28/19 at  9:00 AM EDT by a video enabled telemedicine application and verified that I am speaking with the correct person using two identifiers.  Location: Patient:Home Provider: Office Participants: patient and provider  I discussed the limitations of evaluation and management by telemedicine and the availability of in person appointments. I also discussed with the patient that there may be a patient responsible charge related to this service. The patient expressed understanding and agreed to proceed.  [id number]  History of Present Illness: GI Problem The primary symptoms include nausea. Primary symptoms do not include fever, weight loss, fatigue, abdominal pain, vomiting, diarrhea or myalgias. The illness began more than 7 days ago. The onset was gradual.  The illness does not include chills, anorexia, dysphagia, odynophagia, bloating, constipation, back pain or itching. Associated medical issues do not include GERD, PUD or irritable bowel syndrome.  She is concerned nausea may be related to COVID vaccine No constipation, no GERD, no vomiting, no ABD pain   Observations/Objective: Physical Exam  Constitutional: No distress.  Pulmonary/Chest: Effort normal.  Abdominal: She exhibits no distension. There is no abdominal tenderness.  Skin: She is not diaphoretic.   Assessment and Plan: Ruth [name] was seen today for nausea.  Diagnoses and all orders for this visit:  Nausea -     ondansetron (ZOFRAN) 4 MG tablet; Take 1 tablet (4 mg total) by mouth every 8 (eight) hours as needed for nausea or vomiting. -     Hepatic function panel; Future -     Lipase; Future -     Amylase; Future  Type 2 diabetes mellitus with other specified complication, without long-term current use of insulin (HCC) -     Hepatic function panel; Future -     Lipase; Future -     Amylase; Future   Follow Up Instructions: Nausea is due to use of Ozempic.  Since symptoms are mild maintain current medications with parameters. Schedule alb appt for repeat liver and pancreatic enzymes. Maintain upcoming appt  With me Call office if glucose<100 Use zofran prn for nausea.  I discussed the assessment and treatment plan with the patient. The patient was provided an opportunity to ask questions and all were answered. The patient agreed with the plan and demonstrated an understanding of the instructions.   The patient was advised to call back or seek an in-person evaluation if the symptoms worsen or if the condition fails to improve as anticipated.  I provided 11 minutes of non-face-to-face time during this encounter.  [name], NP

## 2019-05-28 ENCOUNTER — Telehealth: Payer: Self-pay | Admitting: Nurse Practitioner

## 2019-05-28 NOTE — Telephone Encounter (Addendum)
Pt called back and is aware that she needs to schedule a lab, she is going to check her schedule and call back to schedule lab

## 2019-05-28 NOTE — Telephone Encounter (Signed)
Call patient and schedule lab appt for liver and pancreatic enzymes check due to current use of ozempic injection. Thank you

## 2019-05-28 NOTE — Telephone Encounter (Signed)
Left voicemail for pt to call us back

## 2019-06-01 ENCOUNTER — Encounter: Payer: Self-pay | Admitting: Podiatry

## 2019-06-01 ENCOUNTER — Ambulatory Visit (INDEPENDENT_AMBULATORY_CARE_PROVIDER_SITE_OTHER): Payer: Managed Care, Other (non HMO) | Admitting: Podiatry

## 2019-06-01 ENCOUNTER — Other Ambulatory Visit: Payer: Self-pay

## 2019-06-01 DIAGNOSIS — M722 Plantar fascial fibromatosis: Secondary | ICD-10-CM | POA: Diagnosis not present

## 2019-06-01 DIAGNOSIS — Z09 Encounter for follow-up examination after completed treatment for conditions other than malignant neoplasm: Secondary | ICD-10-CM

## 2019-06-01 DIAGNOSIS — M76821 Posterior tibial tendinitis, right leg: Secondary | ICD-10-CM

## 2019-06-01 NOTE — Telephone Encounter (Signed)
Pt called the office and knows to schedule lab appointment due to Ozempic injection use to check pancreatic and liver enyzymes. Pt was also sent a My Chart reminder as well.

## 2019-06-01 NOTE — Progress Notes (Signed)
She presents today for her posterior tibial tendon repair and repair of her peroneal tendons date of surgery October 30, 2018.  She states that it was really [place] when she went back to work but everything seems to be doing pretty well now.  States that she went to [facility] and for 1200 but she was [place] to consider purchasing orthotics.  She states that she is 95% improved.  Objective: Vital signs are stable she is alert and oriented x3 there is no erythema edema cellulitis drainage or odor she appears to be healing very nicely incision sites have gone to heal uneventfully she has good plantar flexion and inversion as well as plantarflexion and eversion without pain.  Assessment: Well-healing surgical foot right.  She states that she is approximately 95% improved.  Plan: At this point I recommended orthotics from [place] which would be much less expensive she understands that and is amenable to it I will follow-up with her as needed

## 2019-06-04 ENCOUNTER — Encounter: Payer: Self-pay | Admitting: Nurse Practitioner

## 2019-06-04 ENCOUNTER — Other Ambulatory Visit: Payer: Managed Care, Other (non HMO)

## 2019-06-04 ENCOUNTER — Telehealth (INDEPENDENT_AMBULATORY_CARE_PROVIDER_SITE_OTHER): Payer: Managed Care, Other (non HMO) | Admitting: Nurse Practitioner

## 2019-06-04 ENCOUNTER — Other Ambulatory Visit: Payer: Self-pay

## 2019-06-04 VITALS — BP 147/83 | HR 83 | Temp 96.3°F | Ht 64.0 in | Wt 325.0 lb

## 2019-06-04 DIAGNOSIS — M5137 Other intervertebral disc degeneration, lumbosacral region: Secondary | ICD-10-CM | POA: Diagnosis not present

## 2019-06-04 DIAGNOSIS — E1169 Type 2 diabetes mellitus with other specified complication: Secondary | ICD-10-CM | POA: Diagnosis not present

## 2019-06-04 DIAGNOSIS — E1165 Type 2 diabetes mellitus with hyperglycemia: Secondary | ICD-10-CM

## 2019-06-04 DIAGNOSIS — M4807 Spinal stenosis, lumbosacral region: Secondary | ICD-10-CM | POA: Diagnosis not present

## 2019-06-04 DIAGNOSIS — M51379 Other intervertebral disc degeneration, lumbosacral region without mention of lumbar back pain or lower extremity pain: Secondary | ICD-10-CM

## 2019-06-04 DIAGNOSIS — R110 Nausea: Secondary | ICD-10-CM | POA: Diagnosis not present

## 2019-06-04 NOTE — Addendum Note (Signed)
Addended by: Lynnea Ferrier on: 06/04/2019 01:15 PM   Modules accepted: Orders

## 2019-06-04 NOTE — Patient Instructions (Addendum)
Continue ibuprofen as prescribed Alternate between warm and cold compress as needed FMLA form for chronic back pain will be completed. It will include the following dates: 05/24/2019, 06/03/2019 and 06/04/2019. You will have 2days per episode.  Improved HgbA1c to 7.8 from 11.2. Normal amylase and liver function, but mild elevation in lipase. This is due to ozempic injection. Decrease dose to 0.25mg  weekly. Will need to  repeat lipase and amylase in 4weeks.

## 2019-06-04 NOTE — Progress Notes (Addendum)
Virtual Visit via Video Note I connected with@ on 06/07/19 at 11:30 AM EDT by a video enabled telemedicine application and verified that I am speaking with the correct person using two identifiers.  Location: Patient:Home Provider: Office Participants: patient and provider  I discussed the limitations of evaluation and management by telemedicine and the availability of in person appointments. I also discussed with the patient that there may be a patient responsible charge related to this service. The patient expressed understanding and agreed to proceed.  [id number] pain--lower back spasm, cant move/took ibuprofen /going on 1 day/ Ozempic refill? [employer] [facility] [employer] on [address].   History of Present Illness: Chronic back pain secondary to DDD and spinal stenosis: Acute excerbation due to repetitive lifting at work. Onset yesterday. Improved with use of ibuprofen 800mg  Denies any new symptoms, no leg weakness. Needs FMLA form renewed.  DM: Reports persistent  Intermittent nausea, mild, not needed to take zofran, improves with food intake. Glucose readings <150 Current use of metformin and ozempic only S/p cholecystectomy Wt Readings from Last 3 Encounters:  06/04/19 (!) 325 lb (147.4 kg)  05/04/19 (!) 333 lb 6.4 oz (151.2 kg)  04/14/19 (!) 333 lb (151 kg)   Observations/Objective: Physical Exam  Constitutional: She is oriented to person, place, and time. No distress.  Pulmonary/Chest: Effort normal.  Neurological: She is alert and oriented to person, place, and time.  Vitals reviewed.  Assessment and Plan: Sarahanne was seen today for back pain.  Diagnoses and all orders for this visit:  DDD (degenerative disc disease), lumbosacral -     Amylase  Type 2 diabetes mellitus with other specified complication, without long-term current use of insulin (HCC) -     Cancel: Hemoglobin A1c -     Cancel: Hepatic function panel -     Hemoglobin A1c -     Hepatic function  panel -     Lipase -     Amylase; Future -     Lipase; Future  Spinal stenosis of lumbosacral region  Nausea -     Cancel: Amylase -     Cancel: Lipase -     Cancel: Hepatic function panel -     Hemoglobin A1c -     Hepatic function panel -     Lipase -     Amylase; Future -     Lipase; Future  Type 2 diabetes mellitus with hyperglycemia, without long-term current use of insulin (HCC)   Follow Up Instructions: Continue ibuprofen as prescribed Alternate between warm and cold compress as needed FMLA form for chronic back pain will be completed. It will include the following dates: 05/24/2019, 06/03/2019 and 06/04/2019. You will have 2days per episode. Go to lab for blood draw today.   I discussed the assessment and treatment plan with the patient. The patient was provided an opportunity to ask questions and all were answered. The patient agreed with the plan and demonstrated an understanding of the instructions.   The patient was advised to call back or seek an in-person evaluation if the symptoms worsen or if the condition fails to improve as anticipated.   [name], NP

## 2019-06-05 LAB — HEMOGLOBIN A1C
Est. average glucose Bld gHb Est-mCnc: 177 mg/dL
Hgb A1c MFr Bld: 7.8 % — ABNORMAL HIGH (ref 4.8–5.6)

## 2019-06-05 LAB — HEPATIC FUNCTION PANEL
ALT: 23 IU/L (ref 0–32)
AST: 23 IU/L (ref 0–40)
Albumin: 4 g/dL (ref 3.8–4.9)
Alkaline Phosphatase: 83 IU/L (ref 39–117)
Bilirubin Total: 0.3 mg/dL (ref 0.0–1.2)
Bilirubin, Direct: 0.12 mg/dL (ref 0.00–0.40)
Total Protein: 6.6 g/dL (ref 6.0–8.5)

## 2019-06-05 LAB — AMYLASE: Amylase: 49 U/L (ref 31–110)

## 2019-06-05 LAB — LIPASE: Lipase: 86 U/L — ABNORMAL HIGH (ref 14–72)

## 2019-06-07 NOTE — Addendum Note (Signed)
Addended by: Leana Gamer on: 06/07/2019 04:23 PM   Modules accepted: Orders

## 2019-06-08 ENCOUNTER — Encounter: Payer: Self-pay | Admitting: Nurse Practitioner

## 2019-06-08 ENCOUNTER — Other Ambulatory Visit (INDEPENDENT_AMBULATORY_CARE_PROVIDER_SITE_OTHER): Payer: Self-pay | Admitting: Bariatrics

## 2019-06-08 ENCOUNTER — Other Ambulatory Visit: Payer: Self-pay | Admitting: Nurse Practitioner

## 2019-06-08 DIAGNOSIS — E559 Vitamin D deficiency, unspecified: Secondary | ICD-10-CM

## 2019-06-09 ENCOUNTER — Telehealth: Payer: Self-pay | Admitting: Nurse Practitioner

## 2019-06-09 NOTE — Telephone Encounter (Signed)
LVM for the pt to call back, need to inform message below.  

## 2019-06-09 NOTE — Telephone Encounter (Signed)
PA approved but [facility] stated they are not [place] to fill this because insurance wants this med to go to mail order pharmacy as of today.   LVM for the pt to call back.

## 2019-06-09 NOTE — Telephone Encounter (Signed)
PA started for Ozempic, waiting for result  Ruth [name] (Key: [id number])

## 2019-06-09 NOTE — Telephone Encounter (Signed)
[place] please advise, noted in the last rx stating from Dr. Owens Shark?

## 2019-06-09 NOTE — Telephone Encounter (Signed)
Please discuss med refill with weight loss clinic.

## 2019-06-10 NOTE — Telephone Encounter (Signed)
Left another vm for the pt to call back.  

## 2019-06-10 NOTE — Telephone Encounter (Signed)
Pt called back and was informed of the PA and that insurance preferred mail order pharmacy.  Pt stated she would call back tomorrow she wanted to speak to [place].

## 2019-06-11 NOTE — Telephone Encounter (Signed)
LVM for the pt to call back.

## 2019-06-14 NOTE — Telephone Encounter (Signed)
Patient is returning the call. CB is [phone]

## 2019-06-15 ENCOUNTER — Encounter: Payer: Self-pay | Admitting: Nurse Practitioner

## 2019-06-21 ENCOUNTER — Encounter: Payer: Self-pay | Admitting: Nurse Practitioner

## 2019-06-22 ENCOUNTER — Encounter: Payer: Managed Care, Other (non HMO) | Admitting: Orthotics

## 2019-06-22 ENCOUNTER — Telehealth: Payer: Self-pay | Admitting: Nurse Practitioner

## 2019-06-22 DIAGNOSIS — I10 Essential (primary) hypertension: Secondary | ICD-10-CM

## 2019-06-22 DIAGNOSIS — E1165 Type 2 diabetes mellitus with hyperglycemia: Secondary | ICD-10-CM

## 2019-06-22 MED ORDER — ATENOLOL 50 MG PO TABS: 50.0000 mg | ORAL_TABLET | Freq: Every day | ORAL | 3 refills | Status: DC

## 2019-06-22 MED ORDER — GLIPIZIDE ER 2.5 MG PO TB24: 2.5000 mg | ORAL_TABLET | Freq: Every day | ORAL | 1 refills | Status: DC

## 2019-06-22 MED ORDER — OZEMPIC (0.25 OR 0.5 MG/DOSE) 2 MG/1.5ML ~~LOC~~ SOPN: 0.2500 mg | PEN_INJECTOR | SUBCUTANEOUS | 1 refills | Status: DC

## 2019-06-22 MED ORDER — METFORMIN HCL 850 MG PO TABS: 850.0000 mg | ORAL_TABLET | Freq: Two times a day (BID) | ORAL | 3 refills | Status: DC

## 2019-06-22 NOTE — Telephone Encounter (Signed)
Left a Voicemail and sent her a my chart message letting her know forms was sent electronically.

## 2019-06-22 NOTE — Telephone Encounter (Signed)
Pt was notified and told that we haven't received any paperwork from [place]. I gave her fax number again to see if they can fax them.

## 2019-06-22 NOTE — Telephone Encounter (Signed)
Sent electronically to [facility]

## 2019-06-22 NOTE — Telephone Encounter (Signed)
Patient is returning the call. CB is [phone]

## 2019-06-23 ENCOUNTER — Encounter: Payer: Self-pay | Admitting: Nurse Practitioner

## 2019-06-23 NOTE — Telephone Encounter (Signed)
Last FMLA form completed for back pain was 09/2018, which expired 03/2019. This form will cover only chronic back pain, same as previous form.

## 2019-06-23 NOTE — Telephone Encounter (Signed)
Left a voicemail for patient to call back but I also sent her a My Chart message letting her know doctor notes was given and FMLA wasn't needed for leave of absence.

## 2019-06-23 NOTE — Telephone Encounter (Signed)
She was provided work note for 05/24/19 (nausea) and 4/22-4/23/21 (back pain). These visits were not related to continuous leave of absence, so I will not be completing FMLA form for this.

## 2019-06-23 NOTE — Telephone Encounter (Signed)
Pt called back requesting to speak to [place], please advise

## 2019-06-23 NOTE — Telephone Encounter (Signed)
Found attachment on 06/04/19 in her My Chart messaged and printed forms and left on Charlottes desk.  Sent My Chart message back that I did find her forms and that I would give them to [place] to get them started.

## 2019-06-24 NOTE — Telephone Encounter (Signed)
Left a message for pt to let her know her FMLA forms are completed and left up front with her name. Pt is aware of the fee of the forms.

## 2019-06-25 ENCOUNTER — Other Ambulatory Visit (INDEPENDENT_AMBULATORY_CARE_PROVIDER_SITE_OTHER): Payer: Managed Care, Other (non HMO)

## 2019-06-25 ENCOUNTER — Other Ambulatory Visit: Payer: Managed Care, Other (non HMO)

## 2019-06-25 ENCOUNTER — Encounter: Payer: Managed Care, Other (non HMO) | Admitting: Orthotics

## 2019-06-25 ENCOUNTER — Other Ambulatory Visit: Payer: Self-pay

## 2019-06-25 ENCOUNTER — Encounter: Payer: Self-pay | Admitting: Podiatry

## 2019-06-25 DIAGNOSIS — E1169 Type 2 diabetes mellitus with other specified complication: Secondary | ICD-10-CM

## 2019-06-25 DIAGNOSIS — R110 Nausea: Secondary | ICD-10-CM

## 2019-06-26 LAB — AMYLASE: Amylase: 75 U/L (ref 31–110)

## 2019-06-26 LAB — LIPASE: Lipase: 21 U/L (ref 14–72)

## 2019-07-01 ENCOUNTER — Ambulatory Visit (INDEPENDENT_AMBULATORY_CARE_PROVIDER_SITE_OTHER): Payer: Managed Care, Other (non HMO) | Admitting: Bariatrics

## 2019-07-13 ENCOUNTER — Encounter: Payer: Self-pay | Admitting: Podiatry

## 2019-07-13 ENCOUNTER — Telehealth: Payer: Self-pay | Admitting: Nurse Practitioner

## 2019-07-13 NOTE — Telephone Encounter (Signed)
Please advise her to review her form again or review form with her (it has already been scanned in her chart). The information she provided is incorrect.  Her form was completed similar that 2020 form.

## 2019-07-13 NOTE — Telephone Encounter (Signed)
Pt called and requested to talk to [place], I let her know I would have her give her a call back as soon as she is [place].

## 2019-07-13 NOTE — Telephone Encounter (Signed)
Charlotte please advise.  Pt called in saying the paper work we filled out for [employer] for her intermittent FMLA didn't get her enough time. Pt states that it got her 1 hour every 3 months when before she got atleast 3 days every 3 months. Pt was wondering if we could refill these out or look into it.

## 2019-07-15 NOTE — Telephone Encounter (Signed)
Left message for pt to call to schedule an appt to see Liliane Channel to get the orthotics adjusted or if we need to send back we can at that time.

## 2019-07-15 NOTE — Telephone Encounter (Signed)
Sent pt a My Chart message to inform her while she is at work, pt usually calls office on her lunch break.

## 2019-07-16 NOTE — Telephone Encounter (Signed)
Pt was informed through My Chart message and Pt let me know via My Chart that she thinks that its [employer] not on our end if it was done the same. Pt thanked [place] for triyng to help.

## 2019-07-29 ENCOUNTER — Other Ambulatory Visit: Payer: Self-pay | Admitting: Nurse Practitioner

## 2019-08-04 ENCOUNTER — Ambulatory Visit: Payer: Managed Care, Other (non HMO) | Admitting: Nurse Practitioner

## 2019-08-04 ENCOUNTER — Encounter (INDEPENDENT_AMBULATORY_CARE_PROVIDER_SITE_OTHER): Payer: Self-pay | Admitting: Bariatrics

## 2019-08-04 ENCOUNTER — Other Ambulatory Visit: Payer: Self-pay

## 2019-08-04 ENCOUNTER — Ambulatory Visit (INDEPENDENT_AMBULATORY_CARE_PROVIDER_SITE_OTHER): Payer: Managed Care, Other (non HMO) | Admitting: Bariatrics

## 2019-08-04 VITALS — BP 150/81 | HR 74 | Temp 98.1°F | Ht 64.0 in | Wt 324.0 lb

## 2019-08-04 DIAGNOSIS — E119 Type 2 diabetes mellitus without complications: Secondary | ICD-10-CM | POA: Diagnosis not present

## 2019-08-04 DIAGNOSIS — Z6841 Body Mass Index (BMI) 40.0 and over, adult: Secondary | ICD-10-CM

## 2019-08-04 DIAGNOSIS — F3289 Other specified depressive episodes: Secondary | ICD-10-CM

## 2019-08-04 DIAGNOSIS — Z9189 Other specified personal risk factors, not elsewhere classified: Secondary | ICD-10-CM | POA: Diagnosis not present

## 2019-08-04 MED ORDER — BUPROPION HCL ER (SR) 150 MG PO TB12: 150.0000 mg | ORAL_TABLET | Freq: Every day | ORAL | 0 refills | Status: DC

## 2019-08-05 ENCOUNTER — Encounter (INDEPENDENT_AMBULATORY_CARE_PROVIDER_SITE_OTHER): Payer: Self-pay | Admitting: Bariatrics

## 2019-08-05 ENCOUNTER — Other Ambulatory Visit: Payer: Self-pay | Admitting: Nurse Practitioner

## 2019-08-05 DIAGNOSIS — E1165 Type 2 diabetes mellitus with hyperglycemia: Secondary | ICD-10-CM

## 2019-08-05 NOTE — Progress Notes (Signed)
Chief Complaint:   OBESITY Ruth [name] is here to discuss her progress with her obesity treatment plan along with follow-up of her obesity related diagnoses. Ruth [name] is on the Category 3 Plan and states she is following her eating plan approximately 50% of the time. Ruth [name] states she is exercising 0 minutes 0 times per week.  Today's visit was #: 11 Starting weight: 336 lbs Starting date: 09/07/2018 Today's weight: 324 lbs Today's date: 08/04/2019 Total lbs lost to date: 12 Total lbs lost since last in-office visit: 9  Interim History: Ruth [name] is down 9 lbs. She has been more adherent to the plan and eating fewer carbohydrates.  Subjective:   Other depression, with emotional eating. Ruth [name] is struggling with emotional eating and using food for comfort to the extent that it is negatively impacting her health. She has been working on behavior modification techniques to help reduce her emotional eating and has been somewhat successful. She shows no sign of suicidal or homicidal ideations. Ruth [name] is on Wellbutrin, which she states helps with her cravings.  Type 2 diabetes mellitus without complication, without long-term current use of insulin ([place]). Ruth [name] is taking glipizide, metformin, and Ozempic. Fasting blood sugars range between 140 and 160.  Lab Results  Component Value Date   HGBA1C 7.8 (H) 06/04/2019   HGBA1C 11.2 (H) 03/06/2019   HGBA1C 11.6 (H) 02/10/2019   Lab Results  Component Value Date   MICROALBUR 0.4 08/21/2018   LDLCALC 67 05/04/2019   CREATININE 0.58 04/06/2019   No results found for: INSULIN  At risk for hypoglycemia. Ruth [name] is at increased risk for hypoglycemia due to diabetes mellitus type II and dietary changes.   Assessment/Plan:   Other depression, with emotional eating. Behavior modification techniques were discussed today to help Ruth [name] deal with her emotional/non-hunger eating behaviors.  Orders and follow up as documented in patient record.  Refill was given for buPROPion (WELLBUTRIN SR) 150 MG 12 hr tablet 1 daily #30 with 0 refills.   Type 2 diabetes mellitus without complication, without long-term current use of insulin (Ruth [name]). Good blood sugar control is important to decrease the likelihood of diabetic complications such as nephropathy, neuropathy, limb loss, blindness, coronary artery disease, and death. Intensive lifestyle modification including diet, exercise and weight loss are the first line of treatment for diabetes. Ruth [name] will continue her medications as directed. If fasting blood sugars drop below 150, she will not take glipizide.  At risk for hypoglycemia. Ruth [name] was given approximately 15 minutes of counseling today regarding prevention of hypoglycemia. She was advised of symptoms of hypoglycemia. Ruth [name] was instructed to avoid skipping meals, eat regular protein rich meals and schedule low calorie snacks as needed.   Repetitive spaced learning was employed today to elicit superior memory formation and behavioral change.  Class 3 severe obesity with serious comorbidity and body mass index (BMI) of 50.0 to 59.9 in adult, unspecified obesity type (Ruth [name]).  Ruth [name] is currently in the action stage of change. As such, her goal is to continue with weight loss efforts. She has agreed to the Category 3 Plan.   She will work on meal planning, intentional eating, decreasing carb intake, and increasing her water intake.  Exercise goals: All adults should avoid inactivity. Some physical activity is better than none, and adults who participate in any amount of physical activity gain some health benefits.  Behavioral modification strategies: increasing lean protein intake, decreasing simple carbohydrates, increasing vegetables, increasing water intake, decreasing eating out, no skipping meals  and planning for success.  Ruth [name] has agreed to follow-up with our clinic in 2 weeks. She was informed of the importance of frequent follow-up  visits to maximize her success with intensive lifestyle modifications for her multiple health conditions.   Objective:   Blood pressure (!) 150/81, pulse 74, temperature 98.1 F (36.7 C), height 5\' 4"  (1.626 m), weight (!) 324 lb (147 kg), SpO2 99 %. Body mass index is 55.61 kg/m.  General: Cooperative, alert, well developed, in no acute distress. HEENT: Conjunctivae and lids unremarkable. Cardiovascular: Regular rhythm.  Lungs: Normal work of breathing. Neurologic: No focal deficits.   Lab Results  Component Value Date   CREATININE 0.58 04/06/2019   BUN 9 04/06/2019   NA 141 04/06/2019   K 4.6 04/06/2019   CL 103 04/06/2019   CO2 24 04/06/2019   Lab Results  Component Value Date   ALT 23 06/04/2019   AST 23 06/04/2019   ALKPHOS 83 06/04/2019   BILITOT 0.3 06/04/2019   Lab Results  Component Value Date   HGBA1C 7.8 (H) 06/04/2019   HGBA1C 11.2 (H) 03/06/2019   HGBA1C 11.6 (H) 02/10/2019   HGBA1C 8.3 (H) 08/21/2018   HGBA1C 6.2 (A) 02/03/2018   No results found for: INSULIN Lab Results  Component Value Date   TSH 1.760 09/07/2018   Lab Results  Component Value Date   CHOL 129 05/04/2019   HDL 48 05/04/2019   LDLCALC 67 05/04/2019   TRIG 68 05/04/2019   CHOLHDL 2.7 05/04/2019   Lab Results  Component Value Date   WBC 5.7 03/09/2019   HGB 12.6 03/09/2019   HCT 39.3 03/09/2019   MCV 90.3 03/09/2019   PLT 343 03/09/2019   Lab Results  Component Value Date   FERRITIN 837 (H) 03/08/2019   Attestation Statements:   Reviewed by clinician on day of visit: allergies, medications, problem list, medical history, surgical history, family history, social history, and previous encounter notes.  Migdalia Dk, am acting as Location manager for [employer], DO   I have reviewed the above documentation for accuracy and completeness, and I agree with the above. Jearld Lesch, DO

## 2019-08-25 ENCOUNTER — Other Ambulatory Visit: Payer: Self-pay

## 2019-08-25 ENCOUNTER — Encounter (INDEPENDENT_AMBULATORY_CARE_PROVIDER_SITE_OTHER): Payer: Self-pay | Admitting: Adult Health

## 2019-08-25 ENCOUNTER — Ambulatory Visit (INDEPENDENT_AMBULATORY_CARE_PROVIDER_SITE_OTHER): Payer: Managed Care, Other (non HMO) | Admitting: Adult Health

## 2019-08-25 VITALS — BP 129/78 | HR 78 | Temp 97.8°F | Ht 64.0 in | Wt 324.0 lb

## 2019-08-25 DIAGNOSIS — E118 Type 2 diabetes mellitus with unspecified complications: Secondary | ICD-10-CM | POA: Diagnosis not present

## 2019-08-25 DIAGNOSIS — Z6841 Body Mass Index (BMI) 40.0 and over, adult: Secondary | ICD-10-CM

## 2019-08-25 DIAGNOSIS — F3289 Other specified depressive episodes: Secondary | ICD-10-CM

## 2019-08-25 DIAGNOSIS — F32A Depression, unspecified: Secondary | ICD-10-CM | POA: Insufficient documentation

## 2019-08-25 DIAGNOSIS — Z9189 Other specified personal risk factors, not elsewhere classified: Secondary | ICD-10-CM | POA: Diagnosis not present

## 2019-08-25 DIAGNOSIS — E559 Vitamin D deficiency, unspecified: Secondary | ICD-10-CM

## 2019-08-25 MED ORDER — BUPROPION HCL ER (SR) 150 MG PO TB12: 150.0000 mg | ORAL_TABLET | Freq: Every day | ORAL | 0 refills | Status: DC

## 2019-08-25 MED ORDER — VITAMIN D (ERGOCALCIFEROL) 1.25 MG (50000 UNIT) PO CAPS: 50000.0000 [IU] | ORAL_CAPSULE | ORAL | 0 refills | Status: DC

## 2019-08-25 NOTE — Progress Notes (Signed)
Chief Complaint:   OBESITY Ruth [name] is here to discuss her progress with her obesity treatment plan along with follow-up of her obesity related diagnoses. Ruth [name] is on the Category 3 Plan and states she is following her eating plan approximately 50% of the time. Ruth [name] states she is exercising 0 minutes 0 times per week.  Today's visit was #: 12 Starting weight: 336 lbs Starting date: 09/07/2018 Today's weight: 324 lbs Today's date: 08/25/2019 Total lbs lost to date: 12 Total lbs lost since last in-office visit: 0  Interim History: Ruth [name] celebrated Independence Day at her daughter's home and focused on protein and minimal carbohydrate intake - great! She continues to enjoy the Category 3 meal plan and with the assistance of metformin and bupropion denies excessive hunger or cravings.  Subjective:   Vitamin D deficiency. Ruth [name] is on prescription strength Vitamin D supplementation and denies nausea, vomiting, or muscle weakness.    Ref. Range 09/07/2018 12:22  Vitamin D, 25-Hydroxy Latest Ref Range: 30.0 - 100.0 ng/mL 24.9 (L)   Other depression, with emotional eating. Ruth [name] is struggling with emotional eating and using food for comfort to the extent that it is negatively impacting her health. She has been working on behavior modification techniques to help reduce her emotional eating and has been somewhat successful. She shows no sign of suicidal or homicidal ideations. Blood pressure is stable at today's office visit.  Type 2 diabetes with complication (Ruth [name]). Ruth [name] is on metformin, Lantus, and Ozempic and tolerating them well. Ambulatory fasting blood glucose level <140s. She denies episodes of hypoglycemia.   Lab Results  Component Value Date   HGBA1C 7.8 (H) 06/04/2019   HGBA1C 11.2 (H) 03/06/2019   HGBA1C 11.6 (H) 02/10/2019   Lab Results  Component Value Date   MICROALBUR 0.4 08/21/2018   LDLCALC 67 05/04/2019   CREATININE 0.58 04/06/2019   No results found  for: INSULIN  At risk for heart disease. Ruth [name] is at a higher than average risk for cardiovascular disease due to hypertension, type II diabetes mellitus, and obesity.   Assessment/Plan:   Vitamin D deficiency. Low Vitamin D level contributes to fatigue and are associated with obesity, breast, and colon cancer. She was given a refill on her Vitamin D, Ergocalciferol, (DRISDOL) 1.25 MG (50000 UNIT) CAPS capsule every week #12 with 0 refills and will follow-up for routine testing of Vitamin D at her next office visit.   Other depression, with emotional eating. Behavior modification techniques were discussed today to help Ruth [name] deal with her emotional/non-hunger eating behaviors.  Orders and follow up as documented in patient record. Refill was given for buPROPion (WELLBUTRIN SR) 150 MG 12 hr tablet daily #90 with 0 refills.  Type 2 diabetes with complication (Ruth [name]). Good blood sugar control is important to decrease the likelihood of diabetic complications such as nephropathy, neuropathy, limb loss, blindness, coronary artery disease, and death. Intensive lifestyle modification including diet, exercise and weight loss are the first line of treatment for diabetes. Ruth [name] will continue her current anti-diabetic medication and will have labs checked at her next office visit.  At risk for heart disease. Ruth [name] was given approximately 15 minutes of coronary artery disease prevention counseling today. She is 59 y.o. female and has risk factors for heart disease including obesity. We discussed intensive lifestyle modifications today with an emphasis on specific weight loss instructions and strategies.   Repetitive spaced learning was employed today to elicit superior memory formation and behavioral change.  Class 3  severe obesity with serious comorbidity and body mass index (BMI) of 50.0 to 59.9 in adult, unspecified obesity type ([place]).  Ruth [name] is currently in the action stage of change. As such, her goal  is to continue with weight loss efforts. She has agreed to the Category 3 Plan. She was encouraged to use the food scale to weigh protein servings at lunch and dinner.  Handout was provided on Protein Equivalent Snacks.  Exercise goals: No exercise has been prescribed at this time.  Behavioral modification strategies: increasing lean protein intake, decreasing simple carbohydrates, increasing water intake, meal planning and cooking strategies and holiday eating strategies .  Ruth [name] has agreed to follow-up with our clinic in 2-3 weeks. She was informed of the importance of frequent follow-up visits to maximize her success with intensive lifestyle modifications for her multiple health conditions.   Objective:   Blood pressure 129/78, pulse 78, temperature 97.8 F (36.6 C), temperature source Oral, height 5\' 4"  (1.626 m), weight (!) 324 lb (147 kg), SpO2 100 %. Body mass index is 55.61 kg/m.  General: Cooperative, alert, well developed, in no acute distress. HEENT: Conjunctivae and lids unremarkable. Cardiovascular: Regular rhythm.  Lungs: Normal work of breathing. Neurologic: No focal deficits.   Lab Results  Component Value Date   CREATININE 0.58 04/06/2019   BUN 9 04/06/2019   NA 141 04/06/2019   K 4.6 04/06/2019   CL 103 04/06/2019   CO2 24 04/06/2019   Lab Results  Component Value Date   ALT 23 06/04/2019   AST 23 06/04/2019   ALKPHOS 83 06/04/2019   BILITOT 0.3 06/04/2019   Lab Results  Component Value Date   HGBA1C 7.8 (H) 06/04/2019   HGBA1C 11.2 (H) 03/06/2019   HGBA1C 11.6 (H) 02/10/2019   HGBA1C 8.3 (H) 08/21/2018   HGBA1C 6.2 (A) 02/03/2018   No results found for: INSULIN Lab Results  Component Value Date   TSH 1.760 09/07/2018   Lab Results  Component Value Date   CHOL 129 05/04/2019   HDL 48 05/04/2019   LDLCALC 67 05/04/2019   TRIG 68 05/04/2019   CHOLHDL 2.7 05/04/2019   Lab Results  Component Value Date   WBC 5.7 03/09/2019   HGB 12.6  03/09/2019   HCT 39.3 03/09/2019   MCV 90.3 03/09/2019   PLT 343 03/09/2019   Lab Results  Component Value Date   FERRITIN 837 (H) 03/08/2019   Attestation Statements:   Reviewed by clinician on day of visit: allergies, medications, problem list, medical history, surgical history, family history, social history, and previous encounter notes.  I, Michaelene Song, am acting as Location manager for [employer], NP-C   I have reviewed the above documentation for accuracy and completeness, and I agree with the above. -  Esaw Grandchild, NP

## 2019-08-31 ENCOUNTER — Other Ambulatory Visit (INDEPENDENT_AMBULATORY_CARE_PROVIDER_SITE_OTHER): Payer: Self-pay | Admitting: Bariatrics

## 2019-08-31 DIAGNOSIS — F3289 Other specified depressive episodes: Secondary | ICD-10-CM

## 2019-09-16 ENCOUNTER — Ambulatory Visit (INDEPENDENT_AMBULATORY_CARE_PROVIDER_SITE_OTHER): Payer: Managed Care, Other (non HMO) | Admitting: Bariatrics

## 2019-09-23 ENCOUNTER — Other Ambulatory Visit: Payer: Self-pay

## 2019-09-24 ENCOUNTER — Ambulatory Visit (INDEPENDENT_AMBULATORY_CARE_PROVIDER_SITE_OTHER): Payer: Managed Care, Other (non HMO) | Admitting: Nurse Practitioner

## 2019-09-24 ENCOUNTER — Encounter: Payer: Self-pay | Admitting: Nurse Practitioner

## 2019-09-24 VITALS — BP 132/88 | HR 84 | Temp 97.0°F | Ht 64.0 in | Wt 326.8 lb

## 2019-09-24 DIAGNOSIS — E1169 Type 2 diabetes mellitus with other specified complication: Secondary | ICD-10-CM | POA: Diagnosis not present

## 2019-09-24 DIAGNOSIS — I10 Essential (primary) hypertension: Secondary | ICD-10-CM | POA: Diagnosis not present

## 2019-09-24 MED ORDER — OZEMPIC (1 MG/DOSE) 2 MG/1.5ML ~~LOC~~ SOPN: 1.0000 mg | PEN_INJECTOR | SUBCUTANEOUS | 5 refills | Status: DC

## 2019-09-24 NOTE — Assessment & Plan Note (Signed)
Average fasting glucose in last 90days: 167 Denies any ABD pain, or nausea or constipation with ozempic Last hgbA1c of 7.8 Ongoing appts with nutritionist Will schedule appt with ophthalmologist.  Repeat hgbA1c and urine microalbumin Increase ozempic to 1mg  weekly F/up in 3months

## 2019-09-24 NOTE — Assessment & Plan Note (Signed)
BP at goal with amlodipine BP Readings from Last 3 Encounters:  09/24/19 132/88  08/25/19 129/78  08/04/19 (!) 150/81   Repeat BMP Maintain current medication

## 2019-09-24 NOTE — Patient Instructions (Addendum)
Go to lab for blood draw and urine collection  Your last CPE was 05/04/2019  Resume metformin BID. Increase ozempic to 1mg  weekly Continue other medications as presecribed.

## 2019-09-24 NOTE — Progress Notes (Signed)
Subjective:  Patient ID: Ruth [name], female    DOB: June 02, 1960  Age: 59 y.o. MRN: [id number]  CC: Follow-up (HTN, DM and hyperlipidemia)   HPI  HTN (hypertension), benign BP at goal with amlodipine BP Readings from Last 3 Encounters:  09/24/19 132/88  08/25/19 129/78  08/04/19 (!) 150/81   Repeat BMP Maintain current medication  DM (diabetes mellitus) (HCC) Average fasting glucose in last 90days: 167 Denies any ABD pain, or nausea or constipation with ozempic Last hgbA1c of 7.8 Ongoing appts with nutritionist Will schedule appt with ophthalmologist.  Repeat hgbA1c and urine microalbumin Increase ozempic to 1mg  weekly F/up in 13months   Wt Readings from Last 3 Encounters:  09/24/19 (!) 326 lb 12.8 oz (148.2 kg)  08/25/19 (!) 324 lb (147 kg)  08/04/19 (!) 324 lb (147 kg)   BP Readings from Last 3 Encounters:  09/24/19 132/88  08/25/19 129/78  08/04/19 (!) 150/81    Reviewed past Medical, Social and Family history today.  Outpatient Medications Prior to Visit  Medication Sig Dispense Refill  . Accu-Chek Softclix Lancets lancets USE AS DIRECTED TO TEST BLOOD SUGAR TWICE DAILY(BEFORE BREAKFAST AND AT BEDTIME) 100 each 11  . amLODipine (NORVASC) 5 MG tablet Take 1 tablet (5 mg total) by mouth at bedtime. (Patient taking differently: Take 5 mg by mouth daily. ) 90 tablet 1  . Ascorbic Acid (VITAMIN C PO) Take 1 tablet by mouth daily.    Marland Kitchen atenolol (TENORMIN) 50 MG tablet Take 1 tablet (50 mg total) by mouth daily. 90 tablet 3  . atorvastatin (LIPITOR) 40 MG tablet Take 1 tablet (40 mg total) by mouth daily at 6 PM. 90 tablet 3  . buPROPion (WELLBUTRIN SR) 150 MG 12 hr tablet Take 1 tablet (150 mg total) by mouth daily. 90 tablet 0  . glipiZIDE (GLUCOTROL XL) 2.5 MG 24 hr tablet Take 1 tablet (2.5 mg total) by mouth daily with breakfast. With heaviest meals 90 tablet 1  . glucose blood test strip Check blood glucose before breakfast and at bedtime. Accu Chek test strips  100 each 12  . ibuprofen (ADVIL) 800 MG tablet Take 800 mg by mouth every 8 (eight) hours as needed (pain).    . Insulin Pen Needle (NOVOFINE) 30G X 8 MM MISC Inject 10 each into the skin as needed. 1 packet 2  . metFORMIN (GLUCOPHAGE) 850 MG tablet Take 1 tablet (850 mg total) by mouth 2 (two) times daily with a meal. (Patient taking differently: Take 850 mg by mouth daily. ) 180 tablet 3  . Multiple Vitamins-Minerals (MULTIVITAMIN WITH MINERALS) tablet Take 1 tablet by mouth daily.    . ondansetron (ZOFRAN) 4 MG tablet Take 1 tablet (4 mg total) by mouth every 8 (eight) hours as needed for nausea or vomiting. 20 tablet 0  . traMADol (ULTRAM) 50 MG tablet Take 50 mg by mouth every 6 (six) hours as needed (pain).     . Vitamin D, Ergocalciferol, (DRISDOL) 1.25 MG (50000 UNIT) CAPS capsule Take 1 capsule (50,000 Units total) by mouth every 7 (seven) days. 12 capsule 0  . Semaglutide,0.25 or 0.5MG /DOS, (OZEMPIC, 0.25 OR 0.5 MG/DOSE,) 2 MG/1.5ML SOPN 0.375 mLs (0.5 mg total) by Other route once a week. Needs office visit for additional refills 3 mL 0   No facility-administered medications prior to visit.   ROS See HPI  Objective:  BP 132/88 (BP Location: Right Arm, Patient Position: Sitting, Cuff Size: Large)   Pulse 84   Temp Marland Kitchen)  97 F (36.1 C) (Temporal)   Ht 5\' 4"  (1.626 m)   Wt (!) 326 lb 12.8 oz (148.2 kg)   SpO2 98%   BMI 56.10 kg/m   Physical Exam Vitals reviewed.  Constitutional:      Appearance: She is obese.  Cardiovascular:     Rate and Rhythm: Normal rate and regular rhythm.     Pulses: Normal pulses.     Heart sounds: Normal heart sounds.  Pulmonary:     Effort: Pulmonary effort is normal.  Abdominal:     Palpations: Abdomen is soft.     Tenderness: There is no abdominal tenderness. There is no guarding.  Musculoskeletal:     Right lower leg: No edema.     Left lower leg: No edema.  Neurological:     Mental Status: She is alert and oriented to person, place, and  time.    Assessment & Plan:  This visit occurred during the SARS-CoV-2 public health emergency.  Safety protocols were in place, including screening questions prior to the visit, additional usage of staff PPE, and extensive cleaning of exam room while observing appropriate contact time as indicated for disinfecting solutions.   Chevy was seen today for follow-up.  Diagnoses and all orders for this visit:  HTN (hypertension), benign -     Basic metabolic panel  Type 2 diabetes mellitus with other specified complication, without long-term current use of insulin (HCC) -     Hemoglobin A1c -     Microalbumin / creatinine urine ratio -     Semaglutide, 1 MG/DOSE, (OZEMPIC, 1 MG/DOSE,) 2 MG/1.5ML SOPN; Inject 0.75 mLs (1 mg total) into the skin once a week.    Problem List Items Addressed This Visit      Cardiovascular and Mediastinum   HTN (hypertension), benign - Primary    BP at goal with amlodipine BP Readings from Last 3 Encounters:  09/24/19 132/88  08/25/19 129/78  08/04/19 (!) 150/81   Repeat BMP Maintain current medication      Relevant Orders   Basic metabolic panel     Endocrine   DM (diabetes mellitus) ([place])    Average fasting glucose in last 90days: 167 Denies any ABD pain, or nausea or constipation with ozempic Last hgbA1c of 7.8 Ongoing appts with nutritionist Will schedule appt with ophthalmologist.  Repeat hgbA1c and urine microalbumin Increase ozempic to 1mg  weekly F/up in 29months       Relevant Medications   Semaglutide, 1 MG/DOSE, (OZEMPIC, 1 MG/DOSE,) 2 MG/1.5ML SOPN   Other Relevant Orders   Hemoglobin A1c   Microalbumin / creatinine urine ratio      Follow-up: Return in about 3 months (around 12/25/2019) for DM (video, 69mins).  [name], NP

## 2019-09-25 LAB — BASIC METABOLIC PANEL
BUN/Creatinine Ratio: 21 (ref 9–23)
BUN: 13 mg/dL (ref 6–24)
CO2: 25 mmol/L (ref 20–29)
Calcium: 9.9 mg/dL (ref 8.7–10.2)
Chloride: 104 mmol/L (ref 96–106)
Creatinine, Ser: 0.62 mg/dL (ref 0.57–1.00)
GFR calc Af Amer: 115 mL/min/{1.73_m2} (ref >59)
GFR calc non Af Amer: 100 mL/min/{1.73_m2} (ref >59)
Glucose: 89 mg/dL (ref 65–99)
Potassium: 4.2 mmol/L (ref 3.5–5.2)
Sodium: 141 mmol/L (ref 134–144)

## 2019-09-25 LAB — HEMOGLOBIN A1C
Est. average glucose Bld gHb Est-mCnc: 154 mg/dL
Hgb A1c MFr Bld: 7 % — ABNORMAL HIGH (ref 4.8–5.6)

## 2019-09-25 LAB — MICROALBUMIN / CREATININE URINE RATIO
Creatinine, Urine: 46.8 mg/dL
Microalb/Creat Ratio: 9 mg/g creat (ref 0–29)
Microalbumin, Urine: 4.3 ug/mL

## 2019-09-29 ENCOUNTER — Encounter: Payer: Self-pay | Admitting: Nurse Practitioner

## 2019-09-30 ENCOUNTER — Ambulatory Visit (INDEPENDENT_AMBULATORY_CARE_PROVIDER_SITE_OTHER): Payer: Managed Care, Other (non HMO) | Admitting: Family Medicine

## 2019-10-06 ENCOUNTER — Encounter: Payer: Self-pay | Admitting: Nurse Practitioner

## 2019-10-08 NOTE — Telephone Encounter (Signed)
Patient is calling and wanted to speak to someone regarding paperwork, please advise. CB is [phone]

## 2019-11-18 ENCOUNTER — Telehealth: Payer: Self-pay | Admitting: Nurse Practitioner

## 2019-11-18 NOTE — Telephone Encounter (Signed)
Patient is calling and wanted to speak to someone regarding paperwork, please advise. CB is [phone]

## 2019-11-18 NOTE — Telephone Encounter (Signed)
Reviewed forms over the phone with patient and clarified her intermitted leave paperwork is on file and documented correctly.

## 2019-11-26 ENCOUNTER — Other Ambulatory Visit (INDEPENDENT_AMBULATORY_CARE_PROVIDER_SITE_OTHER): Payer: Self-pay | Admitting: Adult Health

## 2019-11-26 DIAGNOSIS — F3289 Other specified depressive episodes: Secondary | ICD-10-CM

## 2019-12-02 ENCOUNTER — Other Ambulatory Visit: Payer: Self-pay | Admitting: Nurse Practitioner

## 2019-12-23 ENCOUNTER — Encounter: Payer: Self-pay | Admitting: Nurse Practitioner

## 2019-12-23 ENCOUNTER — Other Ambulatory Visit: Payer: Self-pay

## 2019-12-24 ENCOUNTER — Ambulatory Visit: Payer: Managed Care, Other (non HMO) | Admitting: Nurse Practitioner

## 2019-12-24 ENCOUNTER — Encounter: Payer: Self-pay | Admitting: Family

## 2019-12-24 ENCOUNTER — Ambulatory Visit: Payer: Managed Care, Other (non HMO) | Admitting: Family

## 2019-12-24 VITALS — BP 134/78 | HR 85 | Temp 96.2°F | Ht 64.0 in | Wt 321.0 lb

## 2019-12-24 DIAGNOSIS — B009 Herpesviral infection, unspecified: Secondary | ICD-10-CM

## 2019-12-24 MED ORDER — VALACYCLOVIR HCL 1 G PO TABS
1000.0000 mg | ORAL_TABLET | Freq: Three times a day (TID) | ORAL | 0 refills | Status: DC
Start: 2019-12-24 — End: 2020-05-30

## 2019-12-24 NOTE — Patient Instructions (Signed)
Cold Sore  A cold sore, also called a fever blister, is a small, fluid-filled sore that forms inside the mouth or on the lips, gums, nose, chin, or cheeks. Cold sores can spread to other parts of the body, such as the eyes or fingers. In some people who have other medical conditions, cold sores can spread to multiple other body sites, including the genitals. Cold sores can spread from person to person (are contagious) until the sores crust over completely. Most cold sores go away within 2 weeks. What are the causes? Cold sores are caused by an infection from a common type of herpes simplex virus (HSV-1). HSV-1 is closely related to the HSV-2virus, which is the virus that causes genital herpes, but these viruses are not the same. Once a person is infected with HSV-1, the virus remains permanently in the body. HSV-1 is spread from person to person through close contact, such as through kissing, touching the affected area, or sharing personal items such as lip balm, razors, a drinking glass, or eating utensils. What increases the risk? You are more likely to develop this condition if you:  Are tired, stressed, or sick.  Are menstruating.  Are pregnant.  Take certain medicines.  Are exposed to cold weather or too much sun. What are the signs or symptoms? Symptoms of a cold sore outbreak go through different stages. These are the stages of a cold sore:  Tingling, itching, or burning is felt 1-2 days before the outbreak.  Fluid-filled blisters appear on the lips, inside the mouth, on the nose, or on the cheeks.  The blisters start to ooze clear fluid.  The blisters dry up, and a yellow crust appears in their place.  The crust falls off. In some cases, other symptoms can develop during a cold sore outbreak. These can include:  Fever.  Sore throat.  Headache.  Muscle aches.  Swollen neck glands. How is this diagnosed? This condition is diagnosed based on your medical history and a  physical exam. Your health care provider may do a blood test or may swab some fluid from your sore and then examine the swab in the lab. How is this treated? There is no cure for cold sores or HSV-1. There is also no vaccine for HSV-1. Most cold sores go away on their own without treatment within 2 weeks. Medicines cannot make the infection go away, but your health care provider may prescribe medicines to:  Help relieve some of the pain associated with the sores.  Work to stop the virus from multiplying.  Shorten healing time. Medicines may be in the form of creams, gels, pills, or a shot. Follow these instructions at home: Medicines  Take or apply over-the-counter and prescription medicines only as told by your health care provider.  Use a cotton-tip swab to apply creams or gels to your sores.  Ask your health care provider if you can take lysine supplements. Research has found that lysine may help heal the cold sore faster and prevent outbreaks. Sore care   Do not touch the sores or pick the scabs.  Wash your hands often. Do not touch your eyes without washing your hands first.  Keep the sores clean and dry.  If directed, apply ice to the sores: ? Put ice in a plastic bag. ? Place a towel between your skin and the bag. ? Leave the ice on for 20 minutes, 2-3 times a day. Eating and drinking  Eat a soft, bland diet. Avoid eating  hot, cold, or salty foods.  Use a straw if it hurts to drink out of a glass.  Eat foods that are rich in lysine, such as meat, fish, and dairy products.  Avoid sugary foods, chocolates, nuts, and grains. These foods are rich in a nutrient called arginine, which can cause the virus to multiply. Lifestyle  Do not kiss, have oral sex, or share personal items until your sores heal.  Stress, poor sleep, and being out in the sun can trigger outbreaks. Make sure you: ? Do activities that help you relax, such as deep breathing exercises or  meditation. ? Get enough sleep. ? Apply sunscreen on your lips before you go out in the sun. Contact a health care provider if:  You have symptoms for more than 2 weeks.  You have pus coming from the sores.  You have redness that is spreading.  You have pain or irritation in your eye.  You get sores on your genitals.  Your sores do not heal within 2 weeks.  You have frequent cold sore outbreaks. Get help right away if you have:  A fever and your symptoms suddenly get worse.  A headache and confusion.  Fatigue or loss of appetite.  A stiff neck or sensitivity to light. Summary  A cold sore, also called a fever blister, is a small, fluid-filled sore that forms inside the mouth or on the lips, gums, nose, chin, or cheeks.  Most cold sores go away on their own without treatment within 2 weeks. Your health care provider may prescribe medicines to help relieve some of the pain, work to stop the virus from multiplying, and shorten healing time.  Wash your hands often. Do not touch your eyes without washing your hands first.  Do not kiss, have oral sex, or share personal items until your sores heal.  Contact a health care provider if your sores do not heal within 2 weeks. This information is not intended to replace advice given to you by your health care provider. Make sure you discuss any questions you have with your health care provider. Document Revised: 05/20/2018 Document Reviewed: 06/30/2017 Elsevier Patient Education  [place].

## 2019-12-24 NOTE — Progress Notes (Signed)
Acute Office Visit  Subjective:    Patient ID: Ruth [name], female    DOB: [birth year], 59 y.o.   MRN: [id number]  Chief Complaint  Patient presents with  . Acute Visit    white blisters in mouth noticed 4 days ago.     HPI Patient is in today with c/o lesions on her tongue x 4 days that are burning and tingling. She reports having swelling to her tongue and swollen lymph nodes in her neck.. No sneezing, coughing or congestion. No oral sex. Never had symptoms like this before. Pain 4/10.    Past Medical History:  Diagnosis Date  . Anemia   . Arthritis   . Back pain   . Bilateral calcaneal spurs   . Diabetes mellitus without complication ([place])    type 2  . GERD (gastroesophageal reflux disease)   . Gout   . Hyperlipidemia   . Hypertension   . Obesity   . Osteoarthritis   . Palpitations   . Swallowing difficulty   . Vitamin D deficiency     Past Surgical History:  Procedure Laterality Date  . ABDOMINAL HYSTERECTOMY     partial  . CHOLECYSTECTOMY    . COLONOSCOPY WITH PROPOFOL N/A 10/29/2016   Procedure: COLONOSCOPY WITH PROPOFOL;  Surgeon: Yetta Flock, MD;  Location: [facility];  Service: Gastroenterology;  Laterality: N/A;  . DENTAL SURGERY    . ORIF TIBIA & FIBULA FRACTURES Left     Family History  Problem Relation Age of Onset  . Diabetes Mother   . Stroke Mother   . Dementia Mother   . Hypertension Mother   . Eating disorder Mother   . Obesity Mother   . Pancreatic cancer Father   . Sudden death Father   . Alcoholism Father   . Colon polyps Sister   . Kidney disease Sister   . Cancer Maternal Grandmother        type unknown    Social History   Socioeconomic History  . Marital status: Married    Spouse name: Nadara Mustard  . Number of children: 3  . Years of education: Not on file  . Highest education level: Not on file  Occupational History  . Occupation: Reference Clerk  Tobacco Use  . Smoking status: Former Smoker    Packs/day: 1.00      Years: 20.00    Pack years: 20.00    Quit date: 02/12/2008    Years since quitting: 11.8  . Smokeless tobacco: Never Used  Vaping Use  . Vaping Use: Never used  Substance and Sexual Activity  . Alcohol use: No  . Drug use: No  . Sexual activity: Yes    Birth control/protection: Surgical, Post-menopausal  Other Topics Concern  . Not on file  Social History Narrative  . Not on file   Social Determinants of Health   Financial Resource Strain:   . Difficulty of Paying Living Expenses: Not on file  Food Insecurity:   . Worried About Charity fundraiser in the Last Year: Not on file  . Ran Out of Food in the Last Year: Not on file  Transportation Needs:   . Lack of Transportation (Medical): Not on file  . Lack of Transportation (Non-Medical): Not on file  Physical Activity:   . Days of Exercise per Week: Not on file  . Minutes of Exercise per Session: Not on file  Stress:   . Feeling of Stress : Not on file  Social  Connections:   . Frequency of Communication with Friends and Family: Not on file  . Frequency of Social Gatherings with Friends and Family: Not on file  . Attends Religious Services: Not on file  . Active Member of Clubs or Organizations: Not on file  . Attends Archivist Meetings: Not on file  . Marital Status: Not on file  Intimate Partner Violence:   . Fear of Current or Ex-Partner: Not on file  . Emotionally Abused: Not on file  . Physically Abused: Not on file  . Sexually Abused: Not on file    Outpatient Medications Prior to Visit  Medication Sig Dispense Refill  . Accu-Chek Softclix Lancets lancets USE AS DIRECTED TO TEST BLOOD SUGAR TWICE DAILY(BEFORE BREAKFAST AND AT BEDTIME) 100 each 11  . amLODipine (NORVASC) 5 MG tablet Take 1 tablet (5 mg total) by mouth at bedtime. (Patient taking differently: Take 5 mg by mouth daily. ) 90 tablet 1  . atenolol (TENORMIN) 50 MG tablet Take 1 tablet (50 mg total) by mouth daily. 90 tablet 3  .  atorvastatin (LIPITOR) 40 MG tablet Take 1 tablet (40 mg total) by mouth daily at 6 PM. 90 tablet 3  . Blood Glucose Monitoring Suppl (ACCU-CHEK GUIDE ME) w/Device KIT CHECK GLUCOSE AT BREAKFAST AND BEDTIME 1 kit 0  . glucose blood test strip Check blood glucose before breakfast and at bedtime. Accu Chek test strips 100 each 12  . ibuprofen (ADVIL) 800 MG tablet Take 800 mg by mouth every 8 (eight) hours as needed (pain).    . Insulin Pen Needle (NOVOFINE) 30G X 8 MM MISC Inject 10 each into the skin as needed. 1 packet 2  . metFORMIN (GLUCOPHAGE) 850 MG tablet Take 1 tablet (850 mg total) by mouth 2 (two) times daily with a meal. (Patient taking differently: Take 850 mg by mouth daily. ) 180 tablet 3  . Multiple Vitamins-Minerals (MULTIVITAMIN WITH MINERALS) tablet Take 1 tablet by mouth daily.    . Semaglutide, 1 MG/DOSE, (OZEMPIC, 1 MG/DOSE,) 2 MG/1.5ML SOPN Inject 0.75 mLs (1 mg total) into the skin once a week. 3 mL 5  . Vitamin D, Ergocalciferol, (DRISDOL) 1.25 MG (50000 UNIT) CAPS capsule Take 1 capsule (50,000 Units total) by mouth every 7 (seven) days. 12 capsule 0  . Ascorbic Acid (VITAMIN C PO) Take 1 tablet by mouth daily. (Patient not taking: Reported on 12/24/2019)    . buPROPion (WELLBUTRIN SR) 150 MG 12 hr tablet Take 1 tablet (150 mg total) by mouth daily. (Patient not taking: Reported on 12/24/2019) 90 tablet 0  . glipiZIDE (GLUCOTROL XL) 2.5 MG 24 hr tablet Take 1 tablet (2.5 mg total) by mouth daily with breakfast. With heaviest meals (Patient not taking: Reported on 12/24/2019) 90 tablet 1  . ondansetron (ZOFRAN) 4 MG tablet Take 1 tablet (4 mg total) by mouth every 8 (eight) hours as needed for nausea or vomiting. (Patient not taking: Reported on 12/24/2019) 20 tablet 0  . traMADol (ULTRAM) 50 MG tablet Take 50 mg by mouth every 6 (six) hours as needed (pain).  (Patient not taking: Reported on 12/24/2019)     No facility-administered medications prior to visit.    Allergies    Allergen Reactions  . Lisinopril Swelling    Throat swelling  . Tape Other (See Comments)    Certain Band aids cause skin irritation    Review of Systems  HENT: Positive for mouth sores.   Respiratory: Negative.   Cardiovascular: Negative.  Gastrointestinal: Negative.   Musculoskeletal: Negative.   Skin: Negative.   Allergic/Immunologic: Negative.   Psychiatric/Behavioral: Negative.   All other systems reviewed and are negative.      Objective:    Physical Exam Constitutional:      Appearance: Normal appearance. She is normal weight.  HENT:     Mouth/Throat:     Mouth: Mucous membranes are moist.   Cardiovascular:     Rate and Rhythm: Normal rate and regular rhythm.     Pulses: Normal pulses.     Heart sounds: Normal heart sounds.  Pulmonary:     Effort: Pulmonary effort is normal.     Breath sounds: Normal breath sounds.  Musculoskeletal:     Cervical back: Normal range of motion.  Neurological:     Mental Status: She is alert.     BP 134/78   Pulse 85   Temp (!) 96.2 F (35.7 C) (Tympanic)   Ht _0  (1.626 m)   Wt (!) 321 lb (145.6 kg)   SpO2 96%   BMI 55.10 kg/m  Wt Readings from Last 3 Encounters:  12/24/19 (!) 321 lb (145.6 kg)  09/24/19 (!) 326 lb 12.8 oz (148.2 kg)  08/25/19 (!) 324 lb (147 kg)    Health Maintenance Due  Topic Date Due  . OPHTHALMOLOGY EXAM  Never done  . INFLUENZA VACCINE  09/12/2019  . MAMMOGRAM  09/17/2019    There are no preventive care reminders to display for this patient.   Lab Results  Component Value Date   TSH 1.760 09/07/2018   Lab Results  Component Value Date   WBC 5.7 03/09/2019   HGB 12.6 03/09/2019   HCT 39.3 03/09/2019   MCV 90.3 03/09/2019   PLT 343 03/09/2019   Lab Results  Component Value Date   NA 141 09/24/2019   K 4.2 09/24/2019   CO2 25 09/24/2019   GLUCOSE 89 09/24/2019   BUN 13 09/24/2019   CREATININE 0.62 09/24/2019   BILITOT 0.3 06/04/2019   ALKPHOS 83 06/04/2019   AST 23  06/04/2019   ALT 23 06/04/2019   PROT 6.6 06/04/2019   ALBUMIN 4.0 06/04/2019   CALCIUM 9.9 09/24/2019   ANIONGAP 10 03/09/2019   GFR 100.75 02/23/2018   Lab Results  Component Value Date   CHOL 129 05/04/2019   Lab Results  Component Value Date   HDL 48 05/04/2019   Lab Results  Component Value Date   LDLCALC 67 05/04/2019   Lab Results  Component Value Date   TRIG 68 05/04/2019   Lab Results  Component Value Date   CHOLHDL 2.7 05/04/2019   Lab Results  Component Value Date   HGBA1C 7.0 (H) 09/24/2019       Assessment & Plan:   Problem List Items Addressed This Visit    None    Visit Diagnoses    Herpes simplex    -  Primary   Relevant Medications   valACYclovir (VALTREX) 1000 MG tablet   Other Relevant Orders   Herpes simplex virus culture       Meds ordered this encounter  Medications  . valACYclovir (VALTREX) 1000 MG tablet    Sig: Take 1 tablet (1,000 mg total) by mouth 3 (three) times daily.    Dispense:  21 tablet    Refill:  0    Call the office with any questions or concerns. Recheck pending lab results,  Kennyth Arnold, FNP

## 2019-12-28 ENCOUNTER — Ambulatory Visit (INDEPENDENT_AMBULATORY_CARE_PROVIDER_SITE_OTHER): Payer: Managed Care, Other (non HMO) | Admitting: Bariatrics

## 2019-12-28 ENCOUNTER — Encounter (INDEPENDENT_AMBULATORY_CARE_PROVIDER_SITE_OTHER): Payer: Self-pay | Admitting: Bariatrics

## 2019-12-28 ENCOUNTER — Other Ambulatory Visit: Payer: Self-pay

## 2019-12-28 VITALS — BP 138/85 | HR 90 | Temp 98.3°F | Ht 64.0 in | Wt 316.0 lb

## 2019-12-28 DIAGNOSIS — Z6841 Body Mass Index (BMI) 40.0 and over, adult: Secondary | ICD-10-CM

## 2019-12-28 DIAGNOSIS — E1169 Type 2 diabetes mellitus with other specified complication: Secondary | ICD-10-CM

## 2019-12-28 DIAGNOSIS — I10 Essential (primary) hypertension: Secondary | ICD-10-CM

## 2019-12-28 DIAGNOSIS — E559 Vitamin D deficiency, unspecified: Secondary | ICD-10-CM | POA: Diagnosis not present

## 2019-12-28 DIAGNOSIS — Z9189 Other specified personal risk factors, not elsewhere classified: Secondary | ICD-10-CM

## 2019-12-28 LAB — HERPES SIMPLEX VIRUS CULTURE
MICRO NUMBER:: 11197180
SPECIMEN QUALITY:: ADEQUATE

## 2019-12-28 MED ORDER — VITAMIN D (ERGOCALCIFEROL) 1.25 MG (50000 UNIT) PO CAPS: 50000.0000 [IU] | ORAL_CAPSULE | ORAL | 0 refills | Status: DC

## 2019-12-28 NOTE — Progress Notes (Signed)
Chief Complaint:   OBESITY Ruth [name] is here to discuss her progress with her obesity treatment plan along with follow-up of her obesity related diagnoses. Ruth [name] is on the Category 3 Plan and states she is following her eating plan approximately 50-60% of the time. Ruth [name] states she is exercising 0 minutes 0 times per week.  Today's visit was #: 13 Starting weight: 336 lbs Starting date: 09/07/2018 Today's weight: 316 lbs Today's date: 12/28/2019 Total lbs lost to date: 20 Total lbs lost since last in-office visit: 8  Interim History: Ruth [name] is down 8 lbs and doing well overall. She is not snacking in between her meals.  Subjective:   Vitamin D deficiency. Shandria is taking Vitamin D supplementation.    Ref. Range 09/07/2018 12:22  Vitamin D, 25-Hydroxy Latest Ref Range: 30.0 - 100.0 ng/mL 24.9 (L)   Essential hypertension. Ruth [name] is taking amlodipine and atenolol. Blood pressure is reasonably well controlled.  BP Readings from Last 3 Encounters:  12/28/19 138/85  12/24/19 134/78  09/24/19 132/88   Lab Results  Component Value Date   CREATININE 0.62 09/24/2019   CREATININE 0.58 04/06/2019   CREATININE 0.52 03/09/2019   Type 2 diabetes mellitus with other specified complication, without long-term current use of insulin ([place]). Ruth [name] is taking Glucotrol, metformin, and Ozempic.   Lab Results  Component Value Date   HGBA1C 7.0 (H) 09/24/2019   HGBA1C 7.8 (H) 06/04/2019   HGBA1C 11.2 (H) 03/06/2019   Lab Results  Component Value Date   MICROALBUR 0.4 08/21/2018   LDLCALC 67 05/04/2019   CREATININE 0.62 09/24/2019   No results found for: INSULIN  At risk for hyperglycemia. [name] is at risk for hyperglycemia due to insulin resistance, prediabetes or diabetes.  Assessment/Plan:   Vitamin D deficiency. Low Vitamin D level contributes to fatigue and are associated with obesity, breast, and colon cancer. She agrees to continue to take prescription Vitamin D,  Ergocalciferol, (DRISDOL) 1.25 MG (50000 UNIT) CAPS capsule every week #12 with 0 refills and will follow-up for routine testing of Vitamin D, at least 2-3 times per year to avoid over-replacement.   Essential hypertension. [name] is working on healthy weight loss and exercise to improve blood pressure control. We will watch for signs of hypotension as she continues her lifestyle modifications. She will continue her medications as directed.   Type 2 diabetes mellitus with other specified complication, without long-term current use of insulin ([place]). Good blood sugar control is important to decrease the likelihood of diabetic complications such as nephropathy, neuropathy, limb loss, blindness, coronary artery disease, and death. Intensive lifestyle modification including diet, exercise and weight loss are the first line of treatment for diabetes. Ruth [name] will continue her medication as directed. She will decrease carbohydrates and refined sweets and increase activities.  At risk for hyperglycemia. Ruth [name] was given approximately 15 minutes of counseling today regarding prevention of hyperglycemia. She was advised of hyperglycemia causes and the fact hyperglycemia is often asymptomatic. Ruth [name] was instructed to avoid skipping meals, eat regular protein rich meals and schedule low calorie but protein rich snacks as needed.   Repetitive spaced learning was employed today to elicit superior memory formation and behavioral change.  Class 3 severe obesity with serious comorbidity and body mass index (BMI) of 50.0 to 59.9 in adult, unspecified obesity type ([place]).  Ruth [name] is currently in the action stage of change. As such, her goal is to continue with weight loss efforts. She has agreed to the Category 3  Plan.   She will work on meal planning, mindful eating, and decreasing portion sizes.  Exercise goals: All adults should avoid inactivity. Some physical activity is better than none, and adults who participate  in any amount of physical activity gain some health benefits.  Behavioral modification strategies: increasing lean protein intake, decreasing simple carbohydrates, increasing vegetables, increasing water intake, decreasing eating out, no skipping meals, meal planning and cooking strategies, keeping healthy foods in the home and planning for success.  Ruth [name] has agreed to follow-up with our clinic fasting in 2 weeks. She was informed of the importance of frequent follow-up visits to maximize her success with intensive lifestyle modifications for her multiple health conditions.   Objective:   Blood pressure 138/85, pulse 90, temperature 98.3 F (36.8 C), temperature source Oral, height 5\' 4"  (1.626 m), weight (!) 316 lb (143.3 kg), SpO2 99 %. Body mass index is 54.24 kg/m.  General: Cooperative, alert, well developed, in no acute distress. HEENT: Conjunctivae and lids unremarkable. Cardiovascular: Regular rhythm.  Lungs: Normal work of breathing. Neurologic: No focal deficits.   Lab Results  Component Value Date   CREATININE 0.62 09/24/2019   BUN 13 09/24/2019   NA 141 09/24/2019   K 4.2 09/24/2019   CL 104 09/24/2019   CO2 25 09/24/2019   Lab Results  Component Value Date   ALT 23 06/04/2019   AST 23 06/04/2019   ALKPHOS 83 06/04/2019   BILITOT 0.3 06/04/2019   Lab Results  Component Value Date   HGBA1C 7.0 (H) 09/24/2019   HGBA1C 7.8 (H) 06/04/2019   HGBA1C 11.2 (H) 03/06/2019   HGBA1C 11.6 (H) 02/10/2019   HGBA1C 8.3 (H) 08/21/2018   No results found for: INSULIN Lab Results  Component Value Date   TSH 1.760 09/07/2018   Lab Results  Component Value Date   CHOL 129 05/04/2019   HDL 48 05/04/2019   LDLCALC 67 05/04/2019   TRIG 68 05/04/2019   CHOLHDL 2.7 05/04/2019   Lab Results  Component Value Date   WBC 5.7 03/09/2019   HGB 12.6 03/09/2019   HCT 39.3 03/09/2019   MCV 90.3 03/09/2019   PLT 343 03/09/2019   Lab Results  Component Value Date    FERRITIN 837 (H) 03/08/2019   Attestation Statements:   Reviewed by clinician on day of visit: allergies, medications, problem list, medical history, surgical history, family history, social history, and previous encounter notes.  Migdalia Dk, am acting as Location manager for [employer], DO   I have reviewed the above documentation for accuracy and completeness, and I agree with the above. Jearld Lesch, DO

## 2020-01-18 ENCOUNTER — Other Ambulatory Visit: Payer: Self-pay

## 2020-01-18 ENCOUNTER — Ambulatory Visit (INDEPENDENT_AMBULATORY_CARE_PROVIDER_SITE_OTHER): Payer: Managed Care, Other (non HMO) | Admitting: Adult Health

## 2020-01-18 ENCOUNTER — Encounter (INDEPENDENT_AMBULATORY_CARE_PROVIDER_SITE_OTHER): Payer: Self-pay | Admitting: Adult Health

## 2020-01-18 VITALS — BP 132/82 | HR 85 | Temp 98.0°F | Ht 64.0 in | Wt 314.0 lb

## 2020-01-18 DIAGNOSIS — E559 Vitamin D deficiency, unspecified: Secondary | ICD-10-CM | POA: Diagnosis not present

## 2020-01-18 DIAGNOSIS — Z9189 Other specified personal risk factors, not elsewhere classified: Secondary | ICD-10-CM | POA: Diagnosis not present

## 2020-01-18 DIAGNOSIS — I10 Essential (primary) hypertension: Secondary | ICD-10-CM

## 2020-01-18 DIAGNOSIS — E1169 Type 2 diabetes mellitus with other specified complication: Secondary | ICD-10-CM | POA: Diagnosis not present

## 2020-01-18 DIAGNOSIS — Z6841 Body Mass Index (BMI) 40.0 and over, adult: Secondary | ICD-10-CM

## 2020-01-18 NOTE — Progress Notes (Signed)
Chief Complaint:   OBESITY Ruth [name] is here to discuss her progress with her obesity treatment plan along with follow-up of her obesity related diagnoses. Ruth [name] is on the Category 3 Plan and states she is following her eating plan approximately 50% of the time. Ruth [name] states she is exercising 0 minutes 0 times per week.  Today's visit was #: 14 Starting weight: 336 lbs Starting date: 09/07/2018 Today's weight: 314 lbs Today's date: 01/18/2020 Total lbs lost to date: 22 Total lbs lost since last in-office visit: 2  Interim History: Ruth [name] has been frequently snacking on high sugar/carbohydrate foods, i.e., Snickers, chocolate. She has not been keeping track of calories for daily snack calories.    Subjective:   Essential hypertension. Blood pressure and heart rate are stable on today's office visit. Ruth [name] is on amlodipine 5 mg daily and atenolol 50 mg daily. She denies cardiac symptoms.  BP Readings from Last 3 Encounters:  01/18/20 132/82  12/28/19 138/85  12/24/19 134/78   Lab Results  Component Value Date   CREATININE 0.62 09/24/2019   CREATININE 0.58 04/06/2019   CREATININE 0.52 03/09/2019   Vitamin D deficiency. Last Vitamin D level 24.9 on 09/07/2018, well below goal of 50. Ruth [name] is on Ergocalciferol No nausea, vomiting, or muscle weakness.    Ref. Range 09/07/2018 12:22  Vitamin D, 25-Hydroxy Latest Ref Range: 30.0 - 100.0 ng/mL 24.9 (L)   Type 2 diabetes mellitus with other specified complication, without long-term current use of insulin ([place]). Ruth [name] is on metformin 850 mg BID, Semaglutide 1 mg once a week, and glipizide 2.5 mg daily. She denies episodes of hypoglycemia.  Lab Results  Component Value Date   HGBA1C 7.0 (H) 09/24/2019   HGBA1C 7.8 (H) 06/04/2019   HGBA1C 11.2 (H) 03/06/2019   Lab Results  Component Value Date   MICROALBUR 0.4 08/21/2018   LDLCALC 67 05/04/2019   CREATININE 0.62 09/24/2019   No results found for: INSULIN  At risk  for dehydration. Ruth [name] is at increased risk for dehydration due to limited water intake.  Assessment/Plan:   Essential hypertension. Ruth [name] is working on healthy weight loss and exercise to improve blood pressure control. We will watch for signs of hypotension as she continues her lifestyle modifications. Labs will be checked today.   Vitamin D deficiency. Low Vitamin D level contributes to fatigue and are associated with obesity, breast, and colon cancer. VITAMIN D 25 Hydroxy (Vit-D Deficiency, Fractures) level will be checked today.   Type 2 diabetes mellitus with other specified complication, without long-term current use of insulin ([place]). Good blood sugar control is important to decrease the likelihood of diabetic complications such as nephropathy, neuropathy, limb loss, blindness, coronary artery disease, and death. Intensive lifestyle modification including diet, exercise and weight loss are the first line of treatment for diabetes. Labs will be checked today.   At risk for dehydration. Ruth [name] was given approximately 15 minutes dehydration prevention counseling today. Ruth [name] is at risk for dehydration due to weight loss and current medication(s). She was encouraged to hydrate and monitor fluid status to avoid dehydration as well as weight loss plateaus.   Class 3 severe obesity with serious comorbidity and body mass index (BMI) of 50.0 to 59.9 in adult, unspecified obesity type ([place]).  Ruth [name] is currently in the action stage of change. As such, her goal is to continue with weight loss efforts. She has agreed to the Category 3 Plan.   Exercise goals: All adults should avoid inactivity.  Some physical activity is better than none, and adults who participate in any amount of physical activity gain some health benefits. Ruth [name] will increase her daily walking and use a stationary cycle machine.  Behavioral modification strategies: increasing lean protein intake, decreasing simple carbohydrates,  increasing water intake, better snacking choices and planning for success.  Ruth [name] has agreed to follow-up with our clinic in 2 weeks. She was informed of the importance of frequent follow-up visits to maximize her success with intensive lifestyle modifications for her multiple health conditions.   Ruth [name] was informed we would discuss her lab results at her next visit unless there is a critical issue that needs to be addressed sooner. Ruth [name] agreed to keep her next visit at the agreed upon time to discuss these results.  Objective:   Blood pressure 132/82, pulse 85, temperature 98 F (36.7 C), height 5\' 4"  (1.626 m), weight (!) 314 lb (142.4 kg), SpO2 98 %. Body mass index is 53.9 kg/m.  General: Cooperative, alert, well developed, in no acute distress. HEENT: Conjunctivae and lids unremarkable. Cardiovascular: Regular rhythm.  Lungs: Normal work of breathing. Neurologic: No focal deficits.   Lab Results  Component Value Date   CREATININE 0.62 09/24/2019   BUN 13 09/24/2019   NA 141 09/24/2019   K 4.2 09/24/2019   CL 104 09/24/2019   CO2 25 09/24/2019   Lab Results  Component Value Date   ALT 23 06/04/2019   AST 23 06/04/2019   ALKPHOS 83 06/04/2019   BILITOT 0.3 06/04/2019   Lab Results  Component Value Date   HGBA1C 7.0 (H) 09/24/2019   HGBA1C 7.8 (H) 06/04/2019   HGBA1C 11.2 (H) 03/06/2019   HGBA1C 11.6 (H) 02/10/2019   HGBA1C 8.3 (H) 08/21/2018   No results found for: INSULIN Lab Results  Component Value Date   TSH 1.760 09/07/2018   Lab Results  Component Value Date   CHOL 129 05/04/2019   HDL 48 05/04/2019   LDLCALC 67 05/04/2019   TRIG 68 05/04/2019   CHOLHDL 2.7 05/04/2019   Lab Results  Component Value Date   WBC 5.7 03/09/2019   HGB 12.6 03/09/2019   HCT 39.3 03/09/2019   MCV 90.3 03/09/2019   PLT 343 03/09/2019   Lab Results  Component Value Date   FERRITIN 837 (H) 03/08/2019   Attestation Statements:   Reviewed by clinician on day of  visit: allergies, medications, problem list, medical history, surgical history, family history, social history, and previous encounter notes.  I, Michaelene Song, am acting as Location manager for [employer], NP-C   I have reviewed the above documentation for accuracy and completeness, and I agree with the above. -  Lashe Oliveira d. Derica Leiber, NP-C

## 2020-01-19 LAB — COMPREHENSIVE METABOLIC PANEL
ALT: 19 IU/L (ref 0–32)
AST: 16 IU/L (ref 0–40)
Albumin/Globulin Ratio: 1.4 (ref 1.2–2.2)
Albumin: 3.9 g/dL (ref 3.8–4.9)
Alkaline Phosphatase: 92 IU/L (ref 44–121)
BUN/Creatinine Ratio: 18 (ref 9–23)
BUN: 12 mg/dL (ref 6–24)
Bilirubin Total: 0.4 mg/dL (ref 0.0–1.2)
CO2: 25 mmol/L (ref 20–29)
Calcium: 9.4 mg/dL (ref 8.7–10.2)
Chloride: 107 mmol/L — ABNORMAL HIGH (ref 96–106)
Creatinine, Ser: 0.66 mg/dL (ref 0.57–1.00)
GFR calc Af Amer: 112 mL/min/{1.73_m2} (ref >59)
GFR calc non Af Amer: 97 mL/min/{1.73_m2} (ref >59)
Globulin, Total: 2.7 g/dL (ref 1.5–4.5)
Glucose: 118 mg/dL — ABNORMAL HIGH (ref 65–99)
Potassium: 4.3 mmol/L (ref 3.5–5.2)
Sodium: 144 mmol/L (ref 134–144)
Total Protein: 6.6 g/dL (ref 6.0–8.5)

## 2020-01-19 LAB — HEMOGLOBIN A1C
Est. average glucose Bld gHb Est-mCnc: 146 mg/dL
Hgb A1c MFr Bld: 6.7 % — ABNORMAL HIGH (ref 4.8–5.6)

## 2020-01-19 LAB — INSULIN, RANDOM: INSULIN: 26.5 u[IU]/mL — ABNORMAL HIGH (ref 2.6–24.9)

## 2020-01-19 LAB — VITAMIN D 25 HYDROXY (VIT D DEFICIENCY, FRACTURES): Vit D, 25-Hydroxy: 40.5 ng/mL (ref 30.0–100.0)

## 2020-02-14 ENCOUNTER — Ambulatory Visit (INDEPENDENT_AMBULATORY_CARE_PROVIDER_SITE_OTHER): Payer: Managed Care, Other (non HMO) | Admitting: Bariatrics

## 2020-02-25 ENCOUNTER — Other Ambulatory Visit: Payer: Self-pay

## 2020-02-29 ENCOUNTER — Ambulatory Visit: Payer: Managed Care, Other (non HMO) | Admitting: Nurse Practitioner

## 2020-02-29 ENCOUNTER — Other Ambulatory Visit: Payer: Self-pay

## 2020-02-29 ENCOUNTER — Encounter: Payer: Self-pay | Admitting: Nurse Practitioner

## 2020-02-29 VITALS — BP 118/82 | HR 104 | Temp 97.6°F | Ht 65.0 in | Wt 322.6 lb

## 2020-02-29 DIAGNOSIS — M7582 Other shoulder lesions, left shoulder: Secondary | ICD-10-CM

## 2020-02-29 MED ORDER — IBUPROFEN 800 MG PO TABS: 800.0000 mg | ORAL_TABLET | Freq: Three times a day (TID) | ORAL | 1 refills | Status: DC | PRN

## 2020-02-29 NOTE — Patient Instructions (Addendum)
Rotator Cuff Tendinitis  Rotator cuff tendinitis is inflammation of the tendons in the rotator cuff. Tendons are tough, cord-like bands that connect muscle to bone. The rotator cuff includes all of the muscles and tendons that connect the arm to the shoulder. The rotator cuff holds the head of the humerus, or the upper arm bone, in the cup of the shoulder blade (scapula). This condition can lead to a long-term or chronic tear. The tear may be partial or complete. What are the causes? This condition is usually caused by overusing the rotator cuff. What increases the risk? This condition is more likely to develop in athletes and workers who frequently use their shoulder or reach over their heads. This can include activities such as:  Tennis.  Baseball or softball.  Swimming.  Construction work.  Painting. What are the signs or symptoms? Symptoms of this condition include:  Pain that spreads (radiates) from the shoulder to the upper arm.  Swelling and tenderness in front of the shoulder.  Pain when reaching, pulling, or lifting the arm above the head.  Pain when lowering the arm from above the head.  Minor pain in the shoulder when resting.  Increased pain in the shoulder at night.  Difficulty placing the arm behind the back. How is this diagnosed? This condition is diagnosed with a physical exam and medical history. Tests may also be done, including:  X-rays.  MRI.  Ultrasound.  CT with or without contrast. How is this treated? Treatment for this condition depends on the severity of the condition. In less severe cases, treatment may include:  Rest. This may be done with a sling that holds the shoulder still (immobilization). Your health care provider may also recommend avoiding activities that involve lifting your arm over your head.  Icing the shoulder.  Anti-inflammatory medicines, such as aspirin or ibuprofen. In more severe cases, treatment may  include:  Physical therapy.  Steroid injections.  Surgery. Follow these instructions at home: If you have a sling:  Wear the sling as told by your health care provider. Remove it only as told by your health care provider.  Loosen it if your fingers tingle, become numb, or turn cold and blue.  Keep it clean.  If the sling is not waterproof: ? Do not let it get wet. ? Cover it with a watertight covering when you take a bath or shower. Managing pain, stiffness, and swelling  If directed, put ice on the injured area. To do this: ? If you have a removable sling, remove it as told by your health care provider. ? Put ice in a plastic bag. ? Place a towel between your skin and the bag. ? Leave the ice on for 20 minutes, 2-3 times a day.  Move your fingers often to reduce stiffness and swelling.  Raise (elevate) the injured area above the level of your heart while you are lying down.  Find a comfortable sleeping position, or sleep in a recliner, if available.   Activity  Rest your shoulder as told by your health care provider.  Ask your health care provider when it is safe to drive if you have a sling on your arm.  Return to your normal activities as told by your health care provider. Ask your health care provider what activities are safe for you.  Do any exercises or stretches as told by your health care provider or physical therapist.  If you do repetitive overhead tasks, take small breaks in between and include   stretching exercises as told by your health care provider. General instructions  Do not use any products that contain nicotine or tobacco, such as cigarettes, e-cigarettes, and chewing tobacco. These can delay healing. If you need help quitting, ask your health care provider.  Take over-the-counter and prescription medicines only as told by your health care provider.  Keep all follow-up visits as told by your health care provider. This is important. Contact a health  care provider if:  Your pain gets worse.  You have new pain in your arm, hands, or fingers.  Your pain is not relieved with medicine or does not get better after 6 weeks of treatment.  You have crackling sensations when moving your shoulder in certain directions.  You hear a snapping sound after using your shoulder, followed by severe pain and weakness. Get help right away if:  Your arm, hand, or fingers are numb or tingling.  Your arm, hand, or fingers are swollen or painful or they turn white or blue. Summary  Rotator cuff tendinitis is inflammation of the tendons in the rotator cuff. Tendons are tough, cord-like bands that connect muscle to bone.  This condition is usually caused by overusing the rotator cuff, which includes all of the muscles and tendons that connect the arm to the shoulder.  This condition is more likely to develop in athletes and workers who frequently use their shoulder or reach over their heads.  Treatment generally includes rest, anti-inflammatory medicines, and icing. In some cases, physical therapy and steroid injections may be needed. In severe cases, surgery may be needed. This information is not intended to replace advice given to you by your health care provider. Make sure you discuss any questions you have with your health care provider. Document Revised: 11/02/2018 Document Reviewed: 11/02/2018 Elsevier Patient Education  2021 Elsevier Inc.  

## 2020-02-29 NOTE — Progress Notes (Signed)
Subjective:  Patient ID: Ruth [name], female    DOB: 03-Sep-1960  Age: 60 y.o. MRN: [id number]  CC: Acute Visit (Pt c/o bilateral arm pain, left worse than right x3 weeks. Pt denies any known injury. )  Shoulder Pain  The pain is present in the right shoulder and left shoulder. This is a chronic problem. The current episode started more than 1 month ago. There has been no history of extremity trauma. The problem occurs constantly. The problem has been gradually worsening. The quality of the pain is described as aching and dull. Associated symptoms include a limited range of motion and stiffness. Pertinent negatives include no fever, inability to bear weight, itching, joint locking, joint swelling, numbness or tingling. The symptoms are aggravated by activity. She has tried nothing for the symptoms. Family history does not include gout or rheumatoid arthritis. Her past medical history is significant for diabetes and osteoarthritis.   Reviewed past Medical, Social and Family history today.  Outpatient Medications Prior to Visit  Medication Sig Dispense Refill  . Accu-Chek Softclix Lancets lancets USE AS DIRECTED TO TEST BLOOD SUGAR TWICE DAILY(BEFORE BREAKFAST AND AT BEDTIME) 100 each 11  . amLODipine (NORVASC) 5 MG tablet Take 1 tablet (5 mg total) by mouth at bedtime. (Patient taking differently: Take 5 mg by mouth daily.) 90 tablet 1  . Ascorbic Acid (VITAMIN C PO) Take 1 tablet by mouth daily.     Marland Kitchen atenolol (TENORMIN) 50 MG tablet Take 1 tablet (50 mg total) by mouth daily. 90 tablet 3  . atorvastatin (LIPITOR) 40 MG tablet Take 1 tablet (40 mg total) by mouth daily at 6 PM. 90 tablet 3  . Blood Glucose Monitoring Suppl (ACCU-CHEK GUIDE ME) w/Device KIT CHECK GLUCOSE AT BREAKFAST AND BEDTIME 1 kit 0  . glipiZIDE (GLUCOTROL XL) 2.5 MG 24 hr tablet Take 1 tablet (2.5 mg total) by mouth daily with breakfast. With heaviest meals 90 tablet 1  . glucose blood test strip Check blood glucose before  breakfast and at bedtime. Accu Chek test strips 100 each 12  . Insulin Pen Needle (NOVOFINE) 30G X 8 MM MISC Inject 10 each into the skin as needed. 1 packet 2  . metFORMIN (GLUCOPHAGE) 850 MG tablet Take 1 tablet (850 mg total) by mouth 2 (two) times daily with a meal. (Patient taking differently: Take 850 mg by mouth daily.) 180 tablet 3  . Multiple Vitamins-Minerals (MULTIVITAMIN WITH MINERALS) tablet Take 1 tablet by mouth daily.    . ondansetron (ZOFRAN) 4 MG tablet Take 1 tablet (4 mg total) by mouth every 8 (eight) hours as needed for nausea or vomiting. 20 tablet 0  . Semaglutide, 1 MG/DOSE, (OZEMPIC, 1 MG/DOSE,) 2 MG/1.5ML SOPN Inject 0.75 mLs (1 mg total) into the skin once a week. 3 mL 5  . traMADol (ULTRAM) 50 MG tablet Take 50 mg by mouth every 6 (six) hours as needed (pain).     . valACYclovir (VALTREX) 1000 MG tablet Take 1 tablet (1,000 mg total) by mouth 3 (three) times daily. 21 tablet 0  . Vitamin D, Ergocalciferol, (DRISDOL) 1.25 MG (50000 UNIT) CAPS capsule Take 1 capsule (50,000 Units total) by mouth every 7 (seven) days. 12 capsule 0  . ibuprofen (ADVIL) 800 MG tablet Take 800 mg by mouth every 8 (eight) hours as needed (pain).     No facility-administered medications prior to visit.    ROS See HPI  Objective:  BP 118/82 (BP Location: Right Arm, Patient Position: Sitting,  Cuff Size: Large)   Pulse (!) 104   Temp 97.6 F (36.4 C) (Temporal)   Ht 5' 5"  (1.651 m)   Wt (!) 322 lb 9.6 oz (146.3 kg)   SpO2 97%   BMI 53.68 kg/m   Physical Exam Vitals reviewed.  Constitutional:      Appearance: She is obese.  Cardiovascular:     Pulses: Normal pulses.  Pulmonary:     Effort: Pulmonary effort is normal.  Musculoskeletal:        General: Tenderness present.     Right shoulder: Tenderness present. No swelling, deformity, effusion, bony tenderness or crepitus. Decreased range of motion. Normal strength. Normal pulse.     Left shoulder: Tenderness present. No  swelling, deformity, effusion, bony tenderness or crepitus. Decreased range of motion. Normal strength. Normal pulse.     Right upper arm: Normal.     Left upper arm: Normal.     Right elbow: Normal.     Left elbow: Normal.     Right forearm: Normal.     Left forearm: Normal.  Skin:    Findings: No erythema or rash.  Neurological:     Mental Status: She is alert and oriented to person, place, and time.    Assessment & Plan:  This visit occurred during the SARS-CoV-2 public health emergency.  Safety protocols were in place, including screening questions prior to the visit, additional usage of staff PPE, and extensive cleaning of exam room while observing appropriate contact time as indicated for disinfecting solutions.   Ruth [name] was seen today for acute visit.  Diagnoses and all orders for this visit:  Tendinitis of left rotator cuff -     Ambulatory referral to Physical Therapy -     ibuprofen (ADVIL) 800 MG tablet; Take 1 tablet (800 mg total) by mouth every 8 (eight) hours as needed (pain).    Problem List Items Addressed This Visit   None   Visit Diagnoses    Tendinitis of left rotator cuff    -  Primary   Relevant Medications   ibuprofen (ADVIL) 800 MG tablet   Other Relevant Orders   Ambulatory referral to Physical Therapy      Follow-up: Return in about 3 months (around 05/29/2020) for CPE (fasting).  [name], NP

## 2020-03-02 ENCOUNTER — Encounter (INDEPENDENT_AMBULATORY_CARE_PROVIDER_SITE_OTHER): Payer: Self-pay | Admitting: Bariatrics

## 2020-03-02 ENCOUNTER — Telehealth (INDEPENDENT_AMBULATORY_CARE_PROVIDER_SITE_OTHER): Payer: Managed Care, Other (non HMO) | Admitting: Bariatrics

## 2020-03-02 ENCOUNTER — Encounter: Payer: Self-pay | Admitting: Nurse Practitioner

## 2020-03-02 ENCOUNTER — Other Ambulatory Visit: Payer: Self-pay | Admitting: Nurse Practitioner

## 2020-03-02 DIAGNOSIS — E559 Vitamin D deficiency, unspecified: Secondary | ICD-10-CM

## 2020-03-02 DIAGNOSIS — Z6841 Body Mass Index (BMI) 40.0 and over, adult: Secondary | ICD-10-CM | POA: Diagnosis not present

## 2020-03-02 DIAGNOSIS — E1169 Type 2 diabetes mellitus with other specified complication: Secondary | ICD-10-CM

## 2020-03-02 DIAGNOSIS — E782 Mixed hyperlipidemia: Secondary | ICD-10-CM

## 2020-03-02 MED ORDER — VITAMIN D (ERGOCALCIFEROL) 1.25 MG (50000 UNIT) PO CAPS: 50000.0000 [IU] | ORAL_CAPSULE | ORAL | 0 refills | Status: DC

## 2020-03-03 ENCOUNTER — Encounter: Payer: Self-pay | Admitting: Nurse Practitioner

## 2020-03-03 MED ORDER — ATORVASTATIN CALCIUM 40 MG PO TABS: 40.0000 mg | ORAL_TABLET | ORAL | 3 refills | Status: DC

## 2020-03-03 NOTE — Addendum Note (Signed)
Addended by: Leana Gamer on: 03/03/2020 04:08 PM   Modules accepted: Orders

## 2020-03-03 NOTE — Telephone Encounter (Signed)
Pt called office to ask if [facility] can change date on work note.  Pt needed the to be until Monday, 03/06/20. Please advise.

## 2020-03-06 ENCOUNTER — Encounter (INDEPENDENT_AMBULATORY_CARE_PROVIDER_SITE_OTHER): Payer: Self-pay | Admitting: Bariatrics

## 2020-03-06 NOTE — Progress Notes (Addendum)
TeleHealth Visit:  Due to the COVID-19 pandemic, this visit was completed with telemedicine (audio/video) technology to reduce patient and provider exposure as well as to preserve personal protective equipment.   Ruth [name] has verbally consented to this TeleHealth visit. The patient is located at home, the provider is located at the [facility] office. The participants in this visit include the listed provider and patient. The visit was conducted today via telephone.  Ruth [name] was unable to use realtime audiovisual technology today and the telehealth visit was conducted via telephone.   Time spent on the visit including pre-visit chart review ad post-visit care was 25 minutes.   Chief Complaint: OBESITY Ruth [name] is here to discuss her progress with her obesity treatment plan along with follow-up of her obesity related diagnoses. Wendy is on the Category 3 Plan and states she is following her eating plan approximately 55% of the time. Ruth [name] states she is not exercising regularly at this time.  Today's visit was #: 15 Starting weight: 336 lbs Starting date: 09/07/2018  Interim History: Ruth [name] states that she has maintained her weight.  She denies problems.  She is eating more carbohydrates.  Subjective:   1. Type 2 diabetes mellitus with other specified complication, without long-term current use of insulin ([facility]) Controlled.  She is not taking glipizide.  A1c is 6.7.  FBS 120s-130s.  Lab Results  Component Value Date   HGBA1C 6.7 (H) 01/18/2020   HGBA1C 7.0 (H) 09/24/2019   HGBA1C 7.8 (H) 06/04/2019   Lab Results  Component Value Date   MICROALBUR 0.4 08/21/2018   LDLCALC 67 05/04/2019   CREATININE 0.66 01/18/2020   Lab Results  Component Value Date   INSULIN 26.5 (H) 01/18/2020   2. Vitamin D deficiency Ruth [name]'s Vitamin D level was 40.5 on 01/18/2020. She is currently taking prescription vitamin D 50,000 IU each week. She denies nausea, vomiting or muscle  weakness.  Assessment/Plan:   1. Type 2 diabetes mellitus with other specified complication, without long-term current use of insulin ([facility]) Good blood sugar control is important to decrease the likelihood of diabetic complications such as nephropathy, neuropathy, limb loss, blindness, coronary artery disease, and death. Intensive lifestyle modification including diet, exercise and weight loss are the first line of treatment for diabetes.  She will continue medications.  Only taking glipizide when blood sugar is 150-160.  2. Vitamin D deficiency Low Vitamin D level contributes to fatigue and are associated with obesity, breast, and colon cancer. She agrees to continue to take prescription Vitamin D @50 ,000 IU every week and will follow-up for routine testing of Vitamin D, at least 2-3 times per year to avoid over-replacement.  -Refill Vitamin D, Ergocalciferol, (DRISDOL) 1.25 MG (50000 UNIT) CAPS capsule; Take 1 capsule (50,000 Units total) by mouth every 7 (seven) days.  Dispense: 12 capsule; Refill: 0  3. Class 3 severe obesity due to excess calories with serious comorbidity and body mass index (BMI) of 50.0 to 59.9 in adult [facility])  Ruth [name] is currently in the action stage of change. As such, her goal is to maintain weight for now. She has agreed to the Category 3 Plan.   She will work on meal planning, intentional eating, and increasing her water intake by 8 ounces daily.  Labs from 01/18/2020 were reviewed, including CMP, vitamin D, and A1c,  Exercise goals: All adults should avoid inactivity. Some physical activity is better than none, and adults who participate in any amount of physical activity gain some health  benefits.  Behavioral modification strategies: increasing lean protein intake, decreasing simple carbohydrates, increasing vegetables, increasing water intake, decreasing eating out, no skipping meals, meal planning and cooking strategies, keeping healthy foods in the home and planning  for success.  Ruth [name] has agreed to follow-up with our clinic in 2-3 weeks. She was informed of the importance of frequent follow-up visits to maximize her success with intensive lifestyle modifications for her multiple health conditions.  Objective:   VITALS: Per patient if applicable, see vitals. GENERAL: Alert and in no acute distress. CARDIOPULMONARY: No increased WOB. Speaking in clear sentences.  PSYCH: Pleasant and cooperative. Speech normal rate and rhythm. Affect is appropriate. Insight and judgement are appropriate. Attention is focused, linear, and appropriate.  NEURO: Oriented as arrived to appointment on time with no prompting.   Lab Results  Component Value Date   CREATININE 0.66 01/18/2020   BUN 12 01/18/2020   NA 144 01/18/2020   K 4.3 01/18/2020   CL 107 (H) 01/18/2020   CO2 25 01/18/2020   Lab Results  Component Value Date   ALT 19 01/18/2020   AST 16 01/18/2020   ALKPHOS 92 01/18/2020   BILITOT 0.4 01/18/2020   Lab Results  Component Value Date   HGBA1C 6.7 (H) 01/18/2020   HGBA1C 7.0 (H) 09/24/2019   HGBA1C 7.8 (H) 06/04/2019   HGBA1C 11.2 (H) 03/06/2019   HGBA1C 11.6 (H) 02/10/2019   Lab Results  Component Value Date   INSULIN 26.5 (H) 01/18/2020   Lab Results  Component Value Date   TSH 1.760 09/07/2018   Lab Results  Component Value Date   CHOL 129 05/04/2019   HDL 48 05/04/2019   LDLCALC 67 05/04/2019   TRIG 68 05/04/2019   CHOLHDL 2.7 05/04/2019   Lab Results  Component Value Date   WBC 5.7 03/09/2019   HGB 12.6 03/09/2019   HCT 39.3 03/09/2019   MCV 90.3 03/09/2019   PLT 343 03/09/2019   Lab Results  Component Value Date   FERRITIN 837 (H) 03/08/2019   Attestation Statements:   Reviewed by clinician on day of visit: allergies, medications, problem list, medical history, surgical history, family history, social history, and previous encounter notes.  I, Water quality scientist, CMA, am acting as Location manager for [employer], DO  I  have reviewed the above documentation for accuracy and completeness, and I agree with the above. Jearld Lesch, DO

## 2020-03-07 ENCOUNTER — Encounter: Payer: Self-pay | Admitting: Nurse Practitioner

## 2020-03-23 ENCOUNTER — Ambulatory Visit: Payer: Managed Care, Other (non HMO) | Attending: Nurse Practitioner

## 2020-03-23 ENCOUNTER — Other Ambulatory Visit: Payer: Self-pay

## 2020-03-23 DIAGNOSIS — R293 Abnormal posture: Secondary | ICD-10-CM

## 2020-03-23 DIAGNOSIS — M25611 Stiffness of right shoulder, not elsewhere classified: Secondary | ICD-10-CM | POA: Insufficient documentation

## 2020-03-23 DIAGNOSIS — M25511 Pain in right shoulder: Secondary | ICD-10-CM | POA: Diagnosis present

## 2020-03-23 DIAGNOSIS — M25612 Stiffness of left shoulder, not elsewhere classified: Secondary | ICD-10-CM | POA: Insufficient documentation

## 2020-03-23 DIAGNOSIS — M6281 Muscle weakness (generalized): Secondary | ICD-10-CM

## 2020-03-23 DIAGNOSIS — M25512 Pain in left shoulder: Secondary | ICD-10-CM | POA: Insufficient documentation

## 2020-03-23 NOTE — Therapy (Addendum)
[place], [place], [address] Phone: [phone]   Fax:  [phone]  Physical Therapy Evaluation/Discharge  Patient Details  Name: Ruth [name] MRN: [id number] Date of Birth: 06-Jun-1960 Referring Provider (PT): Flossie Buffy   Encounter Date: 03/23/2020   PT End of Session - 03/23/20 1736    Visit Number 1    Number of Visits 13    Date for PT Re-Evaluation 05/06/20    PT Start Time 9030    PT Stop Time 1800    PT Time Calculation (min) 44 min    Activity Tolerance Patient tolerated treatment well    Behavior During Therapy [facility] for tasks assessed/performed           Past Medical History:  Diagnosis Date  . Anemia   . Arthritis   . Back pain   . Bilateral calcaneal spurs   . Diabetes mellitus without complication ([place])    type 2  . GERD (gastroesophageal reflux disease)   . Gout   . Hyperlipidemia   . Hypertension   . Obesity   . Osteoarthritis   . Palpitations   . Swallowing difficulty   . Vitamin D deficiency     Past Surgical History:  Procedure Laterality Date  . ABDOMINAL HYSTERECTOMY     partial  . CHOLECYSTECTOMY    . COLONOSCOPY WITH PROPOFOL N/A 10/29/2016   Procedure: COLONOSCOPY WITH PROPOFOL;  Surgeon: Yetta Flock, MD;  Location: [facility];  Service: Gastroenterology;  Laterality: N/A;  . DENTAL SURGERY    . ORIF TIBIA & FIBULA FRACTURES Left     There were no vitals filed for this visit.    Subjective Assessment - 03/23/20 1722    Subjective Patient reports pain with reaching up high with both shoulders and they are achy at night when she tries to lay on her side it is painful, so sleep is difficult. She has been taking tylenol/ibuprofen which has helped with her pain at night. She reports her pain started about 6 weeks ago without known cause. She just woke up and began having pain with reaching. She reports the Rt shoulder seems to be a little better  since initial onset of pain, but the Lt is about the same. Patient denies any previous injury to her shoulders. She reports occasional numbness/tingling in her fingers before her shoulders ever bothered her. She denies pain at rest, but reports 8/10 pain with reaching described as ache.    Pertinent History see PMH above    Limitations Lifting;House hold activities;Other (comment)   reaching   Patient Stated Goals "to have no pain and use my arms like normal"    Currently in Pain? No/denies    Aggravating Factors  reaching, laying on her side    Pain Relieving Factors pain meds    Effect of Pain on Daily Activities difficulty with reaching and lifting tasks              [facility] PT Assessment - 03/23/20 0001      Assessment   Medical Diagnosis Tendinitis of left rotator cuff    Referring Provider (PT) Nche, Charlene Brooke    Onset Date/Surgical Date 02/10/20    Hand Dominance Right    Next MD Visit needs to make f/u    Prior Therapy no      Precautions   Precautions None      Restrictions   Weight Bearing Restrictions No  Balance Screen   Has the patient fallen in the past 6 months No      [place] residence    Living Arrangements Spouse/significant other    Additional Comments stairs to enter      Prior Function   Level of Independence --   husband has to help with reaching top shelves   Vocation Requirements typing, lifting light weight      Cognition   Overall Cognitive Status Within Functional Limits for tasks assessed      Observation/Other Assessments   Focus on Therapeutic Outcomes (FOTO)  45% function to 64% predicted      Sensation   Light Touch Appears Intact      Posture/Postural Control   Posture/Postural Control Postural limitations    Postural Limitations Rounded Shoulders;Forward head      AROM   Overall AROM Comments pain with all AROM in bilateral shoulders; cervical AROM WNL and pain free    Right Shoulder  Flexion 145 Degrees    Right Shoulder ABduction 150 Degrees    Right Shoulder Internal Rotation --   T7   Right Shoulder External Rotation --   base of occiput   Left Shoulder Flexion 130 Degrees    Left Shoulder ABduction 140 Degrees    Left Shoulder Internal Rotation --   T7   Left Shoulder External Rotation --   ear     Strength   Overall Strength Comments pain    Right Shoulder Flexion 4/5    Right Shoulder ABduction 4/5    Right Shoulder Internal Rotation 4+/5    Right Shoulder External Rotation 4+/5    Left Shoulder Flexion 4/5    Left Shoulder ABduction 4/5    Left Shoulder Internal Rotation 4+/5    Left Shoulder External Rotation 4+/5      Palpation   Palpation comment diffuse tenderness about bilateral anterior and lateral shoulders      Special Tests   Other special tests (+) Neers (+) Michel Bickers (-) Drop Arm (-) Painful Arc (+) Empty Can                      Objective measurements completed on examination: See above findings.       [facility] Adult PT Treatment/Exercise - 03/23/20 0001      Self-Care   Self-Care Other Self-Care Comments    Other Self-Care Comments  see patient education                  PT Education - 03/23/20 1733    Education Details Education on current condition, POC, FOTO score and anticipated progress, posture, modalities for pain control, and HEP. Handout provided.    Person(s) Educated Patient    Methods Explanation;Demonstration;Handout;Verbal cues    Comprehension Verbalized understanding;Returned demonstration            PT Short Term Goals - 03/23/20 1736      PT SHORT TERM GOAL #1   Title Patient will be independent and compliant with established HEP.    Baseline Issued at eval    Time 3    Period Weeks    Status New    Target Date 04/13/20      PT SHORT TERM GOAL #2   Title Patient will demonstrate proper scapulothoracic and cervical positioning in sitting to improve overall posture.     Baseline forward head/rounded shoulders    Time 3    Period Weeks  Status New    Target Date 04/13/20      PT SHORT TERM GOAL #3   Title Patient will demonstrate at least 150 degrees of active shoulder flexion bilaterally to improve ability to complete overhead reaching.    Baseline see flowsheet    Time 3    Period Weeks    Status New    Target Date 04/13/20             PT Long Term Goals - 03/23/20 2135      PT LONG TERM GOAL #1   Title Patient will demonstrate full and pain free strength in bilateral shoulders to improve ability to lift and carry objects.    Baseline see flowsheet    Time 6    Period Weeks    Status New    Target Date 05/04/20      PT LONG TERM GOAL #2   Title Patient will demonstrate at least 160 degrees of active shoulder flexion and abduction without pain in order to complete overhead reaching tasks.    Baseline see flowsheet    Time 6    Period Weeks    Status New    Target Date 05/04/20      PT LONG TERM GOAL #3   Title Patient will score 69% function on FOTO to signify clinically meaningful improvement in functional abilities.    Baseline 45% function    Time 6    Period Weeks    Status New    Target Date 05/04/20                  Plan - 03/23/20 1756    Clinical Impression Statement Patient is a 60 y/o female who presents to OPPT with chief complaint of acute bilateral shoulder pain (Lt>Rt) that began about 6 weeks ago without known cause. Her signs and symptoms are consistent with rotator cuff tendinopathy likely due to repetitive use and long-standing postural dysfunction. Overall she has good strength about bilateral shoulders though has pain with all strength testing. She has limited AROM bilaterally most notably into flexion and abduction reporting pain with all AROM. She should benefit from skilled PT to address above stated deficits in order to return to optimal function.    Personal Factors and Comorbidities  Age;Profession;Comorbidity 1    Comorbidities obesity    Examination-Activity Limitations Sleep;Carry;Lift;Reach Overhead    Examination-Participation Restrictions Cleaning;Laundry;Meal Prep;Occupation    Stability/Clinical Decision Making Stable/Uncomplicated    Clinical Decision Making Low    Rehab Potential Good    PT Frequency --   1-2/week   PT Duration 6 weeks    PT Treatment/Interventions ADLs/Self Care Home Management;Cryotherapy;Electrical Stimulation;Iontophoresis 108m/ml Dexamethasone;Moist Heat;Therapeutic activities;Therapeutic exercise;Neuromuscular re-education;Patient/family education;Manual techniques;Passive range of motion;Vasopneumatic Device;Taping;Dry needling    PT Next Visit Plan Review HEP. periscapular strengthening. PROM    PT Home Exercise Plan Access Code [email or web address]   Consulted and Agree with Plan of Care Patient           Patient will benefit from skilled therapeutic intervention in order to improve the following deficits and impairments:  Decreased strength,Pain,Impaired UE functional use,Decreased range of motion,Postural dysfunction  Visit Diagnosis: Acute pain of left shoulder  Acute pain of right shoulder  Stiffness of left shoulder, not elsewhere classified  Stiffness of right shoulder, not elsewhere classified  Abnormal posture  Muscle weakness (generalized)     Problem List Patient Active Problem List   Diagnosis Date Noted  . Depression 08/25/2019  . Vitamin  D deficiency 11/04/2018  . Class 3 severe obesity with serious comorbidity and body mass index (BMI) of 50.0 to 59.9 in adult ([place]) 10/28/2018  . Drug-induced constipation 10/26/2018  . Breast mass, right 12/18/2017  . Chronic bilateral low back pain with right-sided sciatica 10/31/2017  . Spinal stenosis of lumbosacral region 10/31/2017  . DDD (degenerative disc disease), lumbosacral 10/31/2017  . Controlled substance agreement signed 10/31/2017  . Acute right-sided low back  pain with right-sided sciatica 08/11/2017  . Hematochezia   . Benign neoplasm of colon   . Benign neoplasm of transverse colon   . Benign neoplasm of descending colon   . Benign neoplasm of rectum   . Lumbar paraspinal muscle spasm 07/31/2016  . DM (diabetes mellitus) ([place]) 04/29/2016  . Essential hypertension 04/29/2016  . Hyperlipidemia 04/29/2016  . Morbid obesity with BMI of 50.0-59.9, adult ([place]) 04/29/2016   PHYSICAL THERAPY DISCHARGE SUMMARY  Visits from Start of Care: 1  Current functional level related to goals / functional outcomes: See above   Remaining deficits: See above   Education / Equipment: See above  Plan: Patient agrees to discharge.  Patient goals were not met. Patient is being discharged due to being pleased with the current functional level.  ?????    Patient called to cancel remaining appointments, stating that she is no longer having issues with her arm and does not need PT at this time.  Haydee Monica, PT, DPT 04/18/20 6:49 PM   Gwendolyn Grant, PT, DPT, ATC 03/23/20 9:43 PM  [facility] [address] [place], [place], [address] Phone: [phone]   Fax:  [phone]  Name: RAILEIGH SABATER MRN: [id number] Date of Birth: 08-01-1960

## 2020-03-28 ENCOUNTER — Encounter: Payer: Self-pay | Admitting: Nurse Practitioner

## 2020-03-29 ENCOUNTER — Encounter: Payer: Self-pay | Admitting: Nurse Practitioner

## 2020-04-06 ENCOUNTER — Telehealth: Payer: Self-pay

## 2020-04-06 NOTE — Telephone Encounter (Signed)
Pt called to request a refill for  atorvastatin (LIPITOR) 40 MG tablet Be sent to  [facility], [place]

## 2020-04-10 ENCOUNTER — Other Ambulatory Visit: Payer: Self-pay | Admitting: Nurse Practitioner

## 2020-04-10 ENCOUNTER — Telehealth: Payer: Self-pay

## 2020-04-10 DIAGNOSIS — I10 Essential (primary) hypertension: Secondary | ICD-10-CM

## 2020-04-10 DIAGNOSIS — E782 Mixed hyperlipidemia: Secondary | ICD-10-CM

## 2020-04-10 MED ORDER — ATENOLOL 50 MG PO TABS: 50.0000 mg | ORAL_TABLET | Freq: Every day | ORAL | 3 refills | Status: DC

## 2020-04-10 NOTE — Telephone Encounter (Signed)
Rx sent over to pharmacy on 03/03/20 with a quanity of 90 with 3 refills. LVM on pharmacy line to confirm they received Rx.

## 2020-04-10 NOTE — Telephone Encounter (Signed)
Last OV 02/29/2020 Last fill 06/22/19  #90/3

## 2020-04-11 NOTE — Telephone Encounter (Signed)
Pt states pharmacy does not have this medication for her. Pt also states she was only taking medication once a week and wants to confirm that you want her to take TID? Please advise and if possible send into pharmacy. Pt has been out of medication since last week.

## 2020-04-11 NOTE — Telephone Encounter (Signed)
Sent rx. She needs to take med 3x/week

## 2020-04-12 ENCOUNTER — Ambulatory Visit: Payer: Managed Care, Other (non HMO)

## 2020-04-20 ENCOUNTER — Encounter: Payer: Self-pay | Admitting: Nurse Practitioner

## 2020-04-20 ENCOUNTER — Ambulatory Visit: Payer: Managed Care, Other (non HMO)

## 2020-04-20 ENCOUNTER — Telehealth: Payer: Self-pay | Admitting: Nurse Practitioner

## 2020-04-20 DIAGNOSIS — I10 Essential (primary) hypertension: Secondary | ICD-10-CM

## 2020-04-20 MED ORDER — AMLODIPINE BESYLATE 5 MG PO TABS
5.0000 mg | ORAL_TABLET | Freq: Every day | ORAL | 0 refills | Status: DC
Start: 2020-04-20 — End: 2020-05-30

## 2020-04-20 NOTE — Telephone Encounter (Signed)
Pt called and said she needs an updated script for amLODipine (NORVASC) 5 MG tablet sent to [facility] on [address], [place], [place] [address]

## 2020-04-20 NOTE — Telephone Encounter (Signed)
Pt calling again about an updated script for amLODipine (NORVASC) 5 MG tablet sent to [facility] on [address], [place], [place] [address]  She said she hasn't had any medication in about a week and is feeling light headed  Please advise Thanks

## 2020-04-21 ENCOUNTER — Encounter: Payer: Self-pay | Admitting: Gastroenterology

## 2020-04-21 NOTE — Telephone Encounter (Signed)
Patient notified and verbalized understanding. 

## 2020-05-29 ENCOUNTER — Other Ambulatory Visit: Payer: Self-pay

## 2020-05-29 ENCOUNTER — Encounter: Payer: Managed Care, Other (non HMO) | Admitting: Nurse Practitioner

## 2020-05-30 ENCOUNTER — Ambulatory Visit: Payer: Managed Care, Other (non HMO) | Admitting: Nurse Practitioner

## 2020-05-30 ENCOUNTER — Encounter: Payer: Self-pay | Admitting: Nurse Practitioner

## 2020-05-30 VITALS — BP 136/80 | HR 88 | Temp 97.4°F | Ht 65.0 in | Wt 324.4 lb

## 2020-05-30 DIAGNOSIS — E1169 Type 2 diabetes mellitus with other specified complication: Secondary | ICD-10-CM

## 2020-05-30 DIAGNOSIS — E7849 Other hyperlipidemia: Secondary | ICD-10-CM

## 2020-05-30 DIAGNOSIS — I10 Essential (primary) hypertension: Secondary | ICD-10-CM | POA: Diagnosis not present

## 2020-05-30 DIAGNOSIS — M5441 Lumbago with sciatica, right side: Secondary | ICD-10-CM

## 2020-05-30 DIAGNOSIS — G8929 Other chronic pain: Secondary | ICD-10-CM

## 2020-05-30 DIAGNOSIS — M25552 Pain in left hip: Secondary | ICD-10-CM | POA: Diagnosis not present

## 2020-05-30 MED ORDER — ATENOLOL 50 MG PO TABS: 50.0000 mg | ORAL_TABLET | Freq: Every day | ORAL | 3 refills | Status: DC

## 2020-05-30 MED ORDER — AMLODIPINE BESYLATE 5 MG PO TABS
5.0000 mg | ORAL_TABLET | Freq: Every day | ORAL | 3 refills | Status: DC
Start: 2020-05-30 — End: 2021-10-25

## 2020-05-30 NOTE — Assessment & Plan Note (Signed)
Repeat lipid panel Current use of atorvastatin

## 2020-05-30 NOTE — Assessment & Plan Note (Addendum)
BP at goal with amlodipine and atenolol BP Readings from Last 3 Encounters:  05/30/20 136/80  02/29/20 118/82  01/18/20 132/82   Repeat CMP Maintain current dose

## 2020-05-30 NOTE — Assessment & Plan Note (Addendum)
Average fasting glucose in last 90days per glucometer: 147 (76 readings). Current use of glipizide and ozempic with no adverse side effects. No significant weight loss noted but glucose is controlled Advised to schedule annual diabetic eye exam. BP at goal Wt Readings from Last 3 Encounters:  05/30/20 (!) 324 lb 6.4 oz (147.1 kg)  02/29/20 (!) 322 lb 9.6 oz (146.3 kg)  01/18/20 (!) 314 lb (142.4 kg)   Repeat CMP and HgbA1c. Maintain current medications

## 2020-05-30 NOTE — Progress Notes (Signed)
Subjective:  Patient ID: Ruth [name], female    DOB: August 21, 1960  Age: 60 y.o. MRN: [id number]  CC: Follow-up (6 month f/u on HTN and DM. Pt is fasting. /Blood sugars at home range 117-171)  Hip Pain  The incident occurred more than 1 week ago. There was no injury mechanism. The pain is present in the left hip. The quality of the pain is described as aching and shooting. The pain has been constant since onset. Pertinent negatives include no inability to bear weight, loss of motion, loss of sensation, muscle weakness, numbness or tingling. She reports no foreign bodies present. The symptoms are aggravated by movement and weight bearing. She has tried acetaminophen and NSAIDs for the symptoms. The treatment provided no relief.   DM (diabetes mellitus) ([place]) Average fasting glucose in last 90days per glucometer: 147 (76 readings). Current use of glipizide and ozempic with no adverse side effects. No significant weight loss noted but glucose is controlled Advised to schedule annual diabetic eye exam. BP at goal Wt Readings from Last 3 Encounters:  05/30/20 (!) 324 lb 6.4 oz (147.1 kg)  02/29/20 (!) 322 lb 9.6 oz (146.3 kg)  01/18/20 (!) 314 lb (142.4 kg)   Repeat CMP and HgbA1c. Maintain current medications  Essential hypertension BP at goal with amlodipine and atenolol BP Readings from Last 3 Encounters:  05/30/20 136/80  02/29/20 118/82  01/18/20 132/82   Repeat CMP Maintain current dose  Hyperlipidemia Repeat lipid panel Current use of atorvastatin  Wt Readings from Last 3 Encounters:  05/30/20 (!) 324 lb 6.4 oz (147.1 kg)  02/29/20 (!) 322 lb 9.6 oz (146.3 kg)  01/18/20 (!) 314 lb (142.4 kg)   BP Readings from Last 3 Encounters:  05/30/20 136/80  02/29/20 118/82  01/18/20 132/82   Reviewed past Medical, Social and Family history today.  Outpatient Medications Prior to Visit  Medication Sig Dispense Refill  . Accu-Chek Softclix Lancets lancets USE AS DIRECTED TO  TEST BLOOD SUGAR TWICE DAILY(BEFORE BREAKFAST AND AT BEDTIME) 100 each 11  . Ascorbic Acid (VITAMIN C PO) Take 1 tablet by mouth daily.     Marland Kitchen atorvastatin (LIPITOR) 40 MG tablet Take 1 tablet (40 mg total) by mouth 3 (three) times a week. 36 tablet 3  . Blood Glucose Monitoring Suppl (ACCU-CHEK GUIDE ME) w/Device KIT CHECK GLUCOSE AT BREAKFAST AND BEDTIME 1 kit 0  . glipiZIDE (GLUCOTROL XL) 2.5 MG 24 hr tablet Take 1 tablet (2.5 mg total) by mouth daily with breakfast. With heaviest meals 90 tablet 1  . glucose blood test strip Check blood glucose before breakfast and at bedtime. Accu Chek test strips 100 each 12  . Insulin Pen Needle (NOVOFINE) 30G X 8 MM MISC Inject 10 each into the skin as needed. 1 packet 2  . metFORMIN (GLUCOPHAGE) 850 MG tablet Take 1 tablet (850 mg total) by mouth 2 (two) times daily with a meal. (Patient taking differently: Take 850 mg by mouth daily.) 180 tablet 3  . Multiple Vitamins-Minerals (MULTIVITAMIN WITH MINERALS) tablet Take 1 tablet by mouth daily.    . Semaglutide, 1 MG/DOSE, (OZEMPIC, 1 MG/DOSE,) 2 MG/1.5ML SOPN Inject 0.75 mLs (1 mg total) into the skin once a week. 3 mL 5  . Vitamin D, Ergocalciferol, (DRISDOL) 1.25 MG (50000 UNIT) CAPS capsule Take 1 capsule (50,000 Units total) by mouth every 7 (seven) days. 12 capsule 0  . amLODipine (NORVASC) 5 MG tablet Take 1 tablet (5 mg total) by mouth at bedtime.  Need office visit for additional refills 90 tablet 0  . atenolol (TENORMIN) 50 MG tablet Take 1 tablet (50 mg total) by mouth daily. 90 tablet 3  . ibuprofen (ADVIL) 800 MG tablet Take 1 tablet (800 mg total) by mouth every 8 (eight) hours as needed (pain). (Patient not taking: Reported on 05/30/2020) 90 tablet 1  . ondansetron (ZOFRAN) 4 MG tablet Take 1 tablet (4 mg total) by mouth every 8 (eight) hours as needed for nausea or vomiting. (Patient not taking: Reported on 05/30/2020) 20 tablet 0  . traMADol (ULTRAM) 50 MG tablet Take 50 mg by mouth every 6 (six)  hours as needed (pain).  (Patient not taking: No sig reported)    . valACYclovir (VALTREX) 1000 MG tablet Take 1 tablet (1,000 mg total) by mouth 3 (three) times daily. (Patient not taking: No sig reported) 21 tablet 0   No facility-administered medications prior to visit.   ROS See HPI  Objective:  BP 136/80 (BP Location: Right Arm, Patient Position: Sitting, Cuff Size: Large)   Pulse 88   Temp (!) 97.4 F (36.3 C) (Temporal)   Ht 5' 5"  (1.651 m)   Wt (!) 324 lb 6.4 oz (147.1 kg)   SpO2 98%   BMI 53.98 kg/m   Physical Exam Vitals reviewed.  Cardiovascular:     Rate and Rhythm: Normal rate and regular rhythm.     Pulses: Normal pulses.     Heart sounds: Normal heart sounds.  Pulmonary:     Effort: Pulmonary effort is normal.     Breath sounds: Normal breath sounds.  Abdominal:     Tenderness: There is no abdominal tenderness.  Musculoskeletal:        General: Tenderness present.     Right lower leg: No edema.     Left lower leg: No edema.  Neurological:     Mental Status: She is alert and oriented to person, place, and time.  Psychiatric:        Mood and Affect: Mood normal.        Behavior: Behavior normal.    Assessment & Plan:  This visit occurred during the SARS-CoV-2 public health emergency.  Safety protocols were in place, including screening questions prior to the visit, additional usage of staff PPE, and extensive cleaning of exam room while observing appropriate contact time as indicated for disinfecting solutions.   Ruth [name] was seen today for follow-up.  Diagnoses and all orders for this visit:  Essential hypertension -     Cancel: Basic metabolic panel -     Basic metabolic panel  Type 2 diabetes mellitus with other specified complication, without long-term current use of insulin (HCC) -     Cancel: Basic metabolic panel -     Cancel: Hemoglobin A1c -     Basic metabolic panel -     Hemoglobin A1c  Other hyperlipidemia -     Cancel: Lipid panel -      Lipid panel  Chronic left hip pain -     AMB referral to orthopedics  Chronic bilateral low back pain with right-sided sciatica -     AMB referral to orthopedics  HTN (hypertension), benign -     amLODipine (NORVASC) 5 MG tablet; Take 1 tablet (5 mg total) by mouth at bedtime. -     atenolol (TENORMIN) 50 MG tablet; Take 1 tablet (50 mg total) by mouth daily.   Problem List Items Addressed This Visit      Cardiovascular and  Mediastinum   Essential hypertension - Primary    BP at goal with amlodipine and atenolol BP Readings from Last 3 Encounters:  05/30/20 136/80  02/29/20 118/82  01/18/20 132/82   Repeat CMP Maintain current dose      Relevant Medications   amLODipine (NORVASC) 5 MG tablet   atenolol (TENORMIN) 50 MG tablet   Other Relevant Orders   Basic metabolic panel     Endocrine   DM (diabetes mellitus) ([place])    Average fasting glucose in last 90days per glucometer: 147 (76 readings). Current use of glipizide and ozempic with no adverse side effects. No significant weight loss noted but glucose is controlled Advised to schedule annual diabetic eye exam. BP at goal Wt Readings from Last 3 Encounters:  05/30/20 (!) 324 lb 6.4 oz (147.1 kg)  02/29/20 (!) 322 lb 9.6 oz (146.3 kg)  01/18/20 (!) 314 lb (142.4 kg)   Repeat CMP and HgbA1c. Maintain current medications      Relevant Orders   Basic metabolic panel   Hemoglobin A1c     Nervous and Auditory   Chronic bilateral low back pain with right-sided sciatica   Relevant Orders   AMB referral to orthopedics     Other   Chronic left hip pain   Relevant Orders   AMB referral to orthopedics   Hyperlipidemia    Repeat lipid panel Current use of atorvastatin      Relevant Medications   amLODipine (NORVASC) 5 MG tablet   atenolol (TENORMIN) 50 MG tablet   Other Relevant Orders   Lipid panel    Other Visit Diagnoses    HTN (hypertension), benign       Relevant Medications   amLODipine  (NORVASC) 5 MG tablet   atenolol (TENORMIN) 50 MG tablet      Follow-up: Return for maitain upcoming appt fpr CPE.  [name], NP

## 2020-05-30 NOTE — Patient Instructions (Addendum)
Schedule appt for annual eye exam and mammogram [facility] [phone]) Your next colonoscopy is due 10/2026.  Go to lab for blood draw.

## 2020-05-31 LAB — HEMOGLOBIN A1C
Est. average glucose Bld gHb Est-mCnc: 148 mg/dL
Hgb A1c MFr Bld: 6.8 % — ABNORMAL HIGH (ref 4.8–5.6)

## 2020-05-31 LAB — BASIC METABOLIC PANEL
BUN/Creatinine Ratio: 18 (ref 9–23)
BUN: 12 mg/dL (ref 6–24)
CO2: 17 mmol/L — ABNORMAL LOW (ref 20–29)
Calcium: 9.3 mg/dL (ref 8.7–10.2)
Chloride: 108 mmol/L — ABNORMAL HIGH (ref 96–106)
Creatinine, Ser: 0.67 mg/dL (ref 0.57–1.00)
Glucose: 124 mg/dL — ABNORMAL HIGH (ref 65–99)
Potassium: 4.7 mmol/L (ref 3.5–5.2)
Sodium: 145 mmol/L — ABNORMAL HIGH (ref 134–144)
eGFR: 101 mL/min/{1.73_m2} (ref >59)

## 2020-05-31 LAB — LIPID PANEL
Chol/HDL Ratio: 2.5 ratio (ref 0.0–4.4)
Cholesterol, Total: 150 mg/dL (ref 100–199)
HDL: 60 mg/dL (ref >39)
LDL Chol Calc (NIH): 80 mg/dL (ref 0–99)
Triglycerides: 48 mg/dL (ref 0–149)
VLDL Cholesterol Cal: 10 mg/dL (ref 5–40)

## 2020-06-06 ENCOUNTER — Ambulatory Visit: Payer: Managed Care, Other (non HMO) | Admitting: Orthopaedic Surgery

## 2020-06-13 ENCOUNTER — Ambulatory Visit: Payer: Managed Care, Other (non HMO) | Admitting: Orthopaedic Surgery

## 2020-07-12 ENCOUNTER — Encounter: Payer: Managed Care, Other (non HMO) | Admitting: Nurse Practitioner

## 2020-07-13 ENCOUNTER — Ambulatory Visit (INDEPENDENT_AMBULATORY_CARE_PROVIDER_SITE_OTHER): Payer: Managed Care, Other (non HMO) | Admitting: Nurse Practitioner

## 2020-07-13 ENCOUNTER — Other Ambulatory Visit: Payer: Self-pay

## 2020-07-13 ENCOUNTER — Telehealth: Payer: Self-pay

## 2020-07-13 ENCOUNTER — Encounter: Payer: Self-pay | Admitting: Nurse Practitioner

## 2020-07-13 VITALS — BP 136/78 | HR 81 | Temp 97.9°F | Ht 65.5 in | Wt 323.8 lb

## 2020-07-13 DIAGNOSIS — I771 Stricture of artery: Secondary | ICD-10-CM | POA: Insufficient documentation

## 2020-07-13 DIAGNOSIS — Z0001 Encounter for general adult medical examination with abnormal findings: Secondary | ICD-10-CM | POA: Diagnosis not present

## 2020-07-13 DIAGNOSIS — Z6841 Body Mass Index (BMI) 40.0 and over, adult: Secondary | ICD-10-CM

## 2020-07-13 DIAGNOSIS — Z1231 Encounter for screening mammogram for malignant neoplasm of breast: Secondary | ICD-10-CM | POA: Diagnosis not present

## 2020-07-13 MED ORDER — PHENTERMINE HCL 15 MG PO CAPS: 15.0000 mg | ORAL_CAPSULE | ORAL | 1 refills | Status: DC

## 2020-07-13 NOTE — Telephone Encounter (Signed)
PA for Phentermine 15 mg caps submitted to [employer] through cover my meds.  Awaiting response.  Dm/cma  (Key: [id number]

## 2020-07-13 NOTE — Telephone Encounter (Signed)
PA approved form 07/13/20 - 01/12/2021.  Pharmacy notified. VIA phone. Dm/cma

## 2020-07-13 NOTE — Patient Instructions (Addendum)
Schedule appt with nutritionist, ophthalmology and ortho  You will be contacted to schedule appt for mammogram  Start water aerobic exercise.  Hold phentermine rx still echocardiogram is completed Monitor BP once a day. Stop phentermine and Call office if BP>150/90  The Journal of Orthopaedic and Sports Physical Therapy, 44(10), 748. [email or web address]">  How to Increase Your Level of Physical Activity Getting regular physical activity is important for your overall health and well-being. Most people do not get enough exercise. There are easy ways to increase your level of physical activity, even if you have not been very active in the past or if you are just starting out. How can increasing my physical activity affect me? Physical activity has many short-term and long-term benefits. Being active on a regular basis can improve your physical and mental health as well as provide other benefits. Physical health benefits  Helping you lose weight or maintain a healthy weight.  Strengthening your muscles and bones.  Reducing your risk of certain long-term (chronic) diseases, including heart disease, cancer, and diabetes.  Being [place] to move around more easily and for longer periods of time without getting tired (increased stamina).  Improving your ability to fight off illness (enhanced immunity).  Being [place] to sleep better.  Helping you stay healthy as you get older, including: ? Helping you stay mobile, or capable of walking and moving around. ? Preventing accidents, such as falls. ? Increasing life expectancy. Mental health benefits  Boosting your mood and improving your self-esteem.  Lowering your chance of having mental health problems, such as depression or anxiety.  Helping you feel good about your body. Other benefits  Finding new sources of fun and enjoyment.  Meeting new people who share a common interest. What steps can I take to be more  physically active? Getting started  If you have a chronic illness or have not been active for a while, check with your health care provider about how to get started. Ask your health care provider what activities are safe for you.  Start out slowly. Walking or doing some simple chair exercises is a good place to start, especially if you have not been active before or for a long time.  Set goals that you can work toward. Ask your health care provider how much exercise is best for you. In general, most adults should: ? Do moderate-intensity exercise for at least 150 minutes each week (30 minutes on most days of the week) or vigorous exercise for at least 75 minutes each week, or a combination of these.  Moderate-intensity exercise can include walking at a quick pace, biking, yoga, water aerobics, or gardening.  Vigorous exercise involves activities that take more effort, such as jogging or running, playing sports, swimming laps, or jumping rope. ? Do strength exercises on at least 2 days each week. This can include weight lifting, body weight exercises, and resistance-band exercises.  Consider using a fitness tracker, such as a mobile phone app or a device worn like a watch, that will count the number of steps you take each day. Many people strive to reach 10,000 steps a day. Choosing activities  Try to find activities that you enjoy. You are more likely to commit to an exercise routine if it does not feel like a chore.  If you have bone or joint problems, choose low-impact exercises, like walking or swimming.  Use these tips for being successful with an exercise plan: ? Find a workout partner for accountability. ? Join a  group or class, such as an aerobics class, cycling class, or sports team. ? Make family time active. Go for a walk, bike, or swim. ? Include a variety of exercises each week. Being active in your daily routines Besides your formal exercise plans, you can find ways to do  physical activity during your daily routines, such as:  Walking or biking to work or to the store.  Taking the stairs instead of the elevator.  Parking farther away from the door at work or at the store.  Planning walking meetings.  Walking around while you are on the phone.   Where to find more information  Centers for Disease Control and Prevention: WorkDashboard.es  President's Council on Fitness, Sports & Nutrition: www.fitness.gov  ChooseMyPlate: FormerBoss.no Contact a health care provider if:  You have headaches, muscle aches, or joint pain.  You feel dizzy or light-headed while exercising.  You faint.  You have chest pain while exercising. Summary  Exercise benefits your mind and body at any age, even if you are just starting out.  If you have a chronic illness or have not been active for a while, check with your health care provider before increasing your physical activity.  Choose activities that are safe and enjoyable for you. Ask your health care provider what activities are safe for you.  Start slowly. Tell your health care provider if you have problems as you start to increase your activity level. This information is not intended to replace advice given to you by your health care provider. Make sure you discuss any questions you have with your health care provider. Document Revised: 11/02/2018 Document Reviewed: 08/24/2018 Elsevier Patient Education  [place].

## 2020-07-13 NOTE — Progress Notes (Signed)
Subjective:    Patient ID: Ruth [name], female    DOB: [birth year], 60 y.o.   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 No FHx of AAA BP at goal controlled  DM No tobacco use  Ordered echocardiogram to eval for aortic aneurysm.  Morbid obesity with BMI of 50.0-59.9, adult ([place]) Minimal weight loss with ozempic Difficulty with weight bearing exercise due to hip and back pain.  Advised to schedule appt with bariatric clinic, start water aerobic and chair exercises. Start phentermine if normal Echocardiogram Advised about pros and cons of phentermine. She is to monitor BP daily when she starts phentermine. She is to d/c medication and call office if develops chest pain, palpitations or elevated BP. F/up in 86month  Sexual History (orientation,birth control, marital status, STD):married, sexually active, needs repeat mammogram, last PAP completed 08/2017 (normal)  Depression/Suicide[facility] 07/13/2020 05/04/2019 02/10/2019 09/07/2018 08/21/2018 08/22/2017 04/29/2016  Decreased Interest 0 0 0 3 0 0 0  Down, Depressed, Hopeless 0 0 0 2 0 0 0  PHQ - 2 Score 0 0 0 5 0 0 0  Altered sleeping - - - 3 - - -  Tired, decreased energy - - - 3 - - -  Change in appetite - - - 3 - - -  Feeling bad or failure about yourself  - - - 3 - - -  Trouble concentrating - - - 0 - - -  Moving slowly or fidgety/restless - - - 3 - - -  Suicidal thoughts - - - 0 - - -  PHQ-9 Score - - - 20 - - -  Difficult doing work/chores - - - Somewhat difficult - - -   Vision:will schedule  Dental: will schedule  Immunizations: (TDAP, Hep C screen, Pneumovax, Influenza, zoster)  Health Maintenance  Topic Date Due  . Eye exam for diabetics  Never done  . Mammogram  09/17/2019  . COVID-19 Vaccine (3 - Booster for Pfizer  series) 10/18/2019  . Pap Smear  08/22/2020  . Zoster (Shingles) Vaccine (1 of 2) 10/13/2020*  . Flu Shot  09/11/2020  . Urine Protein Check  09/23/2020  . Hemoglobin A1C  11/29/2020  . Complete foot exam   07/13/2021  . Colon Cancer Screening  10/30/2026  . Tetanus Vaccine  10/25/2028  . Pneumococcal vaccine  Completed  . Hepatitis C Screening: USPSTF Recommendation to screen - Ages 163-79yo.  Completed  . HIV Screening  Completed  . HPV Vaccine  Aged Out  *Topic was postponed. The date shown is not the original due date.   Diet:regular Exercise: none Weight:  Wt Readings from Last 3 Encounters:  07/13/20 (!) 323 lb 12.8 oz (146.9 kg)  05/30/20 (!) 324 lb 6.4 oz (147.1 kg)  02/29/20 (!) 322 lb 9.6 oz (146.3 kg)    Fall Risk: Fall Risk  05/04/2019 02/10/2019 08/21/2018 08/22/2017 04/29/2016  Falls in the past year? - 0 0 No No  Number falls in past yr: 0 - - - -  Injury with Fall? 0 - - - -   Medications and allergies reviewed with patient and updated if appropriate.  Patient Active Problem List   Diagnosis Date Noted  . Tortuous aorta ([place] 07/13/2020  . Chronic left hip pain 05/30/2020  . Depression 08/25/2019  .  Vitamin D deficiency 11/04/2018  . Class 3 severe obesity with serious comorbidity and body mass index (BMI) of 50.0 to 59.9 in adult ([place]) 10/28/2018  . Drug-induced constipation 10/26/2018  . Breast mass, right 12/18/2017  . Chronic bilateral low back pain with right-sided sciatica 10/31/2017  . Spinal stenosis of lumbosacral region 10/31/2017  . DDD (degenerative disc disease), lumbosacral 10/31/2017  . Controlled substance agreement signed 10/31/2017  . Acute right-sided low back pain with right-sided sciatica 08/11/2017  . Hematochezia   . Benign neoplasm of colon   . Benign neoplasm of transverse colon   . Benign neoplasm of descending colon   . Benign neoplasm of rectum   . Lumbar paraspinal muscle spasm 07/31/2016  . DM (diabetes mellitus) ([place])  04/29/2016  . Essential hypertension 04/29/2016  . Hyperlipidemia 04/29/2016  . Morbid obesity with BMI of 50.0-59.9, adult ([place]) 04/29/2016    Current Outpatient Medications on File Prior to Visit  Medication Sig Dispense Refill  . Accu-Chek Softclix Lancets lancets USE AS DIRECTED TO TEST BLOOD SUGAR TWICE DAILY(BEFORE BREAKFAST AND AT BEDTIME) 100 each 11  . amLODipine (NORVASC) 5 MG tablet Take 1 tablet (5 mg total) by mouth at bedtime. 90 tablet 3  . atenolol (TENORMIN) 50 MG tablet Take 1 tablet (50 mg total) by mouth daily. 90 tablet 3  . atorvastatin (LIPITOR) 40 MG tablet Take 1 tablet (40 mg total) by mouth 3 (three) times a week. 36 tablet 3  . Blood Glucose Monitoring Suppl (ACCU-CHEK GUIDE ME) w/Device KIT CHECK GLUCOSE AT BREAKFAST AND BEDTIME 1 kit 0  . glipiZIDE (GLUCOTROL XL) 2.5 MG 24 hr tablet Take 1 tablet (2.5 mg total) by mouth daily with breakfast. With heaviest meals 90 tablet 1  . glucose blood test strip Check blood glucose before breakfast and at bedtime. Accu Chek test strips 100 each 12  . Insulin Pen Needle (NOVOFINE) 30G X 8 MM MISC Inject 10 each into the skin as needed. 1 packet 2  . metFORMIN (GLUCOPHAGE) 850 MG tablet Take 1 tablet (850 mg total) by mouth 2 (two) times daily with a meal. (Patient taking differently: Take 850 mg by mouth daily.) 180 tablet 3  . Multiple Vitamins-Minerals (MULTIVITAMIN WITH MINERALS) tablet Take 1 tablet by mouth daily.    . Semaglutide, 1 MG/DOSE, (OZEMPIC, 1 MG/DOSE,) 2 MG/1.5ML SOPN Inject 0.75 mLs (1 mg total) into the skin once a week. 3 mL 5   No current facility-administered medications on file prior to visit.    Past Medical History:  Diagnosis Date  . Anemia   . Arthritis   . Back pain   . Bilateral calcaneal spurs   . Diabetes mellitus without complication ([place])    type 2  . GERD (gastroesophageal reflux disease)   . Gout   . Hyperlipidemia   . Hypertension   . Obesity   . Osteoarthritis   . Palpitations    . Swallowing difficulty   . Vitamin D deficiency     Past Surgical History:  Procedure Laterality Date  . ABDOMINAL HYSTERECTOMY     partial  . CHOLECYSTECTOMY    . COLONOSCOPY WITH PROPOFOL N/A 10/29/2016   Procedure: COLONOSCOPY WITH PROPOFOL;  Surgeon: Yetta Flock, MD;  Location: [facility];  Service: Gastroenterology;  Laterality: N/A;  . DENTAL SURGERY    . ORIF TIBIA & FIBULA FRACTURES Left     Social History   Socioeconomic History  . Marital status: Married    Spouse name: Nadara Mustard  .  Number of children: 3  . Years of education: Not on file  . Highest education level: Not on file  Occupational History  . Occupation: Reference Clerk  Tobacco Use  . Smoking status: Former Smoker    Packs/day: 1.00    Years: 20.00    Pack years: 20.00    Quit date: 02/12/2008    Years since quitting: 12.4  . Smokeless tobacco: Never Used  Vaping Use  . Vaping Use: Never used  Substance and Sexual Activity  . Alcohol use: Yes    Alcohol/week: 1.0 standard drink    Types: 1 Glasses of wine per week  . Drug use: No  . Sexual activity: Yes    Birth control/protection: Surgical, Post-menopausal  Other Topics Concern  . Not on file  Social History Narrative  . Not on file   Social Determinants of Health   Financial Resource Strain: Not on file  Food Insecurity: Not on file  Transportation Needs: Not on file  Physical Activity: Not on file  Stress: Not on file  Social Connections: Not on file    Family History  Problem Relation Age of Onset  . Diabetes Mother   . Stroke Mother   . Dementia Mother   . Hypertension Mother   . Eating disorder Mother   . Obesity Mother   . Pancreatic cancer Father   . Sudden death Father   . Alcoholism Father   . Colon polyps Sister   . Kidney disease Sister   . Cancer Maternal Grandmother        type unknown        Review of Systems  Musculoskeletal: Positive for back pain and joint pain.   Objective:   Vitals:    07/13/20 0942  BP: 136/78  Pulse: 81  Temp: 97.9 F (36.6 C)  SpO2: 98%   Body mass index is 53.06 kg/m.  Physical Examination:  Physical Exam Vitals reviewed.  Constitutional:      General: She is not in acute distress.    Appearance: She is obese.  HENT:     Right Ear: Tympanic membrane, ear canal and external ear normal.     Left Ear: Tympanic membrane, ear canal and external ear normal.  Eyes:     Extraocular Movements: Extraocular movements intact.     Conjunctiva/sclera: Conjunctivae normal.  Cardiovascular:     Rate and Rhythm: Normal rate and regular rhythm.     Pulses: Normal pulses.     Heart sounds: Normal heart sounds.  Pulmonary:     Effort: Pulmonary effort is normal.     Breath sounds: Normal breath sounds.  Chest:  Breasts:     Right: Normal. No axillary adenopathy or supraclavicular adenopathy.     Left: Normal. No axillary adenopathy or supraclavicular adenopathy.    Abdominal:     General: Bowel sounds are normal.     Palpations: Abdomen is soft.  Genitourinary:    Comments: declined Musculoskeletal:     Cervical back: Normal range of motion and neck supple.     Right lower leg: No edema.     Left lower leg: No edema.  Lymphadenopathy:     Upper Body:     Right upper body: No supraclavicular, axillary or pectoral adenopathy.     Left upper body: No supraclavicular, axillary or pectoral adenopathy.  Neurological:     Mental Status: She is alert and oriented to person, place, and time.  Psychiatric:  Mood and Affect: Mood normal.        Behavior: Behavior normal.        Thought Content: Thought content normal.    ASSESSMENT and PLAN: This visit occurred during the SARS-CoV-2 public health emergency.  Safety protocols were in place, including screening questions prior to the visit, additional usage of staff PPE, and extensive cleaning of exam room while observing appropriate contact time as indicated for disinfecting solutions.    Itzamara was seen today for annual exam.  Diagnoses and all orders for this visit:  Encounter for preventative adult health care exam with abnormal findings  Breast cancer screening by mammogram -     MM 3D SCREEN BREAST BILATERAL; Future  Tortuous aorta ([facility]) -     ECHOCARDIOGRAM COMPLETE; Future  Morbid obesity with BMI of 50.0-59.9, adult ([facility]) -     phentermine 15 MG capsule; Take 1 capsule (15 mg total) by mouth every morning.      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 No FHx of AAA BP at goal controlled  DM No tobacco use  Ordered echocardiogram to eval for aortic aneurysm.      Relevant Orders   ECHOCARDIOGRAM COMPLETE     Other   Morbid obesity with BMI of 50.0-59.9, adult ([place])    Minimal weight loss with ozempic Difficulty with weight bearing exercise due to hip and back pain.  Advised to schedule appt with bariatric clinic, start water aerobic and chair exercises. Start phentermine if normal Echocardiogram Advised about pros and cons of phentermine. She is to monitor BP daily when she starts phentermine. She is to d/c medication and call office if develops chest pain, palpitations or elevated BP. F/up in 53month      Relevant Medications   phentermine 15 MG capsule    Other Visit Diagnoses    Encounter for preventative adult health care exam with abnormal findings    -  Primary   Breast cancer screening by mammogram       Relevant Orders   MM 3D SCREEN BREAST BILATERAL      Follow up: Return in about 2 months (around 09/12/2020) for Obesity and HTN (340ms).  ChWilfred LacyNP

## 2020-07-13 NOTE — Assessment & Plan Note (Signed)
Minimal weight loss with ozempic Difficulty with weight bearing exercise due to hip and back pain.  Advised to schedule appt with bariatric clinic, start water aerobic and chair exercises. Start phentermine if normal Echocardiogram Advised about pros and cons of phentermine. She is to monitor BP daily when she starts phentermine. She is to d/c medication and call office if develops chest pain, palpitations or elevated BP. F/up in 10months

## 2020-07-13 NOTE — Assessment & Plan Note (Addendum)
Per CXR report 10/27/2017: There is tortuosity of the descending thoracic aorta No hx of angina or exertional SOB. No FHx of AAA BP at goal controlled  DM No tobacco use  Ordered echocardiogram to eval for aortic aneurysm.

## 2020-07-19 ENCOUNTER — Ambulatory Visit: Payer: Managed Care, Other (non HMO) | Admitting: Orthopaedic Surgery

## 2020-07-19 ENCOUNTER — Ambulatory Visit (INDEPENDENT_AMBULATORY_CARE_PROVIDER_SITE_OTHER): Payer: Managed Care, Other (non HMO)

## 2020-07-19 ENCOUNTER — Encounter: Payer: Self-pay | Admitting: Orthopaedic Surgery

## 2020-07-19 ENCOUNTER — Other Ambulatory Visit: Payer: Self-pay

## 2020-07-19 DIAGNOSIS — M25552 Pain in left hip: Secondary | ICD-10-CM | POA: Diagnosis not present

## 2020-07-19 DIAGNOSIS — M25551 Pain in right hip: Secondary | ICD-10-CM | POA: Diagnosis not present

## 2020-07-19 NOTE — Progress Notes (Signed)
Office Visit Note   Patient: Ruth [name]           Date of Birth: 1960-08-22           MRN: [id number] Visit Date: 07/19/2020              Requested by: Flossie Buffy, NP [place],  [place] [id number] PCP: Flossie Buffy, NP   Assessment & Plan: Visit Diagnoses:  1. Pain in left hip   2. Bilateral hip pain     Plan: Impression is bilateral abductor tendinopathy and right middle finger stenosing tenosynovitis. She has some early degenerative changes of the left hip which could be contributing to her groin pain, however the lateral hip pain is more concerning for her today. We discussed conservative treatment options including an ultrasound guided cortisone injection of the trochanteric bursa and physical therapy to strengthen her gluteal muscles. We also discussed that weight loss will help improve her symptoms and encouraged her to continue her weight loss efforts with her PCP. She would like to proceed with therapy and forego the injections. In regards to her finger we discussed splinting, anti-inflammatories and cortisone injections. She'd like to proceed with splinting and we provided her with one today. We will see her back as needed.   Follow-Up Instructions: Return if symptoms worsen or fail to improve.   Orders:  Orders Placed This Encounter  Procedures  . XR HIPS BILAT W OR W/O PELVIS 3-4 VIEWS  . Ambulatory referral to Physical Therapy   No orders of the defined types were placed in this encounter.     Procedures: No procedures performed   Clinical Data: No additional findings.   Subjective: Chief Complaint  Patient presents with  . Left Hip - Pain  . Right Hip - Pain  . Right Middle Finger - Pain    HPI  Ruth [name] is a 60 year old female with a significant PMH of DM, obesity with BMI of 53 who presents to clinic for evaluation of bilateral hip pain, L>R. Symptoms started 1 month ago without known injury. She has been doing  more walking at her job at [employer]. She describes lateral hip pain when laying on her sides at night and left groin pain with walking and standing. She denies mechanical symptoms. Previous treatment includes [facility] which provides some relief. She also takes Ibuprofen sparingly. She has not had any injections or done any physical therapy for her hip. No previous surgeries. She denies radicular symptoms.   Ruth [name] also reports right middle finger popping and catching that has been occurring for about 6 weeks. She endorses pain in the palm and some numbness and tingling of the entire hand. No previous injections or therapy.   Review of Systems  Review of Systems was reviewed and negative unless as detailed in the HPI.   Objective: Vital Signs: There were no vitals taken for this visit.  Physical Exam  Ortho Exam  Right middle finger exam reveals normal inspection. She is tender over the right palmar hand just proximal to the middle finger MCP joint with palpable catching and popping with finger flexion and extension. Full ROM with finger extension and making a composite fist. Distal neurovascular exam intact.   Right hip exam reveals nonpainful ROM with hip flexion, internal and external rotation. Negative log roll. Tender over the greater trochanter and gluteal insertion. 5/5 hip flexion strength. Distal neurovascular exam intact.   Left hip exam reveals groin  and gluteal pain with hip flexion, internal and external rotation. Negative log roll. Tender over the greater trochanter and gluteal insertion. 5/5 hip flexion strength with pain. Negative straight leg raise. Distal neurovascular exam intact.   Specialty Comments:  No specialty comments available.  Imaging: Radiographs of bilateral hips and pelvis dated 07/19/20 were reviewed today and show mild degenerative changes of the left hip.     PMFS History: Patient Active Problem List   Diagnosis Date Noted  . 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 Chronic left hip pain 05/30/2020  . Depression 08/25/2019  . Vitamin D deficiency 11/04/2018  . Class 3 severe obesity with serious comorbidity and body mass index (BMI) of 50.0 to 59.9 in adult ([place]) 10/28/2018  . Drug-induced constipation 10/26/2018  . Breast mass, right 12/18/2017  . Chronic bilateral low back pain with right-sided sciatica 10/31/2017  . Spinal stenosis of lumbosacral region 10/31/2017  . DDD (degenerative disc disease), lumbosacral 10/31/2017  . Controlled substance agreement signed 10/31/2017  . Acute right-sided low back pain with right-sided sciatica 08/11/2017  . Hematochezia   . Benign neoplasm of colon   . Benign neoplasm of transverse colon   . Benign neoplasm of descending colon   . Benign neoplasm of rectum   . Lumbar paraspinal muscle spasm 07/31/2016  . DM (diabetes mellitus) ([place]) 04/29/2016  . Essential hypertension 04/29/2016  . Hyperlipidemia 04/29/2016  . Morbid obesity with BMI of 50.0-59.9, adult ([place]) 04/29/2016   Past Medical History:  Diagnosis Date  . Anemia   . Arthritis   . Back pain   . Bilateral calcaneal spurs   . Diabetes mellitus without complication ([place])    type 2  . GERD (gastroesophageal reflux disease)   . Gout   . Hyperlipidemia   . Hypertension   . Obesity   . Osteoarthritis   . Palpitations   . Swallowing difficulty   . Vitamin D deficiency     Family History  Problem Relation Age of Onset  . Diabetes Mother   . Stroke Mother   . Dementia Mother   . Hypertension Mother   . Eating disorder Mother   . Obesity Mother   . Pancreatic cancer Father   . Sudden death Father   . Alcoholism Father   . Colon polyps Sister   . Kidney disease Sister   . Cancer Maternal Grandmother        type unknown    Past Surgical History:  Procedure Laterality Date  . ABDOMINAL HYSTERECTOMY     partial  . CHOLECYSTECTOMY    . COLONOSCOPY WITH PROPOFOL N/A 10/29/2016   Procedure: COLONOSCOPY WITH PROPOFOL;  Surgeon:  Yetta Flock, MD;  Location: [facility];  Service: Gastroenterology;  Laterality: N/A;  . DENTAL SURGERY    . ORIF TIBIA & FIBULA FRACTURES Left    Social History   Occupational History  . Occupation: Reference Clerk  Tobacco Use  . Smoking status: Former Smoker    Packs/day: 1.00    Years: 20.00    Pack years: 20.00    Quit date: 02/12/2008    Years since quitting: 12.4  . Smokeless tobacco: Never Used  Vaping Use  . Vaping Use: Never used  Substance and Sexual Activity  . Alcohol use: Yes    Alcohol/week: 1.0 standard drink    Types: 1 Glasses of wine per week  . Drug use: No  . Sexual activity: Yes    Birth control/protection: Surgical, Post-menopausal

## 2020-07-20 ENCOUNTER — Encounter: Payer: Self-pay | Admitting: Nurse Practitioner

## 2020-07-20 ENCOUNTER — Other Ambulatory Visit: Payer: Self-pay

## 2020-07-20 DIAGNOSIS — E782 Mixed hyperlipidemia: Secondary | ICD-10-CM

## 2020-07-20 MED ORDER — ATORVASTATIN CALCIUM 40 MG PO TABS: 40.0000 mg | ORAL_TABLET | ORAL | 3 refills | Status: DC

## 2020-07-22 ENCOUNTER — Ambulatory Visit
Admission: RE | Admit: 2020-07-22 | Discharge: 2020-07-22 | Disposition: A | Discharge status: Home / Self Care | Payer: Managed Care, Other (non HMO) | Source: Ambulatory Visit | Attending: Nurse Practitioner | Admitting: Nurse Practitioner

## 2020-07-22 DIAGNOSIS — Z1231 Encounter for screening mammogram for malignant neoplasm of breast: Secondary | ICD-10-CM

## 2020-07-26 LAB — HM DIABETES EYE EXAM

## 2020-08-07 ENCOUNTER — Encounter: Payer: Self-pay | Admitting: Nurse Practitioner

## 2020-08-08 ENCOUNTER — Other Ambulatory Visit: Payer: Self-pay | Admitting: Nurse Practitioner

## 2020-08-08 DIAGNOSIS — E1165 Type 2 diabetes mellitus with hyperglycemia: Secondary | ICD-10-CM

## 2020-08-09 ENCOUNTER — Telehealth: Payer: Self-pay

## 2020-08-09 ENCOUNTER — Other Ambulatory Visit (HOSPITAL_COMMUNITY): Payer: Managed Care, Other (non HMO)

## 2020-08-09 NOTE — Telephone Encounter (Signed)
previously approved on [id number] from 08/08/20 to 06/08/21

## 2020-08-09 NOTE — Telephone Encounter (Signed)
PA for Ozempic submitted through cover my meds.  Awaiting response. Dm/cma  (Key: [id number]

## 2020-08-10 ENCOUNTER — Other Ambulatory Visit: Payer: Self-pay

## 2020-08-10 ENCOUNTER — Ambulatory Visit (HOSPITAL_COMMUNITY): Payer: Managed Care, Other (non HMO) | Attending: Internal Medicine

## 2020-08-10 DIAGNOSIS — E119 Type 2 diabetes mellitus without complications: Secondary | ICD-10-CM | POA: Diagnosis not present

## 2020-08-10 DIAGNOSIS — G8929 Other chronic pain: Secondary | ICD-10-CM | POA: Diagnosis not present

## 2020-08-10 DIAGNOSIS — I517 Cardiomegaly: Secondary | ICD-10-CM

## 2020-08-10 DIAGNOSIS — M549 Dorsalgia, unspecified: Secondary | ICD-10-CM | POA: Diagnosis not present

## 2020-08-10 DIAGNOSIS — I10 Essential (primary) hypertension: Secondary | ICD-10-CM | POA: Insufficient documentation

## 2020-08-10 DIAGNOSIS — E785 Hyperlipidemia, unspecified: Secondary | ICD-10-CM | POA: Diagnosis not present

## 2020-08-10 DIAGNOSIS — I771 Stricture of artery: Secondary | ICD-10-CM | POA: Insufficient documentation

## 2020-08-10 DIAGNOSIS — M25552 Pain in left hip: Secondary | ICD-10-CM | POA: Insufficient documentation

## 2020-08-10 DIAGNOSIS — E65 Localized adiposity: Secondary | ICD-10-CM

## 2020-08-10 LAB — ECHOCARDIOGRAM COMPLETE
Area-P 1/2: 3.91 cm2
S' Lateral: 2.6 cm

## 2020-08-10 MED ORDER — PERFLUTREN LIPID MICROSPHERE
1.0000 mL | INTRAVENOUS | Status: AC | PRN
  Administered 2020-08-10: 3 mL via INTRAVENOUS

## 2020-08-29 ENCOUNTER — Ambulatory Visit: Payer: Managed Care, Other (non HMO) | Attending: Orthopaedic Surgery

## 2020-08-29 ENCOUNTER — Other Ambulatory Visit: Payer: Self-pay

## 2020-08-29 DIAGNOSIS — R2689 Other abnormalities of gait and mobility: Secondary | ICD-10-CM | POA: Diagnosis present

## 2020-08-29 DIAGNOSIS — M25551 Pain in right hip: Secondary | ICD-10-CM | POA: Diagnosis present

## 2020-08-29 DIAGNOSIS — R293 Abnormal posture: Secondary | ICD-10-CM | POA: Diagnosis present

## 2020-08-29 DIAGNOSIS — G8929 Other chronic pain: Secondary | ICD-10-CM | POA: Diagnosis present

## 2020-08-29 DIAGNOSIS — M545 Low back pain, unspecified: Secondary | ICD-10-CM | POA: Insufficient documentation

## 2020-08-29 DIAGNOSIS — M6281 Muscle weakness (generalized): Secondary | ICD-10-CM | POA: Diagnosis present

## 2020-08-29 DIAGNOSIS — M25552 Pain in left hip: Secondary | ICD-10-CM | POA: Diagnosis present

## 2020-08-29 NOTE — Patient Instructions (Signed)
Access Code: [email or web address] URL: [email or web address] Date: 08/29/2020 Prepared by: Sherlynn Stalls  Exercises Seated Hip Abduction with Resistance - 1 x daily - 7 x weekly - 3 sets - 10 reps Standing March with Counter Support - 1 x daily - 7 x weekly - 2 sets - 10 reps Standing Hip Extension with Counter Support - 1 x daily - 7 x weekly - 5 sets - 5 reps Heat - 1 x daily - 7 x weekly - 10-15 minutes hold

## 2020-08-29 NOTE — Therapy (Signed)
[place]. [place], [place], [address] Phone: [phone]   Fax:  [phone]  Physical Therapy Evaluation  Patient Details  Name: Ruth [name] MRN: [id number] Date of Birth: 03-Aug-1960 Referring Provider (PT): Erlinda Hong   Encounter Date: 08/29/2020   PT End of Session - 08/29/20 1857     Visit Number 1    Date for PT Re-Evaluation 10/31/20    PT Start Time 1815    PT Stop Time 1850    PT Time Calculation (min) 35 min    Activity Tolerance Patient tolerated treatment well;Patient limited by pain    Behavior During Therapy [facility] for tasks assessed/performed             Past Medical History:  Diagnosis Date   Anemia    Arthritis    Back pain    Bilateral calcaneal spurs    Diabetes mellitus without complication ([place])    type 2   GERD (gastroesophageal reflux disease)    Gout    Hyperlipidemia    Hypertension    Obesity    Osteoarthritis    Palpitations    Swallowing difficulty    Vitamin D deficiency     Past Surgical History:  Procedure Laterality Date   ABDOMINAL HYSTERECTOMY     partial   CHOLECYSTECTOMY     COLONOSCOPY WITH PROPOFOL N/A 10/29/2016   Procedure: COLONOSCOPY WITH PROPOFOL;  Surgeon: Yetta Flock, MD;  Location: [facility];  Service: Gastroenterology;  Laterality: N/A;   DENTAL SURGERY     ORIF TIBIA & FIBULA FRACTURES Left     There were no vitals filed for this visit.    Subjective Assessment - 08/29/20 1817     Subjective Lt hip pain began a while ago, less than 6 months ago. Began out of nowhere, gets very achy when laying down. Also noted right hip would get achy as well around the same time. Feels like it has gotten a bit better, some more comfort but still difficult.  Takes a moment to start moving after beign stationary for a while.    Pertinent History hx of LBP. HTN, T2DM, had a recent echocardiogram, surgery on foot september 2020    How long can you stand  comfortably? cant stand too long    How long can you walk comfortably? can be painful    Patient Stated Goals less pain    Currently in Pain? Yes    Pain Score 8     Pain Location Hip    Pain Orientation Left;Right    Pain Descriptors / Indicators Aching    Pain Type Acute pain    Pain Radiating Towards towards the knee, bilateral    Aggravating Factors  laying on the side    Pain Relieving Factors creams                [facility] PT Assessment - 08/29/20 0001       Assessment   Medical Diagnosis M25.552 (ICD-10-CM) - Pain in left hip  M25.551,M25.552 (ICD-10-CM) - Bilateral hip pain    Referring Provider (PT) Xu    Hand Dominance Right    Prior Therapy for low back a long time ago      Balance Screen   Has the patient fallen in the past 6 months No    Has the patient had a decrease in activity level because of a fear of falling?  No    Is the patient  reluctant to leave their home because of a fear of falling?  No      Home Environment   Additional Comments a few stairs to enter front with railing, about 8 in the back.  1 level inside.  house with husband.      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 with increased achiness on the left more than right      Strength   Overall Strength Comments B hips and core grossly 3/5. Knees 4/5 B      Palpation   Palpation comment some TTP B Lumbar into lateral gluts, thighs.      Transfers   Five time sit to stand comments  19 seconds, increased LBP with some hip pain, lists to the right, porr control      Ambulation/Gait   Stairs --   step to with B HR to descend 6" steps, B HR reciprocal to ascend stairs   Gait Comments  excessive lateral trunk lean, trendelenberg, short step length and diminished stance time B      Standardized Balance Assessment   Standardized Balance Assessment Timed Up and Go Test      Timed Up and Go Test   Normal TUG (seconds) 13                        Objective measurements completed on examination: See above findings.               PT Education - 08/29/20 1856     Education Details PT initial POC and HEP. Access Code: [email or web address]  Difficulty and incr discomfort with standing hip abd so this was modified to sitting with band resistance orange for home.    Person(s) Educated Patient    Methods Explanation;Demonstration;Handout;Tactile cues;Verbal cues    Comprehension Verbalized understanding;Returned demonstration              PT Short Term Goals - 08/29/20 1904       PT SHORT TERM GOAL #1   Title Patient will be independent and compliant with initial HEP.    Time 2    Period Weeks    Status New    Target Date 09/12/20               PT Long Term Goals - 08/29/20 1905       PT LONG TERM GOAL #1   Title Independent with advanced HEP    Time 8    Period Weeks    Status New    Target Date 10/31/20      PT LONG TERM GOAL #2   Title Improved BLE strength to at least 4+/5 with </= 2/10 pain.    Time 8    Period Weeks    Status New    Target Date 10/31/20      PT LONG TERM GOAL #3   Title Pt will score 5TSTS in </= 10 seconds with good control and symmetry,minimal pain to demo improved functional sstrength/balance    Time 8    Period Weeks    Status New  Target Date 10/31/20      PT LONG TERM GOAL #4   Title Pt will be [place] to negotiate stairs reciprocally with no greater than 1 HR and good control to facilitate safety when getting into/out of home    Target Date 10/31/20      PT LONG TERM GOAL #5   Title Pt willreport 50% less pain/achiness of hips with improved function    Target Date 10/31/20                     Plan - 08/29/20 1858     Clinical Impression Statement Pt is a kind 60 yo female who presents with history of B hip pain (L > R) in addition to some LBP.She currently presents with diminished core and proximal hip strength, decreased proximal hip flexibility and AROM, and abnormal gait. Given pain and impairments, single leg stance is very limited and contributes to abnormal gait on level surfaces and with stairs. As a result she gets alot of pain with trying to move after  being stationary for a prolonged time, and has difficulty with wlaking and stairs to get in/out of house needing UE support for safety. She will beneift from skilled PT to address aforementioned impairments, improve strength mobility and decrease hip pain    Stability/Clinical Decision Making Stable/Uncomplicated    Clinical Decision Making Low    Rehab Potential Good    PT Frequency 1x / week    PT Duration 8 weeks    PT Treatment/Interventions ADLs/Self Care Home Management;Cryotherapy;Electrical Stimulation;Iontophoresis 4mg /ml Dexamethasone;Moist Heat;Gait training;Functional mobility training;Therapeutic activities;Therapeutic exercise;Balance training;Neuromuscular re-education;Manual techniques;Patient/family education;Energy conservation    PT Next Visit Plan Reassess HEP. Core and LE strength/flexiblity as tolerated. Manual and modlaities as needed.    PT Home Exercise Plan see pt instructions    Consulted and Agree with Plan of Care Patient             Patient will benefit from skilled therapeutic intervention in order to improve the following deficits and impairments:  Abnormal gait, Difficulty walking, Decreased range of motion, Pain, Impaired flexibility, Decreased balance, Decreased mobility, Decreased strength, Decreased endurance  Visit Diagnosis: Muscle weakness (generalized)  Abnormal posture  Pain in left hip  Pain in right hip  Chronic low back pain, unspecified back pain  laterality, unspecified whether sciatica present  Other abnormalities of gait and mobility     Problem List Patient Active Problem List   Diagnosis Date Noted   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   Chronic left hip pain 05/30/2020   Depression 08/25/2019   Vitamin D deficiency 11/04/2018   Class 3 severe obesity with serious comorbidity and body mass index (BMI) of 50.0 to 59.9 in adult ([place]) 10/28/2018   Drug-induced constipation 10/26/2018   Breast mass, right 12/18/2017   Chronic bilateral low back pain with right-sided sciatica 10/31/2017   Spinal stenosis of lumbosacral region 10/31/2017   DDD (degenerative disc disease), lumbosacral 10/31/2017   Controlled substance agreement signed 10/31/2017   Acute right-sided low back pain with right-sided sciatica 08/11/2017   Hematochezia    Benign neoplasm of colon    Benign neoplasm of transverse colon    Benign neoplasm of descending colon    Benign neoplasm of rectum    Lumbar paraspinal muscle spasm 07/31/2016   DM (diabetes mellitus) ([place]) 04/29/2016   Essential hypertension 04/29/2016   Hyperlipidemia 04/29/2016   Morbid obesity with BMI of 50.0-59.9, adult ([place]) 04/29/2016    Carmyn Hamm L Lawrencia Mauney,  PT, DPT 08/29/2020, 7:10 PM  [place]. [place], [place], [address] Phone: [phone]   Fax:  [phone]  Name: Ruth [name] MRN: [id number] Date of Birth: [birth year]

## 2020-08-31 ENCOUNTER — Encounter: Payer: Self-pay | Admitting: Nurse Practitioner

## 2020-08-31 ENCOUNTER — Telehealth: Payer: Self-pay | Admitting: Nurse Practitioner

## 2020-08-31 NOTE — Telephone Encounter (Signed)
Per echocardiogram: normal aorta and heart function. Start phentermine rx as prescribed. It was sent 07/13/2020. She should schedule 27month f/up appt after starting medication. Disability form in relation to chronic back and hip pain should be addressed by ortho.

## 2020-09-02 ENCOUNTER — Other Ambulatory Visit: Payer: Self-pay | Admitting: Nurse Practitioner

## 2020-09-02 DIAGNOSIS — E1169 Type 2 diabetes mellitus with other specified complication: Secondary | ICD-10-CM

## 2020-09-04 ENCOUNTER — Encounter: Payer: Self-pay | Admitting: Nurse Practitioner

## 2020-09-04 NOTE — Telephone Encounter (Signed)
Patient notified and verbalized understanding. 

## 2020-09-12 ENCOUNTER — Ambulatory Visit: Payer: Managed Care, Other (non HMO) | Attending: Orthopaedic Surgery

## 2020-09-12 ENCOUNTER — Other Ambulatory Visit: Payer: Self-pay

## 2020-09-12 DIAGNOSIS — R293 Abnormal posture: Secondary | ICD-10-CM | POA: Insufficient documentation

## 2020-09-12 DIAGNOSIS — R2689 Other abnormalities of gait and mobility: Secondary | ICD-10-CM | POA: Diagnosis present

## 2020-09-12 DIAGNOSIS — M545 Low back pain, unspecified: Secondary | ICD-10-CM | POA: Diagnosis present

## 2020-09-12 DIAGNOSIS — M6281 Muscle weakness (generalized): Secondary | ICD-10-CM | POA: Diagnosis not present

## 2020-09-12 DIAGNOSIS — M25551 Pain in right hip: Secondary | ICD-10-CM | POA: Diagnosis present

## 2020-09-12 DIAGNOSIS — M25552 Pain in left hip: Secondary | ICD-10-CM | POA: Diagnosis present

## 2020-09-12 DIAGNOSIS — G8929 Other chronic pain: Secondary | ICD-10-CM | POA: Diagnosis present

## 2020-09-12 NOTE — Therapy (Signed)
[place]. [place], [place], [address] Phone: [phone]   Fax:  [phone]  Physical Therapy Treatment  Patient Details  Name: Ruth [name] MRN: [id number] Date of Birth: 1960/10/11 Referring Provider (PT): Erlinda Hong   Encounter Date: 09/12/2020   PT End of Session - 09/12/20 1819     Visit Number 2    Date for PT Re-Evaluation 10/31/20    PT Start Time 1813    PT Stop Time 1853    PT Time Calculation (min) 40 min    Activity Tolerance Patient tolerated treatment well;Patient limited by pain    Behavior During Therapy [facility] for tasks assessed/performed             Past Medical History:  Diagnosis Date   Anemia    Arthritis    Back pain    Bilateral calcaneal spurs    Diabetes mellitus without complication ([place])    type 2   GERD (gastroesophageal reflux disease)    Gout    Hyperlipidemia    Hypertension    Obesity    Osteoarthritis    Palpitations    Swallowing difficulty    Vitamin D deficiency     Past Surgical History:  Procedure Laterality Date   ABDOMINAL HYSTERECTOMY     partial   CHOLECYSTECTOMY     COLONOSCOPY WITH PROPOFOL N/A 10/29/2016   Procedure: COLONOSCOPY WITH PROPOFOL;  Surgeon: Yetta Flock, MD;  [facility];  Service: Gastroenterology;  Laterality: N/A;   DENTAL SURGERY     ORIF TIBIA & FIBULA FRACTURES Left     There were no vitals filed for this visit.   Subjective Assessment - 09/12/20 1815     Subjective back pain about a 5/10 today, both hips but more the left is achy about a 6/10    Pertinent History hx of LBP. HTN, T2DM, had a recent echocardiogram, surgery on foot september 2020    How long can you stand comfortably? cant stand too long    How long can you walk comfortably? can be painful    Patient Stated Goals less pain    Currently in Pain? Yes    Pain Score 6     Pain Location Hip    Pain Orientation Right;Left                                [facility] Adult PT Treatment/Exercise - 09/12/20 0001       Exercises   Exercises Lumbar;Knee/Hip      Lumbar Exercises: Stretches   Passive Hamstring Stretch Right;Left;2 reps;20 seconds    ITB Stretch Right;Left;2 reps;20 seconds      Lumbar Exercises: Aerobic   Nustep L3 x 6 minutes      Lumbar Exercises: Standing   Other Standing Lumbar Exercises step taps 4" BHR 2 x 10 alt B      Lumbar Exercises: Seated   Other Seated Lumbar Exercises pball rollouts blue x 10 forward. x 10 L/R using rolling scooter/seat      Lumbar Exercises: Supine   Heel Slides 15 reps   bilateral   Other Supine Lumbar Exercises supine DKTC with ball x 15, obliques with ball x 15 B, small bridge with ball x 15      Knee/Hip Exercises: Standing   Hip Flexion Both;2 sets;10 reps    Hip Extension Both;2 sets;10 reps    Other  Standing Knee Exercises sidestepping EOM x 8 laps      Knee/Hip Exercises: Seated   Ball Squeeze 2 x 10    Marching Strengthening;Both;2 sets;10 reps   red TB   Abduction/Adduction  Strengthening;Both;2 sets;15 reps   red TB                     PT Short Term Goals - 08/29/20 1904       PT SHORT TERM GOAL #1   Title Patient will be independent and compliant with initial HEP.    Time 2    Period Weeks    Status New    Target Date 09/12/20               PT Long Term Goals - 08/29/20 1905       PT LONG TERM GOAL #1   Title Independent with advanced HEP    Time 8    Period Weeks    Status New    Target Date 10/31/20      PT LONG TERM GOAL #2   Title Improved BLE strength to at least 4+/5 with </= 2/10 pain.    Time 8    Period Weeks    Status New    Target Date 10/31/20      PT LONG TERM GOAL #3   Title Pt will score 5TSTS in </= 10 seconds with good control and symmetry,minimal pain to demo improved functional sstrength/balance    Time 8    Period Weeks    Status New    Target Date 10/31/20      PT LONG  TERM GOAL #4   Title Pt will be [place] to negotiate stairs reciprocally with no greater than 1 HR and good control to facilitate safety when getting into/out of home    Target Date 10/31/20      PT LONG TERM GOAL #5   Title Pt willreport 50% less pain/achiness of hips with improved function    Target Date 10/31/20                   Plan - 09/12/20 1848     Clinical Impression Statement Pt reports she has not done HEP, we spent start of session revieiwing this together. Iniitated strength and mobiity exercises today. weak in abduction and in hamstrings (frequent knee hyperext)  especially functionally and will beneift from further strengthening. Did not report increased back or hip pain during session. Reassess tolerance and progress as appropriate next visit.    Stability/Clinical Decision Making Stable/Uncomplicated    Rehab Potential Good    PT Frequency 1x / week    PT Duration 8 weeks    PT Treatment/Interventions ADLs/Self Care Home Management;Cryotherapy;Electrical Stimulation;Iontophoresis '4mg'$ /ml Dexamethasone;Moist Heat;Gait training;Functional mobility training;Therapeutic activities;Therapeutic exercise;Balance training;Neuromuscular re-education;Manual techniques;Patient/family education;Energy conservation    PT Next Visit Plan Reassess HEP. Core and LE strength/flexiblity as tolerated. Manual and modlaities as needed.    PT Home Exercise Plan see pt instructions    Consulted and Agree with Plan of Care Patient             Patient will benefit from skilled therapeutic intervention in order to improve the following deficits and impairments:  Abnormal gait, Difficulty walking, Decreased range of motion, Pain, Impaired flexibility, Decreased balance, Decreased mobility, Decreased strength, Decreased endurance  Visit Diagnosis: Muscle weakness (generalized)  Abnormal posture  Pain in left hip  Pain in right hip  Chronic low back pain, unspecified back  pain  laterality, unspecified whether sciatica present  Other abnormalities of gait and mobility     Problem List Patient Active Problem List   Diagnosis Date Noted   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   Chronic left hip pain 05/30/2020   Depression 08/25/2019   Vitamin D deficiency 11/04/2018   Class 3 severe obesity with serious comorbidity and body mass index (BMI) of 50.0 to 59.9 in adult [facility]) 10/28/2018   Drug-induced constipation 10/26/2018   Breast mass, right 12/18/2017   Chronic bilateral low back pain with right-sided sciatica 10/31/2017   Spinal stenosis of lumbosacral region 10/31/2017   DDD (degenerative disc disease), lumbosacral 10/31/2017   Controlled substance agreement signed 10/31/2017   Acute right-sided low back pain with right-sided sciatica 08/11/2017   Hematochezia    Benign neoplasm of colon    Benign neoplasm of transverse colon    Benign neoplasm of descending colon    Benign neoplasm of rectum    Lumbar paraspinal muscle spasm 07/31/2016   DM (diabetes mellitus) ([place]) 04/29/2016   Essential hypertension 04/29/2016   Hyperlipidemia 04/29/2016   Morbid obesity with BMI of 50.0-59.9, adult ([place]) 04/29/2016    Hall Busing, PT, DPT 09/12/2020, 6:57 PM  [place]. [place], [place], [address] Phone: [phone]   Fax:  [phone]  Name: Ruth [name] MRN: [id number] Date of Birth: 1960-05-21

## 2020-09-18 ENCOUNTER — Encounter: Payer: Self-pay | Admitting: Nurse Practitioner

## 2020-09-20 ENCOUNTER — Ambulatory Visit: Payer: Managed Care, Other (non HMO)

## 2020-09-20 ENCOUNTER — Other Ambulatory Visit: Payer: Self-pay

## 2020-09-20 DIAGNOSIS — R293 Abnormal posture: Secondary | ICD-10-CM

## 2020-09-20 DIAGNOSIS — M25552 Pain in left hip: Secondary | ICD-10-CM

## 2020-09-20 DIAGNOSIS — R2689 Other abnormalities of gait and mobility: Secondary | ICD-10-CM

## 2020-09-20 DIAGNOSIS — G8929 Other chronic pain: Secondary | ICD-10-CM

## 2020-09-20 DIAGNOSIS — M25551 Pain in right hip: Secondary | ICD-10-CM

## 2020-09-20 DIAGNOSIS — M6281 Muscle weakness (generalized): Secondary | ICD-10-CM

## 2020-09-20 NOTE — Therapy (Signed)
[place]. [place], [place], [address] Phone: [phone]   Fax:  [phone]  Physical Therapy Treatment  Patient Details  Name: Ruth [name] MRN: [id number] Date of Birth: 1960/12/28 Referring Provider (PT): Erlinda Hong   Encounter Date: 09/20/2020   PT End of Session - 09/20/20 1823     Visit Number 3    Date for PT Re-Evaluation 10/31/20    PT Start Time 1819    PT Stop Time 1857    PT Time Calculation (min) 38 min    Activity Tolerance Patient tolerated treatment well;Patient limited by pain    Behavior During Therapy [facility] for tasks assessed/performed             Past Medical History:  Diagnosis Date   Anemia    Arthritis    Back pain    Bilateral calcaneal spurs    Diabetes mellitus without complication ([place])    type 2   GERD (gastroesophageal reflux disease)    Gout    Hyperlipidemia    Hypertension    Obesity    Osteoarthritis    Palpitations    Swallowing difficulty    Vitamin D deficiency     Past Surgical History:  Procedure Laterality Date   ABDOMINAL HYSTERECTOMY     partial   CHOLECYSTECTOMY     COLONOSCOPY WITH PROPOFOL N/A 10/29/2016   Procedure: COLONOSCOPY WITH PROPOFOL;  Surgeon: Yetta Flock, MD;  [facility];  Service: Gastroenterology;  Laterality: N/A;   DENTAL SURGERY     ORIF TIBIA & FIBULA FRACTURES Left     There were no vitals filed for this visit.   Subjective Assessment - 09/20/20 1821     Subjective More right sided back pain today    Pertinent History hx of LBP. HTN, T2DM, had a recent echocardiogram, surgery on foot september 2020    How long can you stand comfortably? cant stand too long    How long can you walk comfortably? can be painful    Patient Stated Goals less pain    Currently in Pain? Yes    Pain Score 9    when it catches   Pain Location Back                               [facility] Adult PT Treatment/Exercise  - 09/20/20 0001       Lumbar Exercises: Stretches   Lower Trunk Rotation --   10 reps     Lumbar Exercises: Aerobic   Nustep L4 x 6 min      Lumbar Exercises: Standing   Heel Raises 10 reps   2 sets   Other Standing Lumbar Exercises step taps 4" BHR 2 x 10 alt B    Other Standing Lumbar Exercises hip ABD with lateral toe taps 10 x 2 alt B      Lumbar Exercises: Seated   Other Seated Lumbar Exercises pball rollouts blue x 10 forward. x 10 L/R using rolling scooter/seat    Other Seated Lumbar Exercises ball squeeze 2 x 10      Lumbar Exercises: Supine   Pelvic Tilt 10 reps   2 sets   Heel Slides 15 reps   B   Bridge 15 reps   2 sets   Straight Leg Raise 10 reps   B   Other Supine Lumbar Exercises supine DKTC with ball x 15,  obliques with ball x 15 B, small bridge with ball x 15      Knee/Hip Exercises: Standing   Hip Extension Both;2 sets;10 reps      Knee/Hip Exercises: Seated   Marching Strengthening;Both;2 sets;10 reps   red TB   Hamstring Curl Strengthening;Both;2 sets;10 reps   red   Abduction/Adduction  Strengthening;Both;2 sets;15 reps   red TB                     PT Short Term Goals - 08/29/20 1904       PT SHORT TERM GOAL #1   Title Patient will be independent and compliant with initial HEP.    Time 2    Period Weeks    Status New    Target Date 09/12/20               PT Long Term Goals - 08/29/20 1905       PT LONG TERM GOAL #1   Title Independent with advanced HEP    Time 8    Period Weeks    Status New    Target Date 10/31/20      PT LONG TERM GOAL #2   Title Improved BLE strength to at least 4+/5 with </= 2/10 pain.    Time 8    Period Weeks    Status New    Target Date 10/31/20      PT LONG TERM GOAL #3   Title Pt will score 5TSTS in </= 10 seconds with good control and symmetry,minimal pain to demo improved functional sstrength/balance    Time 8    Period Weeks    Status New    Target Date 10/31/20      PT LONG TERM  GOAL #4   Title Pt will be [place] to negotiate stairs reciprocally with no greater than 1 HR and good control to facilitate safety when getting into/out of home    Target Date 10/31/20      PT LONG TERM GOAL #5   Title Pt willreport 50% less pain/achiness of hips with improved function    Target Date 10/31/20                   Plan - 09/20/20 1823     Clinical Impression Statement Reports doing a bit more of home exercises but still not frequent. Does report sid eof leg is not aching as much which is good. Required cues for HS control/eccentric with HS curls in sitting with TB, shaky quality of motion noted difficult to control and will benefit from further strengthening as she does still frequently hyperextend in stance - likely HS weakness is contributing to this instability. Continue to work on core stab and proximal hip strenght posterior chain strength and stability to improve hip and LB    Stability/Clinical Decision Making Stable/Uncomplicated    Rehab Potential Good    PT Frequency 1x / week    PT Duration 8 weeks    PT Treatment/Interventions ADLs/Self Care Home Management;Cryotherapy;Electrical Stimulation;Iontophoresis '4mg'$ /ml Dexamethasone;Moist Heat;Gait training;Functional mobility training;Therapeutic activities;Therapeutic exercise;Balance training;Neuromuscular re-education;Manual techniques;Patient/family education;Energy conservation    PT Next Visit Plan Reassess HEP. Core and LE strength/flexiblity as tolerated. Manual and modlaities as needed.    PT Home Exercise Plan see pt instructions    Consulted and Agree with Plan of Care Patient             Patient will benefit from skilled therapeutic intervention in order to  improve the following deficits and impairments:  Abnormal gait, Difficulty walking, Decreased range of motion, Pain, Impaired flexibility, Decreased balance, Decreased mobility, Decreased strength, Decreased endurance  Visit Diagnosis: Muscle  weakness (generalized)  Abnormal posture  Pain in left hip  Pain in right hip  Chronic low back pain, unspecified back pain laterality, unspecified whether sciatica present  Other abnormalities of gait and mobility     Problem List Patient Active Problem List   Diagnosis Date Noted   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   Chronic left hip pain 05/30/2020   Depression 08/25/2019   Vitamin D deficiency 11/04/2018   Class 3 severe obesity with serious comorbidity and body mass index (BMI) of 50.0 to 59.9 in adult ([place]) 10/28/2018   Drug-induced constipation 10/26/2018   Breast mass, right 12/18/2017   Chronic bilateral low back pain with right-sided sciatica 10/31/2017   Spinal stenosis of lumbosacral region 10/31/2017   DDD (degenerative disc disease), lumbosacral 10/31/2017   Controlled substance agreement signed 10/31/2017   Acute right-sided low back pain with right-sided sciatica 08/11/2017   Hematochezia    Benign neoplasm of colon    Benign neoplasm of transverse colon    Benign neoplasm of descending colon    Benign neoplasm of rectum    Lumbar paraspinal muscle spasm 07/31/2016   DM (diabetes mellitus) ([place]) 04/29/2016   Essential hypertension 04/29/2016   Hyperlipidemia 04/29/2016   Morbid obesity with BMI of 50.0-59.9, adult ([place]) 04/29/2016    Hall Busing, PT, DPT 09/20/2020, 7:00 PM  [place]. [place], [place], [address] Phone: [phone]   Fax:  [phone]  Name: Ruth [name] MRN: [id number] Date of Birth: 04/25/1960

## 2020-09-25 ENCOUNTER — Encounter: Payer: Self-pay | Admitting: Nurse Practitioner

## 2020-09-26 ENCOUNTER — Ambulatory Visit: Payer: Managed Care, Other (non HMO)

## 2020-10-02 ENCOUNTER — Encounter: Payer: Self-pay | Admitting: Nurse Practitioner

## 2020-10-03 ENCOUNTER — Ambulatory Visit: Payer: Managed Care, Other (non HMO)

## 2020-10-03 NOTE — Telephone Encounter (Signed)
Pt called today following up on paperwork status, WM

## 2020-10-04 ENCOUNTER — Encounter: Payer: Self-pay | Admitting: Nurse Practitioner

## 2020-10-06 NOTE — Telephone Encounter (Signed)
Pt called to follow up on just the wellness form to see if it has been sent, she requested a call back and said that she is at work and might not be [place] to answer and said you can just leave a message with details

## 2020-10-10 ENCOUNTER — Ambulatory Visit: Payer: Managed Care, Other (non HMO)

## 2020-10-10 ENCOUNTER — Encounter: Payer: Self-pay | Admitting: Nurse Practitioner

## 2020-10-17 ENCOUNTER — Ambulatory Visit: Payer: Managed Care, Other (non HMO)

## 2020-10-19 ENCOUNTER — Encounter (INDEPENDENT_AMBULATORY_CARE_PROVIDER_SITE_OTHER): Payer: Self-pay

## 2020-11-15 ENCOUNTER — Telehealth: Payer: Self-pay | Admitting: Nurse Practitioner

## 2020-11-16 NOTE — Telephone Encounter (Signed)
LVM for patient to return call. 

## 2020-11-17 NOTE — Telephone Encounter (Signed)
Spoke with patient regarding updated forms. Forms will have to be printed and sent again on Monday. Pt notified and verbalized understanding.

## 2020-11-25 ENCOUNTER — Other Ambulatory Visit: Payer: Self-pay | Admitting: Nurse Practitioner

## 2020-11-25 DIAGNOSIS — E1169 Type 2 diabetes mellitus with other specified complication: Secondary | ICD-10-CM

## 2020-11-28 ENCOUNTER — Encounter: Payer: Self-pay | Admitting: Nurse Practitioner

## 2021-01-02 IMAGING — MR MRI OF THE RIGHT ANKLE WITHOUT CONTRAST
5 series · 40 of 40 positions shown · non-contrast
Comparison: X-ray 03/24/2018

CLINICAL DATA: Lateral ankle pain and swelling

EXAM:
MRI OF THE RIGHT ANKLE WITHOUT CONTRAST
TECHNIQUE: Multiplanar, multisequence MR imaging of the ankle was performed. No
intravenous contrast was administered.

[Series 3: T2 fat-sat · axial · 3.0mm · 0.50mm/px · z∈[-95,+44]mm · 10 of 36 slices shown (1 of 2)]
[im 1/36]
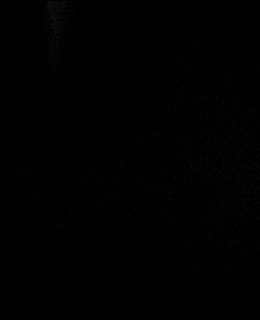
[im 4/36]
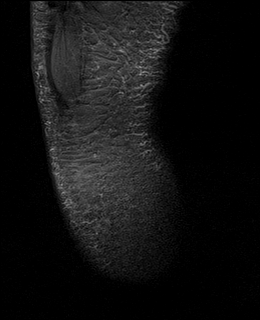
[im 8/36]
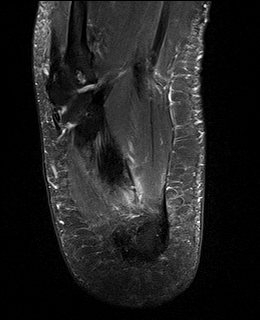
[im 12/36]
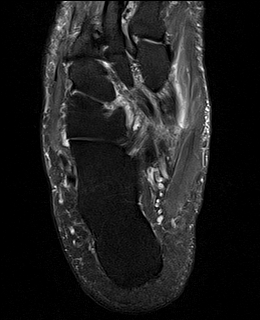
[im 16/36]
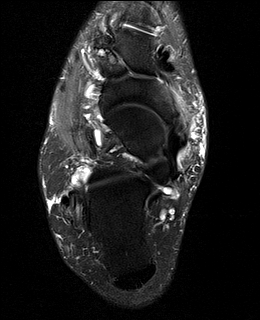
[im 20/36]
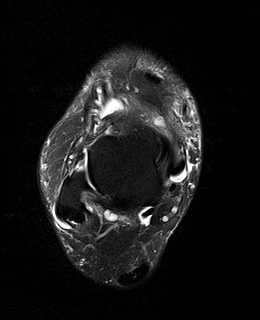
[im 24/36]
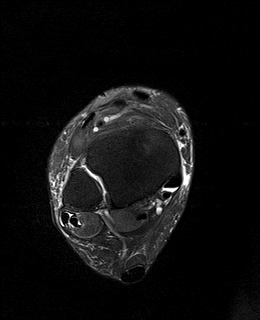
[im 28/36]
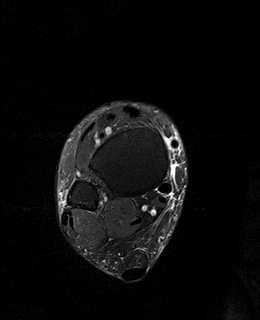
[im 32/36]
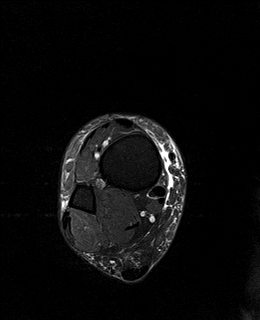
[im 36/36]
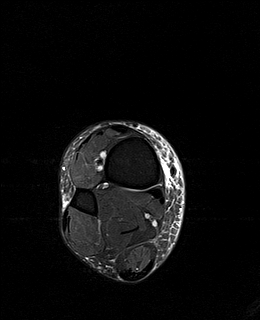

[Series 4: PD fat-sat · axial · 3.5mm · 0.47mm/px · z∈[-89,+38]mm · 8 of 30 slices shown]
[im 1/30]
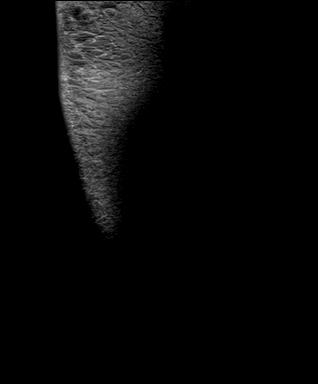
[im 5/30]
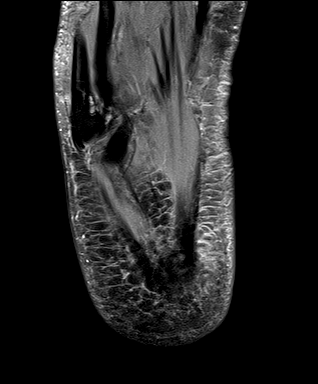
[im 9/30]
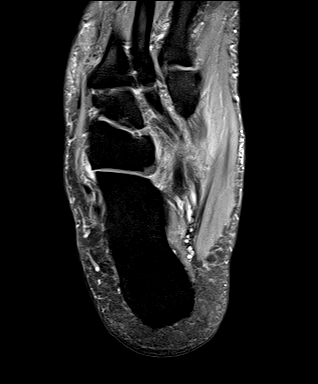
[im 13/30]
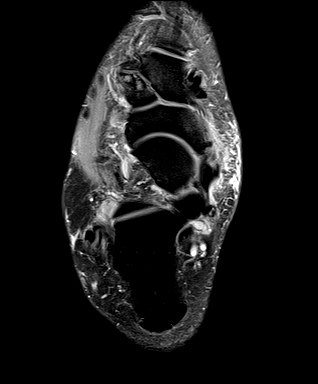
[im 17/30]
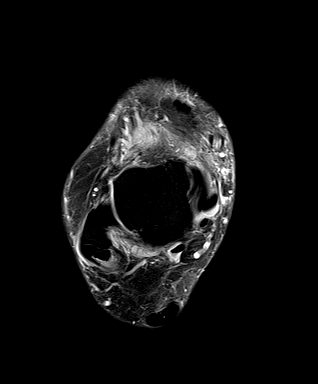
[im 21/30]
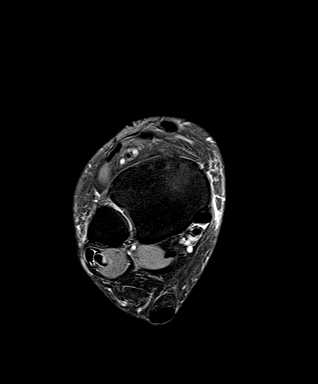
[im 25/30]
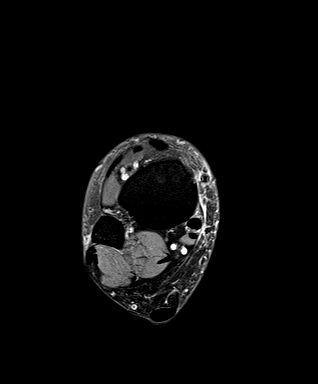
[im 30/30]
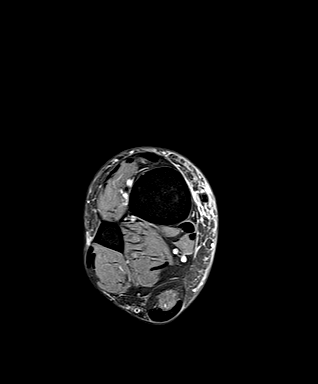

[Series 5: T2 fat-sat · coronal · 3.0mm · 0.62mm/px · 10 of 39 slices shown (2 of 2)]
[im 1/39]
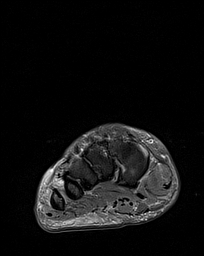
[im 5/39]
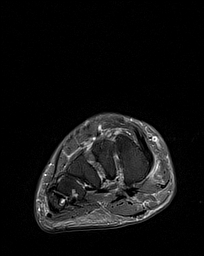
[im 9/39]
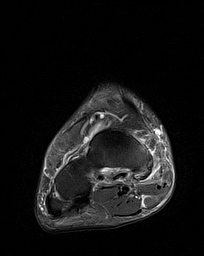
[im 13/39]
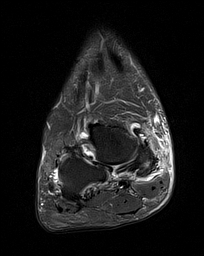
[im 17/39]
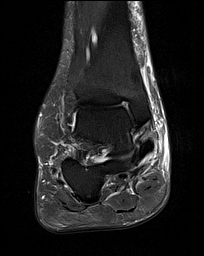
[im 22/39]
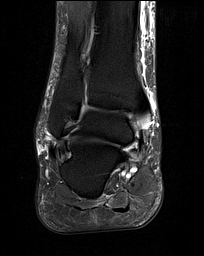
[im 26/39]
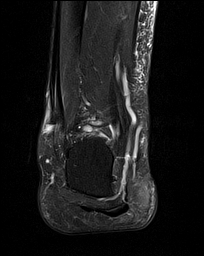
[im 30/39]
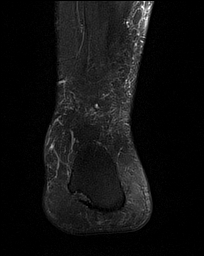
[im 34/39]
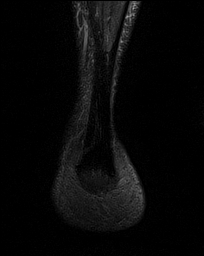
[im 39/39]
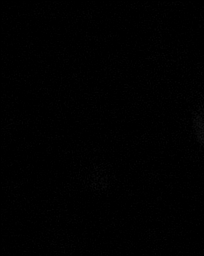

[Series 6: T1 · sagittal · 4.0mm · 0.56mm/px · 6 of 22 slices shown]
[im 1/22]
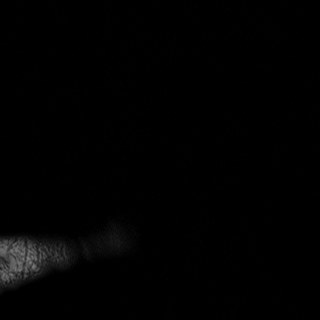
[im 5/22]
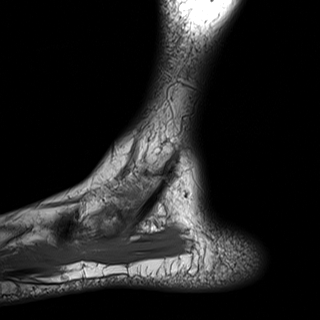
[im 9/22]
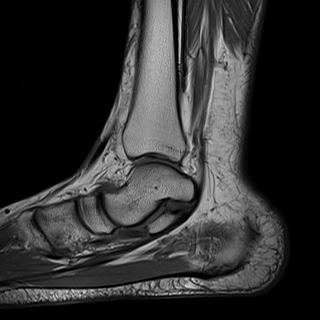
[im 13/22]
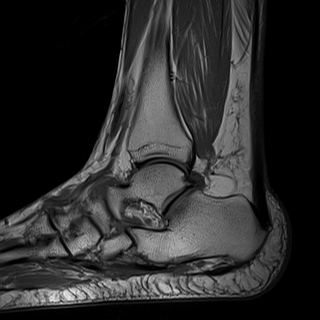
[im 17/22]
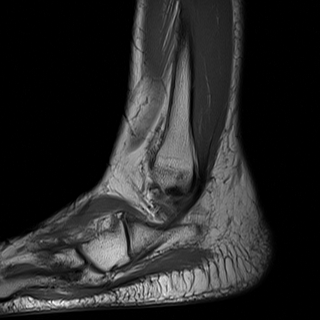
[im 22/22]
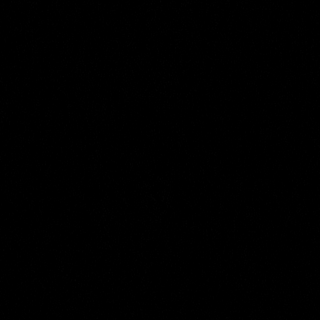

[Series 7: STIR · sagittal · 4.0mm · 0.70mm/px · 6 of 22 slices shown]
[im 1/22]
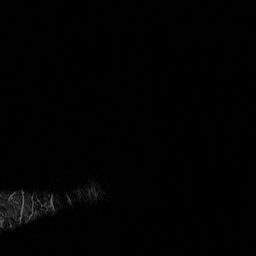
[im 5/22]
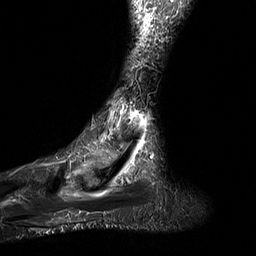
[im 9/22]
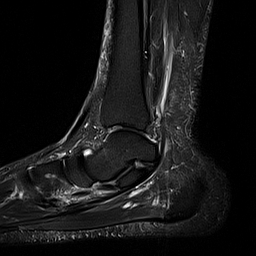
[im 13/22]
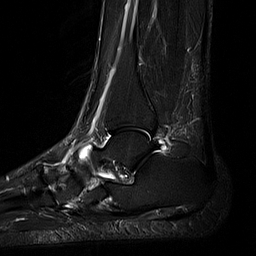
[im 17/22]
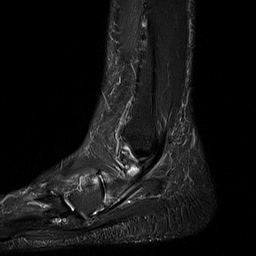
[im 22/22]
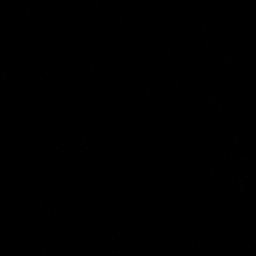

[40 of 40 positions shown; findings below may reference images not displayed]

FINDINGS: TENDONS

Peroneal: Mild focal thickening of the peroneus longus tendon at the
level of the lateral malleolus. Long segment split tear of the
peroneus brevis tendon from the myotendinous junction and extending
at least to the peroneal tubercle of the calcaneus. Trace volume of
tenosynovial fluid.

Posteromedial: Intact tibialis posterior, flexor hallucis longus and
flexor digitorum longus tendons. Trace fluid within the tibialis
posterior tendon sheath.

Anterior: Intact tibialis anterior, extensor hallucis longus and
extensor digitorum longus tendons.

Achilles: Intact.

Plantar Fascia: Intact.

LIGAMENTS

Lateral: Anterior and posterior tibiofibular ligaments are intact.
Anterior and posterior talofibular ligaments and the calcaneofibular
ligament are intact.

Medial: Deltoid ligament intact.  Spring ligament grossly intact.

CARTILAGE

Ankle Joint: There is thinning of the tibiotalar articular cartilage
without focal cartilage defect. There is a shallow notch at the
anteromedial margin of the tibial plafond suggestive of a notch of
Padam (series 5, image 23), an anatomic variant. Mild anterolateral
tibiotalar osteophytosis. No tibiotalar joint effusion.

Subtalar Joints/Sinus Tarsi: Mild subtalar joint OA without discrete
cartilage defect. Preservation of the fat signal within the sinus
tarsi.

Bones: Prominent bidirectional calcaneal enthesophytes. Mild marrow
edema within the plantar lateral aspect of the navicular. Prominent
dorsal osteophytosis of the calcaneocuboid joint. There is dorsal
capsular hypertrophy at the talonavicular joint prominent
subchondral cyst. Subchondral cyst versus intraosseous ganglion
within the fifth metatarsal base. Moderate arthropathy of the second
TMT joint. Scattered mild degenerative changes at the remaining TMT
joints.

Other: There is soft tissue prominence and subcutaneous edema, most
pronounced over the medial aspect of the ankle and midfoot.
IMPRESSION: 1. Peroneus longus tendinosis and long segment split tear of the
peroneus brevis tendon. Trace fluid within the peroneal tendon
sheath, may be reactive or reflect mild tenosynovitis.
2. Mild posterior tibialis tenosynovitis.
3. Degenerative changes of the midfoot and hindfoot, as above.

## 2021-03-19 ENCOUNTER — Other Ambulatory Visit: Payer: Self-pay | Admitting: Nurse Practitioner

## 2021-03-19 DIAGNOSIS — E1165 Type 2 diabetes mellitus with hyperglycemia: Secondary | ICD-10-CM

## 2021-06-22 ENCOUNTER — Other Ambulatory Visit: Payer: Self-pay | Admitting: Nurse Practitioner

## 2021-06-22 DIAGNOSIS — I10 Essential (primary) hypertension: Secondary | ICD-10-CM

## 2021-06-26 NOTE — Telephone Encounter (Signed)
Chart supports Rx ?Last seen 07/2020 ?Pharmacy instructed to inform patient f/u needed for additional refills.  ?

## 2021-07-18 ENCOUNTER — Other Ambulatory Visit: Payer: Self-pay | Admitting: Family Medicine

## 2021-07-18 DIAGNOSIS — Z1231 Encounter for screening mammogram for malignant neoplasm of breast: Secondary | ICD-10-CM

## 2021-07-24 ENCOUNTER — Ambulatory Visit: Payer: Managed Care, Other (non HMO)

## 2021-08-01 ENCOUNTER — Ambulatory Visit
Admission: RE | Admit: 2021-08-01 | Discharge: 2021-08-01 | Disposition: A | Discharge status: Home / Self Care | Payer: Managed Care, Other (non HMO) | Source: Ambulatory Visit | Attending: Family Medicine | Admitting: Family Medicine

## 2021-08-01 DIAGNOSIS — Z1231 Encounter for screening mammogram for malignant neoplasm of breast: Secondary | ICD-10-CM

## 2021-09-19 ENCOUNTER — Encounter (INDEPENDENT_AMBULATORY_CARE_PROVIDER_SITE_OTHER): Payer: Self-pay

## 2021-10-25 ENCOUNTER — Encounter: Payer: Self-pay | Admitting: Cardiovascular Disease

## 2021-10-25 ENCOUNTER — Ambulatory Visit: Payer: Managed Care, Other (non HMO) | Attending: Cardiovascular Disease | Admitting: Cardiovascular Disease

## 2021-10-25 VITALS — BP 132/76 | HR 79 | Ht 65.0 in | Wt 326.0 lb

## 2021-10-25 DIAGNOSIS — R0789 Other chest pain: Secondary | ICD-10-CM | POA: Insufficient documentation

## 2021-10-25 DIAGNOSIS — I10 Essential (primary) hypertension: Secondary | ICD-10-CM

## 2021-10-25 NOTE — Patient Instructions (Signed)
Medication Instructions:  STOP Amlodipine *If you need a refill on your cardiac medications before your next appointment, please call your pharmacy*   Lab Work: NONE If you have labs (blood work) drawn today and your tests are completely normal, you will receive your results only by: [place] (if you have MyChart) OR A paper copy in the mail If you have any lab test that is abnormal or we need to change your treatment, we will call you to review the results.   Testing/Procedures: NONE   Follow-Up: At [facility], you and your health needs are our priority.  As part of our continuing mission to provide you with exceptional heart care, we have created designated Provider Care Teams.  These Care Teams include your primary Cardiologist (physician) and Advanced Practice Providers (APPs -  Physician Assistants and Nurse Practitioners) who all work together to provide you with the care you need, when you need it.  We recommend signing up for the patient portal called "MyChart".  Sign up information is provided on this After Visit Summary.  MyChart is used to connect with patients for Virtual Visits (Telemedicine).  Patients are [place] to view lab/test results, encounter notes, upcoming appointments, etc.  Non-urgent messages can be sent to your provider as well.   To learn more about what you can do with MyChart, go to [email or web address].    Your next appointment:   6 month(s)  The format for your next appointment:   In Person  Provider:   Mertie Moores, MD    Important Information About Sugar

## 2021-10-25 NOTE — Progress Notes (Signed)
Cardiology Office Note:    Date:  10/25/2021   ID:  Barron Schmid Leete, DOB 05/01/1960, MRN [id number]  PCP:  Flossie Buffy, NP   [place] Providers Cardiologist:  None {  Referring MD: Almedia Balls, NP   Chief Complaint  Patient presents with   Chest Pain     History of Present Illness:    Ruth [name] is a 61 y.o. female with a hx of chest pain   Has been present for several months  Comes and goes  Lasts for a split second Is not associated with exercise - not with work  Might be associated with eating or drinking  Not assoiicated with twisting or turing torso   Has not had any chest pain in the past month  Works - desk work mostly  Works at [employer].  Hx of DM, HLD ,   Mother had TIAs   Past Medical History:  Diagnosis Date   Anemia    Arthritis    Back pain    Bilateral calcaneal spurs    Diabetes mellitus without complication ([place])    type 2   GERD (gastroesophageal reflux disease)    Gout    Hyperlipidemia    Hypertension    Obesity    Osteoarthritis    Palpitations    Swallowing difficulty    Vitamin D deficiency     Past Surgical History:  Procedure Laterality Date   ABDOMINAL HYSTERECTOMY     partial   CHOLECYSTECTOMY     COLONOSCOPY WITH PROPOFOL N/A 10/29/2016   Procedure: COLONOSCOPY WITH PROPOFOL;  Surgeon: Yetta Flock, MD;  Location: [facility];  Service: Gastroenterology;  Laterality: N/A;   DENTAL SURGERY     ORIF TIBIA & FIBULA FRACTURES Left     Current Medications: Current Meds  Medication Sig   atenolol (TENORMIN) 50 MG tablet TAKE 1 TABLET(50 MG) BY MOUTH DAILY   atorvastatin (LIPITOR) 40 MG tablet Take 1 tablet (40 mg total) by mouth 3 (three) times a week.   glipiZIDE (GLUCOTROL XL) 2.5 MG 24 hr tablet Take 1 tablet (2.5 mg total) by mouth daily with breakfast. With heaviest meals   olmesartan (BENICAR) 40 MG tablet Take 40 mg by mouth daily.   OZEMPIC, 1 MG/DOSE, 4 MG/3ML SOPN INJECT  SUBCUTANEOUSLY 1 MG  EVERY WEEK     Allergies:   Lisinopril and Tape   Social History   Socioeconomic History   Marital status: Married    Spouse name: Nadara Mustard   Number of children: 3   Years of education: Not on file   Highest education level: Not on file  Occupational History   Occupation: Reference Clerk  Tobacco Use   Smoking status: Former    Packs/day: 1.00    Years: 20.00    Total pack years: 20.00    Types: Cigarettes    Quit date: 02/12/2008    Years since quitting: 13.7   Smokeless tobacco: Never  Vaping Use   Vaping Use: Never used  Substance and Sexual Activity   Alcohol use: Yes    Alcohol/week: 1.0 standard drink of alcohol    Types: 1 Glasses of wine per week   Drug use: No   Sexual activity: Yes    Birth control/protection: Surgical, Post-menopausal  Other Topics Concern   Not on file  Social History Narrative   Not on file   Social Determinants of Health   Financial Resource Strain: Not on file  Food Insecurity:  Not on file  Transportation Needs: Not on file  Physical Activity: Not on file  Stress: Not on file  Social Connections: Not on file     Family History: The patient's family history includes Alcoholism in her father; Cancer in her maternal grandmother; Colon polyps in her sister; Dementia in her mother; Diabetes in her mother; Eating disorder in her mother; Hypertension in her mother; Kidney disease in her sister; Obesity in her mother; Pancreatic cancer in her father; Stroke in her mother; Sudden death in her father.  ROS:   Please see the history of present illness.     All other systems reviewed and are negative.  EKGs/Labs/Other Studies Reviewed:    The following studies were reviewed today:   EKG: October 25, 2021: Normal sinus rhythm at 79.  Normal EKG.  Recent Labs: No results found for requested labs within last 365 days.  Recent Lipid Panel    Component Value Date/Time   CHOL 150 05/30/2020 0919   TRIG 48 05/30/2020  0919   HDL 60 05/30/2020 0919   CHOLHDL 2.5 05/30/2020 0919   CHOLHDL 4.0 08/21/2018 1401   VLDL 22.0 08/02/2016 1659   LDLCALC 80 05/30/2020 0919   LDLCALC 138 (H) 08/21/2018 1401     Risk Assessment/Calculations:                Physical Exam:    VS:  BP 132/76   Pulse 79   Ht '5\' 5"'$  (1.651 m)   Wt (!) 326 lb (147.9 kg)   SpO2 96%   BMI 54.25 kg/m     Wt Readings from Last 3 Encounters:  10/25/21 (!) 326 lb (147.9 kg)  07/13/20 (!) 323 lb 12.8 oz (146.9 kg)  05/30/20 (!) 324 lb 6.4 oz (147.1 kg)     GEN: morbidly obese female,  in no acute distress HEENT: Normal NECK: No JVD; No carotid bruits LYMPHATICS: No lymphadenopathy CARDIAC: RRR, no murmurs, rubs, gallops RESPIRATORY:  Clear to auscultation without rales, wheezing or rhonchi  ABDOMEN: Soft, non-tender, non-distended MUSCULOSKELETAL:  No edema; No deformity  SKIN: Warm and dry NEUROLOGIC:  Alert and oriented x 3 PSYCHIATRIC:  Normal affect   ASSESSMENT:    1. Essential hypertension   2. Morbidly obese (HCC)    PLAN:       Atypical CP .  She is having intermittent episodes of atypical chest pain.  These do not sound cardiac.  It sounds more like gastroesophageal reflux.  That the pains have largely resolved and she has not had any further pain in a month.  I have advised her to work on a low-fat low-cholesterol diet.  I have asked her to exercise on a regular basis.  We will see her again in 6 months for a follow-up visit.  If she is doing well at that time we will see her on an as-needed basis.   2. HTN:  bp is well controlled  3. Morbid obesity:  on ozympic.   Encourage improved diet.            Medication Adjustments/Labs and Tests Ordered: Current medicines are reviewed at length with the patient today.  Concerns regarding medicines are outlined above.  Orders Placed This Encounter  Procedures   EKG 12-Lead   No orders of the defined types were placed in this  encounter.   Patient Instructions  Medication Instructions:  STOP Amlodipine *If you need a refill on your cardiac medications before your next appointment, please call your  pharmacy*   Lab Work: NONE If you have labs (blood work) drawn today and your tests are completely normal, you will receive your results only by: [place] (if you have MyChart) OR A paper copy in the mail If you have any lab test that is abnormal or we need to change your treatment, we will call you to review the results.   Testing/Procedures: NONE   Follow-Up: At [facility], you and your health needs are our priority.  As part of our continuing mission to provide you with exceptional heart care, we have created designated Provider Care Teams.  These Care Teams include your primary Cardiologist (physician) and Advanced Practice Providers (APPs -  Physician Assistants and Nurse Practitioners) who all work together to provide you with the care you need, when you need it.  We recommend signing up for the patient portal called "MyChart".  Sign up information is provided on this After Visit Summary.  MyChart is used to connect with patients for Virtual Visits (Telemedicine).  Patients are [place] to view lab/test results, encounter notes, upcoming appointments, etc.  Non-urgent messages can be sent to your provider as well.   To learn more about what you can do with MyChart, go to [email or web address].    Your next appointment:   6 month(s)  The format for your next appointment:   In Person  Provider:   Mertie Moores, MD    Important Information About Sugar         Signed, Mertie Moores, MD  10/25/2021 5:18 PM    Acc[facility]

## 2021-11-22 ENCOUNTER — Other Ambulatory Visit: Payer: Self-pay | Admitting: Family Medicine

## 2022-01-15 ENCOUNTER — Telehealth: Payer: Self-pay | Admitting: Nurse Practitioner

## 2022-01-15 ENCOUNTER — Ambulatory Visit: Payer: Managed Care, Other (non HMO) | Admitting: Nurse Practitioner

## 2022-01-15 NOTE — Telephone Encounter (Signed)
Pt arrived 9:31a and was advised she would need to reschedule. Per Barnett Applebaum, pt raised hands in the air and walked out of the office without saying anything or rescheduling appointment. Pt later called to speak with Eve and asked about coming in this afternoon. Pt was advised no opening available.  1st no show, fee waived, letter sent

## 2022-01-15 NOTE — Telephone Encounter (Signed)
Pt was late to her visit with Baldo Ash on 01/15/22, I sent a no show letter.

## 2022-04-20 NOTE — Progress Notes (Signed)
This encounter was created in error - please disregard.

## 2022-04-22 ENCOUNTER — Other Ambulatory Visit: Payer: Self-pay | Admitting: Nurse Practitioner

## 2022-04-22 DIAGNOSIS — E782 Mixed hyperlipidemia: Secondary | ICD-10-CM

## 2022-04-23 ENCOUNTER — Ambulatory Visit: Payer: Managed Care, Other (non HMO) | Admitting: Cardiovascular Disease

## 2022-04-25 ENCOUNTER — Encounter (HOSPITAL_COMMUNITY): Payer: Self-pay

## 2022-04-25 ENCOUNTER — Emergency Department (HOSPITAL_COMMUNITY): Payer: Medicaid Other

## 2022-04-25 ENCOUNTER — Emergency Department (HOSPITAL_COMMUNITY)
Admission: EM | Admit: 2022-04-25 | Discharge: 2022-04-25 | Disposition: A | Discharge status: Home / Self Care | Payer: Medicaid Other | Attending: Emergency Medicine | Admitting: Emergency Medicine

## 2022-04-25 DIAGNOSIS — M545 Low back pain, unspecified: Secondary | ICD-10-CM | POA: Diagnosis present

## 2022-04-25 DIAGNOSIS — E119 Type 2 diabetes mellitus without complications: Secondary | ICD-10-CM | POA: Insufficient documentation

## 2022-04-25 DIAGNOSIS — Z7984 Long term (current) use of oral hypoglycemic drugs: Secondary | ICD-10-CM | POA: Diagnosis not present

## 2022-04-25 DIAGNOSIS — I10 Essential (primary) hypertension: Secondary | ICD-10-CM | POA: Diagnosis not present

## 2022-04-25 DIAGNOSIS — Z79899 Other long term (current) drug therapy: Secondary | ICD-10-CM | POA: Diagnosis not present

## 2022-04-25 LAB — URINALYSIS, ROUTINE W REFLEX MICROSCOPIC
Bilirubin Urine: NEGATIVE
Glucose, UA: 50 mg/dL — AB
Hgb urine dipstick: NEGATIVE
Ketones, ur: NEGATIVE mg/dL
Leukocytes,Ua: NEGATIVE
Nitrite: NEGATIVE
Protein, ur: NEGATIVE mg/dL
Specific Gravity, Urine: 1.013 (ref 1.005–1.030)
pH: 5 (ref 5.0–8.0)

## 2022-04-25 MED ORDER — DIAZEPAM 5 MG PO TABS
5.0000 mg | ORAL_TABLET | Freq: Once | ORAL | Status: AC
  Administered 2022-04-25: 5 mg via ORAL
  Filled 2022-04-25: qty 1

## 2022-04-25 MED ORDER — CYCLOBENZAPRINE HCL 10 MG PO TABS: 10.0000 mg | ORAL_TABLET | Freq: Two times a day (BID) | ORAL | 0 refills | Status: DC | PRN

## 2022-04-25 MED ORDER — KETOROLAC TROMETHAMINE 15 MG/ML IJ SOLN
15.0000 mg | Freq: Once | INTRAMUSCULAR | Status: AC
  Administered 2022-04-25: 15 mg via INTRAMUSCULAR
  Filled 2022-04-25: qty 1

## 2022-04-25 MED ORDER — IBUPROFEN 600 MG PO TABS: 600.0000 mg | ORAL_TABLET | Freq: Four times a day (QID) | ORAL | 0 refills | Status: DC | PRN

## 2022-04-25 NOTE — Discharge Instructions (Signed)
Your pain is likely due to muscle strain causing discomfort.  Please take medication prescribed as needed for symptom control.  Please be aware your blood pressure is elevated today.  Talk to your primary care doctor for further management of your high blood pressure.  X-ray of your hip and pelvis did not show any concerning finding.  Your urinalysis did not show any signs of urinary tract infection or kidney stone.

## 2022-04-25 NOTE — ED Triage Notes (Signed)
Pt presents with c/o right hip pain and lower right back pain for approx 3 weeks. Pt denies any injury.

## 2022-04-25 NOTE — ED Provider Notes (Signed)
[name] Note   CSN: [id number] Arrival date & time: 04/25/22  0755     History  Chief Complaint  Patient presents with   Hip Pain   Back Pain    Ruth [name] is a 62 y.o. female.  The history is provided by the patient and medical records. No language interpreter was used.  Hip Pain  Back Pain    62 year old female no significant history of obesity, diabetes, chronic back pain, hypertension, GERD, osteoarthritis, degenerative disc disease who presenting with complaints of lower back pain.  Patient report for the past 3 weeks she has had recurrent pain primarily to her right hip and lower back region.  Pain is more notable when she walks and move around and improves with rest.  Pain does not radiates down to her leg.  No fever chills no nausea vomiting diarrhea no dysuria or hematuria at home she is taking Tylenol and ibuprofen with some relief.  No history of IV drug use or active cancer.  No history of sciatica.  Home Medications Prior to Admission medications   Medication Sig Start Date End Date Taking? Authorizing Provider  atenolol (TENORMIN) 50 MG tablet TAKE 1 TABLET(50 MG) BY MOUTH DAILY 06/26/21   Nche, Charlene Brooke, NP  atorvastatin (LIPITOR) 40 MG tablet Take 1 tablet (40 mg total) by mouth 3 (three) times a week. 07/21/20   Nche, Charlene Brooke, NP  chlorthalidone (HYGROTON) 25 MG tablet Take 12.5 mg by mouth every morning. 04/04/22   [provider]  glipiZIDE (GLUCOTROL XL) 2.5 MG 24 hr tablet Take 1 tablet (2.5 mg total) by mouth daily with breakfast. With heaviest meals 06/22/19   Nche, Charlene Brooke, NP  metFORMIN (GLUCOPHAGE) 850 MG tablet Take 1 tablet (850 mg total) by mouth daily. Patient not taking: Reported on 10/25/2021 08/10/20   Nche, Charlene Brooke, NP  olmesartan (BENICAR) 40 MG tablet Take 40 mg by mouth daily. 10/10/21   [provider]  OZEMPIC, 1 MG/DOSE, 4 MG/3ML SOPN INJECT  SUBCUTANEOUSLY 1 MG  EVERY WEEK 01/11/21   Nche, Charlene Brooke, NP      Allergies    Lisinopril and Tape    Review of Systems   Review of Systems  Musculoskeletal:  Positive for back pain.  All other systems reviewed and are negative.   Physical Exam Updated Vital Signs BP (!) 193/107 (BP Location: Left Arm)   Pulse 72   Temp 97.9 F (36.6 C) (Oral)   Resp 18   SpO2 100%  Physical Exam Vitals and nursing note reviewed.  Constitutional:      General: She is not in acute distress.    Appearance: She is well-developed. She is obese.  HENT:     Head: Atraumatic.  Eyes:     Conjunctiva/sclera: Conjunctivae normal.  Pulmonary:     Effort: Pulmonary effort is normal.  Abdominal:     Palpations: Abdomen is soft.     Tenderness: There is no abdominal tenderness.  Musculoskeletal:        General: Tenderness (Mild tenderness to right lumbar sacral region and right paralumbar spinal muscle.  Lombardozzi to flex and extend lower back.) present.     Cervical back: Neck supple.     Comments: Equal strength to bilateral lower extremities with intact distal pedal pulses.  [place] to ambulate.  Skin:    Capillary Refill: Capillary refill takes less than 2 seconds.     Findings: No rash.  Neurological:     Mental Status: She is alert.  Psychiatric:        Mood and Affect: Mood normal.     ED Results / Procedures / Treatments   Labs (all labs ordered are listed, but only abnormal results are displayed) Labs Reviewed  URINALYSIS, ROUTINE W REFLEX MICROSCOPIC - Abnormal; Notable for the following components:      Result Value   Glucose, UA 50 (*)    All other components within normal limits    EKG None  Radiology DG Hip Unilat W or Wo Pelvis 2-3 Views Right  Result Date: 04/25/2022 CLINICAL DATA:  Right hip pain. EXAM: DG HIP (WITH OR WITHOUT PELVIS) 2-3V RIGHT COMPARISON:  07/19/2020 FINDINGS: Both hips are normally located. Mild bilateral hip joint degenerative changes but no plain  film evidence of fracture or AVN. The pubic symphysis and SI joints are intact. No pelvic fractures or bone lesions. IMPRESSION: 1. Mild bilateral hip joint degenerative changes. 2. No acute bony findings. Electronically Signed   By: Marijo Sanes M.D.   On: 04/25/2022 09:34    Procedures Procedures    Medications Ordered in ED Medications  ketorolac (TORADOL) 15 MG/ML injection 15 mg (15 mg Intramuscular Given 04/25/22 0905)  diazepam (VALIUM) tablet 5 mg (5 mg Oral Given 04/25/22 [id number])    ED Course/ Medical Decision Making/ A&P                             Medical Decision Making Amount and/or Complexity of Data Reviewed Labs: ordered. Radiology: ordered.  Risk Prescription drug management.   BP (!) 193/107 (BP Location: Left Arm)   Pulse 72   Temp 97.9 F (36.6 C) (Oral)   Resp 18   SpO2 100%   47:92 AM  62 year old female no significant history of obesity, diabetes, chronic back pain, hypertension, GERD, osteoarthritis, degenerative disc disease who presenting with complaints of lower back pain.  Patient report for the past 3 weeks she has had recurrent pain primarily to her right hip and lower back region.  Pain is more notable when she walks and move around and improves with rest.  Pain does not radiates down to her leg.  No fever chills no nausea vomiting diarrhea no dysuria or hematuria at home she is taking Tylenol and ibuprofen with some relief.  No history of IV drug use or active cancer.  No history of sciatica.  On exam this is a morbidly obese female sitting in the chair appears uncomfortable.  She is trying to find a comfortable position, arching her back.  She does have some tenderness to her right hip and right lumbar paraspinal muscle without any overlying skin changes.  She has full range of motion about her hip and Silva to ambulate.  She is neurovascularly intact.  She does not have any CVA tenderness.  Vital signs remarkable for elevated blood pressure of  193/107.  No fever no hypoxia.  Will recheck blood pressure.  Lower back pain likely musculoskeletal in origin.  Will provide supportive care which include Toradol and Valium.  Doubt sciatica.  No red flags.  Doubt cauda equina.  Given prolonged duration of pain, x-ray of right hip and pelvis obtained.  Will check UA as well.  10:19 AM Hospital admission considered, labs and imaging obtained which was independently interpreted by me and I agree with radiology interpretation.  Urinalysis without any hematuria or signs of UTI.  X-ray  of right hip and pelvis with signs of degenerative joint changes but no acute bony finding.          Final Clinical Impression(s) / ED Diagnoses Final diagnoses:  Acute right-sided low back pain without sciatica    Rx / DC Orders ED Discharge Orders          Ordered    ibuprofen (ADVIL) 600 MG tablet  Every 6 hours PRN        04/25/22 1039    cyclobenzaprine (FLEXERIL) 10 MG tablet  2 times daily PRN        04/25/22 1039              Domenic Moras, PA-C 04/25/22 1040    Blanchie Dessert, MD 04/29/22 2108

## 2022-05-06 ENCOUNTER — Ambulatory Visit (INDEPENDENT_AMBULATORY_CARE_PROVIDER_SITE_OTHER): Payer: Managed Care, Other (non HMO) | Admitting: Adult Health

## 2022-05-06 ENCOUNTER — Encounter: Payer: Self-pay | Admitting: Adult Health

## 2022-05-06 VITALS — BP 143/88 | HR 81 | Temp 97.3°F | Resp 18 | Ht 65.0 in | Wt 343.4 lb

## 2022-05-06 DIAGNOSIS — E1169 Type 2 diabetes mellitus with other specified complication: Secondary | ICD-10-CM

## 2022-05-06 DIAGNOSIS — I10 Essential (primary) hypertension: Secondary | ICD-10-CM

## 2022-05-06 DIAGNOSIS — Z7689 Persons encountering health services in other specified circumstances: Secondary | ICD-10-CM

## 2022-05-06 DIAGNOSIS — M5441 Lumbago with sciatica, right side: Secondary | ICD-10-CM

## 2022-05-06 DIAGNOSIS — G8929 Other chronic pain: Secondary | ICD-10-CM

## 2022-05-06 DIAGNOSIS — N6311 Unspecified lump in the right breast, upper outer quadrant: Secondary | ICD-10-CM

## 2022-05-06 DIAGNOSIS — Z113 Encounter for screening for infections with a predominantly sexual mode of transmission: Secondary | ICD-10-CM

## 2022-05-06 DIAGNOSIS — E7849 Other hyperlipidemia: Secondary | ICD-10-CM

## 2022-05-06 DIAGNOSIS — Z6841 Body Mass Index (BMI) 40.0 and over, adult: Secondary | ICD-10-CM

## 2022-05-06 NOTE — Patient Instructions (Signed)
Preventive Care 40-62 Years Old, Female Preventive care refers to lifestyle choices and visits with your health care provider that can promote health and wellness. Preventive care visits are also called wellness exams. What can I expect for my preventive care visit? Counseling Your health care provider may ask you questions about your: Medical history, including: Past medical problems. Family medical history. Pregnancy history. Current health, including: Menstrual cycle. Method of birth control. Emotional well-being. Home life and relationship well-being. Sexual activity and sexual health. Lifestyle, including: Alcohol, nicotine or tobacco, and drug use. Access to firearms. Diet, exercise, and sleep habits. Work and work environment. Sunscreen use. Safety issues such as seatbelt and bike helmet use. Physical exam Your health care provider will check your: Height and weight. These may be used to calculate your BMI (body mass index). BMI is a measurement that tells if you are at a healthy weight. Waist circumference. This measures the distance around your waistline. This measurement also tells if you are at a healthy weight and may help predict your risk of certain diseases, such as type 2 diabetes and high blood pressure. Heart rate and blood pressure. Body temperature. Skin for abnormal spots. What immunizations do I need?  Vaccines are usually given at various ages, according to a schedule. Your health care provider will recommend vaccines for you based on your age, medical history, and lifestyle or other factors, such as travel or where you work. What tests do I need? Screening Your health care provider may recommend screening tests for certain conditions. This may include: Lipid and cholesterol levels. Diabetes screening. This is done by checking your blood sugar (glucose) after you have not eaten for a while (fasting). Pelvic exam and Pap test. Hepatitis B test. Hepatitis C  test. HIV (human immunodeficiency virus) test. STI (sexually transmitted infection) testing, if you are at risk. Lung cancer screening. Colorectal cancer screening. Mammogram. Talk with your health care provider about when you should start having regular mammograms. This may depend on whether you have a family history of breast cancer. BRCA-related cancer screening. This may be done if you have a family history of breast, ovarian, tubal, or peritoneal cancers. Bone density scan. This is done to screen for osteoporosis. Talk with your health care provider about your test results, treatment options, and if necessary, the need for more tests. Follow these instructions at home: Eating and drinking  Eat a diet that includes fresh fruits and vegetables, whole grains, lean protein, and low-fat dairy products. Take vitamin and mineral supplements as recommended by your health care provider. Do not drink alcohol if: Your health care provider tells you not to drink. You are pregnant, may be pregnant, or are planning to become pregnant. If you drink alcohol: Limit how much you have to 0-1 drink a day. Know how much alcohol is in your drink. In the [place] one drink equals one 12 oz bottle of beer (355 mL), one 5 oz glass of wine (148 mL), or one 1 oz glass of hard liquor (44 mL). Lifestyle Brush your teeth every morning and night with fluoride toothpaste. Floss one time each day. Exercise for at least 30 minutes 5 or more days each week. Do not use any products that contain nicotine or tobacco. These products include cigarettes, chewing tobacco, and vaping devices, such as e-cigarettes. If you need help quitting, ask your health care provider. Do not use drugs. If you are sexually active, practice safe sex. Use a condom or other form of protection to   prevent STIs. If you do not wish to become pregnant, use a form of birth control. If you plan to become pregnant, see your health care provider for a  prepregnancy visit. Take aspirin only as told by your health care provider. Make sure that you understand how much to take and what form to take. Work with your health care provider to find out whether it is safe and beneficial for you to take aspirin daily. Find healthy ways to manage stress, such as: Meditation, yoga, or listening to music. Journaling. Talking to a trusted person. Spending time with friends and family. Minimize exposure to UV radiation to reduce your risk of skin cancer. Safety Always wear your seat belt while driving or riding in a vehicle. Do not drive: If you have been drinking alcohol. Do not ride with someone who has been drinking. When you are tired or distracted. While texting. If you have been using any mind-altering substances or drugs. Wear a helmet and other protective equipment during sports activities. If you have firearms in your house, make sure you follow all gun safety procedures. Seek help if you have been physically or sexually abused. What's next? Visit your health care provider once a year for an annual wellness visit. Ask your health care provider how often you should have your eyes and teeth checked. Stay up to date on all vaccines. This information is not intended to replace advice given to you by your health care provider. Make sure you discuss any questions you have with your health care provider. Document Revised: 07/26/2020 Document Reviewed: 07/26/2020 Elsevier Patient Education  2023 Elsevier Inc.  

## 2022-05-06 NOTE — Progress Notes (Signed)
[facility] clinic  Provider:  Durenda Age DNP  Code Status:  Full Code  Goals of Care:     05/06/2022    1:36 PM  Advanced Directives  Does Patient Have a Medical Advance Directive? No  Would patient like information on creating a medical advance directive? No - Patient declined     Chief Complaint  Patient presents with   Establish Care    New patient to establish care     HPI: Patient is a 62 y.o. female seen today to establish care with [place]. She is married with 2 sons and a daughter. She has 6 grandchildren . She has an associate degree and cuurently on FMLA at [employer]. She has chronic back pains. She rated her back pain as 1/10 today. Her mother died at 58 Y/O, has diabetes, and her father died at 45 Y/O, has hypertension. She has 2 older sisters, one goes to hemodialysis and one has hypertension. She recently noted a mass on her right outer upper quadrant breast. She denies tenderness when palpated.  Past Medical History:  Diagnosis Date   Anemia    Arthritis    Back pain    Bilateral calcaneal spurs    Diabetes mellitus without complication (HCC)    type 2   GERD (gastroesophageal reflux disease)    Gout    Hyperlipidemia    Hypertension    Obesity    Osteoarthritis    Palpitations    Swallowing difficulty    Vitamin D deficiency     Past Surgical History:  Procedure Laterality Date   ABDOMINAL HYSTERECTOMY     partial   CHOLECYSTECTOMY     COLONOSCOPY WITH PROPOFOL N/A 10/29/2016   Procedure: COLONOSCOPY WITH PROPOFOL;  Surgeon: Yetta Flock, MD;  Location: [facility];  Service: Gastroenterology;  Laterality: N/A;   DENTAL SURGERY     ORIF TIBIA & FIBULA FRACTURES Left     Allergies  Allergen Reactions   Lisinopril Swelling    Throat swelling   Tape Other (See Comments)    Certain Band aids cause skin irritation    Outpatient Encounter Medications as of 05/06/2022  Medication Sig   atenolol (TENORMIN) 50 MG tablet TAKE 1 TABLET(50  MG) BY MOUTH DAILY   atorvastatin (LIPITOR) 40 MG tablet Take 1 tablet (40 mg total) by mouth 3 (three) times a week.   chlorthalidone (HYGROTON) 25 MG tablet Take 12.5 mg by mouth every morning.   glipiZIDE (GLUCOTROL XL) 2.5 MG 24 hr tablet Take 1 tablet (2.5 mg total) by mouth daily with breakfast. With heaviest meals   olmesartan (BENICAR) 40 MG tablet Take 40 mg by mouth daily.   OZEMPIC, 1 MG/DOSE, 4 MG/3ML SOPN INJECT SUBCUTANEOUSLY 1 MG  EVERY WEEK   [DISCONTINUED] cyclobenzaprine (FLEXERIL) 10 MG tablet Take 1 tablet (10 mg total) by mouth 2 (two) times daily as needed for muscle spasms.   [DISCONTINUED] ibuprofen (ADVIL) 600 MG tablet Take 1 tablet (600 mg total) by mouth every 6 (six) hours as needed.   [DISCONTINUED] metFORMIN (GLUCOPHAGE) 850 MG tablet Take 1 tablet (850 mg total) by mouth daily. (Patient not taking: Reported on 10/25/2021)   No facility-administered encounter medications on file as of 05/06/2022.    Review of Systems:  Review of Systems  Constitutional:  Negative for appetite change, chills, fatigue and fever.  HENT:  Negative for congestion, hearing loss, rhinorrhea and sore throat.   Eyes: Negative.   Respiratory:  Negative for cough, shortness of  breath and wheezing.   Cardiovascular:  Negative for chest pain, palpitations and leg swelling.  Gastrointestinal:  Negative for abdominal pain, constipation, diarrhea, nausea and vomiting.  Genitourinary:  Negative for dysuria.  Musculoskeletal:  Negative for arthralgias, back pain and myalgias.  Skin:  Negative for color change, rash and wound.  Neurological:  Negative for dizziness, weakness and headaches.  Psychiatric/Behavioral:  Negative for behavioral problems. The patient is not nervous/anxious.     Health Maintenance  Topic Date Due   OPHTHALMOLOGY EXAM  Never done   Zoster Vaccines- Shingrix (1 of 2) Never done   Lung Cancer Screening  Never done   COVID-19 Vaccine (3 - Pfizer risk series) 06/15/2019    PAP SMEAR-Modifier  08/22/2020   Diabetic kidney evaluation - Urine ACR  09/23/2020   HEMOGLOBIN A1C  11/29/2020   Diabetic kidney evaluation - eGFR measurement  05/30/2021   FOOT EXAM  07/13/2021   INFLUENZA VACCINE  09/11/2021   MAMMOGRAM  08/02/2023   COLONOSCOPY (Pts 45-13yrs Insurance coverage will need to be confirmed)  10/30/2026   DTaP/Tdap/Td (2 - Td or Tdap) 10/25/2028   Hepatitis C Screening  Completed   HIV Screening  Completed   HPV VACCINES  Aged Out    Physical Exam: Vitals:   05/06/22 1341  BP: (!) 143/88  Pulse: 81  Resp: 18  Temp: (!) 97.3 F (36.3 C)  SpO2: 94%  Weight: (!) 343 lb 6 oz (155.8 kg)  Height: 5\' 5"  (1.651 m)   Body mass index is 57.14 kg/m. Physical Exam Constitutional:      Appearance: Normal appearance. She is obese.     Comments: Morbidly obese  HENT:     Head: Normocephalic and atraumatic.     Nose: Nose normal.     Mouth/Throat:     Mouth: Mucous membranes are moist.  Eyes:     Conjunctiva/sclera: Conjunctivae normal.  Cardiovascular:     Rate and Rhythm: Normal rate and regular rhythm.  Pulmonary:     Effort: Pulmonary effort is normal.     Breath sounds: Normal breath sounds.  Chest:       Comments: Right outer upper quadrant mass Abdominal:     General: Bowel sounds are normal.     Palpations: Abdomen is soft.  Musculoskeletal:        General: Normal range of motion.     Cervical back: Normal range of motion.  Skin:    General: Skin is warm and dry.  Neurological:     General: No focal deficit present.     Mental Status: She is alert and oriented to person, place, and time.  Psychiatric:        Mood and Affect: Mood normal.        Behavior: Behavior normal.        Thought Content: Thought content normal.        Judgment: Judgment normal.     Labs reviewed: Basic Metabolic Panel: No results for input(s): "NA", "K", "CL", "CO2", "GLUCOSE", "BUN", "CREATININE", "CALCIUM", "MG", "PHOS", "TSH" in the last 8760  hours. Liver Function Tests: No results for input(s): "AST", "ALT", "ALKPHOS", "BILITOT", "PROT", "ALBUMIN" in the last 8760 hours. No results for input(s): "LIPASE", "AMYLASE" in the last 8760 hours. No results for input(s): "AMMONIA" in the last 8760 hours. CBC: No results for input(s): "WBC", "NEUTROABS", "HGB", "HCT", "MCV", "PLT" in the last 8760 hours. Lipid Panel: No results for input(s): "CHOL", "HDL", "LDLCALC", "TRIG", "CHOLHDL", "LDLDIRECT" in the last  8760 hours. Lab Results  Component Value Date   HGBA1C 6.8 (H) 05/30/2020    Procedures since last visit: DG Hip Unilat W or Wo Pelvis 2-3 Views Right  Result Date: 04/25/2022 CLINICAL DATA:  Right hip pain. EXAM: DG HIP (WITH OR WITHOUT PELVIS) 2-3V RIGHT COMPARISON:  07/19/2020 FINDINGS: Both hips are normally located. Mild bilateral hip joint degenerative changes but no plain film evidence of fracture or AVN. The pubic symphysis and SI joints are intact. No pelvic fractures or bone lesions. IMPRESSION: 1. Mild bilateral hip joint degenerative changes. 2. No acute bony findings. Electronically Signed   By: Marijo Sanes M.D.   On: 04/25/2022 09:34    Assessment/Plan  1. Encounter to establish care -  established care with [place]  2. Other hyperlipidemia -  continue Atorvastatin - Lipid panel; Future  3. Essential hypertension -  BP 143/88 -  continue Atenolol, chlorthalidone and Benicar -  instructed to check BP daily, log and bring to next appointment - Complete Metabolic Panel with eGFR; Future - Lipid panel; Future  4. Type 2 diabetes mellitus with other specified complication, without long-term current use of insulin (HCC) -  continue Glipizide and Ozempic - Hemoglobin A1C; Future - Lipid panel; Future  5. Screening for STD (sexually transmitted disease) - Hepatitis C Antibody; Future - HIV antibody (with reflex); Future  6. Mass of upper outer quadrant of right breast -  non tender -  bilateral breast  mammogram - MM 3D DIAGNOSTIC MAMMOGRAM UNILATERAL RIGHT BREAST  7. Morbid obesity with BMI of 50.0-59.9, adult (HCC) Body mass index is 57.14 kg/m. -  counseled  -  she stated that she will plans to start exercising at least 150 mins/week  8. Chronic bilateral low back pain with right-sided sciatica -  continue Advil PRN     Labs/tests ordered:  lipid panel, CMP, CBC, HIV, hepatitis C antibody, A1C  Next appt:  in a month

## 2022-05-07 ENCOUNTER — Other Ambulatory Visit: Payer: Self-pay | Admitting: Adult Health

## 2022-05-07 DIAGNOSIS — N6311 Unspecified lump in the right breast, upper outer quadrant: Secondary | ICD-10-CM

## 2022-05-08 ENCOUNTER — Other Ambulatory Visit: Payer: Self-pay

## 2022-05-08 DIAGNOSIS — N6310 Unspecified lump in the right breast, unspecified quadrant: Secondary | ICD-10-CM

## 2022-05-08 DIAGNOSIS — I10 Essential (primary) hypertension: Secondary | ICD-10-CM

## 2022-05-08 DIAGNOSIS — N6311 Unspecified lump in the right breast, upper outer quadrant: Secondary | ICD-10-CM

## 2022-05-08 DIAGNOSIS — Z113 Encounter for screening for infections with a predominantly sexual mode of transmission: Secondary | ICD-10-CM

## 2022-05-08 DIAGNOSIS — E1169 Type 2 diabetes mellitus with other specified complication: Secondary | ICD-10-CM

## 2022-05-09 LAB — COMPLETE METABOLIC PANEL WITH GFR
AG Ratio: 1.4 (calc) (ref 1.0–2.5)
ALT: 24 U/L (ref 6–29)
AST: 20 U/L (ref 10–35)
Albumin: 4.1 g/dL (ref 3.6–5.1)
Alkaline phosphatase (APISO): 93 U/L (ref 37–153)
BUN: 20 mg/dL (ref 7–25)
CO2: 28 mmol/L (ref 20–32)
Calcium: 9.8 mg/dL (ref 8.6–10.4)
Chloride: 103 mmol/L (ref 98–110)
Creat: 0.71 mg/dL (ref 0.50–1.05)
Globulin: 3 g/dL (calc) (ref 1.9–3.7)
Glucose, Bld: 303 mg/dL — ABNORMAL HIGH (ref 65–99)
Potassium: 4 mmol/L (ref 3.5–5.3)
Sodium: 138 mmol/L (ref 135–146)
Total Bilirubin: 0.6 mg/dL (ref 0.2–1.2)
Total Protein: 7.1 g/dL (ref 6.1–8.1)
eGFR: 97 mL/min/{1.73_m2} (ref >=60)

## 2022-05-09 LAB — LIPID PANEL
Cholesterol: 185 mg/dL (ref <200)
HDL: 52 mg/dL (ref >=50)
LDL Cholesterol (Calc): 114 mg/dL (calc) — ABNORMAL HIGH
Non-HDL Cholesterol (Calc): 133 mg/dL (calc) — ABNORMAL HIGH (ref <130)
Total CHOL/HDL Ratio: 3.6 (calc) (ref <5.0)
Triglycerides: 90 mg/dL (ref <150)

## 2022-05-09 LAB — HEPATITIS C ANTIBODY: Hepatitis C Ab: NONREACTIVE

## 2022-05-09 LAB — HEMOGLOBIN A1C
Hgb A1c MFr Bld: 8.7 % of total Hgb — ABNORMAL HIGH (ref <5.7)
Mean Plasma Glucose: 203 mg/dL
eAG (mmol/L): 11.2 mmol/L

## 2022-05-09 LAB — HIV ANTIBODY (ROUTINE TESTING W REFLEX): HIV 1&2 Ab, 4th Generation: NONREACTIVE

## 2022-05-09 NOTE — Progress Notes (Signed)
-    A1c 8.7, up from 6.8 a year ago -  LDL 114, high (normal <100), but down from 138 (taken 2020) -  electrolytes, liver enzymes, kidney function, all normal -  hep c and HIV negative

## 2022-05-16 ENCOUNTER — Other Ambulatory Visit: Payer: Self-pay

## 2022-05-16 DIAGNOSIS — E782 Mixed hyperlipidemia: Secondary | ICD-10-CM

## 2022-05-16 DIAGNOSIS — E1165 Type 2 diabetes mellitus with hyperglycemia: Secondary | ICD-10-CM

## 2022-05-16 MED ORDER — ATORVASTATIN CALCIUM 40 MG PO TABS: 40.0000 mg | ORAL_TABLET | ORAL | 1 refills | Status: DC

## 2022-05-16 MED ORDER — GLIPIZIDE ER 2.5 MG PO TB24: 2.5000 mg | ORAL_TABLET | Freq: Every day | ORAL | 1 refills | Status: DC

## 2022-05-16 NOTE — Telephone Encounter (Signed)
Patient request refill on medications.

## 2022-05-30 ENCOUNTER — Ambulatory Visit: Payer: Self-pay | Admitting: Hematology and Oncology

## 2022-05-30 ENCOUNTER — Ambulatory Visit: Payer: Managed Care, Other (non HMO)

## 2022-05-30 ENCOUNTER — Encounter: Payer: Self-pay | Admitting: Gastroenterology

## 2022-05-30 ENCOUNTER — Ambulatory Visit
Admission: RE | Admit: 2022-05-30 | Discharge: 2022-05-30 | Disposition: A | Discharge status: Home / Self Care | Payer: No Typology Code available for payment source | Source: Ambulatory Visit | Attending: Obstetrics and Gynecology | Admitting: Obstetrics and Gynecology

## 2022-05-30 VITALS — BP 129/72 | Wt 338.0 lb

## 2022-05-30 DIAGNOSIS — N6310 Unspecified lump in the right breast, unspecified quadrant: Secondary | ICD-10-CM

## 2022-05-30 NOTE — Patient Instructions (Signed)
Taught Ruth [name] about self breast awareness and gave educational materials to take home. Patient did not need a Pap smear today due to hysterectomy. Referred patient to the [facility] for diagnostic mammogram. Appointment scheduled for 05/30/22. Patient aware of appointment and will be there. Let patient know will follow up with her within the next couple weeks with results. Ruth [name] verbalized understanding.  Ruth Lux, NP 1:00 PM

## 2022-05-30 NOTE — Progress Notes (Signed)
Ms. Ruth [name] is a 62 y.o. female who presents to [facility] clinic today with complaint of right breast nodule.    Pap Smear: Pap not smear completed today due to hysterectomy for benign reasons.    Physical exam: Breasts Breasts symmetrical. No skin abnormalities bilateral breasts. No nipple retraction bilateral breasts. No nipple discharge bilateral breasts. No lymphadenopathy. No lumps palpated left breasts. Right breast with nodule noted in the upper inner quadrant.     Pelvic/Bimanual Pap is not indicated today    Smoking History: Patient has is a former smoker and was not referred to quit line.    Patient Navigation: Patient education provided. Access to services provided for patient through [facility] program. No interpreter provided. No transportation provided   Colorectal Cancer Screening: Per patient has had colonoscopy completed on 10/29/16 with tubular adenoma noted in the ileocecal valve and the rectum.  No complaints today. She will follow with GI this month.    Breast and Cervical Cancer Risk Assessment: Patient does not have family history of breast cancer, known genetic mutations, or radiation treatment to the chest before age 51. Patient does not have history of cervical dysplasia, immunocompromised, or DES exposure in-utero.  Risk Scores as of 05/30/2022     Ruth [name]           5-year 1.49 %   Lifetime 6.71 %            Last calculated by Caprice Red, CMA on 05/30/2022 at 12:44 PM        A: [facility]CP exam without pap smear Complaint of right breast lump noted on exam.   P: Referred patient to the [facility] for a diagnostic mammogram. Appointment scheduled 05/30/22.  Pascal Lux, NP 05/30/2022 12:56 PM

## 2022-06-03 ENCOUNTER — Other Ambulatory Visit: Payer: Self-pay

## 2022-06-03 ENCOUNTER — Ambulatory Visit: Payer: Managed Care, Other (non HMO) | Admitting: Adult Health

## 2022-06-03 MED ORDER — CHLORTHALIDONE 25 MG PO TABS: 12.5000 mg | ORAL_TABLET | Freq: Every morning | ORAL | 0 refills | Status: DC

## 2022-06-03 NOTE — Telephone Encounter (Signed)
VERY HIGH warning came up when trying to refill medication.   Medication pended and sent to Kenard Gower, NP

## 2022-06-06 ENCOUNTER — Ambulatory Visit (INDEPENDENT_AMBULATORY_CARE_PROVIDER_SITE_OTHER): Payer: Self-pay | Admitting: Adult Health

## 2022-06-06 ENCOUNTER — Encounter: Payer: Self-pay | Admitting: Adult Health

## 2022-06-06 VITALS — BP 138/88 | HR 66 | Temp 97.7°F | Resp 18 | Ht 65.0 in | Wt 340.5 lb

## 2022-06-06 DIAGNOSIS — E7849 Other hyperlipidemia: Secondary | ICD-10-CM

## 2022-06-06 DIAGNOSIS — I10 Essential (primary) hypertension: Secondary | ICD-10-CM

## 2022-06-06 DIAGNOSIS — E1169 Type 2 diabetes mellitus with other specified complication: Secondary | ICD-10-CM

## 2022-06-06 DIAGNOSIS — B3731 Acute candidiasis of vulva and vagina: Secondary | ICD-10-CM

## 2022-06-06 MED ORDER — GLUCOSE BLOOD VI STRP: ORAL_STRIP | 12 refills | Status: DC

## 2022-06-06 MED ORDER — ACCU-CHEK GUIDE VI STRP: ORAL_STRIP | 12 refills | Status: DC

## 2022-06-06 MED ORDER — ACCU-CHEK GUIDE W/DEVICE KIT: 1.0000 | PACK | Freq: Every day | 0 refills | Status: DC

## 2022-06-06 MED ORDER — FLUCONAZOLE 150 MG PO TABS: 150.0000 mg | ORAL_TABLET | Freq: Once | ORAL | 0 refills | Status: AC

## 2022-06-06 NOTE — Progress Notes (Signed)
[facility] clinic  Provider:  Kenard Gower DNP  Code Status:   Full Code  Goals of Care:     05/06/2022    1:36 PM  Advanced Directives  Does Patient Have a Medical Advance Directive? No  Would patient like information on creating a medical advance directive? No - Patient declined     Chief Complaint  Patient presents with   Follow-up    Four Weeks follow up    HPI: Patient is a 62 y.o. female seen today for follow 1 month follow up. She was accompanied today by her husband. She complains of having whitish vaginal discharge and itching X 2 weeks. A recent mammogram showed a benign oil cyst in the upper outer quadrant of her right breast.  Type 2 diabetes mellitus with other specified complication, without long-term current use of insulin (HCC) - CBG not checked due to no glucose monitor, takes Glipizide  Essential hypertension -   BP 138/88, takes Atenolol, Chlorthalidone and Olmesartan  Other hyperlipidemia -  takes Atorvastatin   Wt Readings from Last 3 Encounters:  06/06/22 (!) 340 lb 8 oz (154.4 kg)  05/30/22 (!) 338 lb (153.3 kg)  05/06/22 (!) 343 lb 6 oz (155.8 kg)      Past Medical History:  Diagnosis Date   Anemia    Arthritis    Back pain    Bilateral calcaneal spurs    Diabetes mellitus without complication    type 2   GERD (gastroesophageal reflux disease)    Gout    Hyperlipidemia    Hypertension    Obesity    Osteoarthritis    Palpitations    Swallowing difficulty    Vitamin D deficiency     Past Surgical History:  Procedure Laterality Date   ABDOMINAL HYSTERECTOMY     partial   CHOLECYSTECTOMY     COLONOSCOPY WITH PROPOFOL N/A 10/29/2016   Procedure: COLONOSCOPY WITH PROPOFOL;  Surgeon: Benancio Deeds, MD;  Location: [facility];  Service: Gastroenterology;  Laterality: N/A;   DENTAL SURGERY     ORIF TIBIA & FIBULA FRACTURES Left     Allergies  Allergen Reactions   Lisinopril Swelling    Throat swelling    Hydrochlorothiazide     Other Reaction(s): sleepy   Tape Other (See Comments)    Certain Band aids cause skin irritation    Outpatient Encounter Medications as of 06/06/2022  Medication Sig   atenolol (TENORMIN) 50 MG tablet TAKE 1 TABLET(50 MG) BY MOUTH DAILY   atorvastatin (LIPITOR) 40 MG tablet Take 1 tablet (40 mg total) by mouth 3 (three) times a week.   chlorthalidone (HYGROTON) 25 MG tablet Take 0.5 tablets (12.5 mg total) by mouth every morning.   glipiZIDE (GLUCOTROL XL) 2.5 MG 24 hr tablet Take 1 tablet (2.5 mg total) by mouth daily with breakfast. With heaviest meals   olmesartan (BENICAR) 40 MG tablet Take 40 mg by mouth daily.   No facility-administered encounter medications on file as of 06/06/2022.    Review of Systems:  Review of Systems  Constitutional:  Negative for appetite change, chills, fatigue and fever.  HENT:  Negative for congestion, hearing loss, rhinorrhea and sore throat.   Eyes: Negative.   Respiratory:  Negative for cough, shortness of breath and wheezing.   Cardiovascular:  Negative for chest pain, palpitations and leg swelling.  Gastrointestinal:  Negative for abdominal pain, constipation, diarrhea, nausea and vomiting.  Genitourinary:  Positive for vaginal discharge. Negative for dysuria.  Vaginal itch  Musculoskeletal:  Negative for arthralgias, back pain and myalgias.  Skin:  Negative for color change, rash and wound.  Neurological:  Negative for dizziness, weakness and headaches.  Psychiatric/Behavioral:  Negative for behavioral problems. The patient is not nervous/anxious.     Health Maintenance  Topic Date Due   OPHTHALMOLOGY EXAM  Never done   Lung Cancer Screening  Never done   PAP SMEAR-Modifier  08/22/2020   Diabetic kidney evaluation - Urine ACR  09/23/2020   FOOT EXAM  07/13/2021   COVID-19 Vaccine (4 - 2023-24 season) 10/12/2021   Zoster Vaccines- Shingrix (1 of 2) 09/05/2022 (Originally 10/15/1979)   INFLUENZA VACCINE   09/12/2022   HEMOGLOBIN A1C  11/08/2022   Diabetic kidney evaluation - eGFR measurement  05/08/2023   MAMMOGRAM  08/02/2023   COLONOSCOPY (Pts 45-56yrs Insurance coverage will need to be confirmed)  10/30/2026   DTaP/Tdap/Td (2 - Td or Tdap) 10/25/2028   Hepatitis C Screening  Completed   HIV Screening  Completed   HPV VACCINES  Aged Out    Physical Exam: Vitals:   06/06/22 1425  BP: 138/88  Pulse: 66  Resp: 18  Temp: 97.7 F (36.5 C)  SpO2: 96%  Weight: (!) 340 lb 8 oz (154.4 kg)  Height: 5\' 5"  (1.651 m)   Body mass index is 56.66 kg/m. Physical Exam Constitutional:      Appearance: She is obese.     Comments: Morbidly obese  HENT:     Head: Normocephalic and atraumatic.     Nose: Nose normal.     Mouth/Throat:     Mouth: Mucous membranes are moist.  Eyes:     Conjunctiva/sclera: Conjunctivae normal.  Cardiovascular:     Rate and Rhythm: Normal rate and regular rhythm.  Pulmonary:     Effort: Pulmonary effort is normal.     Breath sounds: Normal breath sounds.  Abdominal:     General: Bowel sounds are normal.     Palpations: Abdomen is soft.  Musculoskeletal:        General: Normal range of motion.     Cervical back: Normal range of motion.  Skin:    General: Skin is warm and dry.  Neurological:     General: No focal deficit present.     Mental Status: She is alert and oriented to person, place, and time.  Psychiatric:        Mood and Affect: Mood normal.        Behavior: Behavior normal.        Thought Content: Thought content normal.        Judgment: Judgment normal.     Labs reviewed: Basic Metabolic Panel: Recent Labs    05/08/22 0913  NA 138  K 4.0  CL 103  CO2 28  GLUCOSE 303*  BUN 20  CREATININE 0.71  CALCIUM 9.8   Liver Function Tests: Recent Labs    05/08/22 0913  AST 20  ALT 24  BILITOT 0.6  PROT 7.1   No results for input(s): "LIPASE", "AMYLASE" in the last 8760 hours. No results for input(s): "AMMONIA" in the last 8760  hours. CBC: No results for input(s): "WBC", "NEUTROABS", "HGB", "HCT", "MCV", "PLT" in the last 8760 hours. Lipid Panel: Recent Labs    05/08/22 0913  CHOL 185  HDL 52  LDLCALC 114*  TRIG 90  CHOLHDL 3.6   Lab Results  Component Value Date   HGBA1C 8.7 (H) 05/08/2022    Procedures since last  visit: MS 3D DIAG MAMMO UNI RT BR (aka MM)  Result Date: 05/30/2022 CLINICAL DATA:  62 year old presenting with a possible palpable lump in the UPPER OUTER QUADRANT of the RIGHT breast. EXAM: DIGITAL DIAGNOSTIC UNILATERAL RIGHT MAMMOGRAM WITH TOMOSYNTHESIS TECHNIQUE: Right digital diagnostic mammography and breast tomosynthesis was performed. COMPARISON:  Previous exam(s). ACR Breast Density Category a: The breasts are almost entirely fatty. FINDINGS: Full field CC and MLO views and a spot tangential view of the palpable concern were obtained. Corresponding to the palpable concern is a benign oil cyst in the UPPER OUTER QUADRANT at middle depth measuring approximately 1.5 cm, with early dystrophic calcification involving the cyst wall. No findings suspicious for malignancy. IMPRESSION: 1. No mammographic evidence of malignancy involving the RIGHT breast. 2. Benign oil cyst in the UPPER OUTER QUADRANT which accounts for the palpable concern. RECOMMENDATION: Annual BILATERAL screening mammography which is due in June, 2024. I have discussed the findings and recommendations with the patient. If applicable, a reminder letter will be sent to the patient regarding the next appointment. BI-RADS CATEGORY  2: Benign. Electronically Signed   By: Hulan Saas M.D.   On: 05/30/2022 15:00   Assessment/Plan  1. Vaginal yeast infection - fluconazole (DIFLUCAN) 150 MG tablet; Take 1 tablet (150 mg total) by mouth once for 1 dose.  Dispense: 1 tablet; Refill: 0  2. Type 2 diabetes mellitus with other specified complication, without long-term current use of insulin (HCC) Lab Results  Component Value Date    HGBA1C 8.7 (H) 05/08/2022   -  continue Glipizide -  check CBG daily, lof and bring to next appointment - Blood Glucose Monitoring Suppl (ACCU-CHEK GUIDE) w/Device KIT; 1 Device by Does not apply route daily. CBG daily, pls give strips and lancets  Dispense: 1 kit; Refill: 0 - glucose blood (ACCU-CHEK GUIDE) test strip; Use as instructed  Dispense: 100 each; Refill: 12  3. Essential hypertension -  BP 138/78, stable -  continue Atenolol, Chlorthalidone and Olmesartan  4. Other hyperlipidemia Lab Results  Component Value Date   CHOL 185 05/08/2022   HDL 52 05/08/2022   LDLCALC 114 (H) 05/08/2022   TRIG 90 05/08/2022   CHOLHDL 3.6 05/08/2022   -  continue Atorvastatin  5. Morbidly obese (HCC) -  plans to lose 10 lbs in 6 months    Labs/tests ordered:  None  Next appt:  Visit date not found

## 2022-06-28 ENCOUNTER — Other Ambulatory Visit: Payer: Self-pay | Admitting: Adult Health

## 2022-06-28 DIAGNOSIS — Z1231 Encounter for screening mammogram for malignant neoplasm of breast: Secondary | ICD-10-CM

## 2022-07-11 ENCOUNTER — Encounter: Payer: Self-pay | Admitting: Adult Health

## 2022-07-11 ENCOUNTER — Ambulatory Visit: Payer: Medicaid Other | Admitting: Adult Health

## 2022-07-11 VITALS — BP 144/80 | HR 88 | Temp 97.1°F | Resp 18 | Ht 65.0 in | Wt 336.2 lb

## 2022-07-11 DIAGNOSIS — I10 Essential (primary) hypertension: Secondary | ICD-10-CM | POA: Diagnosis not present

## 2022-07-11 DIAGNOSIS — Z124 Encounter for screening for malignant neoplasm of cervix: Secondary | ICD-10-CM

## 2022-07-11 DIAGNOSIS — B3731 Acute candidiasis of vulva and vagina: Secondary | ICD-10-CM

## 2022-07-11 DIAGNOSIS — E7849 Other hyperlipidemia: Secondary | ICD-10-CM

## 2022-07-11 DIAGNOSIS — E1169 Type 2 diabetes mellitus with other specified complication: Secondary | ICD-10-CM | POA: Diagnosis not present

## 2022-07-11 MED ORDER — FLUCONAZOLE 150 MG PO TABS: 150.0000 mg | ORAL_TABLET | ORAL | 0 refills | Status: AC

## 2022-07-11 MED ORDER — GLIPIZIDE ER 10 MG PO TB24: 10.0000 mg | ORAL_TABLET | Freq: Every day | ORAL | 5 refills | Status: DC

## 2022-07-11 NOTE — Progress Notes (Addendum)
[facility] clinic  Provider:  Kenard Gower DNP  Code Status:  Full Code  Goals of Care:     07/11/2022    2:10 PM  Advanced Directives  Does Patient Have a Medical Advance Directive? No  Would patient like information on creating a medical advance directive? No - Patient declined     Chief Complaint  Patient presents with   Medical Management of Chronic Issues    1 month follow up.   Health Maintenance    Discuss the need for Eye exam, Pap smear, Lung cancer screening, Foot exam, and Diabetic kidney evaluation.    Immunizations    Discuss the need for [employer].    HPI: Patient is a 62 y.o. female seen today for 1 month follow up. She complains of having vaginal itch with thick whitish discharge. She stated that she had partial hysterectomy in 1983. She is diabetic and currently taking Glipizide 2.5 mg daily. Latest A1C 8.7, 05/08/22.  BP 144/80, takes Atenolol, Olmesartan and Hygroton for hypertension.    Wt Readings from Last 3 Encounters:  07/11/22 (!) 336 lb 3.2 oz (152.5 kg)  06/06/22 (!) 340 lb 8 oz (154.4 kg)  05/30/22 (!) 338 lb (153.3 kg)     Past Medical History:  Diagnosis Date   Anemia    Arthritis    Back pain    Bilateral calcaneal spurs    Diabetes mellitus without complication (HCC)    type 2   GERD (gastroesophageal reflux disease)    Gout    Hyperlipidemia    Hypertension    Obesity    Osteoarthritis    Palpitations    Swallowing difficulty    Vitamin D deficiency     Past Surgical History:  Procedure Laterality Date   ABDOMINAL HYSTERECTOMY     partial   CHOLECYSTECTOMY     COLONOSCOPY WITH PROPOFOL N/A 10/29/2016   Procedure: COLONOSCOPY WITH PROPOFOL;  Surgeon: Benancio Deeds, MD;  Location: [facility];  Service: Gastroenterology;  Laterality: N/A;   DENTAL SURGERY     ORIF TIBIA & FIBULA FRACTURES Left     Allergies  Allergen Reactions   Lisinopril Swelling    Throat swelling   Hydrochlorothiazide     Other  Reaction(s): sleepy   Tape Other (See Comments)    Certain Band aids cause skin irritation    Outpatient Encounter Medications as of 07/11/2022  Medication Sig   atenolol (TENORMIN) 50 MG tablet TAKE 1 TABLET(50 MG) BY MOUTH DAILY   atorvastatin (LIPITOR) 40 MG tablet Take 1 tablet (40 mg total) by mouth 3 (three) times a week.   Blood Glucose Monitoring Suppl (ACCU-CHEK GUIDE) w/Device KIT 1 Device by Does not apply route daily. CBG daily, pls give strips and lancets   chlorthalidone (HYGROTON) 25 MG tablet Take 0.5 tablets (12.5 mg total) by mouth every morning.   glipiZIDE (GLUCOTROL XL) 2.5 MG 24 hr tablet Take 1 tablet (2.5 mg total) by mouth daily with breakfast. With heaviest meals   glucose blood (ACCU-CHEK GUIDE) test strip Use as instructed   olmesartan (BENICAR) 40 MG tablet Take 40 mg by mouth daily.   No facility-administered encounter medications on file as of 07/11/2022.    Review of Systems:  Review of Systems  Constitutional:  Negative for appetite change, chills, fatigue and fever.  HENT:  Negative for congestion, hearing loss, rhinorrhea and sore throat.   Eyes: Negative.   Respiratory:  Negative for cough, shortness of breath and wheezing.  Cardiovascular:  Negative for chest pain, palpitations and leg swelling.  Gastrointestinal:  Negative for abdominal pain, constipation, diarrhea, nausea and vomiting.  Genitourinary:  Positive for vaginal discharge. Negative for dysuria.       Has vaginal itch  Musculoskeletal:  Negative for arthralgias, back pain and myalgias.  Skin:  Negative for color change, rash and wound.  Neurological:  Negative for dizziness, weakness and headaches.  Psychiatric/Behavioral:  Negative for behavioral problems. The patient is not nervous/anxious.     Health Maintenance  Topic Date Due   OPHTHALMOLOGY EXAM  Never done   Lung Cancer Screening  Never done   PAP SMEAR-Modifier  08/22/2020   Diabetic kidney evaluation - Urine ACR   09/23/2020   FOOT EXAM  07/13/2021   COVID-19 Vaccine (4 - 2023-24 season) 10/12/2021   Zoster Vaccines- Shingrix (1 of 2) 09/05/2022 (Originally 10/15/1979)   INFLUENZA VACCINE  09/12/2022   HEMOGLOBIN A1C  11/08/2022   Diabetic kidney evaluation - eGFR measurement  05/08/2023   MAMMOGRAM  08/02/2023   Colonoscopy  10/30/2026   DTaP/Tdap/Td (2 - Td or Tdap) 10/25/2028   Hepatitis C Screening  Completed   HIV Screening  Completed   HPV VACCINES  Aged Out    Physical Exam: Vitals:   07/11/22 1359  BP: (!) 144/80  Pulse: 88  Resp: 18  Temp: (!) 97.1 F (36.2 C)  SpO2: 93%  Weight: (!) 336 lb 3.2 oz (152.5 kg)  Height: 5\' 5"  (1.651 m)   Body mass index is 55.95 kg/m. Physical Exam Constitutional:      Appearance: She is obese.     Comments: Morbidly obese  HENT:     Head: Normocephalic and atraumatic.     Nose: Nose normal.     Mouth/Throat:     Mouth: Mucous membranes are moist.  Eyes:     Conjunctiva/sclera: Conjunctivae normal.  Cardiovascular:     Rate and Rhythm: Normal rate and regular rhythm.  Pulmonary:     Effort: Pulmonary effort is normal.     Breath sounds: Normal breath sounds.  Abdominal:     General: Bowel sounds are normal.     Palpations: Abdomen is soft.  Musculoskeletal:        General: Normal range of motion.     Cervical back: Normal range of motion.  Skin:    General: Skin is warm and dry.  Neurological:     General: No focal deficit present.     Mental Status: She is alert and oriented to person, place, and time.  Psychiatric:        Mood and Affect: Mood normal.        Behavior: Behavior normal.        Thought Content: Thought content normal.        Judgment: Judgment normal.     Labs reviewed: Basic Metabolic Panel: Recent Labs    05/08/22 0913  NA 138  K 4.0  CL 103  CO2 28  GLUCOSE 303*  BUN 20  CREATININE 0.71  CALCIUM 9.8   Liver Function Tests: Recent Labs    05/08/22 0913  AST 20  ALT 24  BILITOT 0.6  PROT  7.1   No results for input(s): "LIPASE", "AMYLASE" in the last 8760 hours. No results for input(s): "AMMONIA" in the last 8760 hours. CBC: No results for input(s): "WBC", "NEUTROABS", "HGB", "HCT", "MCV", "PLT" in the last 8760 hours. Lipid Panel: Recent Labs    05/08/22 0913  CHOL 185  HDL 52  LDLCALC 114*  TRIG 90  CHOLHDL 3.6   Lab Results  Component Value Date   HGBA1C 8.7 (H) 05/08/2022    Procedures since last visit: No results found.  Assessment/Plan  .1. Type 2 diabetes mellitus with other specified complication, without long-term current use of insulin (HCC) Lab Results  Component Value Date   HGBA1C 8.7 (H) 05/08/2022   A1C elevated -  will increase Glucotrol and start on Januvia - Microalbumin/Creatinine Ratio, Urine - glipiZIDE (GLUCOTROL XL) 10 MG 24 hr tablet; Take 1 tablet (10 mg total) by mouth daily with breakfast.  Dispense: 30 tablet; Refill: 5 - sitaGLIPtin (JANUVIA) 50 MG tablet; Take 1 tablet (50 mg total) by mouth daily.  Dispense: 30 tablet; Refill: 3 DISCONTINUED -  check CBG daily, record and bring to next appointment  -07/22/22 Addendum -  patient called stating she wanted the Rybelsus instead of Januvia -  called patient today via telephone and discussed that she does not need to start Januvia and will send prescription to pharmacy for her Semaglutide 0.25 mg injection weekly.   07/24/22 Addendum -  patient requested Rybelsus tablet since injection is not covered -  will start patient on Rybelsus 3 mg tab PO daily  08/02/22 Addendum -  Rybelsus ordered since she used Metformin and Lantus in the past but failed to control type 2 diabetes mellitus (ICD10 E11.8). She is 62 years old   2. Vaginal yeast infection - fluconazole (DIFLUCAN) 150 MG tablet; Take 1 tablet (150 mg total) by mouth 3 days for 2 doses.  Dispense: 2 tablet; Refill: 0  3. Cervical cancer screening - Ambulatory referral to Obstetrics / Gynecology  4. Essential  hypertension -  BP 144/80, stable -  continue Atenolol, Olmesartan and Hygroton  5. Morbidly obese (HCC) -  counseled regarding diet and exercise - Amb Ref to Medical Weight Management  6. Other hyperlipidemia Lab Results  Component Value Date   CHOL 185 05/08/2022   HDL 52 05/08/2022   LDLCALC 114 (H) 05/08/2022   TRIG 90 05/08/2022   CHOLHDL 3.6 05/08/2022   -  continue Atorvastatin    Labs/tests ordered:   urine microalbumin  Next appt:  Visit date not found

## 2022-07-12 LAB — MICROALBUMIN / CREATININE URINE RATIO
Creatinine, Urine: 86 mg/dL (ref 20–275)
Microalb Creat Ratio: 10 mg/g creat (ref <30)
Microalb, Ur: 0.9 mg/dL

## 2022-07-12 NOTE — Progress Notes (Addendum)
-    urine microalbumin is normal

## 2022-07-17 ENCOUNTER — Telehealth: Payer: Self-pay

## 2022-07-17 ENCOUNTER — Encounter (INDEPENDENT_AMBULATORY_CARE_PROVIDER_SITE_OTHER): Payer: Medicaid Other | Admitting: Family Medicine

## 2022-07-17 DIAGNOSIS — Z0289 Encounter for other administrative examinations: Secondary | ICD-10-CM

## 2022-07-17 LAB — HM DIABETES EYE EXAM

## 2022-07-17 MED ORDER — SITAGLIPTIN PHOSPHATE 50 MG PO TABS: 50.0000 mg | ORAL_TABLET | Freq: Every day | ORAL | 3 refills | Status: DC

## 2022-07-17 NOTE — Telephone Encounter (Signed)
-    Ozempic is not covered by her insurance. Sent prescription for Januvia to her pharmacy. She needs to check her CBG daily, record and bring to next appointment.

## 2022-07-17 NOTE — Telephone Encounter (Signed)
Tried calling patient, no answer, left message for patient to call office when available

## 2022-07-17 NOTE — Telephone Encounter (Signed)
Patient called stating that the glipizide isn't working to control her blood sugars. She says that blood sugar this morning before taking medication was 350 she states that after taking medication it came down to 318. Her blood sugars have been remaining above 300. She sates that when she was on the ozempic her blood sugars were in the 150s. She would like to know if she needs to go back on the ozempic or take metformin. Please advise.  Message sent to Kenard Gower, NP

## 2022-07-17 NOTE — Telephone Encounter (Signed)
Patient returned call. Informed patient about medication. She verbalized her understanding

## 2022-07-18 ENCOUNTER — Telehealth: Payer: Self-pay

## 2022-07-18 NOTE — Telephone Encounter (Signed)
Great. Pls notify patient. Thanks.

## 2022-07-18 NOTE — Telephone Encounter (Signed)
PA request received from pharmacy for Januvia. Pa initiated through covermymeds and waiting on reply.  Message sent to Kenard Gower, NP

## 2022-07-18 NOTE — Telephone Encounter (Signed)
PA was approved from 07/18/22-07/18/23 nfor all strengths of [place]. Approval letter under media tab

## 2022-07-19 NOTE — Telephone Encounter (Signed)
Called patient and left message informing her of approval for medication

## 2022-07-22 ENCOUNTER — Telehealth: Payer: Self-pay

## 2022-07-22 MED ORDER — OZEMPIC (0.25 OR 0.5 MG/DOSE) 2 MG/3ML ~~LOC~~ SOPN: 0.2500 mg | PEN_INJECTOR | SUBCUTANEOUS | 0 refills | Status: DC

## 2022-07-22 NOTE — Addendum Note (Signed)
Addended by: Kenard Gower C on: 07/22/2022 04:54 PM   Modules accepted: Orders

## 2022-07-22 NOTE — Telephone Encounter (Signed)
Patient would like to know why ozempic was called in for her and not the rybelsus. She would prefer a pill not an injection and ozempic is not covered by her insurance. Please advise.

## 2022-07-22 NOTE — Telephone Encounter (Signed)
Patient called inquiring about Januvia she was just prescribed. Patient states that she would like to know about taking rybelsus. She stated that the rybelsus is a lower dose medication and seems that it might be more effective than Januvia. She stated that Rybelsus protects the kidneys as well. She says she would prefer to take Rybelsus. Please advise.  Message sent to Kenard Gower, NP

## 2022-07-22 NOTE — Telephone Encounter (Signed)
-    called patient via telephone and discussed to discontinue Januvia and sent prescription of Semaglutide to her pharmacy.

## 2022-07-23 ENCOUNTER — Telehealth: Payer: Self-pay

## 2022-07-23 NOTE — Telephone Encounter (Signed)
Incoming fax received from [employer] to initiate prior authorization for [employer]. Although the information was provided for PA to be completed via Covermymeds, attached was the PA forms.  Forms filled in as much as possible and placed in Medina-Vargas, Monina C, NP , folder on her desk, to further complete and then to the administrative staff for further processing (fax to [phone])

## 2022-07-23 NOTE — Telephone Encounter (Signed)
Noted  

## 2022-07-23 NOTE — Telephone Encounter (Signed)
Patient called to question why Medina-Vargas, Monina C, NP sent in a rx for Ozempic/generic version after they discussed that the cost is 900 dollars in which the patient "Can't afford."  Patient states she asked for a pill equivalent to ozempic. I reviewed previous note and informed patient that she spoke with [facility] personally about this yesterday and it appears she agree to the plan. Patient states "There was obviously a miscommunication."  Please advise

## 2022-07-24 ENCOUNTER — Telehealth: Payer: Self-pay

## 2022-07-24 MED ORDER — RYBELSUS 3 MG PO TABS: 3.0000 mg | ORAL_TABLET | Freq: Every day | ORAL | 0 refills | Status: DC

## 2022-07-24 NOTE — Telephone Encounter (Signed)
Changed formulary to Rybelsus tab instead of injection. Pls follow up in 4 weeks with CBG logs, so we can re-evaluate if dosage needs to be elevated.

## 2022-07-24 NOTE — Telephone Encounter (Signed)
PA request received for Rybelsus 3 mg tablet. Paperwork placed in urgent folder on desk to be completed and signed.  Message sent to Kenard Gower, NP

## 2022-07-24 NOTE — Addendum Note (Signed)
Addended by: Kenard Gower C on: 07/24/2022 09:47 AM   Modules accepted: Orders

## 2022-07-25 NOTE — Telephone Encounter (Signed)
PA signed and put in the for faxing box.

## 2022-07-30 ENCOUNTER — Ambulatory Visit (INDEPENDENT_AMBULATORY_CARE_PROVIDER_SITE_OTHER): Payer: Medicaid Other | Admitting: Family Medicine

## 2022-07-30 ENCOUNTER — Encounter (INDEPENDENT_AMBULATORY_CARE_PROVIDER_SITE_OTHER): Payer: Self-pay | Admitting: Family Medicine

## 2022-07-30 VITALS — BP 108/73 | HR 79 | Temp 98.0°F | Ht 64.0 in | Wt 335.0 lb

## 2022-07-30 DIAGNOSIS — Z6841 Body Mass Index (BMI) 40.0 and over, adult: Secondary | ICD-10-CM

## 2022-07-30 DIAGNOSIS — F32A Depression, unspecified: Secondary | ICD-10-CM

## 2022-07-30 DIAGNOSIS — E1159 Type 2 diabetes mellitus with other circulatory complications: Secondary | ICD-10-CM | POA: Diagnosis not present

## 2022-07-30 DIAGNOSIS — I152 Hypertension secondary to endocrine disorders: Secondary | ICD-10-CM

## 2022-07-30 DIAGNOSIS — R0602 Shortness of breath: Secondary | ICD-10-CM | POA: Diagnosis not present

## 2022-07-30 DIAGNOSIS — E1169 Type 2 diabetes mellitus with other specified complication: Secondary | ICD-10-CM

## 2022-07-30 DIAGNOSIS — E66813 Obesity, class 3: Secondary | ICD-10-CM

## 2022-07-30 DIAGNOSIS — R5383 Other fatigue: Secondary | ICD-10-CM | POA: Diagnosis not present

## 2022-07-30 DIAGNOSIS — Z7985 Long-term (current) use of injectable non-insulin antidiabetic drugs: Secondary | ICD-10-CM

## 2022-07-30 DIAGNOSIS — E1165 Type 2 diabetes mellitus with hyperglycemia: Secondary | ICD-10-CM | POA: Diagnosis not present

## 2022-07-30 DIAGNOSIS — Z1331 Encounter for screening for depression: Secondary | ICD-10-CM

## 2022-07-30 DIAGNOSIS — E785 Hyperlipidemia, unspecified: Secondary | ICD-10-CM

## 2022-07-30 DIAGNOSIS — E559 Vitamin D deficiency, unspecified: Secondary | ICD-10-CM | POA: Diagnosis not present

## 2022-07-30 NOTE — Progress Notes (Signed)
Chief Complaint:   OBESITY Ruth [name] (MR# [id number]) is a 62 y.o. female who presents for evaluation and treatment of obesity and related comorbidities. Current BMI is Body mass index is 57.5 kg/m. Keren has been struggling with her weight for many years and has been unsuccessful in either losing weight, maintaining weight loss, or reaching her healthy weight goal.  Nagma is currently in the action stage of change and ready to dedicate time achieving and maintaining a healthier weight. Syndey is interested in becoming our patient and working on intensive lifestyle modifications including (but not limited to) diet and exercise for weight loss.  Returning patient- previously saw Dr. Manson Passey.  Lost job and is now currently retired so making appointments will not be an issue.  Lives with daughter, son in law and grandson and husband Steve Rattler, Farrel Demark).  Family is supportive of her and eats meals together and will be changing how they all eat to be in line with what patient is doing. Desired weight is 160lb; last time she was that weight was 20 or more years ago.  Felt that after her last child she struggled with weight gain and then couldn't lose.  Lost 50lbs previously around 2000 with slimfast.  Previously worked out a bit but has stopped exercising.  Eats out 3-4 times a week; [place] chicken wings, [employer], [place]. Patient is not a picky eat; doesn't like beets, peas, some beans.Tends to crave sweets.  Food Recall: Coffee in am with hazelnut creamer with 2 tsp and then boiled egg (1-2) and a few slices of bacon.  Feels satisfied.  Occasionally will eat some watermelon or granola bar if she wants a snack.  Lunch is between 12-3 and will be something like chicken wings- just meat of around 5 flats.  Feels satisfied from this. Dinner is around 7pm and may eat rice (1 cup) with meat (1 pork chop or 2 legs of chicken) and sometimes vegetable like salad with lettuce,  tomato, dried cranberries, 1 tbsp of dressing. May do beans and rice can.  After dinner she may have some chips (small bag) or cookies (4 cookies) or ice cream/ sherbert (1-2 cups).  She craves sweets after supper.   Sibbie's habits were reviewed today and are as follows: Her family eats meals together, she thinks her family will eat healthier with her, her desired weight loss is 175 lbs, she started gaining weight in 2005, her heaviest weight ever was 340 pounds, she has significant food cravings issues, she snacks frequently in the evenings, and she struggles with emotional eating.  Depression Screen Cydney's Food and Mood (modified PHQ-9) score was 15.  Subjective:   1. Other fatigue Mechell admits to daytime somnolence and admits to waking up still tired. Patient has a history of symptoms of daytime fatigue and morning fatigue. Shuree generally gets 5 or 6 hours of sleep per night, and states that she has nightime awakenings and generally restful sleep. Snoring is present. Apneic episodes are not present. Epworth Sleepiness Score is 7.  EKG-normal sinus rhythm at 84 bpm.  2. SOBOE (shortness of breath on exertion) Brayton Caves notes increasing shortness of breath with exercising and seems to be worsening over time with weight gain. She notes getting out of breath sooner with activity than she used to. This has not gotten worse recently. Brinlee denies shortness of breath at rest or orthopnea.  Previous IC 1967 on 09/07/2018.  3. Hypertension associated with type 2 diabetes  mellitus (HCC) Patient is on olmesartan, chlorthalidone, and atenolol.  Her blood pressure is controlled today. Patient voices her blood pressure has been consistently elevated prior to this appointment.  4. Type 2 diabetes mellitus with hyperglycemia, without long-term current use of insulin (HCC) Patient is on glipizide, and her last A1c was 8.7.  Her last eye exam was approximately 1 to 2 weeks ago.  She has a prescription for  Rybelsus but she has not picked it up; needs prior authorization.  5. Hyperlipidemia associated with type 2 diabetes mellitus (HCC) Patient is on a atorvastatin daily.  Her last LDL was 114, HDL 52, and triglycerides 90 less than 3 months ago.  6. Vitamin D deficiency Patient is not on vitamin D anymore, and she notes fatigue.  Assessment/Plan:   1. Other fatigue Kaylyn does feel that her weight is causing her energy to be lower than it should be. Fatigue may be related to obesity, depression or many other causes. Labs will be ordered, and in the meanwhile, Omnia will focus on self care including making healthy food choices, increasing physical activity and focusing on stress reduction.  - EKG 12-Lead - Thyroid Panel With TSH  2. SOBOE (shortness of breath on exertion) Bernece does feel that she gets out of breath more easily that she used to when she exercises. Purvi's shortness of breath appears to be obesity related and exercise induced. She has agreed to work on weight loss and gradually increase exercise to treat her exercise induced shortness of breath. Will continue to monitor closely.  - CBC w/Diff/Platelet  3. Hypertension associated with type 2 diabetes mellitus (HCC) We will check labs today, we will follow-up at patient's next appointment.  - Comprehensive metabolic panel  4. Type 2 diabetes mellitus with hyperglycemia, without long-term current use of insulin (HCC) We will check labs today.  Patient agreed to start Mounjaro 2.5 mg SubQ weekly # 2 mL with no refills.  We will follow-up at her next appointment.  - Insulin, random  5. Hyperlipidemia associated with type 2 diabetes mellitus (HCC) LDL not at goal, and we will repeat labs in 3 months.  6. Vitamin D deficiency We will check labs today, we will follow-up at patient's next appointment.  - VITAMIN D 25 Hydroxy (Vit-D Deficiency, Fractures)  7. Depression screening Fabianna had a positive depression  screening. Depression is commonly associated with obesity and often results in emotional eating behaviors. We will monitor this closely and work on CBT to help improve the non-hunger eating patterns. Referral to Psychology may be required if no improvement is seen as she continues in our clinic.  8. Morbid obesity with BMI of 50.0-59.9, adult (HCC)  9. Obesity, starting BMI 57.6 Joycelyn is currently in the action stage of change and her goal is to continue with weight loss efforts. I recommend Torin begin the structured treatment plan as follows:  She has agreed to the Category 3 Plan.  Exercise goals: No exercise has been prescribed at this time.   Behavioral modification strategies: increasing lean protein intake, meal planning and cooking strategies, better snacking choices, emotional eating strategies, and planning for success.  She was informed of the importance of frequent follow-up visits to maximize her success with intensive lifestyle modifications for her multiple health conditions. She was informed we would discuss her lab results at her next visit unless there is a critical issue that needs to be addressed sooner. Vaani agreed to keep her next visit at the agreed upon time  to discuss these results.  Objective:   Blood pressure 108/73, pulse 79, temperature 98 F (36.7 C), height 5\' 4"  (1.626 m), weight (!) 335 lb (152 kg), SpO2 98 %. Body mass index is 57.5 kg/m.  EKG: Normal sinus rhythm, rate 84 BPM.  Indirect Calorimeter completed today shows a VO2 of 264 and a REE of 1814.  Her calculated basal metabolic rate is 0981 thus her basal metabolic rate is worse than expected.  General: Cooperative, alert, well developed, in no acute distress. HEENT: Conjunctivae and lids unremarkable. Cardiovascular: Regular rhythm.  Lungs: Normal work of breathing. Neurologic: No focal deficits.   Lab Results  Component Value Date   CREATININE 0.71 05/08/2022   BUN 20 05/08/2022   NA  138 05/08/2022   K 4.0 05/08/2022   CL 103 05/08/2022   CO2 28 05/08/2022   Lab Results  Component Value Date   ALT 24 05/08/2022   AST 20 05/08/2022   ALKPHOS 92 01/18/2020   BILITOT 0.6 05/08/2022   Lab Results  Component Value Date   HGBA1C 8.7 (H) 05/08/2022   HGBA1C 6.8 (H) 05/30/2020   HGBA1C 6.7 (H) 01/18/2020   HGBA1C 7.0 (H) 09/24/2019   HGBA1C 7.8 (H) 06/04/2019   Lab Results  Component Value Date   INSULIN 26.5 (H) 01/18/2020   Lab Results  Component Value Date   TSH 1.760 09/07/2018   Lab Results  Component Value Date   CHOL 185 05/08/2022   HDL 52 05/08/2022   LDLCALC 114 (H) 05/08/2022   TRIG 90 05/08/2022   CHOLHDL 3.6 05/08/2022   Lab Results  Component Value Date   WBC 5.7 03/09/2019   HGB 12.6 03/09/2019   HCT 39.3 03/09/2019   MCV 90.3 03/09/2019   PLT 343 03/09/2019   Lab Results  Component Value Date   FERRITIN 837 (H) 03/08/2019   Attestation Statements:   Reviewed by clinician on day of visit: allergies, medications, problem list, medical history, surgical history, family history, social history, and previous encounter notes.  Time spent on visit including pre-visit chart review and post-visit charting and care was 50 minutes.   I, Burt Knack, am acting as transcriptionist for Reuben Likes, MD.  This is the patient's first visit at [facility]. The patient's NEW PATIENT PACKET was reviewed at length. Included in the packet: current and past health history, medications, allergies, ROS, gynecologic history (women only), surgical history, family history, social history, weight history, weight loss surgery history (for those that have had weight loss surgery), nutritional evaluation, mood and food questionnaire, PHQ9, Epworth questionnaire, sleep habits questionnaire, patient life and health improvement goals questionnaire. These will all be scanned into the patient's chart under media.   During the visit, I  independently reviewed the patient's EKG, bioimpedance scale results, and indirect calorimeter results. I used this information to tailor a meal plan for the patient that will help her to lose weight and will improve her obesity-related conditions going forward. I performed a medically necessary appropriate examination and/or evaluation. I discussed the assessment and treatment plan with the patient. The patient was provided an opportunity to ask questions and all were answered. The patient agreed with the plan and demonstrated an understanding of the instructions. Labs were ordered at this visit and will be reviewed at the next visit unless more critical results need to be addressed immediately. Clinical information was updated and documented in the EMR.   I have reviewed the above documentation for accuracy and completeness,  and I agree with the above. - Reuben Likes, MD

## 2022-07-31 LAB — CBC WITH DIFFERENTIAL/PLATELET
Basophils Absolute: 0 10*3/uL (ref 0.0–0.2)
Basos: 0 %
EOS (ABSOLUTE): 0.1 10*3/uL (ref 0.0–0.4)
Eos: 3 %
Hematocrit: 40.2 % (ref 34.0–46.6)
Hemoglobin: 12.9 g/dL (ref 11.1–15.9)
Immature Grans (Abs): 0 10*3/uL (ref 0.0–0.1)
Immature Granulocytes: 0 %
Lymphocytes Absolute: 1.7 10*3/uL (ref 0.7–3.1)
Lymphs: 30 %
MCH: 30 pg (ref 26.6–33.0)
MCHC: 32.1 g/dL (ref 31.5–35.7)
MCV: 94 fL (ref 79–97)
Monocytes Absolute: 0.5 10*3/uL (ref 0.1–0.9)
Monocytes: 8 %
Neutrophils Absolute: 3.4 10*3/uL (ref 1.4–7.0)
Neutrophils: 59 %
Platelets: 204 10*3/uL (ref 150–450)
RBC: 4.3 x10E6/uL (ref 3.77–5.28)
RDW: 12.9 % (ref 11.7–15.4)
WBC: 5.7 10*3/uL (ref 3.4–10.8)

## 2022-07-31 LAB — COMPREHENSIVE METABOLIC PANEL
ALT: 18 IU/L (ref 0–32)
AST: 15 IU/L (ref 0–40)
Albumin: 4.1 g/dL (ref 3.9–4.9)
Alkaline Phosphatase: 101 IU/L (ref 44–121)
BUN/Creatinine Ratio: 22 (ref 12–28)
BUN: 20 mg/dL (ref 8–27)
Bilirubin Total: 0.4 mg/dL (ref 0.0–1.2)
CO2: 24 mmol/L (ref 20–29)
Calcium: 10 mg/dL (ref 8.7–10.3)
Chloride: 101 mmol/L (ref 96–106)
Creatinine, Ser: 0.93 mg/dL (ref 0.57–1.00)
Globulin, Total: 2.7 g/dL (ref 1.5–4.5)
Glucose: 363 mg/dL — ABNORMAL HIGH (ref 70–99)
Potassium: 5 mmol/L (ref 3.5–5.2)
Sodium: 138 mmol/L (ref 134–144)
Total Protein: 6.8 g/dL (ref 6.0–8.5)
eGFR: 70 mL/min/{1.73_m2} (ref >59)

## 2022-07-31 LAB — INSULIN, RANDOM: INSULIN: 22.6 u[IU]/mL (ref 2.6–24.9)

## 2022-07-31 LAB — THYROID PANEL WITH TSH
Free Thyroxine Index: 2 (ref 1.2–4.9)
T3 Uptake Ratio: 27 % (ref 24–39)
T4, Total: 7.3 ug/dL (ref 4.5–12.0)
TSH: 2.14 u[IU]/mL (ref 0.450–4.500)

## 2022-07-31 LAB — VITAMIN D 25 HYDROXY (VIT D DEFICIENCY, FRACTURES): Vit D, 25-Hydroxy: 19.8 ng/mL — ABNORMAL LOW (ref 30.0–100.0)

## 2022-08-01 ENCOUNTER — Telehealth: Payer: Self-pay

## 2022-08-01 ENCOUNTER — Telehealth (INDEPENDENT_AMBULATORY_CARE_PROVIDER_SITE_OTHER): Payer: Self-pay | Admitting: Family Medicine

## 2022-08-01 NOTE — Telephone Encounter (Signed)
New patient seen in clinic.  Patient is requesting a prescription for Mounjaro. Please advise.

## 2022-08-01 NOTE — Telephone Encounter (Signed)
Patient stated she was supposed to get Ruth [name] sent to her pharmacy. Please advise!

## 2022-08-01 NOTE — Telephone Encounter (Signed)
Patient Stated that on the First appointment with Dr. Lawson Radar. Dr, Lawson Radar stated that she was calling in a prescription for [facility]. Erie Noe

## 2022-08-01 NOTE — Telephone Encounter (Signed)
Pls take Glipizide for now until [facility] or Rybelsus is available. Pls let me know which one will get approved first.

## 2022-08-01 NOTE — Telephone Encounter (Signed)
Patient called and stated that [facility] and the MD there wanted to start her on Mounjaro for her diabetes because she didn't think the glipizide was improving her diabetes. Patient states she have been on it for years but Kenard Gower, NP up her dose to 10 mg from 2.5 mg. She reports that her blood sugar readings a higher than normal since stop taking her glipizide 10 mg and she was advised to take the medication until Monina advise her of what to do due to patient stating she does not have any diabetes medication at the moment.  She reports that she waiting on a PA for Rybelsus 3 mg to be approved. Please advise if patient needs to continue on the glipizide 10 mg and wait for the [facility] doctor to send in the [facility] for her.

## 2022-08-02 ENCOUNTER — Other Ambulatory Visit (INDEPENDENT_AMBULATORY_CARE_PROVIDER_SITE_OTHER): Payer: Self-pay | Admitting: Family Medicine

## 2022-08-02 DIAGNOSIS — E1165 Type 2 diabetes mellitus with hyperglycemia: Secondary | ICD-10-CM

## 2022-08-02 MED ORDER — TIRZEPATIDE 2.5 MG/0.5ML ~~LOC~~ SOAJ: 2.5000 mg | SUBCUTANEOUS | 0 refills | Status: DC

## 2022-08-02 NOTE — Telephone Encounter (Signed)
Called patient and left message to return call on voicemail.

## 2022-08-05 ENCOUNTER — Other Ambulatory Visit (INDEPENDENT_AMBULATORY_CARE_PROVIDER_SITE_OTHER): Payer: Self-pay | Admitting: Family Medicine

## 2022-08-05 ENCOUNTER — Telehealth (INDEPENDENT_AMBULATORY_CARE_PROVIDER_SITE_OTHER): Payer: Self-pay | Admitting: Family Medicine

## 2022-08-05 ENCOUNTER — Ambulatory Visit
Admission: RE | Admit: 2022-08-05 | Discharge: 2022-08-05 | Disposition: A | Discharge status: Home / Self Care | Payer: Medicaid Other | Source: Ambulatory Visit | Attending: Adult Health | Admitting: Adult Health

## 2022-08-05 ENCOUNTER — Encounter (INDEPENDENT_AMBULATORY_CARE_PROVIDER_SITE_OTHER): Payer: Self-pay | Admitting: Family Medicine

## 2022-08-05 DIAGNOSIS — Z1231 Encounter for screening mammogram for malignant neoplasm of breast: Secondary | ICD-10-CM

## 2022-08-05 DIAGNOSIS — E1165 Type 2 diabetes mellitus with hyperglycemia: Secondary | ICD-10-CM

## 2022-08-05 NOTE — Telephone Encounter (Signed)
Patient stated she is needing a PA done for [facility]

## 2022-08-05 NOTE — Telephone Encounter (Signed)
Called patient and she was advised per Monina to continue the Glipizide until on of the other medications is approved. She going to reach out to the pharmacy and the [facility] office to see about the [facility].

## 2022-08-06 ENCOUNTER — Encounter (INDEPENDENT_AMBULATORY_CARE_PROVIDER_SITE_OTHER): Payer: Self-pay

## 2022-08-06 ENCOUNTER — Other Ambulatory Visit (INDEPENDENT_AMBULATORY_CARE_PROVIDER_SITE_OTHER): Payer: Self-pay | Admitting: Family Medicine

## 2022-08-06 DIAGNOSIS — E1165 Type 2 diabetes mellitus with hyperglycemia: Secondary | ICD-10-CM

## 2022-08-06 NOTE — Telephone Encounter (Signed)
PA started, message sent to pt

## 2022-08-21 ENCOUNTER — Ambulatory Visit (INDEPENDENT_AMBULATORY_CARE_PROVIDER_SITE_OTHER): Payer: Medicaid Other | Admitting: Family Medicine

## 2022-08-28 ENCOUNTER — Encounter: Payer: Self-pay | Admitting: Adult Health

## 2022-08-29 NOTE — Progress Notes (Signed)
HPI :  62 y/o female with a history of morbid obesity (BMI 57), history of colon polyps, HTN, HLD, DM, here to re-establish care with [place] today.   She was last seen in 10/2016 for a colonoscopy done at the hospital. She had 4 adenomas removed, largest 1cm in size, was told to repeat in 3 years for surveillance. She has not had a colonoscopy since that time.  She feels well without complaints. No problems with her bowels. No blood in her stools. No abdominal pains. Eating okay. Denies any problems with digestion or pains. No FH of colon cancer.  She denies any cardiopulmonary symptoms.   She had an echocardiogram 07/2020 - EF 60-65%.  She denies any prior history of issues with anesthesia.   Colonoscopy 10/29/2016 -  - Diverticulosis in the left colon and in the right colon. - One 10 mm polyp at the ileocecal valve, removed with a cold snare. Resected and retrieved. - One 4 mm polyp in the transverse colon, removed with a cold snare. Resected and retrieved. - One 5 mm polyp in the descending colon, removed with a cold snare. Resected and retrieved. - One 8 mm polyp in the rectum, removed with a hot snare. Resected and retrieved. - Internal hemorrhoids. - The examination was otherwise normal.  1. Ileocecal valve, biopsy - TUBULAR ADENOMA(S). - HIGH GRADE DYSPLASIA IS NOT IDENTIFIED. 2. Rectum, polyp(s) - TUBULAR ADENOMA(S). - HIGH GRADE DYSPLASIA IS NOT IDENTIFIED.  Past Medical History:  Diagnosis Date   Anemia    Arthritis    Back pain    Bilateral calcaneal spurs    Bilateral lower extremity edema    Chest pain    Diabetes (HCC)    Diabetes mellitus without complication (HCC)    type 2   Gallbladder problem    GERD (gastroesophageal reflux disease)    Gout    H/O blood clots    Heartburn    Hyperlipidemia    Hypertension    Obesity    Osteoarthritis    Palpitations    SOBOE (shortness of breath on exertion)    Swallowing difficulty    Type 2 diabetes mellitus (HCC)     Vitamin D deficiency      Past Surgical History:  Procedure Laterality Date   ABDOMINAL HYSTERECTOMY     partial   CHOLECYSTECTOMY     COLONOSCOPY WITH PROPOFOL N/A 10/29/2016   Procedure: COLONOSCOPY WITH PROPOFOL;  Surgeon: Benancio Deeds, MD;  Location: [facility];  Service: Gastroenterology;  Laterality: N/A;   DENTAL SURGERY     ORIF TIBIA & FIBULA FRACTURES Left    Family History  Problem Relation Age of Onset   Hyperlipidemia Mother    Diabetes Mother    Stroke Mother    Dementia Mother    Hypertension Mother    Eating disorder Mother    Obesity Mother    Heart disease Mother    Cancer Father    Hypertension Father    Pancreatic cancer Father    Sudden death Father    Alcoholism Father    Colon polyps Sister    Kidney disease Sister    Cancer Maternal Grandmother        type unknown   Esophageal cancer Neg Hx    Stomach cancer Neg Hx    Colon cancer Neg Hx    Social History   Tobacco Use   Smoking status: Former    Current packs/day: 0.00    Average packs/day:  1.5 packs/day for 32.0 years (48.0 ttl pk-yrs)    Types: Cigarettes    Start date: 02/12/1976    Quit date: 02/12/2008    Years since quitting: 14.5   Smokeless tobacco: Never  Vaping Use   Vaping status: Never Used  Substance Use Topics   Alcohol use: Not Currently    Alcohol/week: 1.0 standard drink of alcohol    Types: 1 Glasses of wine per week   Drug use: No   Current Outpatient Medications  Medication Sig Dispense Refill   atenolol (TENORMIN) 50 MG tablet TAKE 1 TABLET(50 MG) BY MOUTH DAILY 90 tablet 0   atorvastatin (LIPITOR) 40 MG tablet Take 1 tablet (40 mg total) by mouth 3 (three) times a week. 36 tablet 1   Blood Glucose Monitoring Suppl (ACCU-CHEK GUIDE) w/Device KIT 1 Device by Does not apply route daily. CBG daily, pls give strips and lancets 1 kit 0   chlorthalidone (HYGROTON) 25 MG tablet Take 0.5 tablets (12.5 mg total) by mouth every morning. 90 tablet 0   glipiZIDE  (GLUCOTROL XL) 10 MG 24 hr tablet Take 1 tablet (10 mg total) by mouth daily with breakfast. 30 tablet 5   glucose blood (ACCU-CHEK GUIDE) test strip Use as instructed 100 each 12   olmesartan (BENICAR) 40 MG tablet Take 40 mg by mouth daily.     No current facility-administered medications for this visit.   Allergies  Allergen Reactions   Lisinopril Swelling    Throat swelling   Hydrochlorothiazide     Other Reaction(s): sleepy   Tape Other (See Comments)    Certain Band aids cause skin irritation     Review of Systems: All systems reviewed and negative except where noted in HPI.    MM 3D SCREENING MAMMOGRAM BILATERAL BREAST  Result Date: 08/07/2022 CLINICAL DATA:  Screening. EXAM: DIGITAL SCREENING BILATERAL MAMMOGRAM WITH TOMOSYNTHESIS AND CAD TECHNIQUE: Bilateral screening digital craniocaudal and mediolateral oblique mammograms were obtained. Bilateral screening digital breast tomosynthesis was performed. The images were evaluated with computer-aided detection. COMPARISON:  Previous exam(s). ACR Breast Density Category a: The breasts are almost entirely fatty. FINDINGS: There are no findings suspicious for malignancy. IMPRESSION: No mammographic evidence of malignancy. A result letter of this screening mammogram will be mailed directly to the patient. RECOMMENDATION: Screening mammogram in one year. (Code:SM-B-01Y) BI-RADS CATEGORY  1: Negative. Electronically Signed   By: Bary Richard M.D.   On: 08/07/2022 15:48    Physical Exam: BP 126/80   Pulse 67   Ht 5\' 5"  (1.651 m)   Wt (!) 342 lb 9.6 oz (155.4 kg)   SpO2 97%   BMI 57.01 kg/m  Constitutional: Pleasant,well-developed, female in no acute distress. HEENT: Normocephalic and atraumatic. Conjunctivae are normal. No scleral icterus. Neck supple.  Cardiovascular: Normal rate, regular rhythm.  Pulmonary/chest: Effort normal and breath sounds normal.  Abdominal: Soft, nondistended, protuberant, nontender. There are no masses  palpable.  Extremities: no edema Neurological: Alert and oriented to person place and time. Skin: No rashes noted. Psychiatric: Normal mood and affect. Behavior is normal.   ASSESSMENT and PLAN: 62 y.o. female here for assessment of the following  1. History of colonic polyps   2. Morbidly obese (HCC)     Last colonoscopy in 2018 with an advanced adenoma. She is overdue for surveillance colonoscopy. We discussed colonoscopy, risks / benefits of the procedure and anesthesia and she wishes to proceed. Given her BMI is > 50, she is higher than average risk for anesthesia  and in this light her case must be done at the hospital for anesthesia support. We discussed this for a bit and she understands and agrees. Given her case is at the hospital and backlog of cases to be done there, we are booking out a few months. Anticipate her case will be done in Sept or October. She has no anemia, no alarm symptoms, otherwise feels well  Harlin Rain, MD [facility]  CC: Medina-Vargas, Margit Banda*

## 2022-08-30 ENCOUNTER — Encounter: Payer: Self-pay | Admitting: Gastroenterology

## 2022-08-30 ENCOUNTER — Ambulatory Visit (INDEPENDENT_AMBULATORY_CARE_PROVIDER_SITE_OTHER): Payer: Medicaid Other | Admitting: Gastroenterology

## 2022-08-30 VITALS — BP 126/80 | HR 67 | Ht 65.0 in | Wt 342.6 lb

## 2022-08-30 DIAGNOSIS — Z86010 Personal history of colonic polyps: Secondary | ICD-10-CM

## 2022-08-30 MED ORDER — NA SULFATE-K SULFATE-MG SULF 17.5-3.13-1.6 GM/177ML PO SOLN: 1.0000 | Freq: Once | ORAL | 0 refills | Status: AC

## 2022-08-30 NOTE — Patient Instructions (Addendum)
_______________________________________________________  If your blood pressure at your visit was 140/90 or greater, please contact your primary care physician to follow up on this.  _______________________________________________________  If you are age 62 or older, your body mass index should be between 23-30. Your Body mass index is 57.01 kg/m. If this is out of the aforementioned range listed, please consider follow up with your Primary Care Provider.  If you are age 71 or younger, your body mass index should be between 19-25. Your Body mass index is 57.01 kg/m. If this is out of the aformentioned range listed, please consider follow up with your Primary Care Provider.   ________________________________________________________  The [facility] providers would like to encourage you to use [facility] to communicate with providers for non-urgent requests or questions.  Due to long hold times on the telephone, sending your provider a message by [facility] may be a faster and more efficient way to get a response.  Please allow 48 business hours for a response.  Please remember that this is for non-urgent requests.  _______________________________________________________  Bonita Quin have been scheduled for a colonoscopy. Please follow written instructions given to you at your visit today.   Please pick up your prep supplies at the pharmacy within the next 1-3 days.  If you use inhalers (even only as needed), please bring them with you on the day of your procedure.  DO NOT TAKE 7 DAYS PRIOR TO TEST- Trulicity (dulaglutide) Ozempic, Wegovy (semaglutide) Mounjaro (tirzepatide) Bydureon Bcise (exanatide extended release)  DO NOT TAKE 1 DAY PRIOR TO YOUR TEST Rybelsus (semaglutide) Adlyxin (lixisenatide) Victoza (liraglutide) Byetta (exanatide)  Thank you for entrusting me with your care and for choosing [facility], Dr. Ileene Patrick      ___________________________________________________________________________

## 2022-09-02 ENCOUNTER — Ambulatory Visit: Payer: Medicaid Other | Admitting: Adult Health

## 2022-09-02 ENCOUNTER — Encounter: Payer: Self-pay | Admitting: Adult Health

## 2022-09-02 VITALS — BP 127/88 | HR 64 | Temp 97.5°F | Resp 18 | Ht 65.0 in | Wt 341.0 lb

## 2022-09-02 DIAGNOSIS — I10 Essential (primary) hypertension: Secondary | ICD-10-CM

## 2022-09-02 DIAGNOSIS — G629 Polyneuropathy, unspecified: Secondary | ICD-10-CM

## 2022-09-02 DIAGNOSIS — E1169 Type 2 diabetes mellitus with other specified complication: Secondary | ICD-10-CM

## 2022-09-02 DIAGNOSIS — B3731 Acute candidiasis of vulva and vagina: Secondary | ICD-10-CM

## 2022-09-02 DIAGNOSIS — E559 Vitamin D deficiency, unspecified: Secondary | ICD-10-CM

## 2022-09-02 MED ORDER — FLUCONAZOLE 150 MG PO TABS
150.0000 mg | ORAL_TABLET | Freq: Once | ORAL | 0 refills | Status: AC
Start: 2022-09-02 — End: 2022-09-02

## 2022-09-02 MED ORDER — CHOLECALCIFEROL 1.25 MG (50000 UT) PO CAPS
50000.0000 [IU] | ORAL_CAPSULE | ORAL | 2 refills | Status: DC
Start: 2022-09-02 — End: 2022-10-24

## 2022-09-02 MED ORDER — EMPAGLIFLOZIN 10 MG PO TABS: 10.0000 mg | ORAL_TABLET | Freq: Every day | ORAL | 3 refills | Status: DC

## 2022-09-02 NOTE — Progress Notes (Unsigned)
[facility] clinic  Provider: Kenard Gower DNP  Code Status:  Full Code  Goals of Care:     09/02/2022    8:28 AM  Advanced Directives  Does Patient Have a Medical Advance Directive? No  Would patient like information on creating a medical advance directive? No - Patient declined     Chief Complaint  Patient presents with   Medication Management    Check medication/possible refills    HPI: Patient is a 62 y.o. female seen today for medication management. She was accompanied today by her husband.   Type 2 diabetes mellitus with other specified complication, without long-term current use of insulin (HCC) - CBGs at home 285 - 387, prior authorization for Rybelsus was denied, takes Glipizide 10 mg 24 HR daily, complains of having numbness on left hand fingers and leg cramps  Vitamin D deficiency -  vitamin D level 19.8, 07/30/22, not taking supplementations  Vaginal yeast infection -  complained that she has whitish vaginal discharge and she complained itching  Essential hypertension -  BP127/88, takes Atenolol, Chlorthalidone and Olmesartan   Wt Readings from Last 3 Encounters:  09/02/22 (!) 341 lb (154.7 kg)  08/30/22 (!) 342 lb 9.6 oz (155.4 kg)  07/30/22 (!) 335 lb (152 kg)     Past Medical History:  Diagnosis Date   Anemia    Arthritis    Back pain    Bilateral calcaneal spurs    Bilateral lower extremity edema    Chest pain    Diabetes (HCC)    Diabetes mellitus without complication (HCC)    type 2   Gallbladder problem    GERD (gastroesophageal reflux disease)    Gout    H/O blood clots    Heartburn    Hyperlipidemia    Hypertension    Obesity    Osteoarthritis    Palpitations    SOBOE (shortness of breath on exertion)    Swallowing difficulty    Type 2 diabetes mellitus (HCC)    Vitamin D deficiency     Past Surgical History:  Procedure Laterality Date   ABDOMINAL HYSTERECTOMY     partial   CHOLECYSTECTOMY     COLONOSCOPY WITH PROPOFOL N/A  10/29/2016   Procedure: COLONOSCOPY WITH PROPOFOL;  Surgeon: Benancio Deeds, MD;  Location: [facility];  Service: Gastroenterology;  Laterality: N/A;   DENTAL SURGERY     ORIF TIBIA & FIBULA FRACTURES Left     Allergies  Allergen Reactions   Lisinopril Swelling    Throat swelling   Hydrochlorothiazide     Other Reaction(s): sleepy   Tape Other (See Comments)    Certain Band aids cause skin irritation    Outpatient Encounter Medications as of 09/02/2022  Medication Sig   atenolol (TENORMIN) 50 MG tablet TAKE 1 TABLET(50 MG) BY MOUTH DAILY   atorvastatin (LIPITOR) 40 MG tablet Take 1 tablet (40 mg total) by mouth 3 (three) times a week.   Blood Glucose Monitoring Suppl (ACCU-CHEK GUIDE) w/Device KIT 1 Device by Does not apply route daily. CBG daily, pls give strips and lancets   chlorthalidone (HYGROTON) 25 MG tablet Take 0.5 tablets (12.5 mg total) by mouth every morning.   glipiZIDE (GLUCOTROL XL) 10 MG 24 hr tablet Take 1 tablet (10 mg total) by mouth daily with breakfast.   glucose blood (ACCU-CHEK GUIDE) test strip Use as instructed   olmesartan (BENICAR) 40 MG tablet Take 40 mg by mouth daily.   No facility-administered encounter medications on  file as of 09/02/2022.    Review of Systems:  Review of Systems  Constitutional:  Negative for appetite change, chills, fatigue and fever.  HENT:  Negative for congestion, hearing loss, rhinorrhea and sore throat.   Eyes: Negative.   Respiratory:  Negative for cough, shortness of breath and wheezing.   Cardiovascular:  Negative for chest pain, palpitations and leg swelling.  Gastrointestinal:  Negative for abdominal pain, constipation, diarrhea, nausea and vomiting.  Genitourinary:  Positive for vaginal discharge. Negative for dysuria.  Musculoskeletal:  Negative for arthralgias, back pain and myalgias.  Skin:  Negative for color change, rash and wound.  Neurological:  Positive for numbness. Negative for dizziness, weakness and  headaches.       Numbness on left hand fingers  Psychiatric/Behavioral:  Negative for behavioral problems. The patient is not nervous/anxious.     Health Maintenance  Topic Date Due   Lung Cancer Screening  Never done   PAP SMEAR-Modifier  08/22/2020   COVID-19 Vaccine (4 - 2023-24 season) 10/12/2021   Zoster Vaccines- Shingrix (1 of 2) 09/05/2022 (Originally 10/15/1979)   INFLUENZA VACCINE  09/12/2022   HEMOGLOBIN A1C  11/08/2022   Diabetic kidney evaluation - Urine ACR  07/11/2023   FOOT EXAM  07/11/2023   OPHTHALMOLOGY EXAM  07/17/2023   Diabetic kidney evaluation - eGFR measurement  07/30/2023   MAMMOGRAM  08/04/2024   Colonoscopy  10/30/2026   DTaP/Tdap/Td (2 - Td or Tdap) 10/25/2028   Hepatitis C Screening  Completed   HIV Screening  Completed   HPV VACCINES  Aged Out    Physical Exam: Vitals:   09/02/22 1407  BP: 127/88  Pulse: 64  Resp: 18  Temp: (!) 97.5 F (36.4 C)  SpO2: 94%  Weight: (!) 341 lb (154.7 kg)  Height: 5\' 5"  (1.651 m)   Body mass index is 56.75 kg/m. Physical Exam Constitutional:      Appearance: She is obese.  HENT:     Head: Normocephalic and atraumatic.     Nose: Nose normal.     Mouth/Throat:     Mouth: Mucous membranes are moist.  Eyes:     Conjunctiva/sclera: Conjunctivae normal.  Cardiovascular:     Rate and Rhythm: Normal rate and regular rhythm.  Pulmonary:     Effort: Pulmonary effort is normal.     Breath sounds: Normal breath sounds.  Abdominal:     General: Bowel sounds are normal.     Palpations: Abdomen is soft.  Musculoskeletal:        General: Normal range of motion.     Cervical back: Normal range of motion.  Skin:    General: Skin is warm and dry.  Neurological:     General: No focal deficit present.     Mental Status: She is alert and oriented to person, place, and time.  Psychiatric:        Mood and Affect: Mood normal.        Behavior: Behavior normal.        Thought Content: Thought content normal.         Judgment: Judgment normal.     Labs reviewed: Basic Metabolic Panel: Recent Labs    05/08/22 0913 07/30/22 0843  NA 138 138  K 4.0 5.0  CL 103 101  CO2 28 24  GLUCOSE 303* 363*  BUN 20 20  CREATININE 0.71 0.93  CALCIUM 9.8 10.0  TSH  --  2.140   Liver Function Tests: Recent Labs  05/08/22 0913 07/30/22 0843  AST 20 15  ALT 24 18  ALKPHOS  --  101  BILITOT 0.6 0.4  PROT 7.1 6.8  ALBUMIN  --  4.1   No results for input(s): "LIPASE", "AMYLASE" in the last 8760 hours. No results for input(s): "AMMONIA" in the last 8760 hours. CBC: Recent Labs    07/30/22 0843  WBC 5.7  NEUTROABS 3.4  HGB 12.9  HCT 40.2  MCV 94  PLT 204   Lipid Panel: Recent Labs    05/08/22 0913  CHOL 185  HDL 52  LDLCALC 114*  TRIG 90  CHOLHDL 3.6   Lab Results  Component Value Date   HGBA1C 8.7 (H) 05/08/2022    Procedures since last visit: MM 3D SCREENING MAMMOGRAM BILATERAL BREAST  Result Date: 08/07/2022 CLINICAL DATA:  Screening. EXAM: DIGITAL SCREENING BILATERAL MAMMOGRAM WITH TOMOSYNTHESIS AND CAD TECHNIQUE: Bilateral screening digital craniocaudal and mediolateral oblique mammograms were obtained. Bilateral screening digital breast tomosynthesis was performed. The images were evaluated with computer-aided detection. COMPARISON:  Previous exam(s). ACR Breast Density Category a: The breasts are almost entirely fatty. FINDINGS: There are no findings suspicious for malignancy. IMPRESSION: No mammographic evidence of malignancy. A result letter of this screening mammogram will be mailed directly to the patient. RECOMMENDATION: Screening mammogram in one year. (Code:SM-B-01Y) BI-RADS CATEGORY  1: Negative. Electronically Signed   By: Bary Richard M.D.   On: 08/07/2022 15:48    Assessment/Plan  1. Type 2 diabetes mellitus with other specified complication, without long-term current use of insulin (HCC) Lab Results  Component Value Date   HGBA1C 8.7 (H) 05/08/2022   -  continue  Glipizide and start Jardiance -  CBG daily, log and bring to next appointment - empagliflozin (JARDIANCE) 10 MG TABS tablet; Take 1 tablet (10 mg total) by mouth daily before breakfast.  Dispense: 30 tablet; Refill: 3  2. Vitamin D deficiency -  vitamin D level 19.8, low -  will start on Vitamin D supplementation - Cholecalciferol 1.25 MG (50000 UT) capsule; Take 1 capsule (50,000 Units total) by mouth once a week.  Dispense: 4 capsule; Refill: 2  3. Neuropathy -  declines Gabapentin  4. Vaginal yeast infection - fluconazole (DIFLUCAN) 150 MG tablet; Take 1 tablet (150 mg total) by mouth once for 1 dose. Repeat in 72 hours  Dispense: 2 tablet; Refill: 0  5. Essential hypertension -  BP 127/88 -  stable -  continue current meds     Labs/tests ordered:  None  Next appt:  10/09/2022

## 2022-09-04 ENCOUNTER — Encounter (INDEPENDENT_AMBULATORY_CARE_PROVIDER_SITE_OTHER): Payer: Self-pay | Admitting: Family Medicine

## 2022-09-04 ENCOUNTER — Encounter: Payer: Medicaid Other | Admitting: Obstetrics and Gynecology

## 2022-09-04 ENCOUNTER — Ambulatory Visit (INDEPENDENT_AMBULATORY_CARE_PROVIDER_SITE_OTHER): Payer: Medicaid Other | Admitting: Family Medicine

## 2022-09-04 ENCOUNTER — Telehealth: Payer: Self-pay

## 2022-09-04 VITALS — BP 127/84 | HR 67 | Temp 98.3°F | Ht 65.0 in | Wt 336.0 lb

## 2022-09-04 DIAGNOSIS — E559 Vitamin D deficiency, unspecified: Secondary | ICD-10-CM

## 2022-09-04 DIAGNOSIS — E1169 Type 2 diabetes mellitus with other specified complication: Secondary | ICD-10-CM | POA: Diagnosis not present

## 2022-09-04 DIAGNOSIS — Z6841 Body Mass Index (BMI) 40.0 and over, adult: Secondary | ICD-10-CM

## 2022-09-04 DIAGNOSIS — I152 Hypertension secondary to endocrine disorders: Secondary | ICD-10-CM | POA: Diagnosis not present

## 2022-09-04 DIAGNOSIS — E669 Obesity, unspecified: Secondary | ICD-10-CM

## 2022-09-04 DIAGNOSIS — E785 Hyperlipidemia, unspecified: Secondary | ICD-10-CM

## 2022-09-04 DIAGNOSIS — E1159 Type 2 diabetes mellitus with other circulatory complications: Secondary | ICD-10-CM

## 2022-09-04 DIAGNOSIS — Z7985 Long-term (current) use of injectable non-insulin antidiabetic drugs: Secondary | ICD-10-CM

## 2022-09-04 DIAGNOSIS — E1165 Type 2 diabetes mellitus with hyperglycemia: Secondary | ICD-10-CM

## 2022-09-04 MED ORDER — OZEMPIC (0.25 OR 0.5 MG/DOSE) 2 MG/3ML ~~LOC~~ SOPN: 0.2500 mg | PEN_INJECTOR | SUBCUTANEOUS | 0 refills | Status: DC

## 2022-09-04 NOTE — Progress Notes (Signed)
Chief Complaint:   OBESITY Ruth [name] is here to discuss her progress with her obesity treatment plan along with follow-up of her obesity related diagnoses. [name] is on the Category 3 Plan and states she is following her eating plan approximately 20% of the time. [name] states she is not exercising.  Today's visit was #: 17 Starting weight: 336 lbs Starting date: 08/13/2018 Today's weight: 336 lbs Today's date: 09/04/2022 Total lbs lost to date: 0 Total lbs lost since last in-office visit: 0  Interim History: Patient presents for first follow up- NP appointment was 07/30/22 but this appointment had to be rescheduled due to provider being out. She mentions she didn't follow the plan much but did look at it to see what foods she already had at home.  She said in the next few weeks she wants to start on the plan.  She is hoping to get the food for the plan within the next few days. Wondering about whether or not she can have breakfast with meat.  Subjective:   1. Type 2 diabetes mellitus with hyperglycemia, without long-term current use of insulin (HCC) Paticia's A1c is 8.7 and her Insulin is 22.6. She is on glipizde and Rybelsus. [name] was denied.  2. Hypertension associated with diabetes (HCC) Ruth [name]'s blood pressure is controlled today. She is on atenolol, chlorthalidone, and Benicar. [name] denies chest pain, pressure, or headache.  3. Hyperlipidemia associated with type 2 diabetes mellitus (HCC) Ruth [name]'s LDL is 114, triglycerides are 90, and her HDL is 52. She is taking Lipitor daily. No myalgia were reported.  4. Vitamin D deficiency Ruth [name]'s PCP ordered vitamin D 50,000IU for her vitamin D level of 19.8. She notes fatigue.  5. BMI 50.0-59.9, adult (HCC)  Assessment/Plan:   1. Type 2 diabetes mellitus with hyperglycemia, without long-term current use of insulin (HCC) Ruth [name] agrees to start Ozempic 0.25mg  and will follow up at the next appointment.  -  Semaglutide,0.25 or 0.5MG /DOS, (OZEMPIC, 0.25 OR 0.5 MG/DOSE,) 2 MG/3ML SOPN; Inject 0.25 mg into the skin once a week.  Dispense: 3 mL; Refill: 0  2. Hypertension associated with diabetes (HCC) Ruth [name] agrees to continue taking her current medicines with no change in medication or dose.  3. Hyperlipidemia associated with type 2 diabetes mellitus (HCC) Ruth [name] agrees to continue taking her statin. If her LDL is elevated at her repeat labs, we will increase her statin.  4. Vitamin D deficiency Ruth [name] agrees to continue taking prescription vitamin D. We will recheck labs in 3 months.  5. BMI 50.0-59.9, adult (HCC)  6. Obesity with starting BMI of 57.5  Ruth [name] is currently in the action stage of change. As such, her goal is to continue with weight loss efforts. She has agreed to the Category 3 Plan.   Exercise goals: No exercise has been prescribed at this time.  Behavioral modification strategies: increasing lean protein intake, decreasing eating out, meal planning and cooking strategies, and keeping healthy foods in the home.  [name] has agreed to follow-up with our clinic in 2 weeks. She was informed of the importance of frequent follow-up visits to maximize her success with intensive lifestyle modifications for her multiple health conditions.   Objective:   Blood pressure 127/84, pulse 67, temperature 98.3 F (36.8 C), height 5\' 5"  (1.651 m), weight (!) 336 lb (152.4 kg), SpO2 98%. Body mass index is 55.91 kg/m.  General: Cooperative, alert, well developed, in no acute distress. HEENT: Conjunctivae and lids unremarkable. Cardiovascular:  Regular rhythm.  Lungs: Normal work of breathing. Neurologic: No focal deficits.   Lab Results  Component Value Date   CREATININE 0.93 07/30/2022   BUN 20 07/30/2022   NA 138 07/30/2022   K 5.0 07/30/2022   CL 101 07/30/2022   CO2 24 07/30/2022   Lab Results  Component Value Date   ALT 18 07/30/2022   AST 15 07/30/2022   ALKPHOS 101  07/30/2022   BILITOT 0.4 07/30/2022   Lab Results  Component Value Date   HGBA1C 8.7 (H) 05/08/2022   HGBA1C 6.8 (H) 05/30/2020   HGBA1C 6.7 (H) 01/18/2020   HGBA1C 7.0 (H) 09/24/2019   HGBA1C 7.8 (H) 06/04/2019   Lab Results  Component Value Date   INSULIN 22.6 07/30/2022   INSULIN 26.5 (H) 01/18/2020   Lab Results  Component Value Date   TSH 2.140 07/30/2022   Lab Results  Component Value Date   CHOL 185 05/08/2022   HDL 52 05/08/2022   LDLCALC 114 (H) 05/08/2022   TRIG 90 05/08/2022   CHOLHDL 3.6 05/08/2022   Lab Results  Component Value Date   VD25OH 19.8 (L) 07/30/2022   VD25OH 40.5 01/18/2020   VD25OH 24.9 (L) 09/07/2018   Lab Results  Component Value Date   WBC 5.7 07/30/2022   HGB 12.9 07/30/2022   HCT 40.2 07/30/2022   MCV 94 07/30/2022   PLT 204 07/30/2022   Lab Results  Component Value Date   FERRITIN 837 (H) 03/08/2019    Attestation Statements:   Reviewed by clinician on day of visit: allergies, medications, problem list, medical history, surgical history, family history, social history, and previous encounter notes.  Time spent on visit including pre-visit chart review and post-visit care and charting was 50 minutes.   IKirke Corin, CMA, am acting as transcriptionist for Reuben Likes, MD  I have reviewed the above documentation for accuracy and completeness, and I agree with the above. - Reuben Likes, MD

## 2022-09-04 NOTE — Telephone Encounter (Signed)
Incoming fax received from patients pharmacy to initiate a prior authorization for                   .  PA initiated through covermymeds. Key: [id number]  Awaiting reply from the insurance company.

## 2022-09-05 ENCOUNTER — Telehealth (INDEPENDENT_AMBULATORY_CARE_PROVIDER_SITE_OTHER): Payer: Self-pay

## 2022-09-05 NOTE — Telephone Encounter (Signed)
Prior Auth submitted for Ozempic:    Approved. OZEMPIC (0.25 OR 0.5 MG/DOSE) 2MG /3ML Soln Pen-inj is approved from 09/05/2022 to 09/05/2023. All strengths of the drug are approved..  Authorization Expiration Date: September 05, 2023.

## 2022-09-06 ENCOUNTER — Other Ambulatory Visit: Payer: Self-pay | Admitting: Nurse Practitioner

## 2022-09-06 DIAGNOSIS — I10 Essential (primary) hypertension: Secondary | ICD-10-CM

## 2022-09-07 ENCOUNTER — Encounter (INDEPENDENT_AMBULATORY_CARE_PROVIDER_SITE_OTHER): Payer: Self-pay | Admitting: Family Medicine

## 2022-09-09 ENCOUNTER — Other Ambulatory Visit: Payer: Self-pay | Admitting: Adult Health

## 2022-09-09 DIAGNOSIS — I10 Essential (primary) hypertension: Secondary | ICD-10-CM

## 2022-09-09 NOTE — Telephone Encounter (Signed)
   Medication approved and pharmacy informed

## 2022-09-09 NOTE — Telephone Encounter (Signed)
I already responded to the patient.

## 2022-09-09 NOTE — Telephone Encounter (Signed)
Please advise 

## 2022-09-24 ENCOUNTER — Ambulatory Visit (INDEPENDENT_AMBULATORY_CARE_PROVIDER_SITE_OTHER): Payer: Medicaid Other | Admitting: Family Medicine

## 2022-09-24 ENCOUNTER — Encounter (INDEPENDENT_AMBULATORY_CARE_PROVIDER_SITE_OTHER): Payer: Self-pay | Admitting: Family Medicine

## 2022-09-24 VITALS — BP 126/76 | HR 77 | Temp 98.0°F | Ht 65.0 in | Wt 336.0 lb

## 2022-09-24 DIAGNOSIS — Z6841 Body Mass Index (BMI) 40.0 and over, adult: Secondary | ICD-10-CM

## 2022-09-24 DIAGNOSIS — E1165 Type 2 diabetes mellitus with hyperglycemia: Secondary | ICD-10-CM

## 2022-09-24 DIAGNOSIS — I152 Hypertension secondary to endocrine disorders: Secondary | ICD-10-CM | POA: Diagnosis not present

## 2022-09-24 DIAGNOSIS — Z7985 Long-term (current) use of injectable non-insulin antidiabetic drugs: Secondary | ICD-10-CM

## 2022-09-24 DIAGNOSIS — E669 Obesity, unspecified: Secondary | ICD-10-CM

## 2022-09-24 DIAGNOSIS — E1159 Type 2 diabetes mellitus with other circulatory complications: Secondary | ICD-10-CM

## 2022-09-24 MED ORDER — OZEMPIC (0.25 OR 0.5 MG/DOSE) 2 MG/3ML ~~LOC~~ SOPN
0.5000 mg | PEN_INJECTOR | SUBCUTANEOUS | 0 refills | Status: DC
Start: 2022-09-24 — End: 2022-10-28

## 2022-09-24 NOTE — Progress Notes (Signed)
Chief Complaint:   OBESITY Ruth [name] is here to discuss her progress with her obesity treatment plan along with follow-up of her obesity related diagnoses. Ruth [name] is on the Category 3 Plan and states she is following her eating plan approximately 70% of the time. [name] states she is walking and stairs for 10 minutes 7 times per week.  Today's visit was #: 18 Starting weight: 336 lbs Starting date: 08/13/2018 Today's weight: 336 lbs Today's date: 09/24/2022 Total lbs lost to date: 0 Total lbs lost since last in-office visit: 0  Interim History: Since patient was seen last she has been on a higher dose of Ozempic- she is taking 0.5mg .  She has been eating eggs, precooked sausage patties and bread at breakfast.  Lunch she is doing sandwich, milk.  Dinner is 4oz of meat a little rice and vegetables.  For snacks she is doing some grapes, greek yogurt, dole strawberry cream cup and a few chocolate chip cookies.  Next few weeks she doesn't have any plans or events or activities planned.  Subjective:   1. Type 2 diabetes mellitus with hyperglycemia, without long-term current use of insulin (HCC) Patient is on Ozempic 0.25 mg subcu weekly.  No GI side effects were noted.  Her blood sugars range in the 240's (better than 300's).   2. Hypertension associated with diabetes (HCC) Patient's blood pressure is well-controlled today.  She denies chest pain, chest pressure, or headache.  Assessment/Plan:   1. Type 2 diabetes mellitus with hyperglycemia, without long-term current use of insulin (HCC) We will refill Ozempic 0.5 mg subcu weekly for 1 month.  - Semaglutide,0.25 or 0.5MG /DOS, (OZEMPIC, 0.25 OR 0.5 MG/DOSE,) 2 MG/3ML SOPN; Inject 0.5 mg into the skin once a week.  Dispense: 3 mL; Refill: 0  2. Hypertension associated with diabetes (HCC) Patient will continue atenolol, chlorthalidone, and Benicar.  3. BMI 50.0-59.9, adult (HCC)  4. Obesity with starting BMI of 57.5 Ruth [name] is currently  in the action stage of change. As such, her goal is to continue with weight loss efforts. She has agreed to the Category 3 Plan.   Exercise goals: As is.   Behavioral modification strategies: increasing lean protein intake, meal planning and cooking strategies, keeping healthy foods in the home, and planning for success.  Ruth [name] has agreed to follow-up with our clinic in 2 weeks. She was informed of the importance of frequent follow-up visits to maximize her success with intensive lifestyle modifications for her multiple health conditions.   Objective:   Blood pressure 126/76, pulse 77, temperature 98 F (36.7 C), height 5\' 5"  (1.651 m), weight (!) 336 lb (152.4 kg), SpO2 98%. Body mass index is 55.91 kg/m.  General: Cooperative, alert, well developed, in no acute distress. HEENT: Conjunctivae and lids unremarkable. Cardiovascular: Regular rhythm.  Lungs: Normal work of breathing. Neurologic: No focal deficits.   Lab Results  Component Value Date   CREATININE 0.93 07/30/2022   BUN 20 07/30/2022   NA 138 07/30/2022   K 5.0 07/30/2022   CL 101 07/30/2022   CO2 24 07/30/2022   Lab Results  Component Value Date   ALT 18 07/30/2022   AST 15 07/30/2022   ALKPHOS 101 07/30/2022   BILITOT 0.4 07/30/2022   Lab Results  Component Value Date   HGBA1C 8.7 (H) 05/08/2022   HGBA1C 6.8 (H) 05/30/2020   HGBA1C 6.7 (H) 01/18/2020   HGBA1C 7.0 (H) 09/24/2019   HGBA1C 7.8 (H) 06/04/2019   Lab Results  Component  Value Date   INSULIN 22.6 07/30/2022   INSULIN 26.5 (H) 01/18/2020   Lab Results  Component Value Date   TSH 2.140 07/30/2022   Lab Results  Component Value Date   CHOL 185 05/08/2022   HDL 52 05/08/2022   LDLCALC 114 (H) 05/08/2022   TRIG 90 05/08/2022   CHOLHDL 3.6 05/08/2022   Lab Results  Component Value Date   VD25OH 19.8 (L) 07/30/2022   VD25OH 40.5 01/18/2020   VD25OH 24.9 (L) 09/07/2018   Lab Results  Component Value Date   WBC 5.7 07/30/2022   HGB  12.9 07/30/2022   HCT 40.2 07/30/2022   MCV 94 07/30/2022   PLT 204 07/30/2022   Lab Results  Component Value Date   FERRITIN 837 (H) 03/08/2019   Attestation Statements:   Reviewed by clinician on day of visit: allergies, medications, problem list, medical history, surgical history, family history, social history, and previous encounter notes.   I, Burt Knack, am acting as transcriptionist for Reuben Likes, MD.  I have reviewed the above documentation for accuracy and completeness, and I agree with the above. - Reuben Likes, MD

## 2022-09-25 ENCOUNTER — Telehealth: Payer: Self-pay

## 2022-09-25 NOTE — Telephone Encounter (Signed)
Called and spoke to patient.  She is currently scheduled to have colonoscopy on 9-12. Dr. Adela Lank has an opening next Tuesday, 8-20. She said she would be OK to move her procedure up.  She understands I will redo her instructions and send to her MyChart. She will review and call with any questions.  Patient knows she will need to arrive at 6:00AM to [facility]. She is not on blood thinner but she takes Ozempic on Saturdays.  Patient was instructed NOT to take Ozempic this Saturday, 8-17 before the procedure on Tuesday. Anew instructions sent to [facility]. Patient rescheduled

## 2022-09-26 ENCOUNTER — Encounter (HOSPITAL_COMMUNITY): Payer: Self-pay | Admitting: Gastroenterology

## 2022-09-26 NOTE — Progress Notes (Signed)
Ruth [name]  Prep instructions- reviewed  PCP-Monina Medina-Vargas NP Cardiologist- Nahser MD  EKG-07/30/22 Echo-08/10/20 Cath-n/a Stress- n/a ICD/PM-n/a Blood thinner- n/a GLP-1- Ozempic last dose 8/10 hold 7 days   Hx: DM2, HTN, GERD. Was seeing cardiology for some chest pain last office visit was 10/25/21 at the time they did EKG and felt like it was more of a gastric issue causing chest pain rather than cardiac. They recommended a 6 month f/u but if doing well just f/u as needed. Patient reports no cardiac issues right now, denies chest pain.   Anesthesia Review: Yes

## 2022-09-30 ENCOUNTER — Encounter (HOSPITAL_COMMUNITY): Payer: Self-pay | Admitting: Gastroenterology

## 2022-09-30 NOTE — Anesthesia Preprocedure Evaluation (Signed)
Anesthesia Evaluation  Patient identified by MRN, date of birth, ID band Patient awake    Reviewed: Allergy & Precautions, NPO status , Patient's Chart, lab work & pertinent test results, reviewed documented beta blocker date and time   History of Anesthesia Complications Negative for: history of anesthetic complications  Airway Mallampati: II  TM Distance: >3 FB Neck ROM: Full    Dental  (+) Edentulous Upper, Missing,    Pulmonary former smoker   Pulmonary exam normal        Cardiovascular hypertension, Pt. on medications and Pt. on home beta blockers Normal cardiovascular exam     Neuro/Psych    Depression       GI/Hepatic Neg liver ROS,GERD  ,,  Endo/Other  diabetes (on Ozempic (last dose >7days ago)), Type 2  Morbid obesity (BMI 56)  Renal/GU negative Renal ROS  negative genitourinary   Musculoskeletal  (+) Arthritis ,    Abdominal   Peds  Hematology negative hematology ROS (+)   Anesthesia Other Findings Day of surgery medications reviewed with patient.  Reproductive/Obstetrics                              Anesthesia Physical Anesthesia Plan  ASA: 3  Anesthesia Plan: MAC   Post-op Pain Management: Minimal or no pain anticipated   Induction:   PONV Risk Score and Plan: 2 and Treatment may vary due to age or medical condition and Propofol infusion  Airway Management Planned: Natural Airway and Nasal Cannula  Additional Equipment: None  Intra-op Plan:   Post-operative Plan:   Informed Consent: I have reviewed the patients History and Physical, chart, labs and discussed the procedure including the risks, benefits and alternatives for the proposed anesthesia with the patient or authorized representative who has indicated his/her understanding and acceptance.       Plan Discussed with: CRNA  Anesthesia Plan Comments:         Anesthesia Quick Evaluation

## 2022-10-01 ENCOUNTER — Ambulatory Visit (HOSPITAL_COMMUNITY): Payer: Medicaid Other | Admitting: Anesthesiology

## 2022-10-01 ENCOUNTER — Ambulatory Visit (HOSPITAL_BASED_OUTPATIENT_CLINIC_OR_DEPARTMENT_OTHER): Payer: Medicaid Other | Admitting: Anesthesiology

## 2022-10-01 ENCOUNTER — Ambulatory Visit (HOSPITAL_COMMUNITY)
Admission: RE | Admit: 2022-10-01 | Discharge: 2022-10-01 | Disposition: A | Discharge status: Home / Self Care | Payer: Medicaid Other | Attending: Gastroenterology | Admitting: Gastroenterology

## 2022-10-01 ENCOUNTER — Encounter (HOSPITAL_COMMUNITY)
Admission: RE | Disposition: A | Discharge status: Home / Self Care | Payer: Self-pay | Source: Home / Self Care | Attending: Gastroenterology

## 2022-10-01 ENCOUNTER — Other Ambulatory Visit: Payer: Self-pay

## 2022-10-01 DIAGNOSIS — K648 Other hemorrhoids: Secondary | ICD-10-CM | POA: Diagnosis not present

## 2022-10-01 DIAGNOSIS — I10 Essential (primary) hypertension: Secondary | ICD-10-CM

## 2022-10-01 DIAGNOSIS — Z860100 Personal history of colon polyps, unspecified: Secondary | ICD-10-CM

## 2022-10-01 DIAGNOSIS — Z6841 Body Mass Index (BMI) 40.0 and over, adult: Secondary | ICD-10-CM | POA: Diagnosis not present

## 2022-10-01 DIAGNOSIS — Z83719 Family history of colon polyps, unspecified: Secondary | ICD-10-CM | POA: Diagnosis not present

## 2022-10-01 DIAGNOSIS — D122 Benign neoplasm of ascending colon: Secondary | ICD-10-CM | POA: Diagnosis not present

## 2022-10-01 DIAGNOSIS — Z7985 Long-term (current) use of injectable non-insulin antidiabetic drugs: Secondary | ICD-10-CM | POA: Insufficient documentation

## 2022-10-01 DIAGNOSIS — Z833 Family history of diabetes mellitus: Secondary | ICD-10-CM | POA: Diagnosis not present

## 2022-10-01 DIAGNOSIS — Z87891 Personal history of nicotine dependence: Secondary | ICD-10-CM | POA: Diagnosis not present

## 2022-10-01 DIAGNOSIS — K635 Polyp of colon: Secondary | ICD-10-CM | POA: Insufficient documentation

## 2022-10-01 DIAGNOSIS — Z1211 Encounter for screening for malignant neoplasm of colon: Secondary | ICD-10-CM | POA: Diagnosis not present

## 2022-10-01 DIAGNOSIS — Z79899 Other long term (current) drug therapy: Secondary | ICD-10-CM | POA: Diagnosis not present

## 2022-10-01 DIAGNOSIS — D120 Benign neoplasm of cecum: Secondary | ICD-10-CM | POA: Diagnosis not present

## 2022-10-01 DIAGNOSIS — D125 Benign neoplasm of sigmoid colon: Secondary | ICD-10-CM

## 2022-10-01 DIAGNOSIS — M1990 Unspecified osteoarthritis, unspecified site: Secondary | ICD-10-CM | POA: Insufficient documentation

## 2022-10-01 DIAGNOSIS — Z8249 Family history of ischemic heart disease and other diseases of the circulatory system: Secondary | ICD-10-CM | POA: Diagnosis not present

## 2022-10-01 DIAGNOSIS — D126 Benign neoplasm of colon, unspecified: Secondary | ICD-10-CM

## 2022-10-01 DIAGNOSIS — E119 Type 2 diabetes mellitus without complications: Secondary | ICD-10-CM | POA: Diagnosis not present

## 2022-10-01 DIAGNOSIS — Z7984 Long term (current) use of oral hypoglycemic drugs: Secondary | ICD-10-CM | POA: Diagnosis not present

## 2022-10-01 DIAGNOSIS — K5730 Diverticulosis of large intestine without perforation or abscess without bleeding: Secondary | ICD-10-CM | POA: Insufficient documentation

## 2022-10-01 HISTORY — PX: POLYPECTOMY: SHX5525

## 2022-10-01 HISTORY — PX: COLONOSCOPY WITH PROPOFOL: SHX5780

## 2022-10-01 LAB — GLUCOSE, CAPILLARY: Glucose-Capillary: 201 mg/dL — ABNORMAL HIGH (ref 70–99)

## 2022-10-01 SURGERY — COLONOSCOPY WITH PROPOFOL
Anesthesia: Monitor Anesthesia Care

## 2022-10-01 MED ORDER — PROPOFOL 500 MG/50ML IV EMUL
INTRAVENOUS | Status: DC | PRN
  Administered 2022-10-01: 40 mg via INTRAVENOUS
  Administered 2022-10-01: 125 ug/kg/min via INTRAVENOUS

## 2022-10-01 MED ORDER — PROPOFOL 1000 MG/100ML IV EMUL
INTRAVENOUS | Status: AC
  Filled 2022-10-01: qty 100

## 2022-10-01 MED ORDER — LIDOCAINE HCL (CARDIAC) PF 100 MG/5ML IV SOSY
PREFILLED_SYRINGE | INTRAVENOUS | Status: DC | PRN
  Administered 2022-10-01: 60 mg via INTRAVENOUS

## 2022-10-01 MED ORDER — SODIUM CHLORIDE 0.9 % IV SOLN: INTRAVENOUS | Status: DC

## 2022-10-01 MED ORDER — PROPOFOL 500 MG/50ML IV EMUL
INTRAVENOUS | Status: AC
  Filled 2022-10-01: qty 200

## 2022-10-01 MED ORDER — LACTATED RINGERS IV SOLN: INTRAVENOUS | Status: DC

## 2022-10-01 SURGICAL SUPPLY — 22 items

## 2022-10-01 NOTE — Transfer of Care (Signed)
Immediate Anesthesia Transfer of Care Note  Patient: Ruth [name]  Procedure(s) Performed: Procedure(s) with comments: COLONOSCOPY WITH PROPOFOL (N/A) - BMI = 57 POLYPECTOMY  Patient Location: PACU  Anesthesia Type:MAC  Level of Consciousness:  sedated, patient cooperative and responds to stimulation  Airway & Oxygen Therapy:Patient Spontanous Breathing and Patient connected to face mask oxgen  Post-op Assessment:  Report given to PACU RN and Post -op Vital signs reviewed and stable  Post vital signs:  Reviewed and stable  Last Vitals:  Vitals:   10/01/22 0648  BP: (!) 150/73  Pulse: 71  Resp: 17  Temp: (!) 36.1 C  SpO2: 100%    Complications: No apparent anesthesia complications

## 2022-10-01 NOTE — Op Note (Signed)
[facility] Patient Name: Ruth [name] Procedure Date: 10/01/2022 MRN: [id number] Attending MD: Willaim Rayas. Adela Lank , MD, [id number] Date of Birth: 1960-04-07 CSN: [id number] Age: 62 Admit Type: Outpatient Procedure:                Colonoscopy Indications:              High risk colon cancer surveillance: Personal                            history of colonic polyps - last exam 10/2016 -                            adenomas including advanced adenoma. Case done at                            the hospital for anesthesia support, BMI > 50 Providers:                Willaim Rayas. Adela Lank, MD, Stephens Shire RN, RN,                            Cephus Richer, RN, Priscella Mann, Technician Referring MD:              Medicines:                Monitored Anesthesia Care Complications:            No immediate complications. Estimated blood loss:                            Minimal. Estimated Blood Loss:     Estimated blood loss was minimal. Procedure:                Pre-Anesthesia Assessment:                           - Prior to the procedure, a History and Physical                            was performed, and patient medications and                            allergies were reviewed. The patient's tolerance of                            previous anesthesia was also reviewed. The risks                            and benefits of the procedure and the sedation                            options and risks were discussed with the patient.                            All questions were answered, and informed consent  was obtained. Prior Anticoagulants: The patient has                            taken no anticoagulant or antiplatelet agents. ASA                            Grade Assessment: III - A patient with severe                            systemic disease. After reviewing the risks and                            benefits, the patient was deemed in satisfactory                             condition to undergo the procedure.                           After obtaining informed consent, the colonoscope                            was passed under direct vision. Throughout the                            procedure, the patient's blood pressure, pulse, and                            oxygen saturations were monitored continuously. The                            CF-HQ190L ([id number]) Olympus colonoscope was                            introduced through the anus and advanced to the the                            cecum, identified by appendiceal orifice and                            ileocecal valve. The colonoscopy was performed                            without difficulty. The patient tolerated the                            procedure well. The quality of the bowel                            preparation was good. The ileocecal valve,                            appendiceal orifice, and rectum were photographed. Scope In: 7:33:29 AM Scope Out: 7:50:23 AM Scope Withdrawal Time: 0 hours 14 minutes 28 seconds  Total Procedure Duration: 0 hours  16 minutes 54 seconds  Findings:      The perianal and digital rectal examinations were normal.      A 3 mm polyp was found in the cecum. The polyp was sessile. The polyp       was removed with a cold snare. Resection and retrieval were complete.      Two sessile polyps were found in the ascending colon. The polyps were 3       to 4 mm in size. These polyps were removed with a cold snare. Resection       and retrieval were complete.      A 3 to 4 mm polyp was found in the sigmoid colon. The polyp was sessile.       The polyp was removed with a cold snare. Resection and retrieval were       complete.      Many small-mouthed diverticula were found in the entire colon.      Internal hemorrhoids were found during retroflexion.      The exam was otherwise without abnormality. Impression:               - One 3 mm polyp in the  cecum, removed with a cold                            snare. Resected and retrieved.                           - Two 3 to 4 mm polyps in the ascending colon,                            removed with a cold snare. Resected and retrieved.                           - One 3 to 4 mm polyp in the sigmoid colon, removed                            with a cold snare. Resected and retrieved.                           - Diverticulosis in the entire examined colon.                           - Internal hemorrhoids.                           - The examination was otherwise normal. Moderate Sedation:      No moderate sedation, case performed with MAC Recommendation:           - Patient has a contact number available for                            emergencies. The signs and symptoms of potential                            delayed complications were discussed with the  patient. Return to normal activities tomorrow.                            Written discharge instructions were provided to the                            patient.                           - Resume previous diet.                           - Continue present medications.                           - Await pathology results. Procedure Code(s):        --- Professional ---                           [phone], Colonoscopy, flexible; with removal of                            tumor(s), polyp(s), or other lesion(s) by snare                            technique Diagnosis Code(s):        --- Professional ---                           Z86.010, Personal history of colonic polyps                           D12.0, Benign neoplasm of cecum                           D12.5, Benign neoplasm of sigmoid colon                           D12.2, Benign neoplasm of ascending colon                           K64.8, Other hemorrhoids CPT copyright 2022 American Medical Association. All rights reserved. The codes documented in this report are  preliminary and upon coder review may  be revised to meet current compliance requirements. Viviann Spare P. Adela Lank, MD 10/01/2022 8:00:44 AM This report has been signed electronically. Number of Addenda: 0

## 2022-10-01 NOTE — Anesthesia Postprocedure Evaluation (Signed)
Anesthesia Post Note  Patient: Ruth [name]  Procedure(s) Performed: COLONOSCOPY WITH PROPOFOL POLYPECTOMY     Patient location during evaluation: PACU Anesthesia Type: MAC Level of consciousness: awake and alert Pain management: pain level controlled Vital Signs Assessment: post-procedure vital signs reviewed and stable Respiratory status: spontaneous breathing, nonlabored ventilation and respiratory function stable Cardiovascular status: blood pressure returned to baseline Postop Assessment: no apparent nausea or vomiting Anesthetic complications: no   No notable events documented.  Last Vitals:  Vitals:   10/01/22 0800 10/01/22 0810  BP: 112/82 (!) 136/92  Pulse: 72 74  Resp: 17 15  Temp:    SpO2: 99% 100%    Last Pain:  Vitals:   10/01/22 0810  TempSrc:   PainSc: 0-No pain                 Shanda Howells

## 2022-10-01 NOTE — Discharge Instructions (Signed)
YOU HAD AN ENDOSCOPIC PROCEDURE TODAY: Refer to the procedure report and other information in the discharge instructions given to you for any specific questions about what was found during the examination. If this information does not answer your questions, please call [place] office at [phone] to clarify.   YOU SHOULD EXPECT: Some feelings of bloating in the abdomen. Passage of more gas than usual. Walking can help get rid of the air that was put into your GI tract during the procedure and reduce the bloating. If you had a lower endoscopy (such as a colonoscopy or flexible sigmoidoscopy) you may notice spotting of blood in your stool or on the toilet paper. Some abdominal soreness may be present for a day or two, also.  DIET: Your first meal following the procedure should be a light meal and then it is ok to progress to your normal diet. A half-sandwich or bowl of soup is an example of a good first meal. Heavy or fried foods are harder to digest and may make you feel nauseous or bloated. Drink plenty of fluids but you should avoid alcoholic beverages for 24 hours.  ACTIVITY: Your care partner should take you home directly after the procedure. You should plan to take it easy, moving slowly for the rest of the day. You can resume normal activity the day after the procedure however YOU SHOULD NOT DRIVE, use power tools, machinery or perform tasks that involve climbing or major physical exertion for 24 hours (because of the sedation medicines used during the test).   SYMPTOMS TO REPORT IMMEDIATELY: A gastroenterologist can be reached at any hour. Please call [phone]  for any of the following symptoms:  Following lower endoscopy (colonoscopy, flexible sigmoidoscopy) Excessive amounts of blood in the stool  Significant tenderness, worsening of abdominal pains  Swelling of the abdomen that is new, acute  Fever of 100 or higher   FOLLOW UP:  If any biopsies were taken you will be contacted by  phone or by letter within the next 1-3 weeks. Call [phone]  if you have not heard about the biopsies in 3 weeks.  Please also call with any specific questions about appointments or follow up tests. 

## 2022-10-01 NOTE — H&P (Signed)
[facility] History and Physical   Primary Care Physician:  Ruth Santa, NP   Reason for Procedure:   History of colon polyps  Plan:    colonoscopy     HPI: Ruth [name] is a 62 y.o. female  here for colonoscopy surveillance. Last colonoscopy 10/2016 - multiple adenomas, one advanced. Patient denies any bowel symptoms at this time. No family history of colon cancer known. Otherwise feels well without any cardiopulmonary symptoms. Case is being done at the hospital given BMI > 50 (55), higher risk for anesthesia. She understands this and wishes to proceed following discussion of risks / benefits of the exam and anesthesia. Further recommendations pending the results of her exam.   I have discussed risks / benefits of anesthesia and endoscopic procedure with Ruth [name] and they wish to proceed with the exams as outlined today.    Past Medical History:  Diagnosis Date   Anemia    Arthritis    Back pain    Bilateral calcaneal spurs    Bilateral lower extremity edema    Chest pain    Diabetes (HCC)    Diabetes mellitus without complication (HCC)    type 2   Gallbladder problem    GERD (gastroesophageal reflux disease)    Gout    H/O blood clots    Heartburn    Hyperlipidemia    Hypertension    Obesity    Osteoarthritis    Palpitations    SOBOE (shortness of breath on exertion)    Swallowing difficulty    Type 2 diabetes mellitus (HCC)    Vitamin D deficiency     Past Surgical History:  Procedure Laterality Date   ABDOMINAL HYSTERECTOMY     partial   CHOLECYSTECTOMY     COLONOSCOPY WITH PROPOFOL N/A 10/29/2016   Procedure: COLONOSCOPY WITH PROPOFOL;  Surgeon: Ruth Deeds, MD;  Location: [facility];  Service: Gastroenterology;  Laterality: N/A;   DENTAL SURGERY     ORIF TIBIA & FIBULA FRACTURES Left     Prior to Admission medications   Medication Sig Start Date End Date Taking? Authorizing Provider  atenolol (TENORMIN) 50 MG  tablet TAKE 1 TABLET BY MOUTH DAILY 09/09/22  Yes Medina-Vargas, Monina C, NP  atorvastatin (LIPITOR) 40 MG tablet Take 1 tablet (40 mg total) by mouth 3 (three) times a week. 05/17/22  Yes Medina-Vargas, Monina C, NP  chlorthalidone (HYGROTON) 25 MG tablet Take 0.5 tablets (12.5 mg total) by mouth every morning. 06/03/22  Yes Medina-Vargas, Monina C, NP  Cholecalciferol 1.25 MG (50000 UT) capsule Take 1 capsule (50,000 Units total) by mouth once a week. 09/02/22  Yes Medina-Vargas, Monina C, NP  glipiZIDE (GLUCOTROL XL) 10 MG 24 hr tablet Take 1 tablet (10 mg total) by mouth daily with breakfast. 07/11/22  Yes Medina-Vargas, Monina C, NP  olmesartan (BENICAR) 40 MG tablet Take 40 mg by mouth daily. 10/10/21  Yes [provider]  Blood Glucose Monitoring Suppl (ACCU-CHEK GUIDE) w/Device KIT 1 Device by Does not apply route daily. CBG daily, pls give strips and lancets 06/06/22   Medina-Vargas, Monina C, NP  glucose blood (ACCU-CHEK GUIDE) test strip Use as instructed 06/06/22   Medina-Vargas, Monina C, NP  Semaglutide,0.25 or 0.5MG /DOS, (OZEMPIC, 0.25 OR 0.5 MG/DOSE,) 2 MG/3ML SOPN Inject 0.5 mg into the skin once a week. 09/24/22   Langston Reusing, MD    Current Facility-Administered Medications  Medication Dose Route Frequency Provider Last Rate Last Admin  0.9 %  sodium chloride infusion   Intravenous Continuous Avila Albritton, Willaim Rayas, MD       lactated ringers infusion   Intravenous Continuous Geraldin Habermehl, Willaim Rayas, MD 50 mL/hr at 10/01/22 0701 New Bag at 10/01/22 0701    Allergies as of 08/30/2022 - Review Complete 08/30/2022  Allergen Reaction Noted   Lisinopril Swelling 10/30/2017   Hydrochlorothiazide  05/30/2022   Tape Other (See Comments) 10/24/2016    Family History  Problem Relation Age of Onset   Hyperlipidemia Mother    Diabetes Mother    Stroke Mother    Dementia Mother    Hypertension Mother    Eating disorder Mother    Obesity Mother    Heart disease Mother     Cancer Father    Hypertension Father    Pancreatic cancer Father    Sudden death Father    Alcoholism Father    Colon polyps Sister    Kidney disease Sister    Cancer Maternal Grandmother        type unknown   Esophageal cancer Neg Hx    Stomach cancer Neg Hx    Colon cancer Neg Hx     Social History   Socioeconomic History   Marital status: Married    Spouse name: Ruth [name]   Number of children: 3   Years of education: Not on file   Highest education level: Not on file  Occupational History   Occupation: Merchant navy officer   Occupation: Retired  Tobacco Use   Smoking status: Former    Current packs/day: 0.00    Average packs/day: 1.5 packs/day for 32.0 years (48.0 ttl pk-yrs)    Types: Cigarettes    Start date: 02/12/1976    Quit date: 02/12/2008    Years since quitting: 14.6   Smokeless tobacco: Never  Vaping Use   Vaping status: Never Used  Substance and Sexual Activity   Alcohol use: Not Currently    Alcohol/week: 1.0 standard drink of alcohol    Types: 1 Glasses of wine per week   Drug use: No   Sexual activity: Yes    Birth control/protection: Surgical, Post-menopausal  Other Topics Concern   Not on file  Social History Narrative   Not on file   Social Determinants of Health   Financial Resource Strain: Not on file  Food Insecurity: No Food Insecurity (05/30/2022)   Hunger Vital Sign    Worried About Running Out of Food in the Last Year: Never true    Ran Out of Food in the Last Year: Never true  Transportation Needs: No Transportation Needs (05/30/2022)   PRAPARE - Administrator, Civil Service (Medical): No    Lack of Transportation (Non-Medical): No  Physical Activity: Not on file  Stress: Not on file  Social Connections: Not on file  Intimate Partner Violence: Not on file    Review of Systems: All other review of systems negative except as mentioned in the HPI.  Physical Exam: Vital signs BP (!) 150/73   Pulse 71   Temp (!) 97 F (36.1  C) (Temporal)   Resp 17   Ht 5\' 5"  (1.651 m)   Wt (!) 152.4 kg   SpO2 100%   BMI 55.91 kg/m   General:   Alert,  Well-developed, pleasant and cooperative in NAD Lungs:  Clear throughout to auscultation.   Heart:  Regular rate and rhythm Abdomen:  Soft, protuberant, nontender  Neuro/Psych:  Alert and cooperative. Normal mood and affect. A  and O x 3  Harlin Rain, MD [facility]

## 2022-10-02 LAB — SURGICAL PATHOLOGY

## 2022-10-05 ENCOUNTER — Encounter (HOSPITAL_COMMUNITY): Payer: Self-pay | Admitting: Gastroenterology

## 2022-10-09 ENCOUNTER — Encounter: Payer: Medicaid Other | Admitting: Adult Health

## 2022-10-10 ENCOUNTER — Encounter: Payer: Self-pay | Admitting: Gastroenterology

## 2022-10-10 NOTE — Progress Notes (Signed)
This encounter was created in error - please disregard.

## 2022-10-24 ENCOUNTER — Ambulatory Visit: Payer: Medicaid Other | Admitting: Adult Health

## 2022-10-24 VITALS — BP 122/80 | HR 87 | Temp 98.0°F | Resp 17 | Ht 65.0 in | Wt 340.6 lb

## 2022-10-24 DIAGNOSIS — E1169 Type 2 diabetes mellitus with other specified complication: Secondary | ICD-10-CM

## 2022-10-24 DIAGNOSIS — E559 Vitamin D deficiency, unspecified: Secondary | ICD-10-CM

## 2022-10-24 DIAGNOSIS — B3731 Acute candidiasis of vulva and vagina: Secondary | ICD-10-CM | POA: Diagnosis not present

## 2022-10-24 DIAGNOSIS — E7849 Other hyperlipidemia: Secondary | ICD-10-CM | POA: Diagnosis not present

## 2022-10-24 DIAGNOSIS — Z2821 Immunization not carried out because of patient refusal: Secondary | ICD-10-CM

## 2022-10-24 DIAGNOSIS — I10 Essential (primary) hypertension: Secondary | ICD-10-CM

## 2022-10-24 MED ORDER — FLUCONAZOLE 150 MG PO TABS: 150.0000 mg | ORAL_TABLET | ORAL | 0 refills | Status: AC

## 2022-10-24 MED ORDER — CHOLECALCIFEROL 1.25 MG (50000 UT) PO CAPS
50000.0000 [IU] | ORAL_CAPSULE | ORAL | 2 refills | Status: DC
Start: 2022-10-24 — End: 2022-11-25

## 2022-10-24 MED ORDER — METFORMIN HCL 500 MG PO TABS
500.0000 mg | ORAL_TABLET | Freq: Two times a day (BID) | ORAL | 3 refills | Status: DC
Start: 2022-10-24 — End: 2022-10-25

## 2022-10-24 NOTE — Progress Notes (Signed)
[facility]  Provider:  Kenard Gower DNP  Code Status:  Full Code  Goals of Care:     10/24/2022    2:54 PM  Advanced Directives  Does Patient Have a Medical Advance Directive? No  Would patient like information on creating a medical advance directive? No - Patient declined     Chief Complaint  Patient presents with   Medical Management of Chronic Issues    Patient is being seen for 3 month follow up    Immunizations    Patient is due for shingles, covid and patient will like flu shot    HPI: Patient is a 62 y.o. female seen today for a 41-month follow up of chronic medical issues. She complained of having vaginal itch and thick whitish vaginal drainage. She declines flu vaccine.  Type 2 diabetes mellitus with morbid obesity (HCC) - CBGs at home averaging in the 240s, takes Ozempic, not taking Glipizide  Essential hypertension -  BP 122/80, takes Hygroton, Olmesartan and Atenolol  Other hyperlipidemia -  takes Atorvastatin  Morbidly obese (HCC) -  recently started Ozempic, plans to go to [facility] to exercise  Vitamin D deficiency - run out of Vitamin D medication    Wt Readings from Last 3 Encounters:  10/24/22 (!) 340 lb 9.6 oz (154.5 kg)  10/01/22 (!) 336 lb (152.4 kg)  09/24/22 (!) 336 lb (152.4 kg)     Past Medical History:  Diagnosis Date   Anemia    Arthritis    Back pain    Bilateral calcaneal spurs    Bilateral lower extremity edema    Chest pain    Diabetes (HCC)    Diabetes mellitus without complication (HCC)    type 2   Gallbladder problem    GERD (gastroesophageal reflux disease)    Gout    H/O blood clots    Heartburn    Hyperlipidemia    Hypertension    Obesity    Osteoarthritis    Palpitations    SOBOE (shortness of breath on exertion)    Swallowing difficulty    Type 2 diabetes mellitus (HCC)    Vitamin D deficiency     Past Surgical History:  Procedure Laterality Date   ABDOMINAL HYSTERECTOMY     partial    CHOLECYSTECTOMY     COLONOSCOPY WITH PROPOFOL N/A 10/29/2016   Procedure: COLONOSCOPY WITH PROPOFOL;  Surgeon: Benancio Deeds, MD;  Location: [facility];  Service: Gastroenterology;  Laterality: N/A;   COLONOSCOPY WITH PROPOFOL N/A 10/01/2022   Procedure: COLONOSCOPY WITH PROPOFOL;  Surgeon: Benancio Deeds, MD;  Location: [facility];  Service: Gastroenterology;  Laterality: N/A;  BMI = 57   DENTAL SURGERY     ORIF TIBIA & FIBULA FRACTURES Left    POLYPECTOMY  10/01/2022   Procedure: POLYPECTOMY;  Surgeon: Benancio Deeds, MD;  Location: [facility];  Service: Gastroenterology;;    Allergies  Allergen Reactions   Lisinopril Swelling    Throat swelling   Hydrochlorothiazide     Other Reaction(s): sleepy   Tape Other (See Comments)    Certain Band aids cause skin irritation    Outpatient Encounter Medications as of 10/24/2022  Medication Sig   atenolol (TENORMIN) 50 MG tablet TAKE 1 TABLET BY MOUTH DAILY   atorvastatin (LIPITOR) 40 MG tablet Take 1 tablet (40 mg total) by mouth 3 (three) times a week.   Blood Glucose Monitoring Suppl (ACCU-CHEK GUIDE) w/Device KIT 1 Device by Does not apply route  daily. CBG daily, pls give strips and lancets   chlorthalidone (HYGROTON) 25 MG tablet Take 0.5 tablets (12.5 mg total) by mouth every morning.   Cholecalciferol 1.25 MG (50000 UT) capsule Take 1 capsule (50,000 Units total) by mouth once a week.   glucose blood (ACCU-CHEK GUIDE) test strip Use as instructed   olmesartan (BENICAR) 40 MG tablet Take 40 mg by mouth daily.   Semaglutide,0.25 or 0.5MG /DOS, (OZEMPIC, 0.25 OR 0.5 MG/DOSE,) 2 MG/3ML SOPN Inject 0.5 mg into the skin once a week.   glipiZIDE (GLUCOTROL XL) 10 MG 24 hr tablet Take 1 tablet (10 mg total) by mouth daily with breakfast.   No facility-administered encounter medications on file as of 10/24/2022.    Review of Systems:  Review of Systems  Constitutional:  Negative for appetite change, chills, fatigue and  fever.  HENT:  Negative for congestion, hearing loss, rhinorrhea and sore throat.   Eyes: Negative.   Respiratory:  Negative for cough, shortness of breath and wheezing.   Cardiovascular:  Negative for chest pain, palpitations and leg swelling.  Gastrointestinal:  Negative for abdominal pain, constipation, diarrhea, nausea and vomiting.  Genitourinary:  Positive for vaginal discharge. Negative for dysuria.       Has vaginal itch  Musculoskeletal:  Negative for arthralgias, back pain and myalgias.  Skin:  Negative for color change, rash and wound.  Neurological:  Negative for dizziness, weakness and headaches.  Psychiatric/Behavioral:  Negative for behavioral problems. The patient is not nervous/anxious.     Health Maintenance  Topic Date Due   Zoster Vaccines- Shingrix (1 of 2) Never done   Lung Cancer Screening  Never done   PAP SMEAR-Modifier  08/22/2020   INFLUENZA VACCINE  09/12/2022   COVID-19 Vaccine (4 - 2023-24 season) 10/13/2022   HEMOGLOBIN A1C  11/08/2022   Diabetic kidney evaluation - Urine ACR  07/11/2023   FOOT EXAM  07/11/2023   OPHTHALMOLOGY EXAM  07/17/2023   Diabetic kidney evaluation - eGFR measurement  07/30/2023   MAMMOGRAM  08/04/2024   DTaP/Tdap/Td (2 - Td or Tdap) 10/25/2028   Colonoscopy  09/30/2032   Hepatitis C Screening  Completed   HIV Screening  Completed   HPV VACCINES  Aged Out    Physical Exam: Vitals:   10/24/22 1446  BP: 122/80  Pulse: 87  Resp: 17  Temp: 98 F (36.7 C)  TempSrc: Temporal  SpO2: 98%  Weight: (!) 340 lb 9.6 oz (154.5 kg)  Height: 5\' 5"  (1.651 m)   Body mass index is 56.68 kg/m. Physical Exam Constitutional:      Appearance: She is obese.     Comments: Morbidly obese  HENT:     Head: Normocephalic and atraumatic.     Nose: Nose normal.     Mouth/Throat:     Mouth: Mucous membranes are moist.  Eyes:     Conjunctiva/sclera: Conjunctivae normal.  Cardiovascular:     Rate and Rhythm: Normal rate and regular  rhythm.  Pulmonary:     Effort: Pulmonary effort is normal.     Breath sounds: Normal breath sounds.  Abdominal:     General: Bowel sounds are normal.     Palpations: Abdomen is soft.  Musculoskeletal:        General: Normal range of motion.     Cervical back: Normal range of motion.  Skin:    General: Skin is warm and dry.  Neurological:     General: No focal deficit present.     Mental  Status: She is alert and oriented to person, place, and time.  Psychiatric:        Mood and Affect: Mood normal.        Behavior: Behavior normal.        Thought Content: Thought content normal.        Judgment: Judgment normal.     Labs reviewed: Basic Metabolic Panel: Recent Labs    05/08/22 0913 07/30/22 0843  NA 138 138  K 4.0 5.0  CL 103 101  CO2 28 24  GLUCOSE 303* 363*  BUN 20 20  CREATININE 0.71 0.93  CALCIUM 9.8 10.0  TSH  --  2.140   Liver Function Tests: Recent Labs    05/08/22 0913 07/30/22 0843  AST 20 15  ALT 24 18  ALKPHOS  --  101  BILITOT 0.6 0.4  PROT 7.1 6.8  ALBUMIN  --  4.1   No results for input(s): "LIPASE", "AMYLASE" in the last 8760 hours. No results for input(s): "AMMONIA" in the last 8760 hours. CBC: Recent Labs    07/30/22 0843  WBC 5.7  NEUTROABS 3.4  HGB 12.9  HCT 40.2  MCV 94  PLT 204   Lipid Panel: Recent Labs    05/08/22 0913  CHOL 185  HDL 52  LDLCALC 114*  TRIG 90  CHOLHDL 3.6   Lab Results  Component Value Date   HGBA1C 8.7 (H) 05/08/2022    Procedures since last visit: No results found.  Assessment/Plan  1. Vaginal yeast infection -  has vaginal itching and whitish vaginal drainage x 3 weeks - fluconazole (DIFLUCAN) 150 MG tablet; Take 1 tablet (150 mg total) by mouth every 3 (three) days for 6 days. Repeat in 3 days  Dispense: 2 tablet; Refill: 0  2. Type 2 diabetes mellitus with morbid obesity (HCC) Lab Results  Component Value Date   HGBA1C 8.7 (H) 05/08/2022   -  CBGs averaging in the 240s so will  start on Metformin -  continue Ozempic - metFORMIN (GLUCOPHAGE) 500 MG tablet; Take 1 tablet (500 mg total) by mouth 2 (two) times daily with a meal.  Dispense: 180 tablet; Refill: 3 - Hemoglobin A1C  3. Essential hypertension -  BP stable -  continue current medications  4. Other hyperlipidemia Lab Results  Component Value Date   CHOL 185 05/08/2022   HDL 52 05/08/2022   LDLCALC 114 (H) 05/08/2022   TRIG 90 05/08/2022   CHOLHDL 3.6 05/08/2022    -  continue Atorvastatin  5. Morbidly obese (HCC) -  plans to exercise at [facility] pool -  continue Ozempic -  [facility]s [facility]  6. Vitamin D deficiency Last vitamin D Lab Results  Component Value Date   VD25OH 19.8 (L) 07/30/2022    - Vitamin D, 25-hydroxy - Cholecalciferol 1.25 MG (50000 UT) capsule; Take 1 capsule (50,000 Units total) by mouth once a week.  Dispense: 4 capsule; Refill: 2  7. Flu vaccine refused -  declined    Labs/tests ordered:  Vitamin D and A1C  Next appt:  Visit date not found

## 2022-10-25 ENCOUNTER — Other Ambulatory Visit: Payer: Self-pay | Admitting: Adult Health

## 2022-10-25 DIAGNOSIS — E1169 Type 2 diabetes mellitus with other specified complication: Secondary | ICD-10-CM

## 2022-10-25 LAB — HEMOGLOBIN A1C
Hgb A1c MFr Bld: 11.7 %{Hb} — ABNORMAL HIGH (ref <5.7)
Mean Plasma Glucose: 289 mg/dL
eAG (mmol/L): 16 mmol/L

## 2022-10-25 LAB — VITAMIN D 25 HYDROXY (VIT D DEFICIENCY, FRACTURES): Vit D, 25-Hydroxy: 31 ng/mL (ref 30–100)

## 2022-10-25 MED ORDER — METFORMIN HCL 1000 MG PO TABS
1000.0000 mg | ORAL_TABLET | Freq: Two times a day (BID) | ORAL | 1 refills | Status: DC
Start: 2022-10-25 — End: 2022-11-06

## 2022-10-25 NOTE — Progress Notes (Signed)
Vitamin D level normal A1C 11.7, up from 8.7, pls increase Metformin from 500 mg BID to 1000 mg BId

## 2022-10-28 ENCOUNTER — Encounter (INDEPENDENT_AMBULATORY_CARE_PROVIDER_SITE_OTHER): Payer: Self-pay | Admitting: Physician Assistant

## 2022-10-28 ENCOUNTER — Ambulatory Visit (INDEPENDENT_AMBULATORY_CARE_PROVIDER_SITE_OTHER): Payer: Medicaid Other | Admitting: Physician Assistant

## 2022-10-28 VITALS — BP 121/83 | HR 77 | Temp 98.1°F | Ht 64.0 in | Wt 334.0 lb

## 2022-10-28 DIAGNOSIS — Z6841 Body Mass Index (BMI) 40.0 and over, adult: Secondary | ICD-10-CM

## 2022-10-28 DIAGNOSIS — I152 Hypertension secondary to endocrine disorders: Secondary | ICD-10-CM | POA: Insufficient documentation

## 2022-10-28 DIAGNOSIS — E1159 Type 2 diabetes mellitus with other circulatory complications: Secondary | ICD-10-CM | POA: Diagnosis not present

## 2022-10-28 DIAGNOSIS — E559 Vitamin D deficiency, unspecified: Secondary | ICD-10-CM | POA: Diagnosis not present

## 2022-10-28 DIAGNOSIS — E669 Obesity, unspecified: Secondary | ICD-10-CM | POA: Diagnosis not present

## 2022-10-28 DIAGNOSIS — E1165 Type 2 diabetes mellitus with hyperglycemia: Secondary | ICD-10-CM

## 2022-10-28 MED ORDER — OZEMPIC (1 MG/DOSE) 2 MG/1.5ML ~~LOC~~ SOPN
1.0000 mg | PEN_INJECTOR | SUBCUTANEOUS | 0 refills | Status: DC
Start: 2022-10-28 — End: 2022-11-25

## 2022-10-28 NOTE — Progress Notes (Signed)
.[facility]  Office: [phone]  /  Fax: [phone]  WEIGHT SUMMARY AND BIOMETRICS  Vitals Temp: 98.1 F (36.7 C) BP: 121/83 Pulse Rate: 77 SpO2: 98 %   Anthropometric Measurements Height: 5\' 4"  (1.626 m) (re-checked height) Weight: (!) 334 lb (151.5 kg) BMI (Calculated): 57.3 Weight at Last Visit: 336 lb Weight Lost Since Last Visit: 2 lb Weight Gained Since Last Visit: 0 Starting Weight: 335 lb Total Weight Loss (lbs): 1 lb (0.454 kg) Peak Weight: 335 lb   Body Composition  Body Fat %: 53.2 % Fat Mass (lbs): 178 lbs Muscle Mass (lbs): 148.8 lbs Total Body Water (lbs): 102.6 lbs Visceral Fat Rating : 24   Other Clinical Data Fasting: no Labs: no Today's Visit #: 4 Starting Date: 07/30/22 (re-start)     HPI  Chief Complaint: OBESITY  Ruth [name] is here to discuss her progress with her obesity treatment plan. She is on the the Category 3 Plan and states she is following her eating plan approximately 60 % of the time. She states she is exercising walking stairs 10 minutes 7 times per week.  Discussed the use of AI scribe software for clinical note transcription with the patient, who gave verbal consent to proceed.  History of Present Illness /    Interval History:  Since last office visit she down 2 lbs    The patient, a 62 year old with a history of obesity, type 2 diabetes, hypertension, hypercholesterolemia, and vitamin D deficiency, presents for a follow-up visit regarding her obesity treatment plan. She was recently started on Ozempic and has been trying to adhere to the prescribed diet plan. She reports difficulty with the diet due to monotony and cost, leading to some substitutions. She has not noticed a significant decrease in her appetite since starting Ozempic, but notes that she may be Ruth [name] to go longer between meals and snack less. She has been on a dose of 0.5 mg since the beginning of her treatment. She denies constipation or significant nausea, but notes a  change in stool consistency and occasional feelings of nausea, which she is unsure if they are related to the Ozempic or her other medications. She also reports that she sometimes feels nauseous when she is hungry, which resolves after eating.  The patient also has type 2 diabetes, which has been poorly controlled recently with an A1c of 11.7. She has been checking her blood sugars at home, which have been high, but she reports they have been decreasing since starting her new medications. She was recently switched to metformin, with an increase in dose from 500 mg twice a day to 1000 mg due to her elevated A1c. She also mentions that she recently had a colonoscopy, for which she had to temporarily discontinue her Ozempic.     Pharmacotherapy: Ozempic 0.5 mg weekly. Denies mass in neck, dysphagia, dyspepsia, persistent hoarseness, abdominal pain, or N/V/Constipation or diarrhea. Has annual eye exam. Mood is stable.    TREATMENT PLAN FOR OBESITY:  Recommended Dietary Goals  Ruth [name] is currently in the action stage of change. As such, her goal is to continue weight management plan. She has agreed to the Category 3 Plan.  Behavioral Intervention  We discussed the following Behavioral Modification Strategies today: increasing lean protein intake, decreasing simple carbohydrates , increasing vegetables, increasing lower glycemic fruits, increasing fiber rich foods, avoiding skipping meals, increasing water intake, work on meal planning and preparation, continue to practice mindfulness when eating, planning for success, and better snacking choices.  Additional resources  provided today: NA  Recommended Physical Activity Goals  Ruth [name] has been advised to work up to 150 minutes of moderate intensity aerobic activity a week and strengthening exercises 2-3 times per week for cardiovascular health, weight loss maintenance and preservation of muscle mass.   She has agreed to Continue current level of  physical activity  and Think about ways to increase daily physical activity and overcoming barriers to exercise   Pharmacotherapy We discussed various medication options to help Ruth [name] with her weight loss efforts and we both agreed to increase Ozempic to 1 mg weekly for Type 2 diabetes. .    Return in about 4 weeks (around 11/25/2022).Marland Kitchen She was informed of the importance of frequent follow up visits to maximize her success with intensive lifestyle modifications for her multiple health conditions.  PHYSICAL EXAM:  Blood pressure 121/83, pulse 77, temperature 98.1 F (36.7 C), height 5\' 4"  (1.626 m), weight (!) 334 lb (151.5 kg), SpO2 98%. Body mass index is 57.33 kg/m.  General: She is overweight, cooperative, alert, well developed, and in no acute distress. PSYCH: Has normal mood, affect and thought process.   Cardiovascular: HR 70's BP 121/83 Lungs: Normal breathing effort, no conversational dyspnea. Neuro: no focal deficits  DIAGNOSTIC DATA REVIEWED:  BMET    Component Value Date/Time   NA 138 07/30/2022 0843   K 5.0 07/30/2022 0843   CL 101 07/30/2022 0843   CO2 24 07/30/2022 0843   GLUCOSE 363 (H) 07/30/2022 0843   GLUCOSE 303 (H) 05/08/2022 0913   BUN 20 07/30/2022 0843   CREATININE 0.93 07/30/2022 0843   CREATININE 0.71 05/08/2022 0913   CALCIUM 10.0 07/30/2022 0843   GFRNONAA 97 01/18/2020 0744   GFRAA 112 01/18/2020 0744   Lab Results  Component Value Date   HGBA1C 11.7 (H) 10/24/2022   HGBA1C 8.7 04/27/2015   Lab Results  Component Value Date   INSULIN 22.6 07/30/2022   INSULIN 26.5 (H) 01/18/2020   Lab Results  Component Value Date   TSH 2.140 07/30/2022   CBC    Component Value Date/Time   WBC 5.7 07/30/2022 0843   WBC 5.7 03/09/2019 0556   RBC 4.30 07/30/2022 0843   RBC 4.35 03/09/2019 0556   HGB 12.9 07/30/2022 0843   HCT 40.2 07/30/2022 0843   PLT 204 07/30/2022 0843   MCV 94 07/30/2022 0843   MCH 30.0 07/30/2022 0843   MCH 29.0  03/09/2019 0556   MCHC 32.1 07/30/2022 0843   MCHC 32.1 03/09/2019 0556   RDW 12.9 07/30/2022 0843   Iron Studies    Component Value Date/Time   FERRITIN 837 (H) 03/08/2019 0440   Lipid Panel     Component Value Date/Time   CHOL 185 05/08/2022 0913   CHOL 150 05/30/2020 0919   TRIG 90 05/08/2022 0913   HDL 52 05/08/2022 0913   HDL 60 05/30/2020 0919   CHOLHDL 3.6 05/08/2022 0913   VLDL 22.0 08/02/2016 1659   LDLCALC 114 (H) 05/08/2022 0913   Hepatic Function Panel     Component Value Date/Time   PROT 6.8 07/30/2022 0843   ALBUMIN 4.1 07/30/2022 0843   AST 15 07/30/2022 0843   ALT 18 07/30/2022 0843   ALKPHOS 101 07/30/2022 0843   BILITOT 0.4 07/30/2022 0843   BILIDIR 0.12 06/04/2019 1315   IBILI 0.3 08/21/2018 1401      Component Value Date/Time   TSH 2.140 07/30/2022 0843   Nutritional Lab Results  Component Value Date   VD25OH 31  10/24/2022   VD25OH 19.8 (L) 07/30/2022   VD25OH 40.5 01/18/2020    ASSOCIATED CONDITIONS ADDRESSED TODAY  ASSESSMENT AND PLAN  Problem List Items Addressed This Visit     DM (diabetes mellitus) (HCC) - Primary   Relevant Medications   Semaglutide, 1 MG/DOSE, (OZEMPIC, 1 MG/DOSE,) 2 MG/1.5ML SOPN   BMI 50.0-59.9, adult (HCC)   Relevant Medications   Semaglutide, 1 MG/DOSE, (OZEMPIC, 1 MG/DOSE,) 2 MG/1.5ML SOPN   Vitamin D deficiency   Hypertension associated with diabetes (HCC)   Relevant Medications   Semaglutide, 1 MG/DOSE, (OZEMPIC, 1 MG/DOSE,) 2 MG/1.5ML SOPN   Obesity with starting BMI of 57.5   Relevant Medications   Semaglutide, 1 MG/DOSE, (OZEMPIC, 1 MG/DOSE,) 2 MG/1.5ML SOPN   Obesity Struggling with dietary adherence. Noted some improvement in muscle mass and reduction in adipose tissue. -Increase Ozempic to 1mg . -Encourage high protein snacks and adequate hydration. -Continue physical activity, including use of home peddler.  Type 2 Diabetes Elevated A1c (11.7 on 10/24/2022).PCP recently increased Metformin  to 1000mg  due to high blood glucose readings. She has tolerated Ozempic 0.5 mg weekly well . Denies mass in neck, dysphagia, dyspepsia, persistent hoarseness, abdominal pain, or N/V/Constipation or diarrhea. Has annual eye exam. Mood is stable.  Reports FBS of 240's recently. No hypoglycemia.   Refill and increase Ozempic to 1 mg weekly x 1 month and titrate accordingly.  -Continue Metformin 1000mg . -Monitor blood glucose levels at home. -ReCheck A1c over next 2-3 months.   Hypertension Well controlled with current regimen- olmesartan 40 mg daily and chlorthalidone 12.5 mg daily. Renal function normal but declining GFR over the past year.  -Continue current antihypertensive regimen. Continue to work on nutrition plan to promote weight loss and improve BP control.  Monitor BP closely as increasing GLP-1 medication   Vitamin D Deficiency On Ergocalciferol 50,000 units weekly. No N/V or muscle weakness reported.  Plan : Continue Ergocalciferol 50,000 units weekly.  Recheck vitamin D level several times yearly to optimize supplementation/avoid over supplementation.   Follow-up -Scheduled for next visit on 11/25/2022 at 2:20pm. ATTESTASTION STATEMENTS:  Reviewed by clinician on day of visit: allergies, medications, problem list, medical history, surgical history, family history, social history, and previous encounter notes.   I have personally spent 35 minutes total time today in preparation, patient care, nutritional counseling and documentation for this visit, including the following: review of clinical lab tests; review of medical tests/procedures/services.      Chantelle Verdi, PA-C

## 2022-11-04 ENCOUNTER — Encounter: Payer: Medicaid Other | Admitting: Obstetrics & Gynecology

## 2022-11-06 ENCOUNTER — Other Ambulatory Visit: Payer: Self-pay | Admitting: Adult Health

## 2022-11-06 ENCOUNTER — Telehealth: Payer: Self-pay

## 2022-11-06 MED ORDER — METFORMIN HCL 500 MG PO TABS: 500.0000 mg | ORAL_TABLET | Freq: Two times a day (BID) | ORAL | Status: DC

## 2022-11-06 NOTE — Telephone Encounter (Signed)
Patient called stating her Ozempic was recently increased by [facility]) and Medina-Vargas, Monina C, NP recently increased her metformin. Patient states that as a result of both of these medication changes she has had increased nausea and refulsx "Really bad."  Patient is asking if Medina-Vargas, Monina C, NP thinks the metformin can be decreased  Please advise

## 2022-11-06 NOTE — Progress Notes (Signed)
Patient called stating that her Ozempic dosage was increased and Metformin was recently increased. She now feels nauseated. Metformin will be decreased to 500 mg BID. Discussed with patient over the phone.

## 2022-11-06 NOTE — Telephone Encounter (Signed)
Medina-Vargas, Monina C, NP re[ply:  Medina-Vargas, Monina C, NP  You14 minutes ago (4:20 PM)    Metformin dosage was decreased to 500 mg BID. Discussed with her over the phone. FYI.

## 2022-11-13 ENCOUNTER — Other Ambulatory Visit: Payer: Self-pay | Admitting: Adult Health

## 2022-11-13 DIAGNOSIS — E782 Mixed hyperlipidemia: Secondary | ICD-10-CM

## 2022-11-19 ENCOUNTER — Other Ambulatory Visit: Payer: Self-pay | Admitting: Adult Health

## 2022-11-20 ENCOUNTER — Other Ambulatory Visit: Payer: Self-pay | Admitting: Adult Health

## 2022-11-20 DIAGNOSIS — I10 Essential (primary) hypertension: Secondary | ICD-10-CM

## 2022-11-20 MED ORDER — OLMESARTAN MEDOXOMIL 40 MG PO TABS: 40.0000 mg | ORAL_TABLET | Freq: Every day | ORAL | 1 refills | Status: DC

## 2022-11-20 NOTE — Telephone Encounter (Signed)
Patient has request refill on medication that hasn't been given by PCP Medina-Vargas, Monina C, NP . Medication pend and sent to PCP.

## 2022-11-25 ENCOUNTER — Encounter (INDEPENDENT_AMBULATORY_CARE_PROVIDER_SITE_OTHER): Payer: Self-pay | Admitting: Family Medicine

## 2022-11-25 ENCOUNTER — Ambulatory Visit (INDEPENDENT_AMBULATORY_CARE_PROVIDER_SITE_OTHER): Payer: Medicaid Other | Admitting: Family Medicine

## 2022-11-25 VITALS — BP 118/75 | HR 84 | Temp 98.1°F | Ht 64.0 in | Wt 331.0 lb

## 2022-11-25 DIAGNOSIS — E559 Vitamin D deficiency, unspecified: Secondary | ICD-10-CM

## 2022-11-25 DIAGNOSIS — E1165 Type 2 diabetes mellitus with hyperglycemia: Secondary | ICD-10-CM

## 2022-11-25 DIAGNOSIS — E669 Obesity, unspecified: Secondary | ICD-10-CM | POA: Diagnosis not present

## 2022-11-25 DIAGNOSIS — Z7985 Long-term (current) use of injectable non-insulin antidiabetic drugs: Secondary | ICD-10-CM

## 2022-11-25 DIAGNOSIS — Z6841 Body Mass Index (BMI) 40.0 and over, adult: Secondary | ICD-10-CM | POA: Diagnosis not present

## 2022-11-25 DIAGNOSIS — Z7984 Long term (current) use of oral hypoglycemic drugs: Secondary | ICD-10-CM

## 2022-11-25 MED ORDER — CHOLECALCIFEROL 1.25 MG (50000 UT) PO CAPS: 50000.0000 [IU] | ORAL_CAPSULE | ORAL | 2 refills | Status: DC

## 2022-11-25 MED ORDER — OZEMPIC (1 MG/DOSE) 2 MG/1.5ML ~~LOC~~ SOPN
1.0000 mg | PEN_INJECTOR | SUBCUTANEOUS | 0 refills | Status: DC
Start: 2022-11-25 — End: 2022-12-19

## 2022-11-25 NOTE — Progress Notes (Unsigned)
Chief Complaint:   OBESITY Ruth [name] is here to discuss her progress with her obesity treatment plan along with follow-up of her obesity related diagnoses. Ruth [name] is on the Category 3 Plan and states she is following her eating plan approximately 60% of the time. Ruth [name] states she is doing 0 minutes 0 times per week.  Today's visit was #: 20 Starting weight: 336 lbs Starting date: 08/13/2018 Today's weight: 331 lbs Today's date: 11/25/2022 Total lbs lost to date: 5 Total lbs lost since last in-office visit: 3  Interim History: Patient saw Shawn for last appointment.  She has been doing mostly the same food wise.  She is experiencing significant leg pain that she is getting looked at tomorrow.  She had a colonoscopy in the last few weeks.  She tried to go up to the twice a day Metformin and is up to the 1mg  of Ozempic. She mentions she couldn't tolerate the higher metformin dose.  The Ozempic is helping with her appetite.  Foodwise she is Ruth [name] to get in the eggs for breakfast. She is adding cereal occasionally. Sometimes she can't eat everything at time of her meals.   Subjective:   1. Type 2 diabetes mellitus with hyperglycemia, without long-term current use of insulin (HCC) Patient is on metformin and Ozempic.  She could not tolerate the increased dose of metformin.  She has decreased her total carbohydrate intake.  Her last A1c was 11.7 in September.  2. Vitamin D deficiency Patient is on prescription vitamin D, and she denies nausea, vomiting, or muscle weakness but notes fatigue.  Assessment/Plan:   1. Type 2 diabetes mellitus with hyperglycemia, without long-term current use of insulin (HCC) We will refill Ozempic 1 mg subcu weekly for 1 month.  Patient is to bring in her fasting blood sugar log to her next appointment.  - Semaglutide, 1 MG/DOSE, (OZEMPIC, 1 MG/DOSE,) 2 MG/1.5ML SOPN; Inject 1 mg into the skin once a week.  Dispense: 3 mL; Refill: 0  2. Vitamin D deficiency We  will refill vitamin D 50,000 IU once weekly for 90 days.  - Cholecalciferol 1.25 MG (50000 UT) capsule; Take 1 capsule (50,000 Units total) by mouth once a week.  Dispense: 4 capsule; Refill: 2  3. BMI 50.0-59.9, adult (HCC)  4. Obesity with starting BMI of 57.6 Ruth [name] is currently in the action stage of change. As such, her goal is to continue with weight loss efforts. She has agreed to the Category 3 Plan.   Exercise goals: No exercise has been prescribed at this time.  Behavioral modification strategies: increasing lean protein intake, meal planning and cooking strategies, keeping healthy foods in the home, and planning for success.  Ruth [name] has agreed to follow-up with our clinic in 2 to 3 weeks. She was informed of the importance of frequent follow-up visits to maximize her success with intensive lifestyle modifications for her multiple health conditions.   Objective:   Blood pressure 118/75, pulse 84, temperature 98.1 F (36.7 C), height 5\' 4"  (1.626 m), weight (!) 331 lb (150.1 kg), SpO2 99%. Body mass index is 56.82 kg/m.  General: Cooperative, alert, well developed, in no acute distress. HEENT: Conjunctivae and lids unremarkable. Cardiovascular: Regular rhythm.  Lungs: Normal work of breathing. Neurologic: No focal deficits.   Lab Results  Component Value Date   CREATININE 0.93 07/30/2022   BUN 20 07/30/2022   NA 138 07/30/2022   K 5.0 07/30/2022   CL 101 07/30/2022   CO2 24 07/30/2022  Lab Results  Component Value Date   ALT 18 07/30/2022   AST 15 07/30/2022   ALKPHOS 101 07/30/2022   BILITOT 0.4 07/30/2022   Lab Results  Component Value Date   HGBA1C 11.7 (H) 10/24/2022   HGBA1C 8.7 (H) 05/08/2022   HGBA1C 6.8 (H) 05/30/2020   HGBA1C 6.7 (H) 01/18/2020   HGBA1C 7.0 (H) 09/24/2019   Lab Results  Component Value Date   INSULIN 22.6 07/30/2022   INSULIN 26.5 (H) 01/18/2020   Lab Results  Component Value Date   TSH 2.140 07/30/2022   Lab Results   Component Value Date   CHOL 185 05/08/2022   HDL 52 05/08/2022   LDLCALC 114 (H) 05/08/2022   TRIG 90 05/08/2022   CHOLHDL 3.6 05/08/2022   Lab Results  Component Value Date   VD25OH 31 10/24/2022   VD25OH 19.8 (L) 07/30/2022   VD25OH 40.5 01/18/2020   Lab Results  Component Value Date   WBC 5.7 07/30/2022   HGB 12.9 07/30/2022   HCT 40.2 07/30/2022   MCV 94 07/30/2022   PLT 204 07/30/2022   Lab Results  Component Value Date   FERRITIN 837 (H) 03/08/2019   Attestation Statements:   Reviewed by clinician on day of visit: allergies, medications, problem list, medical history, surgical history, family history, social history, and previous encounter notes.   I, Burt Knack, am acting as transcriptionist for Reuben Likes, MD. I have reviewed the above documentation for accuracy and completeness, and I agree with the above. - Reuben Likes, MD

## 2022-11-26 ENCOUNTER — Encounter (INDEPENDENT_AMBULATORY_CARE_PROVIDER_SITE_OTHER): Payer: Medicaid Other | Admitting: Sports Medicine

## 2022-11-27 NOTE — Progress Notes (Signed)
Error   This encounter was created in error - please disregard.

## 2022-11-29 ENCOUNTER — Ambulatory Visit
Admission: RE | Admit: 2022-11-29 | Discharge: 2022-11-29 | Disposition: A | Discharge status: Home / Self Care | Payer: Medicaid Other | Source: Ambulatory Visit | Attending: Family | Admitting: Family

## 2022-11-29 ENCOUNTER — Ambulatory Visit (INDEPENDENT_AMBULATORY_CARE_PROVIDER_SITE_OTHER): Payer: Medicaid Other | Admitting: Family

## 2022-11-29 ENCOUNTER — Encounter: Payer: Self-pay | Admitting: Family

## 2022-11-29 VITALS — BP 120/80 | HR 86 | Temp 97.6°F | Resp 20 | Ht 64.0 in | Wt 338.6 lb

## 2022-11-29 DIAGNOSIS — M25562 Pain in left knee: Secondary | ICD-10-CM | POA: Diagnosis not present

## 2022-11-29 DIAGNOSIS — M5432 Sciatica, left side: Secondary | ICD-10-CM

## 2022-11-29 MED ORDER — PREDNISONE 20 MG PO TABS
ORAL_TABLET | ORAL | 0 refills | Status: AC
Start: 2022-11-29 — End: 2022-12-05

## 2022-11-29 NOTE — Patient Instructions (Signed)
-   Please get left knee X-ray at [facility] then will call you with results. ? ?

## 2022-11-29 NOTE — Progress Notes (Signed)
Provider: Richarda Blade FNP-C  Medina-Vargas, Margit Banda, NP  Patient Care Team: Gillis Santa, NP as PCP - General (Internal Medicine)  Extended Emergency Contact Information Primary Emergency Contact: Buttrey,Howard Address: [address]          [place], [place] [address] [place] of [place] Home Phone: [phone] Mobile Phone: [phone] Relation: Spouse Secondary Emergency Contact: Potts,Kevita Mobile Phone: [phone] Relation: Daughter  Code Status:  Full Code  Goals of care: Advanced Directive information    11/29/2022    3:17 PM  Advanced Directives  Does Patient Have a Medical Advance Directive? No     Chief Complaint  Patient presents with   Acute Visit    Discuss leg pain back of knee - sciatica possible    HPI:  Pt is a 62 y.o. female seen today for an acute visit for evaluation of left knee and left sciatic pain. Left buttock painful and tender to press.Pain runs down along the back of the thigh and to the knee. Hurts to walk and aches when lying down. Has never had knee pain before. Worst at the back of the knee.  She denies any weakness,numbness or tingling.   Past Medical History:  Diagnosis Date   Anemia    Arthritis    Back pain    Bilateral calcaneal spurs    Bilateral lower extremity edema    Chest pain    Diabetes (HCC)    Diabetes mellitus without complication (HCC)    type 2   Gallbladder problem    GERD (gastroesophageal reflux disease)    Gout    H/O blood clots    Heartburn    Hyperlipidemia    Hypertension    Obesity    Osteoarthritis    Palpitations    SOBOE (shortness of breath on exertion)    Swallowing difficulty    Type 2 diabetes mellitus (HCC)    Vitamin D deficiency    Past Surgical History:  Procedure Laterality Date   ABDOMINAL HYSTERECTOMY     partial   CHOLECYSTECTOMY     COLONOSCOPY WITH PROPOFOL N/A 10/29/2016   Procedure: COLONOSCOPY WITH PROPOFOL;  Surgeon: Benancio Deeds, MD;   Location: [facility];  Service: Gastroenterology;  Laterality: N/A;   COLONOSCOPY WITH PROPOFOL N/A 10/01/2022   Procedure: COLONOSCOPY WITH PROPOFOL;  Surgeon: Benancio Deeds, MD;  Location: [facility];  Service: Gastroenterology;  Laterality: N/A;  BMI = 57   DENTAL SURGERY     ORIF TIBIA & FIBULA FRACTURES Left    POLYPECTOMY  10/01/2022   Procedure: POLYPECTOMY;  Surgeon: Benancio Deeds, MD;  Location: [facility];  Service: Gastroenterology;;    Allergies  Allergen Reactions   Lisinopril Swelling    Throat swelling   Hydrochlorothiazide     Other Reaction(s): sleepy   Tape Other (See Comments)    Certain Band aids cause skin irritation    Outpatient Encounter Medications as of 11/29/2022  Medication Sig   atenolol (TENORMIN) 50 MG tablet TAKE 1 TABLET BY MOUTH DAILY   atorvastatin (LIPITOR) 40 MG tablet TAKE 1 TABLET BY MOUTH 3 TIMES A WEEK   Blood Glucose Monitoring Suppl (ACCU-CHEK GUIDE) w/Device KIT 1 Device by Does not apply route daily. CBG daily, pls give strips and lancets   chlorthalidone (HYGROTON) 25 MG tablet Take 0.5 tablets (12.5 mg total) by mouth every morning.   Cholecalciferol 1.25 MG (50000 UT) capsule Take 1 capsule (50,000 Units total) by mouth once a week.  glucose blood (ACCU-CHEK GUIDE) test strip Use as instructed   metFORMIN (GLUCOPHAGE) 500 MG tablet Take 1 tablet (500 mg total) by mouth 2 (two) times daily with a meal.   olmesartan (BENICAR) 40 MG tablet Take 1 tablet (40 mg total) by mouth daily.   Semaglutide, 1 MG/DOSE, (OZEMPIC, 1 MG/DOSE,) 2 MG/1.5ML SOPN Inject 1 mg into the skin once a week.   No facility-administered encounter medications on file as of 11/29/2022.    Review of Systems  Constitutional:  Negative for appetite change, chills, fatigue, fever and unexpected weight change.  Respiratory:  Negative for cough, chest tightness, shortness of breath and wheezing.   Cardiovascular:  Negative for chest pain, palpitations  and leg swelling.  Gastrointestinal:  Negative for abdominal distention, abdominal pain, constipation, nausea and vomiting.  Musculoskeletal:  Positive for arthralgias. Negative for back pain, gait problem, joint swelling and myalgias.       Left knee pain   Neurological:  Negative for dizziness, weakness, light-headedness, numbness and headaches.    Immunization History  Administered Date(s) Administered   Fluad Quad(high Dose 65+) 10/26/2018   Hepatitis B, PED/ADOLESCENT 02/17/2015   Influenza,inj,Quad PF,6+ Mos 12/14/2021   PFIZER(Purple Top)SARS-COV-2 Vaccination 04/16/2019, 05/18/2019, 01/29/2020   PNEUMOCOCCAL CONJUGATE-20 09/14/2021   Pneumococcal Polysaccharide-23 10/26/2018   Tdap 10/26/2018   Pertinent  Health Maintenance Due  Topic Date Due   INFLUENZA VACCINE  09/12/2022   HEMOGLOBIN A1C  04/23/2023   FOOT EXAM  07/11/2023   OPHTHALMOLOGY EXAM  07/17/2023   MAMMOGRAM  08/04/2024   Colonoscopy  09/30/2032      06/06/2022    2:25 PM 07/11/2022    2:10 PM 09/02/2022    2:09 PM 10/24/2022    2:54 PM 11/29/2022    3:17 PM  Fall Risk  Falls in the past year? 0 0 0 0 0  Was there an injury with Fall? 0 0 0 0   Fall Risk Category Calculator 0 0 0 0   Patient at Risk for Falls Due to No Fall Risks No Fall Risks No Fall Risks No Fall Risks   Fall risk Follow up Falls evaluation completed Falls evaluation completed Falls evaluation completed Falls evaluation completed    Functional Status Survey:    Vitals:   11/29/22 1518  BP: 120/80  Pulse: 86  Resp: 20  Temp: 97.6 F (36.4 C)  SpO2: 98%  Weight: (!) 338 lb 9.6 oz (153.6 kg)  Height: 5\' 4"  (1.626 m)   Body mass index is 58.12 kg/m. Physical Exam Vitals reviewed.  Constitutional:      General: She is not in acute distress.    Appearance: Normal appearance. She is morbidly obese. She is not ill-appearing or diaphoretic.  HENT:     Head: Normocephalic.  Eyes:     General: No scleral icterus.       Right  eye: No discharge.        Left eye: No discharge.     Conjunctiva/sclera: Conjunctivae normal.     Pupils: Pupils are equal, round, and reactive to light.  Cardiovascular:     Rate and Rhythm: Normal rate and regular rhythm.     Pulses: Normal pulses.     Heart sounds: Normal heart sounds. No murmur heard.    No friction rub. No gallop.  Pulmonary:     Effort: Pulmonary effort is normal. No respiratory distress.     Breath sounds: Normal breath sounds. No wheezing, rhonchi or rales.  Chest:  Chest wall: No tenderness.  Abdominal:     General: Bowel sounds are normal. There is no distension.     Palpations: Abdomen is soft. There is no mass.     Tenderness: There is no abdominal tenderness. There is no right CVA tenderness, left CVA tenderness, guarding or rebound.  Musculoskeletal:        General: No swelling. Normal range of motion.     Right hip: Normal.     Left hip: Tenderness present. No crepitus. Normal range of motion. Normal strength.     Right knee: Normal.     Left knee: No swelling, effusion, erythema or ecchymosis. Normal range of motion. Tenderness present.     Right lower leg: No edema.     Left lower leg: No edema.  Skin:    General: Skin is warm and dry.     Coloration: Skin is not pale.     Findings: No bruising, erythema, lesion or rash.  Neurological:     Mental Status: She is alert and oriented to person, place, and time.     Cranial Nerves: No cranial nerve deficit.     Sensory: No sensory deficit.     Motor: No weakness.     Comments: Limbing due to pain   Psychiatric:        Mood and Affect: Mood normal.        Speech: Speech normal.        Behavior: Behavior normal.     Labs reviewed: Recent Labs    05/08/22 0913 07/30/22 0843  NA 138 138  K 4.0 5.0  CL 103 101  CO2 28 24  GLUCOSE 303* 363*  BUN 20 20  CREATININE 0.71 0.93  CALCIUM 9.8 10.0   Recent Labs    05/08/22 0913 07/30/22 0843  AST 20 15  ALT 24 18  ALKPHOS  --  101   BILITOT 0.6 0.4  PROT 7.1 6.8  ALBUMIN  --  4.1   Recent Labs    07/30/22 0843  WBC 5.7  NEUTROABS 3.4  HGB 12.9  HCT 40.2  MCV 94  PLT 204   Lab Results  Component Value Date   TSH 2.140 07/30/2022   Lab Results  Component Value Date   HGBA1C 11.7 (H) 10/24/2022   Lab Results  Component Value Date   CHOL 185 05/08/2022   HDL 52 05/08/2022   LDLCALC 114 (H) 05/08/2022   TRIG 90 05/08/2022   CHOLHDL 3.6 05/08/2022    Significant Diagnostic Results in last 30 days:  No results found.  Assessment/Plan 1. Acute pain of left knee Tender to palpation.No effusion or erythema noted  - DG Knee Complete 4 Views Left; Future  2. Left sciatic nerve pain Pain radiates from hip to left leg consistent with sciatic nerve  Start on prednisone as below  - predniSONE (DELTASONE) 20 MG tablet; Take 2 tablets (40 mg total) by mouth daily with breakfast for 1 day, THEN 1.5 tablets (30 mg total) daily with breakfast for 1 day, THEN 1.5 tablets (30 mg total) daily with breakfast for 1 day, THEN 1 tablet (20 mg total) daily with breakfast for 1 day, THEN 1 tablet (20 mg total) daily with breakfast for 1 day, THEN 0.5 tablets (10 mg total) daily with breakfast for 1 day.  Dispense: 7.5 tablet; Refill: 0  Family/ staff Communication: Reviewed plan of care with patient verbalized understanding  Labs/tests ordered: None   Next Appointment: Return if symptoms worsen  or fail to improve.   Caesar Bookman, NP

## 2022-12-05 ENCOUNTER — Telehealth (INDEPENDENT_AMBULATORY_CARE_PROVIDER_SITE_OTHER): Payer: Medicaid Other | Admitting: Adult Health

## 2022-12-05 DIAGNOSIS — I10 Essential (primary) hypertension: Secondary | ICD-10-CM

## 2022-12-05 DIAGNOSIS — E1169 Type 2 diabetes mellitus with other specified complication: Secondary | ICD-10-CM | POA: Diagnosis not present

## 2022-12-05 DIAGNOSIS — M25562 Pain in left knee: Secondary | ICD-10-CM

## 2022-12-05 DIAGNOSIS — Z7984 Long term (current) use of oral hypoglycemic drugs: Secondary | ICD-10-CM

## 2022-12-05 DIAGNOSIS — Z7985 Long-term (current) use of injectable non-insulin antidiabetic drugs: Secondary | ICD-10-CM

## 2022-12-05 NOTE — Progress Notes (Signed)
This service is provided via telemedicine  No vital signs collected/recorded due to the encounter was a telemedicine visit.   Location of patient (ex: home, work):  Home   Patient consents to a telephone visit:  Yes, 12/05/22   Location of the provider (ex: office, home):  [facility]  Name of any referring provider:  Medina-Vargas, Margit Banda, NP   Names of all persons participating in the telemedicine service and their role in the encounter: Bethany B/CMA, Medina-Vargas, Winona Sison C, NP , and patient  Time spent on call:  11 minutes       DATE:  12/05/2022 MRN:  [id number]  BIRTHDAY: 04/27/1960   Contact Information     Name Relation Home Work Mobile   Mcgilvery,Howard Spouse [phone]  [phone]   Potts,Kevita Daughter   [phone]      Other Contacts   None on File      Code Status History     Date Active Date Inactive Code Status Order ID Comments User Context   03/06/2019 0040 03/09/2019 2140 Full Code [id number]  Therisa Doyne, MD Inpatient        Chief Complaint  Patient presents with   Medical Management of Chronic Issues     discuss xray/ Concerns     HISTORY OF PRESENT ILLNESS: This is a 62 year old female who had a video visit regarding her left knee. She stated that she had been having pain on her knee, front and back. She was last seen at [facility] on 11/29/22 for left knee pain. She was started on Prednisone X 10 days. X-ray of left knee done on 10/18 and was negative for fracture.   Yesterday, she stated that she was trying to get up the stairs and she heard something popped on her left knee and her left knee is sore to touch. She uses a cane when walking. She stated that every time she puts a pressure on her left knee, it hurts too much, 10/10. She denies redness but area feels warm to her touch  CBG this morning 131 and the lowest CBG is 121. She takes Metformin and Semaglutide injection.  BP 120/80, this morning,  takes Olmesartan, Hygroton and Atenolol for hypertension.   PAST MEDICAL HISTORY:  Past Medical History:  Diagnosis Date   Anemia    Arthritis    Back pain    Bilateral calcaneal spurs    Bilateral lower extremity edema    Chest pain    Diabetes (HCC)    Diabetes mellitus without complication (HCC)    type 2   Gallbladder problem    GERD (gastroesophageal reflux disease)    Gout    H/O blood clots    Heartburn    Hyperlipidemia    Hypertension    Obesity    Osteoarthritis    Palpitations    SOBOE (shortness of breath on exertion)    Swallowing difficulty    Type 2 diabetes mellitus (HCC)    Vitamin D deficiency      CURRENT MEDICATIONS: Reviewed  Patient's Medications  New Prescriptions   No medications on file  Previous Medications   ATENOLOL (TENORMIN) 50 MG TABLET    TAKE 1 TABLET BY MOUTH DAILY   ATORVASTATIN (LIPITOR) 40 MG TABLET    TAKE 1 TABLET BY MOUTH 3 TIMES A WEEK   BLOOD GLUCOSE MONITORING SUPPL (ACCU-CHEK GUIDE) W/DEVICE KIT    1 Device by Does not apply route daily. CBG daily, pls give strips and  lancets   CHLORTHALIDONE (HYGROTON) 25 MG TABLET    Take 0.5 tablets (12.5 mg total) by mouth every morning.   CHOLECALCIFEROL 1.25 MG (50000 UT) CAPSULE    Take 1 capsule (50,000 Units total) by mouth once a week.   GLUCOSE BLOOD (ACCU-CHEK GUIDE) TEST STRIP    Use as instructed   METFORMIN (GLUCOPHAGE) 500 MG TABLET    Take 1 tablet (500 mg total) by mouth 2 (two) times daily with a meal.   OLMESARTAN (BENICAR) 40 MG TABLET    Take 1 tablet (40 mg total) by mouth daily.   PREDNISONE (DELTASONE) 20 MG TABLET    Take 2 tablets (40 mg total) by mouth daily with breakfast for 1 day, THEN 1.5 tablets (30 mg total) daily with breakfast for 1 day, THEN 1.5 tablets (30 mg total) daily with breakfast for 1 day, THEN 1 tablet (20 mg total) daily with breakfast for 1 day, THEN 1 tablet (20 mg total) daily with breakfast for 1 day, THEN 0.5 tablets (10 mg total) daily with  breakfast for 1 day.   SEMAGLUTIDE, 1 MG/DOSE, (OZEMPIC, 1 MG/DOSE,) 2 MG/1.5ML SOPN    Inject 1 mg into the skin once a week.  Modified Medications   No medications on file  Discontinued Medications   No medications on file     Allergies  Allergen Reactions   Lisinopril Swelling    Throat swelling   Hydrochlorothiazide     Other Reaction(s): sleepy   Tape Other (See Comments)    Certain Band aids cause skin irritation     REVIEW OF SYSTEMS:  GENERAL: no change in appetite, no fatigue, no weight changes, no fever, chills or weakness SKIN: Denies rash, itching, wounds, ulcer sores, or nail abnormality EYES: Denies change in vision, dry eyes, eye pain, itching or discharge EARS: Denies change in hearing, ringing in ears, or earache NOSE: Denies nasal congestion or epistaxis MOUTH and THROAT: Denies oral discomfort, gingival pain or bleeding, pain from teeth or hoarseness   RESPIRATORY: no cough, SOB, DOE, wheezing, hemoptysis CARDIAC: no chest pain, edema or palpitations GI: no abdominal pain, diarrhea, constipation, heart burn, nausea or vomiting GU: Denies dysuria, frequency, hematuria, incontinence, or discharge MUSCULOSKELETAL: Left knee tenderness and pain when putting pressure NEUROLOGICAL: Denies dizziness, syncope, numbness, or headache PSYCHIATRIC: Denies feeling of depression or anxiety. No report of hallucinations, insomnia, paranoia, or agitation      LABS/RADIOLOGY: Labs reviewed: Basic Metabolic Panel: Recent Labs    05/08/22 0913 07/30/22 0843  NA 138 138  K 4.0 5.0  CL 103 101  CO2 28 24  GLUCOSE 303* 363*  BUN 20 20  CREATININE 0.71 0.93  CALCIUM 9.8 10.0   Liver Function Tests: Recent Labs    05/08/22 0913 07/30/22 0843  AST 20 15  ALT 24 18  ALKPHOS  --  101  BILITOT 0.6 0.4  PROT 7.1 6.8  ALBUMIN  --  4.1   No results for input(s): "LIPASE", "AMYLASE" in the last 8760 hours. No results for input(s): "AMMONIA" in the last 8760  hours. CBC: Recent Labs    07/30/22 0843  WBC 5.7  NEUTROABS 3.4  HGB 12.9  HCT 40.2  MCV 94  PLT 204   A1C: Invalid input(s): "A1C" Lipid Panel: Recent Labs    05/08/22 0913  HDL 52   Cardiac Enzymes: No results for input(s): "CKTOTAL", "CKMB", "CKMBINDEX", "TROPONINI" in the last 8760 hours. BNP: Invalid input(s): "POCBNP" CBG: Recent Labs  10/01/22 0700  GLUCAP 201*      DG Knee Complete 4 Views Left  Result Date: 11/29/2022 CLINICAL DATA:  Knee pain for a week. EXAM: LEFT KNEE - COMPLETE 4 VIEW COMPARISON:  None Available. FINDINGS: No evidence of fracture, dislocation, or joint effusion. No evidence of arthropathy or other focal bone abnormality. Soft tissues are unremarkable. IMPRESSION: Negative. Electronically Signed   By: Layla Maw M.D.   On: 11/29/2022 22:38    ASSESSMENT/PLAN:  1. Acute pain of left knee -  continue Prednisone -  Ice pack to left knee BID - DG Knee Complete 4 Views Left; Future to rule out fracture  2. Type 2 diabetes mellitus with morbid obesity (HCC) Lab Results  Component Value Date   HGBA1C 11.7 (H) 10/24/2022   -  continue Metformin and Semaglutide -  monitor CBGs  3. Essential hypertension -  BP stable -  continue Hygroton, Olmesartan and Atenolol      Time spent on non face to face visit:  11 minutes  The patient gave consent to this video visit. Explained to the patient the risk and privacy issue that was involved with this video call.   The patient was advised to call back and ask for an in-person evaluation if the symptoms worsen or if the condition fails to improve.   Kenard Gower, NP [employer] [phone]

## 2022-12-05 NOTE — Progress Notes (Signed)
I connected with  Ruth [name] on 12/05/22 by a video enabled telemedicine application and verified that I am speaking with the correct person using two identifiers.   I discussed the limitations of evaluation and management by telemedicine. The patient expressed understanding and agreed to proceed.

## 2022-12-06 ENCOUNTER — Ambulatory Visit
Admission: RE | Admit: 2022-12-06 | Discharge: 2022-12-06 | Disposition: A | Discharge status: Home / Self Care | Payer: Medicaid Other | Source: Ambulatory Visit | Attending: Adult Health | Admitting: Adult Health

## 2022-12-06 DIAGNOSIS — M25562 Pain in left knee: Secondary | ICD-10-CM

## 2022-12-13 ENCOUNTER — Other Ambulatory Visit: Payer: Self-pay | Admitting: Adult Health

## 2022-12-13 NOTE — Telephone Encounter (Signed)
Patient is requesting medication refill on medication pending but its showing a allergy pop up.

## 2022-12-13 NOTE — Telephone Encounter (Signed)
Patient would like a refill on the medication pended but its showing the

## 2022-12-16 ENCOUNTER — Ambulatory Visit (INDEPENDENT_AMBULATORY_CARE_PROVIDER_SITE_OTHER): Payer: Medicaid Other | Admitting: Family Medicine

## 2022-12-18 ENCOUNTER — Ambulatory Visit (INDEPENDENT_AMBULATORY_CARE_PROVIDER_SITE_OTHER): Payer: Medicaid Other | Admitting: Family Medicine

## 2022-12-19 ENCOUNTER — Encounter (INDEPENDENT_AMBULATORY_CARE_PROVIDER_SITE_OTHER): Payer: Self-pay | Admitting: Family Medicine

## 2022-12-19 ENCOUNTER — Ambulatory Visit (INDEPENDENT_AMBULATORY_CARE_PROVIDER_SITE_OTHER): Payer: Medicaid Other | Admitting: Family Medicine

## 2022-12-19 DIAGNOSIS — Z7984 Long term (current) use of oral hypoglycemic drugs: Secondary | ICD-10-CM

## 2022-12-19 DIAGNOSIS — E1159 Type 2 diabetes mellitus with other circulatory complications: Secondary | ICD-10-CM

## 2022-12-19 DIAGNOSIS — I152 Hypertension secondary to endocrine disorders: Secondary | ICD-10-CM

## 2022-12-19 DIAGNOSIS — Z7985 Long-term (current) use of injectable non-insulin antidiabetic drugs: Secondary | ICD-10-CM

## 2022-12-19 DIAGNOSIS — I10 Essential (primary) hypertension: Secondary | ICD-10-CM | POA: Diagnosis not present

## 2022-12-19 DIAGNOSIS — E559 Vitamin D deficiency, unspecified: Secondary | ICD-10-CM | POA: Diagnosis not present

## 2022-12-19 DIAGNOSIS — E669 Obesity, unspecified: Secondary | ICD-10-CM

## 2022-12-19 DIAGNOSIS — E1165 Type 2 diabetes mellitus with hyperglycemia: Secondary | ICD-10-CM | POA: Diagnosis not present

## 2022-12-19 DIAGNOSIS — Z6841 Body Mass Index (BMI) 40.0 and over, adult: Secondary | ICD-10-CM

## 2022-12-19 MED ORDER — CHLORTHALIDONE 25 MG PO TABS: 12.5000 mg | ORAL_TABLET | Freq: Every morning | ORAL | 2 refills | Status: DC

## 2022-12-19 MED ORDER — OZEMPIC (1 MG/DOSE) 2 MG/1.5ML ~~LOC~~ SOPN: 1.0000 mg | PEN_INJECTOR | SUBCUTANEOUS | 0 refills | Status: DC

## 2022-12-19 MED ORDER — CHOLECALCIFEROL 1.25 MG (50000 UT) PO CAPS: 50000.0000 [IU] | ORAL_CAPSULE | ORAL | 2 refills | Status: DC

## 2022-12-19 MED ORDER — METFORMIN HCL ER 750 MG PO TB24: 750.0000 mg | ORAL_TABLET | Freq: Every day | ORAL | 0 refills | Status: DC

## 2022-12-19 MED ORDER — ATENOLOL 50 MG PO TABS
50.0000 mg | ORAL_TABLET | Freq: Every day | ORAL | 1 refills | Status: DC
Start: 2022-12-19 — End: 2023-02-27

## 2022-12-19 NOTE — Assessment & Plan Note (Signed)
Blood pressure well controlled today.  No chest pain, chest pressure or headache. She is on atenolol, olmesartan and chlorthalidone and needs refills of both.  No side effects of either medication mentioned by patient.

## 2022-12-19 NOTE — Assessment & Plan Note (Signed)
Blood sugars have been elevated but she has not been taking her second dose of metformin.  She is having some blood sugars in the 300s.  She occasionally is having reflux on Ozempic and cramping and bloating on Metformin.  She needs a refill of both her medications.

## 2022-12-19 NOTE — Progress Notes (Signed)
   SUBJECTIVE:  Chief Complaint: Obesity  Interim History: Patient thought she was doing well and her blood sugars are not going down as she was expecting it.  She has been eating more fruit and more vegetables but not necessarily with some of the foods she needs to eat.  She has fallen off a bit with following the Category 3.  She recognizes she is eating more but likely just not eating enough. She hasn't been using the scale the way she realizes she needs to. She wants to follow her meal plan a bit more strict in the next few weeks.  Her biggest obstacle is when she eats with the family.   Ruth [name] is here to discuss her progress with her obesity treatment plan. She is on the Category 3 Plan and states she is following her eating plan approximately 60 % of the time. She states she is not exercising.  OBJECTIVE: Visit Diagnoses: Problem List Items Addressed This Visit       Cardiovascular and Mediastinum   Hypertension associated with diabetes (HCC)   Blood pressure well controlled today.  No chest pain, chest pressure or headache. She is on atenolol, olmesartan and chlorthalidone and needs refills of both.  No side effects of either medication mentioned by patient.       Relevant Medications   atenolol (TENORMIN) 50 MG tablet   chlorthalidone (HYGROTON) 25 MG tablet     Endocrine   DM (diabetes mellitus) (HCC)   Last A1c of over 11 and consistently elevated blood sugars.  We discussed importance of carb intake control and ensuring protein intake to help patient control her blood sugar numbers.  She understands.  She is on ozempic and metformin.  Needs repeat labs at next appointment.          Other   BMI 50.0-59.9, adult (HCC)   Vitamin D deficiency   Last Vitamin D level of 19.8 in June of this year. Patient experiencing some fatigue but no nausea, vomiting or muscle weakness.  Needs a refill of her Rx today.      Obesity with starting BMI of 57.5 - Primary   Other Visit  Diagnoses       HTN (hypertension), benign       Relevant Medications   atenolol (TENORMIN) 50 MG tablet   chlorthalidone (HYGROTON) 25 MG tablet       No data recorded No data recorded No data recorded No data recorded   ASSESSMENT AND PLAN:  Diet: Ruth [name] is currently in the action stage of change. As such, her goal is to maintain weight for now. She has agreed to Category 3 Plan.  Exercise: Ruth [name] has been instructed that some exercise is better than none for weight loss and overall health benefits.   Behavior Modification:  We discussed the following Behavioral Modification Strategies today: increasing lean protein intake, decreasing simple carbohydrates, meal planning and cooking strategies, and holiday eating strategies.   No follow-ups on file.Marland Kitchen She was informed of the importance of frequent follow up visits to maximize her success with intensive lifestyle modifications for her multiple health conditions.  Attestation Statements:   Reviewed by clinician on day of visit: allergies, medications, problem list, medical history, surgical history, family history, social history, and previous encounter notes.    Reuben Likes, MD

## 2022-12-27 ENCOUNTER — Telehealth: Payer: Self-pay

## 2022-12-27 NOTE — Telephone Encounter (Signed)
Patient called stating she needs her xray results ASAP that were completed on 12/06/22 as the disability department is trying to order xrays again for Monday.  Patient was informed that we do not have the results, however I will call the radiology reading room to see when results can be expected.  Outgoing call placed to [phone], I was told that they will have the radiologist read xray as soon as possible. Message will be sent to Medina-Vargas, Margit Banda, NP

## 2022-12-27 NOTE — Progress Notes (Signed)
-   imaging showed no fracture or dislocation of left knee.

## 2022-12-27 NOTE — Telephone Encounter (Signed)
Left message on voicemail for patient to return call when available . See Xray report for further follow through

## 2023-01-19 ENCOUNTER — Other Ambulatory Visit (INDEPENDENT_AMBULATORY_CARE_PROVIDER_SITE_OTHER): Payer: Self-pay | Admitting: Family Medicine

## 2023-01-19 DIAGNOSIS — E1165 Type 2 diabetes mellitus with hyperglycemia: Secondary | ICD-10-CM

## 2023-01-20 ENCOUNTER — Encounter (INDEPENDENT_AMBULATORY_CARE_PROVIDER_SITE_OTHER): Payer: Self-pay | Admitting: Family Medicine

## 2023-01-20 ENCOUNTER — Ambulatory Visit (INDEPENDENT_AMBULATORY_CARE_PROVIDER_SITE_OTHER): Payer: Medicaid Other | Admitting: Family Medicine

## 2023-01-20 VITALS — BP 104/62 | HR 91 | Temp 97.9°F | Ht 64.0 in | Wt 331.0 lb

## 2023-01-20 DIAGNOSIS — Z7985 Long-term (current) use of injectable non-insulin antidiabetic drugs: Secondary | ICD-10-CM | POA: Diagnosis not present

## 2023-01-20 DIAGNOSIS — E1159 Type 2 diabetes mellitus with other circulatory complications: Secondary | ICD-10-CM

## 2023-01-20 DIAGNOSIS — I152 Hypertension secondary to endocrine disorders: Secondary | ICD-10-CM | POA: Diagnosis not present

## 2023-01-20 DIAGNOSIS — E669 Obesity, unspecified: Secondary | ICD-10-CM | POA: Diagnosis not present

## 2023-01-20 DIAGNOSIS — Z7984 Long term (current) use of oral hypoglycemic drugs: Secondary | ICD-10-CM

## 2023-01-20 DIAGNOSIS — Z6841 Body Mass Index (BMI) 40.0 and over, adult: Secondary | ICD-10-CM

## 2023-01-20 DIAGNOSIS — E559 Vitamin D deficiency, unspecified: Secondary | ICD-10-CM | POA: Diagnosis not present

## 2023-01-20 DIAGNOSIS — E1165 Type 2 diabetes mellitus with hyperglycemia: Secondary | ICD-10-CM

## 2023-01-20 MED ORDER — CHOLECALCIFEROL 1.25 MG (50000 UT) PO CAPS: 50000.0000 [IU] | ORAL_CAPSULE | ORAL | 0 refills | Status: DC

## 2023-01-20 MED ORDER — METFORMIN HCL 1000 MG PO TABS: 1000.0000 mg | ORAL_TABLET | Freq: Two times a day (BID) | ORAL | Status: DC

## 2023-01-20 MED ORDER — OZEMPIC (1 MG/DOSE) 2 MG/1.5ML ~~LOC~~ SOPN: 1.0000 mg | PEN_INJECTOR | SUBCUTANEOUS | 0 refills | Status: DC

## 2023-01-20 NOTE — Progress Notes (Signed)
   SUBJECTIVE:  Chief Complaint: Obesity  Interim History: Since last appointment patient has been trying to stay focused on her adherence to Cat 3.  For Thanksgiving she went to her daughters house and she cooked the [place] and dressing and everything else was cooked by her daughter. For the month of December she is planning to stay home and spend time with her kids.  Her sister recently had an aneurysm and is recovering.  She thinks she may be eating too much pork.  Ruth [name] is here to discuss her progress with her obesity treatment plan. She is on the Category 3 Plan and states she is following her eating plan approximately 50 % of the time. She states she is not exercising.   OBJECTIVE: Visit Diagnoses: Problem List Items Addressed This Visit       Cardiovascular and Mediastinum   Hypertension associated with diabetes (HCC) - Primary   Blood pressure very well controlled today- lower range of normal.  She denies chest pain, chest pressure, headache, dizziness and lightheadedness.  She is on atenolol, chlorthalidone and olmesartan for blood pressure control.      Relevant Medications   Semaglutide, 1 MG/DOSE, (OZEMPIC, 1 MG/DOSE,) 2 MG/1.5ML SOPN   metFORMIN (GLUCOPHAGE) 1000 MG tablet     Endocrine   DM (diabetes mellitus) (HCC)   Patient on metformin and Ozempic.  She is focusing on control of carb intake and ensuring protein intake.  Needs refills of her meds today. Labs with PCP at next appointment with PCP.      Relevant Medications   Semaglutide, 1 MG/DOSE, (OZEMPIC, 1 MG/DOSE,) 2 MG/1.5ML SOPN   metFORMIN (GLUCOPHAGE) 1000 MG tablet     Other   BMI 50.0-59.9, adult (HCC)   Relevant Medications   Semaglutide, 1 MG/DOSE, (OZEMPIC, 1 MG/DOSE,) 2 MG/1.5ML SOPN   metFORMIN (GLUCOPHAGE) 1000 MG tablet   Vitamin D deficiency   Discussed importance of vitamin d supplementation.  Vitamin d supplementation has been shown to decrease fatigue, decrease risk of progression to  insulin resistance and then prediabetes, decreases risk of falling in older age and can even assist in decreasing depressive symptoms in PTSD.   Prescription for Vitamin D sent in.        Obesity with starting BMI of 57.5   Relevant Medications   Semaglutide, 1 MG/DOSE, (OZEMPIC, 1 MG/DOSE,) 2 MG/1.5ML SOPN   metFORMIN (GLUCOPHAGE) 1000 MG tablet    No data recorded  No data recorded  No data recorded  No data recorded    ASSESSMENT AND PLAN:  Diet: Ruth [name] is currently in the action stage of change. As such, her goal is to continue with weight loss efforts. She has agreed to Category 3 Plan.  Exercise: Ruth [name] has been instructed that some exercise is better than none for weight loss and overall health benefits.   Behavior Modification:  We discussed the following Behavioral Modification Strategies today: increasing lean protein intake, increasing vegetables, meal planning and cooking strategies, and better snacking choices.  No follow-ups on file.Marland Kitchen She was informed of the importance of frequent follow up visits to maximize her success with intensive lifestyle modifications for her multiple health conditions.  Attestation Statements:   Reviewed by clinician on day of visit: allergies, medications, problem list, medical history, surgical history, family history, social history, and previous encounter notes.    Reuben Likes, MD

## 2023-01-20 NOTE — Assessment & Plan Note (Signed)
Patient doing well on metformin and ozempic.  She feels she could try to get up to the 1000mg  of Metformin again.  Her appetite is increased recently and she is hopeful the metformin will help with this. Dose change is recorded but she still has her former prescription so does not need 1000mg  sent in.  Refill of Ozempic sent in.

## 2023-01-20 NOTE — Assessment & Plan Note (Signed)
Blood pressure very well controlled today- lower range of normal.  She denies chest pain, chest pressure, headache, dizziness and lightheadedness.  She is on atenolol, chlorthalidone and olmesartan for blood pressure control.

## 2023-01-20 NOTE — Assessment & Plan Note (Signed)
 Discussed importance of vitamin d supplementation.  Vitamin d supplementation has been shown to decrease fatigue, decrease risk of progression to insulin resistance and then prediabetes, decreases risk of falling in older age and can even assist in decreasing depressive symptoms in PTSD.   Prescription for Vitamin D sent in.

## 2023-01-21 ENCOUNTER — Other Ambulatory Visit (INDEPENDENT_AMBULATORY_CARE_PROVIDER_SITE_OTHER): Payer: Self-pay | Admitting: Family Medicine

## 2023-01-21 DIAGNOSIS — E1165 Type 2 diabetes mellitus with hyperglycemia: Secondary | ICD-10-CM

## 2023-01-22 ENCOUNTER — Telehealth (INDEPENDENT_AMBULATORY_CARE_PROVIDER_SITE_OTHER): Payer: Self-pay | Admitting: Family Medicine

## 2023-01-22 NOTE — Telephone Encounter (Signed)
Rx sent during visit, spoke w/ pt-CS

## 2023-01-22 NOTE — Telephone Encounter (Signed)
Pt called stating that her Ozempic was denied. The pharmacy states that it was denied by Dr. Marquis Lunch. Patient would like a call back.

## 2023-01-23 ENCOUNTER — Encounter: Payer: Self-pay | Admitting: Adult Health

## 2023-01-23 ENCOUNTER — Ambulatory Visit: Payer: Medicaid Other | Admitting: Adult Health

## 2023-01-23 ENCOUNTER — Telehealth (INDEPENDENT_AMBULATORY_CARE_PROVIDER_SITE_OTHER): Payer: Self-pay | Admitting: Family Medicine

## 2023-01-23 ENCOUNTER — Telehealth (INDEPENDENT_AMBULATORY_CARE_PROVIDER_SITE_OTHER): Payer: Self-pay

## 2023-01-23 DIAGNOSIS — E559 Vitamin D deficiency, unspecified: Secondary | ICD-10-CM

## 2023-01-23 DIAGNOSIS — E1169 Type 2 diabetes mellitus with other specified complication: Secondary | ICD-10-CM

## 2023-01-23 DIAGNOSIS — Z23 Encounter for immunization: Secondary | ICD-10-CM | POA: Diagnosis not present

## 2023-01-23 DIAGNOSIS — I10 Essential (primary) hypertension: Secondary | ICD-10-CM

## 2023-01-23 DIAGNOSIS — Z124 Encounter for screening for malignant neoplasm of cervix: Secondary | ICD-10-CM

## 2023-01-23 DIAGNOSIS — E7849 Other hyperlipidemia: Secondary | ICD-10-CM

## 2023-01-23 MED ORDER — CHOLECALCIFEROL 1.25 MG (50000 UT) PO CAPS: 50000.0000 [IU] | ORAL_CAPSULE | ORAL | 3 refills | Status: DC

## 2023-01-23 NOTE — Telephone Encounter (Signed)
12/12 Pt has questions about her Metformin dosage and how often she should take the medication. Please call the patient to discuss.

## 2023-01-23 NOTE — Telephone Encounter (Signed)
L/mess om to return call 4:05-12/12/24-CS

## 2023-01-23 NOTE — Progress Notes (Signed)
[facility] clinic  Provider:  Kenard Gower DNP  Code Status:  Full Code  Goals of Care:     11/29/2022    3:17 PM  Advanced Directives  Does Patient Have a Medical Advance Directive? No     Chief Complaint  Patient presents with   Medical Management of Chronic Issues    3 months follow-up   Immunizations    Influenza, Covid and Shingrix   Health Maintenance    Cervical Cancer Screening    HPI: Patient is a 62 y.o. female seen today for medical management of chronic issues.  Type 2 diabetes mellitus with morbid obesity (HCC) - blood sugar yesterday 162, takes Metformin and Ozempic  Essential hypertension - BP 128/77, takes Olmesartan, chlorthalidone and atenolol  Other hyperlipidemia -  takes Atorvastatin  Vitamin D deficiency  - takes Vitamin D  Morbidly obese (HCC) - recently started Ozempic, will start walking 3X/week  Wt Readings from Last 3 Encounters:  01/23/23 (!) 336 lb 6.4 oz (152.6 kg)  01/20/23 (!) 331 lb (150.1 kg)  12/19/22 (!) 335 lb (152 kg)     Past Medical History:  Diagnosis Date   Anemia    Arthritis    Back pain    Bilateral calcaneal spurs    Bilateral lower extremity edema    Chest pain    Diabetes (HCC)    Diabetes mellitus without complication (HCC)    type 2   Gallbladder problem    GERD (gastroesophageal reflux disease)    Gout    H/O blood clots    Heartburn    Hyperlipidemia    Hypertension    Obesity    Osteoarthritis    Palpitations    SOBOE (shortness of breath on exertion)    Swallowing difficulty    Type 2 diabetes mellitus (HCC)    Vitamin D deficiency     Past Surgical History:  Procedure Laterality Date   ABDOMINAL HYSTERECTOMY     partial   CHOLECYSTECTOMY     COLONOSCOPY WITH PROPOFOL N/A 10/29/2016   Procedure: COLONOSCOPY WITH PROPOFOL;  Surgeon: Benancio Deeds, MD;  Location: [facility];  Service: Gastroenterology;  Laterality: N/A;   COLONOSCOPY WITH PROPOFOL N/A 10/01/2022   Procedure:  COLONOSCOPY WITH PROPOFOL;  Surgeon: Benancio Deeds, MD;  Location: [facility];  Service: Gastroenterology;  Laterality: N/A;  BMI = 57   DENTAL SURGERY     ORIF TIBIA & FIBULA FRACTURES Left    POLYPECTOMY  10/01/2022   Procedure: POLYPECTOMY;  Surgeon: Benancio Deeds, MD;  Location: [facility];  Service: Gastroenterology;;    Allergies  Allergen Reactions   Lisinopril Swelling    Throat swelling   Hydrochlorothiazide     Other Reaction(s): sleepy   Tape Other (See Comments)    Certain Band aids cause skin irritation    Outpatient Encounter Medications as of 01/23/2023  Medication Sig   atenolol (TENORMIN) 50 MG tablet Take 1 tablet (50 mg total) by mouth daily.   atorvastatin (LIPITOR) 40 MG tablet TAKE 1 TABLET BY MOUTH 3 TIMES A WEEK   Blood Glucose Monitoring Suppl (ACCU-CHEK GUIDE) w/Device KIT 1 Device by Does not apply route daily. CBG daily, pls give strips and lancets   chlorthalidone (HYGROTON) 25 MG tablet Take 0.5 tablets (12.5 mg total) by mouth every morning.   Cholecalciferol 1.25 MG (50000 UT) capsule Take 1 capsule (50,000 Units total) by mouth once a week.   glucose blood (ACCU-CHEK GUIDE) test strip Use  as instructed   olmesartan (BENICAR) 40 MG tablet Take 1 tablet (40 mg total) by mouth daily.   Semaglutide, 1 MG/DOSE, (OZEMPIC, 1 MG/DOSE,) 2 MG/1.5ML SOPN Inject 1 mg into the skin once a week.   metFORMIN (GLUCOPHAGE) 1000 MG tablet Take 1 tablet (1,000 mg total) by mouth 2 (two) times daily with a meal.   No facility-administered encounter medications on file as of 01/23/2023.    Review of Systems:  Review of Systems  Constitutional:  Negative for appetite change, chills, fatigue and fever.  HENT:  Negative for congestion, hearing loss, rhinorrhea and sore throat.   Eyes: Negative.   Respiratory:  Negative for cough, shortness of breath and wheezing.   Cardiovascular:  Negative for chest pain, palpitations and leg swelling.   Gastrointestinal:  Negative for abdominal pain, constipation, diarrhea, nausea and vomiting.  Genitourinary:  Negative for dysuria.  Musculoskeletal:  Negative for arthralgias, back pain and myalgias.  Skin:  Negative for color change, rash and wound.  Neurological:  Negative for dizziness, weakness and headaches.  Psychiatric/Behavioral:  Negative for behavioral problems. The patient is not nervous/anxious.     Health Maintenance  Topic Date Due   Zoster Vaccines- Shingrix (1 of 2) Never done   Cervical Cancer Screening (HPV/Pap Cotest)  08/23/2022   INFLUENZA VACCINE  09/12/2022   COVID-19 Vaccine (4 - 2024-25 season) 10/13/2022   Lung Cancer Screening  10/24/2023 (Originally 10/15/2010)   HEMOGLOBIN A1C  04/23/2023   Diabetic kidney evaluation - Urine ACR  07/11/2023   FOOT EXAM  07/11/2023   OPHTHALMOLOGY EXAM  07/17/2023   Diabetic kidney evaluation - eGFR measurement  07/30/2023   MAMMOGRAM  08/04/2024   DTaP/Tdap/Td (2 - Td or Tdap) 10/25/2028   Colonoscopy  09/30/2032   Hepatitis C Screening  Completed   HIV Screening  Completed   HPV VACCINES  Aged Out    Physical Exam: Vitals:   01/23/23 1508  BP: 128/77  Pulse: 87  Resp: 18  Temp: 98 F (36.7 C)  SpO2: 95%  Weight: (!) 336 lb 6.4 oz (152.6 kg)  Height: 5\' 4"  (1.626 m)   Body mass index is 57.74 kg/m. Physical Exam Constitutional:      General: She is not in acute distress.    Appearance: She is obese.     Comments: Morbidly obese.  HENT:     Head: Normocephalic and atraumatic.     Nose: Nose normal.     Mouth/Throat:     Mouth: Mucous membranes are moist.  Eyes:     Conjunctiva/sclera: Conjunctivae normal.  Cardiovascular:     Rate and Rhythm: Normal rate and regular rhythm.  Pulmonary:     Effort: Pulmonary effort is normal.     Breath sounds: Normal breath sounds.  Abdominal:     General: Bowel sounds are normal.     Palpations: Abdomen is soft.  Musculoskeletal:        General: Normal range  of motion.     Cervical back: Normal range of motion.  Skin:    General: Skin is warm and dry.  Neurological:     General: No focal deficit present.     Mental Status: She is alert and oriented to person, place, and time.  Psychiatric:        Mood and Affect: Mood normal.        Behavior: Behavior normal.        Thought Content: Thought content normal.  Judgment: Judgment normal.     Labs reviewed: Basic Metabolic Panel: Recent Labs    05/08/22 0913 07/30/22 0843  NA 138 138  K 4.0 5.0  CL 103 101  CO2 28 24  GLUCOSE 303* 363*  BUN 20 20  CREATININE 0.71 0.93  CALCIUM 9.8 10.0  TSH  --  2.140   Liver Function Tests: Recent Labs    05/08/22 0913 07/30/22 0843  AST 20 15  ALT 24 18  ALKPHOS  --  101  BILITOT 0.6 0.4  PROT 7.1 6.8  ALBUMIN  --  4.1   No results for input(s): "LIPASE", "AMYLASE" in the last 8760 hours. No results for input(s): "AMMONIA" in the last 8760 hours. CBC: Recent Labs    07/30/22 0843  WBC 5.7  NEUTROABS 3.4  HGB 12.9  HCT 40.2  MCV 94  PLT 204   Lipid Panel: Recent Labs    05/08/22 0913  CHOL 185  HDL 52  LDLCALC 114*  TRIG 90  CHOLHDL 3.6   Lab Results  Component Value Date   HGBA1C 11.7 (H) 10/24/2022    Procedures since last visit: No results found.  Assessment/Plan  1. Type 2 diabetes mellitus with morbid obesity (HCC) (Primary) -  A1C 11.7, 10/24/22 -  continue Metformin and Ozempic - Hemoglobin A1C; Future  2. Essential hypertension -  BP stable -  continue current regimen - Complete Metabolic Panel with eGFR; Future - CBC with Differential/Platelets; Future  3. Other hyperlipidemia -  continue Atorvastatin - Lipid panel; Future  4. Vitamin D deficiency Last vitamin D Lab Results  Component Value Date   VD25OH 31 10/24/2022    - Cholecalciferol 1.25 MG (50000 UT) capsule; Take 1 capsule (50,000 Units total) by mouth once a week.  Dispense: 4 capsule; Refill: 3  5. Morbidly obese  (HCC) Wt Readings from Last 3 Encounters:  01/23/23 (!) 336 lb 6.4 oz (152.6 kg)  01/20/23 (!) 331 lb (150.1 kg)  12/19/22 (!) 335 lb (152 kg)    -  counseled on diet and exercise -  continue Ozempic -   follows up with [facility]  6. Flu vaccine need - Flu vaccine trivalent PF, 6mos and older(Flulaval,Afluria,Fluarix,Fluzone)  7. Screening for cervical cancer - Ambulatory referral to Gynecology    Labs/tests ordered: A1C, lipid panel, CMP, CBC  Next appt:  Visit date not found

## 2023-01-23 NOTE — Telephone Encounter (Signed)
Pt had a question about resuming metformin-1000mg  twice a day, pt voice understanding-CS

## 2023-01-24 ENCOUNTER — Other Ambulatory Visit: Payer: Medicaid Other

## 2023-01-24 DIAGNOSIS — E1169 Type 2 diabetes mellitus with other specified complication: Secondary | ICD-10-CM

## 2023-01-24 DIAGNOSIS — E7849 Other hyperlipidemia: Secondary | ICD-10-CM

## 2023-01-24 DIAGNOSIS — I10 Essential (primary) hypertension: Secondary | ICD-10-CM

## 2023-01-25 LAB — CBC WITH DIFFERENTIAL/PLATELET
Absolute Lymphocytes: 1883 {cells}/uL (ref 850–3900)
Absolute Monocytes: 441 {cells}/uL (ref 200–950)
Basophils Absolute: 49 {cells}/uL (ref 0–200)
Basophils Relative: 0.7 %
Eosinophils Absolute: 189 {cells}/uL (ref 15–500)
Eosinophils Relative: 2.7 %
HCT: 38.7 % (ref 35.0–45.0)
Hemoglobin: 12.2 g/dL (ref 11.7–15.5)
MCH: 29.8 pg (ref 27.0–33.0)
MCHC: 31.5 g/dL — ABNORMAL LOW (ref 32.0–36.0)
MCV: 94.4 fL (ref 80.0–100.0)
MPV: 11.8 fL (ref 7.5–12.5)
Monocytes Relative: 6.3 %
Neutro Abs: 4438 {cells}/uL (ref 1500–7800)
Neutrophils Relative %: 63.4 %
Platelets: 250 10*3/uL (ref 140–400)
RBC: 4.1 10*6/uL (ref 3.80–5.10)
RDW: 12.6 % (ref 11.0–15.0)
Total Lymphocyte: 26.9 %
WBC: 7 10*3/uL (ref 3.8–10.8)

## 2023-01-25 LAB — COMPLETE METABOLIC PANEL WITH GFR
AG Ratio: 1.4 (calc) (ref 1.0–2.5)
ALT: 19 U/L (ref 6–29)
AST: 15 U/L (ref 10–35)
Albumin: 3.9 g/dL (ref 3.6–5.1)
Alkaline phosphatase (APISO): 80 U/L (ref 37–153)
BUN: 16 mg/dL (ref 7–25)
CO2: 30 mmol/L (ref 20–32)
Calcium: 9.9 mg/dL (ref 8.6–10.4)
Chloride: 103 mmol/L (ref 98–110)
Creat: 0.8 mg/dL (ref 0.50–1.05)
Globulin: 2.7 g/dL (ref 1.9–3.7)
Glucose, Bld: 227 mg/dL — ABNORMAL HIGH (ref 65–99)
Potassium: 4 mmol/L (ref 3.5–5.3)
Sodium: 140 mmol/L (ref 135–146)
Total Bilirubin: 0.3 mg/dL (ref 0.2–1.2)
Total Protein: 6.6 g/dL (ref 6.1–8.1)
eGFR: 83 mL/min/{1.73_m2} (ref >=60)

## 2023-01-25 LAB — LIPID PANEL
Cholesterol: 145 mg/dL (ref <200)
HDL: 47 mg/dL — ABNORMAL LOW (ref >=50)
LDL Cholesterol (Calc): 82 mg/dL
Non-HDL Cholesterol (Calc): 98 mg/dL (ref <130)
Total CHOL/HDL Ratio: 3.1 (calc) (ref <5.0)
Triglycerides: 80 mg/dL (ref <150)

## 2023-01-25 LAB — HEMOGLOBIN A1C
Hgb A1c MFr Bld: 9.8 %{Hb} — ABNORMAL HIGH (ref <5.7)
Mean Plasma Glucose: 235 mg/dL
eAG (mmol/L): 13 mmol/L

## 2023-01-28 NOTE — Progress Notes (Signed)
-      No anemia -     Electrolytes, lipid panel and liver enzymes normal -     A1c 9.8, down from 11.7, no new recommendations, keep it up!

## 2023-02-02 NOTE — Assessment & Plan Note (Signed)
Patient on metformin and Ozempic.  She is focusing on control of carb intake and ensuring protein intake.  Needs refills of her meds today. Labs with PCP at next appointment with PCP.

## 2023-02-03 NOTE — Assessment & Plan Note (Signed)
Last A1c of over 11 and consistently elevated blood sugars.  We discussed importance of carb intake control and ensuring protein intake to help patient control her blood sugar numbers.  She understands.  She is on ozempic and metformin.  Needs repeat labs at next appointment.

## 2023-02-03 NOTE — Assessment & Plan Note (Signed)
Last Vitamin D level of 19.8 in June of this year. Patient experiencing some fatigue but no nausea, vomiting or muscle weakness.  Needs a refill of her Rx today.

## 2023-02-04 ENCOUNTER — Telehealth (INDEPENDENT_AMBULATORY_CARE_PROVIDER_SITE_OTHER): Payer: Self-pay | Admitting: Family Medicine

## 2023-02-04 NOTE — Telephone Encounter (Signed)
Pt reports she now has Medicaid with [employer] and she wants to know if someone can submit a prior authorization to see if it will be approved because when she called [employer] to make sure her Ozempic would be covered she was told she would need a prior authorization to determine that. Please follow up with the patient.

## 2023-02-06 ENCOUNTER — Encounter (INDEPENDENT_AMBULATORY_CARE_PROVIDER_SITE_OTHER): Payer: Self-pay

## 2023-02-10 ENCOUNTER — Telehealth (INDEPENDENT_AMBULATORY_CARE_PROVIDER_SITE_OTHER): Payer: Self-pay

## 2023-02-10 ENCOUNTER — Encounter (INDEPENDENT_AMBULATORY_CARE_PROVIDER_SITE_OTHER): Payer: Self-pay

## 2023-02-10 NOTE — Telephone Encounter (Signed)
Prior auth started Key: [id number])

## 2023-02-10 NOTE — Telephone Encounter (Signed)
Approved Request Reference Number: [id number]. OZEMPIC INJ 4MG /3ML is approved through 02/10/2024. For further questions, call [employer] at [phone].Marland Kitchen Authorization Expiration Date: February 10, 2024.

## 2023-02-17 ENCOUNTER — Other Ambulatory Visit (INDEPENDENT_AMBULATORY_CARE_PROVIDER_SITE_OTHER): Payer: Self-pay | Admitting: Family Medicine

## 2023-02-17 DIAGNOSIS — E1165 Type 2 diabetes mellitus with hyperglycemia: Secondary | ICD-10-CM

## 2023-02-19 ENCOUNTER — Other Ambulatory Visit (INDEPENDENT_AMBULATORY_CARE_PROVIDER_SITE_OTHER): Payer: Self-pay | Admitting: Family Medicine

## 2023-02-19 ENCOUNTER — Telehealth (INDEPENDENT_AMBULATORY_CARE_PROVIDER_SITE_OTHER): Payer: Self-pay | Admitting: Family Medicine

## 2023-02-19 DIAGNOSIS — E1165 Type 2 diabetes mellitus with hyperglycemia: Secondary | ICD-10-CM

## 2023-02-19 NOTE — Telephone Encounter (Signed)
 Spoke w/ pt, will refill at next visit-CS

## 2023-02-19 NOTE — Telephone Encounter (Signed)
 Pt is calling in stating that she is needing a refill on her Rx: Ozempic  1 MG and want to make sure that it is for 1 MG unless the provider has changed her dosage.  Pharm: [facility] on [address].  Pt would like to have a call if the provider has changed her dosage from 1 MG.

## 2023-02-27 ENCOUNTER — Ambulatory Visit (INDEPENDENT_AMBULATORY_CARE_PROVIDER_SITE_OTHER): Payer: Medicaid Other | Admitting: Family Medicine

## 2023-02-27 VITALS — BP 129/77 | HR 85 | Temp 98.0°F | Ht 64.0 in | Wt 331.0 lb

## 2023-02-27 DIAGNOSIS — I152 Hypertension secondary to endocrine disorders: Secondary | ICD-10-CM

## 2023-02-27 DIAGNOSIS — E669 Obesity, unspecified: Secondary | ICD-10-CM | POA: Diagnosis not present

## 2023-02-27 DIAGNOSIS — E1165 Type 2 diabetes mellitus with hyperglycemia: Secondary | ICD-10-CM

## 2023-02-27 DIAGNOSIS — E1159 Type 2 diabetes mellitus with other circulatory complications: Secondary | ICD-10-CM | POA: Diagnosis not present

## 2023-02-27 DIAGNOSIS — Z6841 Body Mass Index (BMI) 40.0 and over, adult: Secondary | ICD-10-CM

## 2023-02-27 DIAGNOSIS — Z7985 Long-term (current) use of injectable non-insulin antidiabetic drugs: Secondary | ICD-10-CM

## 2023-02-27 MED ORDER — CHLORTHALIDONE 25 MG PO TABS: 12.5000 mg | ORAL_TABLET | Freq: Every morning | ORAL | 2 refills | Status: DC

## 2023-02-27 MED ORDER — ATENOLOL 50 MG PO TABS: 50.0000 mg | ORAL_TABLET | Freq: Every day | ORAL | 1 refills | Status: DC

## 2023-02-27 MED ORDER — OZEMPIC (1 MG/DOSE) 2 MG/1.5ML ~~LOC~~ SOPN: 1.0000 mg | PEN_INJECTOR | SUBCUTANEOUS | 0 refills | Status: DC

## 2023-02-27 NOTE — Assessment & Plan Note (Signed)
Patient on metformin and ozempic daily. No GI side effects mentioned. Needs refill of Ozempic today. Noticing she still has a significant appetite even on Ozempic. Refill sent in. Last A1c of 9.8 with PCP last month. Will repeat labs in 3-4 months.

## 2023-02-27 NOTE — Assessment & Plan Note (Signed)
Patient doing well on atenolol and benicar.  Blood pressure well controlled today.  No chest pain, chest pressure or headache.  Needs refills of atenolol and chlorthalidone.  Refills sent in today. No chest pain, chest pressure or headache.

## 2023-02-27 NOTE — Progress Notes (Signed)
   SUBJECTIVE:  Chief Complaint: Obesity  Interim History: Patient had a good holiday season- stayed local.  Over the last month patient has done reasonably well staying consistent on her meal plan.  She has been fairly well on getting most of the nutrition in daily.  She has been trying to change her eating habits to more consistently align with Category 3 plan.    Ruth [name] is here to discuss her progress with her obesity treatment plan. She is on the Category 3 Plan and states she is following her eating plan approximately 60 % of the time. She states she is walking some.   OBJECTIVE: Visit Diagnoses: Problem List Items Addressed This Visit       Cardiovascular and Mediastinum   Hypertension associated with diabetes (HCC) - Primary   Patient doing well on atenolol and benicar.  Blood pressure well controlled today.  No chest pain, chest pressure or headache.  Needs refills of atenolol and chlorthalidone.  Refills sent in today. No chest pain, chest pressure or headache.      Relevant Medications   atenolol (TENORMIN) 50 MG tablet   chlorthalidone (HYGROTON) 25 MG tablet   Semaglutide, 1 MG/DOSE, (OZEMPIC, 1 MG/DOSE,) 2 MG/1.5ML SOPN     Endocrine   DM (diabetes mellitus) (HCC)   Patient on metformin and ozempic daily. No GI side effects mentioned. Needs refill of Ozempic today. Noticing she still has a significant appetite even on Ozempic. Refill sent in. Last A1c of 9.8 with PCP last month. Will repeat labs in 3-4 months.       Relevant Medications   Semaglutide, 1 MG/DOSE, (OZEMPIC, 1 MG/DOSE,) 2 MG/1.5ML SOPN     Other   BMI 50.0-59.9, adult (HCC)   Relevant Medications   Semaglutide, 1 MG/DOSE, (OZEMPIC, 1 MG/DOSE,) 2 MG/1.5ML SOPN   Obesity with starting BMI of 57.5   Relevant Medications   Semaglutide, 1 MG/DOSE, (OZEMPIC, 1 MG/DOSE,) 2 MG/1.5ML SOPN    Vitals Temp: 98 F (36.7 C) BP: 129/77 Pulse Rate: 85 SpO2: 100 %   Anthropometric Measurements Height: 5'  4" (1.626 m) Weight: (!) 331 lb (150.1 kg) BMI (Calculated): 56.79 Weight at Last Visit: 331 lb Weight Lost Since Last Visit: 0 Weight Gained Since Last Visit: 0 Starting Weight: 335 lb Total Weight Loss (lbs): 4 lb (1.814 kg)   Body Composition  Body Fat %: 57.2 % Fat Mass (lbs): 189.6 lbs Muscle Mass (lbs): 134.6 lbs Total Body Water (lbs): 101.4 lbs Visceral Fat Rating : 25   Other Clinical Data Today's Visit #: 8 Starting Date: 07/30/22     ASSESSMENT AND PLAN:  Diet: Landra is currently in the action stage of change. As such, her goal is to continue with weight loss efforts. She has agreed to Category 3 Plan.  Exercise: Darryl has been instructed that some exercise is better than none for weight loss and overall health benefits.   Behavior Modification:  We discussed the following Behavioral Modification Strategies today: increasing lean protein intake, decreasing simple carbohydrates, increasing vegetables, and meal planning and cooking strategies.  Return in about 3 weeks (around 03/20/2023).Marland Kitchen She was informed of the importance of frequent follow up visits to maximize her success with intensive lifestyle modifications for her multiple health conditions.  Attestation Statements:   Reviewed by clinician on day of visit: allergies, medications, problem list, medical history, surgical history, family history, social history, and previous encounter notes.      Reuben Likes, MD

## 2023-03-21 ENCOUNTER — Encounter: Payer: Medicaid Other | Admitting: Radiology

## 2023-03-25 ENCOUNTER — Ambulatory Visit (INDEPENDENT_AMBULATORY_CARE_PROVIDER_SITE_OTHER): Payer: Medicaid Other | Admitting: Family Medicine

## 2023-03-25 ENCOUNTER — Other Ambulatory Visit (INDEPENDENT_AMBULATORY_CARE_PROVIDER_SITE_OTHER): Payer: Self-pay | Admitting: Family Medicine

## 2023-03-25 DIAGNOSIS — E1165 Type 2 diabetes mellitus with hyperglycemia: Secondary | ICD-10-CM

## 2023-03-31 ENCOUNTER — Ambulatory Visit (INDEPENDENT_AMBULATORY_CARE_PROVIDER_SITE_OTHER): Payer: Medicaid Other | Admitting: Family Medicine

## 2023-03-31 ENCOUNTER — Encounter (INDEPENDENT_AMBULATORY_CARE_PROVIDER_SITE_OTHER): Payer: Self-pay | Admitting: Family Medicine

## 2023-03-31 VITALS — BP 119/76 | HR 84 | Temp 98.1°F | Ht 64.0 in | Wt 327.0 lb

## 2023-03-31 DIAGNOSIS — E669 Obesity, unspecified: Secondary | ICD-10-CM | POA: Diagnosis not present

## 2023-03-31 DIAGNOSIS — E1159 Type 2 diabetes mellitus with other circulatory complications: Secondary | ICD-10-CM

## 2023-03-31 DIAGNOSIS — Z6841 Body Mass Index (BMI) 40.0 and over, adult: Secondary | ICD-10-CM | POA: Diagnosis not present

## 2023-03-31 DIAGNOSIS — I152 Hypertension secondary to endocrine disorders: Secondary | ICD-10-CM | POA: Diagnosis not present

## 2023-03-31 DIAGNOSIS — Z7984 Long term (current) use of oral hypoglycemic drugs: Secondary | ICD-10-CM

## 2023-03-31 DIAGNOSIS — Z7985 Long-term (current) use of injectable non-insulin antidiabetic drugs: Secondary | ICD-10-CM

## 2023-03-31 DIAGNOSIS — E1165 Type 2 diabetes mellitus with hyperglycemia: Secondary | ICD-10-CM

## 2023-03-31 MED ORDER — OZEMPIC (1 MG/DOSE) 2 MG/1.5ML ~~LOC~~ SOPN: 1.0000 mg | PEN_INJECTOR | SUBCUTANEOUS | 0 refills | Status: DC

## 2023-03-31 MED ORDER — METFORMIN HCL 750 MG PO TABS: 750.0000 mg | ORAL_TABLET | Freq: Two times a day (BID) | ORAL | 0 refills | Status: DC

## 2023-03-31 NOTE — Assessment & Plan Note (Addendum)
 Patient is doing well on Ozempic and mentions she is struggling with the second dose of Metformin.  We discussed various tactics to help with taking second dose- She will place metformin bedside to help with reminding her to take it.

## 2023-03-31 NOTE — Progress Notes (Signed)
 SUBJECTIVE:  Chief Complaint: Obesity  Interim History: Patient has been dealing with the recent death of her sister and the emotions around that. Emotionally she feels she is doing alright- she is leaning heavily on her faith. She does feel like this has impacted her motivation and ability get to the gym she signed up for.  She feels she has done some emotional eating.  Doesn't have any plans moving forward.  She is also looking for a part time job.  Feels like she is doing better with her meal plan.  She has added in more vegetables.   Ruth [name] is here to discuss her progress with her obesity treatment plan. She is on the Category 3 Plan and states she is following her eating plan approximately 60 % of the time. She states she is not exercising yet.   OBJECTIVE: Visit Diagnoses: Problem List Items Addressed This Visit       Cardiovascular and Mediastinum   Hypertension associated with diabetes (HCC)   Blood pressure well controlled today- and has been for last few appointments.  No chest pain, chest pressure or headache.  Continue current treatment- no change in dose.      Relevant Medications   Semaglutide, 1 MG/DOSE, (OZEMPIC, 1 MG/DOSE,) 2 MG/1.5ML SOPN   metFORMIN 750 MG TABS     Endocrine   DM (diabetes mellitus) (HCC) - Primary   Patient has done well on ozempic and metformin combination.  She is not experiencing any side effects mentioned.  She needs refills of the medications today- will refill and continue at same dose.      Relevant Medications   Semaglutide, 1 MG/DOSE, (OZEMPIC, 1 MG/DOSE,) 2 MG/1.5ML SOPN   metFORMIN 750 MG TABS     Other   BMI 50.0-59.9, adult (HCC)   Relevant Medications   Semaglutide, 1 MG/DOSE, (OZEMPIC, 1 MG/DOSE,) 2 MG/1.5ML SOPN   metFORMIN 750 MG TABS   Obesity with starting BMI of 57.5   Anthropometric Measurements Height: 5\' 4"  (1.626 m) Weight: (!) 327 lb (148.3 kg) BMI (Calculated): 56.1 Weight at Last Visit: 331 lb Weight Lost  Since Last Visit: 4 Weight Gained Since Last Visit: 0 Starting Weight: 335 lb Total Weight Loss (lbs): 8 lb (3.629 kg)  Body Composition  Body Fat %: 55.4 % Fat Mass (lbs): 181.4 lbs Muscle Mass (lbs): 138.8 lbs Total Body Water (lbs): 97.6 lbs Visceral Fat Rating : 24  Patient is doing well on combination of ozempic and metformin and increasing total amount of food intake while monitoring macronutrient composition.        Relevant Medications   Semaglutide, 1 MG/DOSE, (OZEMPIC, 1 MG/DOSE,) 2 MG/1.5ML SOPN   metFORMIN 750 MG TABS    No data recorded    No data recorded    ASSESSMENT AND PLAN:  Diet: Ruth [name] is currently in the action stage of change. As such, her goal is to continue with weight loss efforts. She has agreed to Category 3 Plan.  Wants to be consistent with her intake of nutrition and start using her food scale more.  Exercise: Ruth [name] has been instructed that some exercise is better than none for weight loss and overall health benefits.   Behavior Modification:  We discussed the following Behavioral Modification Strategies today: increasing lean protein intake, increasing vegetables, meal planning and cooking strategies, and emotional eating strategies .   No follow-ups on file.Marland Kitchen She was informed of the importance of frequent follow up visits to maximize her success with intensive  lifestyle modifications for her multiple health conditions.  Attestation Statements:   Reviewed by clinician on day of visit: allergies, medications, problem list, medical history, surgical history, family history, social history, and previous encounter notes.   Time spent on visit including pre-visit chart review and post-visit care and charting was 30 minutes.    Reuben Likes, MD

## 2023-04-13 NOTE — Assessment & Plan Note (Signed)
 Patient has done well on ozempic and metformin combination.  She is not experiencing any side effects mentioned.  She needs refills of the medications today- will refill and continue at same dose.

## 2023-04-13 NOTE — Assessment & Plan Note (Signed)
 Anthropometric Measurements Height: 5\' 4"  (1.626 m) Weight: (!) 327 lb (148.3 kg) BMI (Calculated): 56.1 Weight at Last Visit: 331 lb Weight Lost Since Last Visit: 4 Weight Gained Since Last Visit: 0 Starting Weight: 335 lb Total Weight Loss (lbs): 8 lb (3.629 kg)  Body Composition  Body Fat %: 55.4 % Fat Mass (lbs): 181.4 lbs Muscle Mass (lbs): 138.8 lbs Total Body Water (lbs): 97.6 lbs Visceral Fat Rating : 24  Patient is doing well on combination of ozempic and metformin and increasing total amount of food intake while monitoring macronutrient composition.

## 2023-04-13 NOTE — Assessment & Plan Note (Signed)
 Blood pressure well controlled today- and has been for last few appointments.  No chest pain, chest pressure or headache.  Continue current treatment- no change in dose.

## 2023-04-23 ENCOUNTER — Other Ambulatory Visit (HOSPITAL_COMMUNITY)
Admission: RE | Admit: 2023-04-23 | Discharge: 2023-04-23 | Disposition: A | Discharge status: Home / Self Care | Source: Ambulatory Visit | Attending: Radiology | Admitting: Radiology

## 2023-04-23 ENCOUNTER — Encounter: Payer: Self-pay | Admitting: Radiology

## 2023-04-23 ENCOUNTER — Ambulatory Visit (INDEPENDENT_AMBULATORY_CARE_PROVIDER_SITE_OTHER): Payer: Medicaid Other | Admitting: Radiology

## 2023-04-23 VITALS — BP 132/88 | HR 87 | Ht 65.0 in | Wt 330.2 lb

## 2023-04-23 DIAGNOSIS — Z01419 Encounter for gynecological examination (general) (routine) without abnormal findings: Secondary | ICD-10-CM

## 2023-04-23 DIAGNOSIS — Z1331 Encounter for screening for depression: Secondary | ICD-10-CM

## 2023-04-23 DIAGNOSIS — Z13820 Encounter for screening for osteoporosis: Secondary | ICD-10-CM

## 2023-04-23 NOTE — Patient Instructions (Signed)
 Preventive Care 16-63 Years Old, Female  Preventive care refers to lifestyle choices and visits with your health care provider that can promote health and wellness. Preventive care visits are also called wellness exams.  What can I expect for my preventive care visit?  Counseling  Your health care provider may ask you questions about your:  Medical history, including:  Past medical problems.  Family medical history.  Pregnancy history.  Current health, including:  Menstrual cycle.  Method of birth control.  Emotional well-being.  Home life and relationship well-being.  Sexual activity and sexual health.  Lifestyle, including:  Alcohol, nicotine or tobacco, and drug use.  Access to firearms.  Diet, exercise, and sleep habits.  Work and work Astronomer.  Sunscreen use.  Safety issues such as seatbelt and bike helmet use.  Physical exam  Your health care provider will check your:  Height and weight. These may be used to calculate your BMI (body mass index). BMI is a measurement that tells if you are at a healthy weight.  Waist circumference. This measures the distance around your waistline. This measurement also tells if you are at a healthy weight and may help predict your risk of certain diseases, such as type 2 diabetes and high blood pressure.  Heart rate and blood pressure.  Body temperature.  Skin for abnormal spots.  What immunizations do I need?    Vaccines are usually given at various ages, according to a schedule. Your health care provider will recommend vaccines for you based on your age, medical history, and lifestyle or other factors, such as travel or where you work.  What tests do I need?  Screening  Your health care provider may recommend screening tests for certain conditions. This may include:  Lipid and cholesterol levels.  Diabetes screening. This is done by checking your blood sugar (glucose) after you have not eaten for a while (fasting).  Pelvic exam and Pap test.  Hepatitis B test.  Hepatitis C  test.  HIV (human immunodeficiency virus) test.  STI (sexually transmitted infection) testing, if you are at risk.  Lung cancer screening.  Colorectal cancer screening.  Mammogram. Talk with your health care provider about when you should start having regular mammograms. This may depend on whether you have a family history of breast cancer.  BRCA-related cancer screening. This may be done if you have a family history of breast, ovarian, tubal, or peritoneal cancers.  Bone density scan. This is done to screen for osteoporosis.  Talk with your health care provider about your test results, treatment options, and if necessary, the need for more tests.  Follow these instructions at home:  Eating and drinking    Eat a diet that includes fresh fruits and vegetables, whole grains, lean protein, and low-fat dairy products.  Take vitamin and mineral supplements as recommended by your health care provider.  Do not drink alcohol if:  Your health care provider tells you not to drink.  You are pregnant, may be pregnant, or are planning to become pregnant.  If you drink alcohol:  Limit how much you have to 0-1 drink a day.  Know how much alcohol is in your drink. In the [place] one drink equals one 12 oz bottle of beer (355 mL), one 5 oz glass of wine (148 mL), or one 1 oz glass of hard liquor (44 mL).  Lifestyle  Brush your teeth every morning and night with fluoride toothpaste. Floss one time each day.  Exercise for at least  30 minutes 5 or more days each week.  Do not use any products that contain nicotine or tobacco. These products include cigarettes, chewing tobacco, and vaping devices, such as e-cigarettes. If you need help quitting, ask your health care provider.  Do not use drugs.  If you are sexually active, practice safe sex. Use a condom or other form of protection to prevent STIs.  If you do not wish to become pregnant, use a form of birth control. If you plan to become pregnant, see your health care provider for a  prepregnancy visit.  Take aspirin only as told by your health care provider. Make sure that you understand how much to take and what form to take. Work with your health care provider to find out whether it is safe and beneficial for you to take aspirin daily.  Find healthy ways to manage stress, such as:  Meditation, yoga, or listening to music.  Journaling.  Talking to a trusted person.  Spending time with friends and family.  Minimize exposure to UV radiation to reduce your risk of skin cancer.  Safety  Always wear your seat belt while driving or riding in a vehicle.  Do not drive:  If you have been drinking alcohol. Do not ride with someone who has been drinking.  When you are tired or distracted.  While texting.  If you have been using any mind-altering substances or drugs.  Wear a helmet and other protective equipment during sports activities.  If you have firearms in your house, make sure you follow all gun safety procedures.  Seek help if you have been physically or sexually abused.  What's next?  Visit your health care provider once a year for an annual wellness visit.  Ask your health care provider how often you should have your eyes and teeth checked.  Stay up to date on all vaccines.  This information is not intended to replace advice given to you by your health care provider. Make sure you discuss any questions you have with your health care provider.  Document Revised: 07/26/2020 Document Reviewed: 07/26/2020  Elsevier Patient Education  2024 [employer].

## 2023-04-23 NOTE — Progress Notes (Signed)
 Ruth [name] 08-12-1960 [id number]   History:  63 y.o. G5P3 presents for annual exam as a new patient. Hyst 1982 for bleeding. No hx of abnormal paps. No gyn concerns.   Gynecologic History Hysterectomy: 1982  Sexually active: yes  Health Maintenance Last Pap: 08/2017. Results were: normal Last mammogram: 6/24. Results were:normal  Last colonoscopy: 8/24.      01/23/2023    3:04 PM 12/05/2022    2:09 PM 11/29/2022    3:17 PM  Depression screen PHQ 2/9  Decreased Interest 0 0 0  Down, Depressed, Hopeless 0 0 0  PHQ - 2 Score 0 0 0  Altered sleeping  0   Tired, decreased energy  0   Change in appetite  0   Feeling bad or failure about yourself   0   Trouble concentrating  0   Moving slowly or fidgety/restless  0   Suicidal thoughts  0   PHQ-9 Score  0      Past medical history, past surgical history, family history and social history were all reviewed and documented in the EPIC chart.  Past Medical History:  Diagnosis Date   Anemia    Arthritis    Back pain    Bilateral calcaneal spurs    Bilateral lower extremity edema    Chest pain    Diabetes (HCC)    Diabetes mellitus without complication (HCC)    type 2   Gallbladder problem    GERD (gastroesophageal reflux disease)    Gout    H/O blood clots    Heartburn    Hyperlipidemia    Hypertension    Obesity    Osteoarthritis    Palpitations    SOBOE (shortness of breath on exertion)    Swallowing difficulty    Type 2 diabetes mellitus (HCC)    Vitamin D deficiency     Current Outpatient Medications on File Prior to Visit  Medication Sig Dispense Refill   atenolol (TENORMIN) 50 MG tablet Take 1 tablet (50 mg total) by mouth daily. 90 tablet 1   atorvastatin (LIPITOR) 40 MG tablet TAKE 1 TABLET BY MOUTH 3 TIMES A WEEK 36 tablet 1   Blood Glucose Monitoring Suppl (ACCU-CHEK GUIDE) w/Device KIT 1 Device by Does not apply route daily. CBG daily, pls give strips and lancets 1 kit 0   chlorthalidone (HYGROTON)  25 MG tablet Take 0.5 tablets (12.5 mg total) by mouth every morning. 45 tablet 2   Cholecalciferol 1.25 MG (50000 UT) capsule Take 1 capsule (50,000 Units total) by mouth once a week. 4 capsule 3   glucose blood (ACCU-CHEK GUIDE) test strip Use as instructed 100 each 12   metFORMIN 750 MG TABS Take 750 mg by mouth 2 (two) times daily with a meal. 60 tablet 0   olmesartan (BENICAR) 40 MG tablet Take 1 tablet (40 mg total) by mouth daily. 90 tablet 1   Semaglutide, 1 MG/DOSE, (OZEMPIC, 1 MG/DOSE,) 2 MG/1.5ML SOPN Inject 1 mg into the skin once a week. 3 mL 0   No current facility-administered medications on file prior to visit.     ROS:  A ROS was performed and pertinent positives and negatives are included.  Exam:  Vitals:   04/23/23 1559  BP: 132/88  Pulse: 87  SpO2: 96%  Weight: (!) 330 lb 3.2 oz (149.8 kg)  Height: 5\' 5"  (1.651 m)   Body mass index is 54.95 kg/m.  General appearance:  Normal, morbidly obese Thyroid:  Symmetrical,  normal in size, without palpable masses or nodularity. Respiratory  Auscultation:  Clear without wheezing or rhonchi Cardiovascular  Auscultation:  Regular rate, without rubs, murmurs or gallops  Edema/varicosities:  Not grossly evident Abdominal  Soft,nontender, without masses, guarding or rebound.  Liver/spleen:  No organomegaly noted  Hernia:  None appreciated  Skin  Inspection:  Grossly normal Breasts: Examined lying and sitting.   Right: Without masses, retractions, nipple discharge or axillary adenopathy.   Left: Without masses, retractions, nipple discharge or axillary adenopathy. Genitourinary   Inguinal/mons:  Normal without inguinal adenopathy  External genitalia:  Normal appearing vulva with no masses, tenderness, or lesions  BUS/Urethra/Skene's glands:  Normal  Vagina:  Normal appearing with normal color and discharge, no lesions. Atrophy mild  Cervix:  absent  Uterus:  absent  Adnexa/parametria:  unable to palpate due to  habitus  Anus and perineum: Normal   Raynelle Fanning, CMA present for exam  Assessment/Plan:   1. Well woman exam with routine gynecological exam (Primary) - Cytology - PAP( [facility]) - Yearly mammogram  2. Screening for osteoporosis - DG Bone Density; Future   Return in 1 year for annual or sooner prn.  Arlie Solomons B WHNP-BC 4:11 PM 04/23/2023

## 2023-04-24 ENCOUNTER — Encounter: Payer: Medicaid Other | Admitting: Adult Health

## 2023-04-24 ENCOUNTER — Other Ambulatory Visit (INDEPENDENT_AMBULATORY_CARE_PROVIDER_SITE_OTHER): Payer: Self-pay | Admitting: Family Medicine

## 2023-04-24 DIAGNOSIS — E1165 Type 2 diabetes mellitus with hyperglycemia: Secondary | ICD-10-CM

## 2023-04-25 LAB — CYTOLOGY - PAP
Comment: NEGATIVE
Diagnosis: NEGATIVE
High risk HPV: NEGATIVE

## 2023-04-25 NOTE — Progress Notes (Signed)
 This encounter was created in error - please disregard.

## 2023-04-27 ENCOUNTER — Other Ambulatory Visit (INDEPENDENT_AMBULATORY_CARE_PROVIDER_SITE_OTHER): Payer: Self-pay | Admitting: Family Medicine

## 2023-04-27 DIAGNOSIS — E1165 Type 2 diabetes mellitus with hyperglycemia: Secondary | ICD-10-CM

## 2023-04-29 ENCOUNTER — Ambulatory Visit (INDEPENDENT_AMBULATORY_CARE_PROVIDER_SITE_OTHER): Payer: Medicaid Other | Admitting: Family Medicine

## 2023-04-29 ENCOUNTER — Encounter (INDEPENDENT_AMBULATORY_CARE_PROVIDER_SITE_OTHER): Payer: Self-pay | Admitting: Family Medicine

## 2023-04-29 VITALS — BP 127/78 | HR 78 | Temp 98.0°F | Ht 65.0 in | Wt 329.0 lb

## 2023-04-29 DIAGNOSIS — E66813 Obesity, class 3: Secondary | ICD-10-CM | POA: Diagnosis not present

## 2023-04-29 DIAGNOSIS — Z7985 Long-term (current) use of injectable non-insulin antidiabetic drugs: Secondary | ICD-10-CM

## 2023-04-29 DIAGNOSIS — I152 Hypertension secondary to endocrine disorders: Secondary | ICD-10-CM

## 2023-04-29 DIAGNOSIS — E1165 Type 2 diabetes mellitus with hyperglycemia: Secondary | ICD-10-CM

## 2023-04-29 DIAGNOSIS — E1159 Type 2 diabetes mellitus with other circulatory complications: Secondary | ICD-10-CM

## 2023-04-29 DIAGNOSIS — Z7984 Long term (current) use of oral hypoglycemic drugs: Secondary | ICD-10-CM

## 2023-04-29 DIAGNOSIS — Z6841 Body Mass Index (BMI) 40.0 and over, adult: Secondary | ICD-10-CM | POA: Diagnosis not present

## 2023-04-29 MED ORDER — METFORMIN HCL 750 MG PO TABS: 750.0000 mg | ORAL_TABLET | Freq: Two times a day (BID) | ORAL | 0 refills | Status: DC

## 2023-04-29 MED ORDER — OZEMPIC (1 MG/DOSE) 2 MG/1.5ML ~~LOC~~ SOPN: 1.0000 mg | PEN_INJECTOR | SUBCUTANEOUS | 0 refills | Status: DC

## 2023-04-29 MED ORDER — ATENOLOL 50 MG PO TABS: 25.0000 mg | ORAL_TABLET | Freq: Every day | ORAL | Status: DC

## 2023-04-29 NOTE — Assessment & Plan Note (Signed)
 Blood pressure well controlled again today and patient didn't take her medications.  Will decrease her atenolol to 25mg  daily.  Can continue with same dose of chlorthalidone and benicar.  Follow up on BP at next appointment.

## 2023-04-29 NOTE — Progress Notes (Unsigned)
   SUBJECTIVE:  Chief Complaint: Obesity  Interim History: Patient has been craving sweets more frequently recently.  She has been eating more protein recently and adding fiber like metamucil to help with staying consistent with her bowel movements. Isn't sure how much protein she is getting in daily.  She would like to get more activity in and wants to make sure she makes it to the gym.    Ruth [name] is here to discuss her progress with her obesity treatment plan. She is on the Category 3 Plan and states she is following her eating plan approximately 40 % of the time. She states she is not exercising.   OBJECTIVE: Visit Diagnoses: Problem List Items Addressed This Visit       Endocrine   DM (diabetes mellitus) (HCC)   Relevant Medications   metFORMIN HCl 750 MG TABS   Semaglutide, 1 MG/DOSE, (OZEMPIC, 1 MG/DOSE,) 2 MG/1.5ML SOPN    Vitals Temp: 98 F (36.7 C) BP: 127/78 Pulse Rate: 78 SpO2: 99 %   Anthropometric Measurements Height: 5\' 5"  (1.651 m) Weight: (!) 329 lb (149.2 kg) BMI (Calculated): 54.75 Weight at Last Visit: 327 lb Weight Lost Since Last Visit: 0 Weight Gained Since Last Visit: 2 Starting Weight: 335 lb Total Weight Loss (lbs): 6 lb (2.722 kg)   Body Composition  Body Fat %: 56.8 % Fat Mass (lbs): 186.8 lbs Muscle Mass (lbs): 135 lbs Total Body Water (lbs): 100.8 lbs Visceral Fat Rating : 25   Other Clinical Data Today's Visit #: 10 Starting Date: 07/30/22 Comments: Cat 3     ASSESSMENT AND PLAN:  Diet: Cecelia is currently in the action stage of change. As such, her goal is to continue with weight loss efforts and has agreed to the Category 3 Plan.   Exercise:  For substantial health benefits, adults should do at least 150 minutes (2 hours and 30 minutes) a week of moderate-intensity, or 75 minutes (1 hour and 15 minutes) a week of vigorous-intensity aerobic physical activity, or an equivalent combination of moderate- and vigorous-intensity  aerobic activity. Aerobic activity should be performed in episodes of at least 10 minutes, and preferably, it should be spread throughout the week.  Behavior Modification:  We discussed the following Behavioral Modification Strategies today: increasing lean protein intake, decreasing simple carbohydrates, increasing vegetables, meal planning and cooking strategies, avoiding temptations, and planning for success.   No follow-ups on file.Marland Kitchen She was informed of the importance of frequent follow up visits to maximize her success with intensive lifestyle modifications for her multiple health conditions.  Attestation Statements:   Reviewed by clinician on day of visit: allergies, medications, problem list, medical history, surgical history, family history, social history, and previous encounter notes.    Reuben Likes, MD

## 2023-05-01 ENCOUNTER — Ambulatory Visit (HOSPITAL_BASED_OUTPATIENT_CLINIC_OR_DEPARTMENT_OTHER)
Admission: RE | Admit: 2023-05-01 | Discharge: 2023-05-01 | Disposition: A | Discharge status: Home / Self Care | Source: Ambulatory Visit | Attending: Radiology | Admitting: Radiology

## 2023-05-01 ENCOUNTER — Telehealth: Payer: Self-pay

## 2023-05-01 DIAGNOSIS — Z13820 Encounter for screening for osteoporosis: Secondary | ICD-10-CM | POA: Diagnosis present

## 2023-05-01 NOTE — Telephone Encounter (Signed)
 Copied from CRM [id number]. Topic: Clinical - Medication Question >> May 01, 2023  2:45 PM Suzette B wrote: Reason for CRM: Pt is requesting that someone call her in regards to the medication Benicar 40 MG. Patient stated she's not sure of the provider took her off the medication, but the last time she had it was a 90 day refill, she just needs to know if the medication was discontinue. Summary  Patient also needed to know with the appt that is scheduled for next week was it labs involved.   Spoke with patient this afternoon wanted to get a clarification on her medication Benicar because she was not sure if Medina-Vargas, Monina C, NP discontinue her medication.  I review medication with her over the phone and let her that the medication is still on med list and has not been discontinue. Patient also wanted to ask Medina-Vargas, Monina C, NP will she need to labs at her appointment on next Monday because she wanted asked does she need to fast for the appointment or come in early to get her labs done.  Please Advise  Message sent to Medina-Vargas, Margit Banda, NP

## 2023-05-02 NOTE — Telephone Encounter (Signed)
 Copied from CRM [id number]. Topic: Clinical - Medication Question >> May 01, 2023  2:45 PM Ruth [name] wrote: Reason for CRM: Pt is requesting that someone call her in regards to the medication Benicar 40 MG. Patient stated she's not sure of the provider took her off the medication, but the last time she had it was a 90 day refill, she just needs to know if the medication was discontinue. >> May 02, 2023  3:15 PM Hamdi H wrote: Patient missed a call from the nurse, I tried connecting her with [facility] nurse but they were not available at the moment. Please give patient a call back again at her cell phone number.

## 2023-05-02 NOTE — Assessment & Plan Note (Signed)
 On metformin and semaglutide.  Working on getting all food in off meal plan consistently.  Needs refills of both medications today.

## 2023-05-02 NOTE — Assessment & Plan Note (Signed)
 Anthropometric Measurements Height: 5\' 5"  (1.651 m) Weight: (!) 329 lb (149.2 kg) BMI (Calculated): 54.75 Weight at Last Visit: 327 lb Weight Lost Since Last Visit: 0 Weight Gained Since Last Visit: 2 Starting Weight: 335 lb Total Weight Loss (lbs): 6 lb (2.722 kg) Body Composition  Body Fat %: 56.8 % Fat Mass (lbs): 186.8 lbs Muscle Mass (lbs): 135 lbs Total Body Water (lbs): 100.8 lbs Visceral Fat Rating : 25 Other Clinical Data Today's Visit #: 10 Starting Date: 07/30/22 Comments: Cat 3

## 2023-05-02 NOTE — Telephone Encounter (Signed)
Called patient and no answer. Voicemail was left with office call back number.   

## 2023-05-02 NOTE — Telephone Encounter (Signed)
 Patient called and notified. Patient states that she will reschedule appointment from afternoon to morning appointment due to fasting per PCP Medina-Vargas, Margit Banda, NP

## 2023-05-02 NOTE — Telephone Encounter (Signed)
 Olmesartan is still on her current medication list and will need to fast for the next clinic visit so [facility] can be re-checked.

## 2023-05-05 ENCOUNTER — Ambulatory Visit: Admitting: Adult Health

## 2023-05-08 ENCOUNTER — Ambulatory Visit (INDEPENDENT_AMBULATORY_CARE_PROVIDER_SITE_OTHER): Admitting: Adult Health

## 2023-05-08 ENCOUNTER — Encounter: Payer: Self-pay | Admitting: Adult Health

## 2023-05-08 VITALS — BP 120/84 | HR 90 | Temp 97.3°F | Resp 21 | Ht 65.0 in | Wt 336.4 lb

## 2023-05-08 DIAGNOSIS — I152 Hypertension secondary to endocrine disorders: Secondary | ICD-10-CM

## 2023-05-08 DIAGNOSIS — E7849 Other hyperlipidemia: Secondary | ICD-10-CM

## 2023-05-08 DIAGNOSIS — E1165 Type 2 diabetes mellitus with hyperglycemia: Secondary | ICD-10-CM

## 2023-05-08 DIAGNOSIS — E1159 Type 2 diabetes mellitus with other circulatory complications: Secondary | ICD-10-CM

## 2023-05-08 NOTE — Progress Notes (Signed)
 Ruth [name]  Provider:  Kenard Gower DNP  Code Status:  Full Code  Goals of Care:     05/08/2023    9:30 AM  Advanced Directives  Does Patient Have a Medical Advance Directive? No  Would patient like information on creating a medical advance directive? No - Patient declined     Chief Complaint  Patient presents with   Follow-up    3 month follow up    Discussed the use of AI scribe software for clinical note transcription with the patient, who gave verbal consent to proceed.  HPI: Patient is a 63 y.o. female seen today for a 18-month follow up of chronic medical issues.  She has hypertension, which is well-controlled with her current medication regimen. Her recent blood pressure reading was 120/84 mmHg. She is taking atenolol 25 mg daily, chlortalidone 12.5 mg every morning, and olmesartan 40 mg daily.  Her diabetes management includes metformin 750 mg twice a day, although she sometimes takes it only once a day, and Ozempic once a week. Her last HbA1c was 9.8%, down from 11.7% in September. Her blood sugar was 170 mg/dL 3 days ago. Despite these measures, she has experienced a weight increase from 329 lbs to 336 lbs over the past week.  She reports knee pain with swelling, which she attributes to her weight. She uses topical treatments like Voltaren for relief.  Additionally, she experiences pain in her right wrist, particularly in the middle, which affects her ability to lift objects. She uses a splint at night for relief.  In terms of lifestyle, she does not exercise regularly and has not visited the gym since joining in January. She plans to cancel her membership and considers walking in the park as an alternative. She does not smoke or drink alcohol but consumes sweets and ice cream.    Past Medical History:  Diagnosis Date   Anemia    Arthritis    Back pain    Bilateral calcaneal spurs    Bilateral lower extremity edema    Chest pain    Diabetes (HCC)     Diabetes mellitus without complication (HCC)    type 2   Gallbladder problem    GERD (gastroesophageal reflux disease)    Gout    H/O blood clots    Heartburn    Hyperlipidemia    Hypertension    Obesity    Osteoarthritis    Palpitations    SOBOE (shortness of breath on exertion)    Swallowing difficulty    Type 2 diabetes mellitus (HCC)    Vitamin D deficiency     Past Surgical History:  Procedure Laterality Date   ABDOMINAL HYSTERECTOMY     partial   CHOLECYSTECTOMY     COLONOSCOPY WITH PROPOFOL N/A 10/29/2016   Procedure: COLONOSCOPY WITH PROPOFOL;  Surgeon: Benancio Deeds, MD;  Location: [facility];  Service: Gastroenterology;  Laterality: N/A;   COLONOSCOPY WITH PROPOFOL N/A 10/01/2022   Procedure: COLONOSCOPY WITH PROPOFOL;  Surgeon: Benancio Deeds, MD;  Location: [facility];  Service: Gastroenterology;  Laterality: N/A;  BMI = 57   DENTAL SURGERY     ORIF TIBIA & FIBULA FRACTURES Left    POLYPECTOMY  10/01/2022   Procedure: POLYPECTOMY;  Surgeon: Benancio Deeds, MD;  Location: [facility];  Service: Gastroenterology;;    Allergies  Allergen Reactions   Lisinopril Swelling    Throat swelling   Hydrochlorothiazide     Other Reaction(s): sleepy   Tape Other (  See Comments)    Certain Band aids cause skin irritation    Outpatient Encounter Medications as of 05/08/2023  Medication Sig   atenolol (TENORMIN) 50 MG tablet Take 0.5 tablets (25 mg total) by mouth daily.   atorvastatin (LIPITOR) 40 MG tablet TAKE 1 TABLET BY MOUTH 3 TIMES A WEEK   Blood Glucose Monitoring Suppl (ACCU-CHEK GUIDE) w/Device KIT 1 Device by Does not apply route daily. CBG daily, pls give strips and lancets   chlorthalidone (HYGROTON) 25 MG tablet Take 0.5 tablets (12.5 mg total) by mouth every morning.   Cholecalciferol 1.25 MG (50000 UT) capsule Take 1 capsule (50,000 Units total) by mouth once a week.   glucose blood (ACCU-CHEK GUIDE) test strip Use as instructed    metFORMIN HCl 750 MG TABS Take 750 mg by mouth 2 (two) times daily with a meal.   olmesartan (BENICAR) 40 MG tablet Take 1 tablet (40 mg total) by mouth daily.   Semaglutide, 1 MG/DOSE, (OZEMPIC, 1 MG/DOSE,) 2 MG/1.5ML SOPN Inject 1 mg into the skin once a week.   No facility-administered encounter medications on file as of 05/08/2023.    Review of Systems:  Review of Systems  Constitutional:  Negative for appetite change, chills, fatigue and fever.  HENT:  Negative for congestion, hearing loss, rhinorrhea and sore throat.   Eyes: Negative.   Respiratory:  Negative for cough, shortness of breath and wheezing.   Cardiovascular:  Negative for chest pain, palpitations and leg swelling.  Gastrointestinal:  Negative for abdominal pain, constipation, diarrhea, nausea and vomiting.  Genitourinary:  Negative for dysuria.  Musculoskeletal:  Negative for arthralgias, back pain and myalgias.  Skin:  Negative for color change, rash and wound.  Neurological:  Negative for dizziness, weakness and headaches.  Psychiatric/Behavioral:  Negative for behavioral problems. The patient is not nervous/anxious.     Health Maintenance  Topic Date Due   COVID-19 Vaccine (4 - 2024-25 season) 05/15/2023 (Originally 10/13/2022)   Zoster Vaccines- Shingrix (1 of 2) 08/08/2023 (Originally 10/15/1979)   FOOT EXAM  07/11/2023   OPHTHALMOLOGY EXAM  07/17/2023   HEMOGLOBIN A1C  11/08/2023   Diabetic kidney evaluation - eGFR measurement  05/07/2024   Diabetic kidney evaluation - Urine ACR  05/07/2024   MAMMOGRAM  08/04/2024   Cervical Cancer Screening (HPV/Pap Cotest)  04/22/2028   DTaP/Tdap/Td (2 - Td or Tdap) 10/25/2028   Colonoscopy  09/30/2032   Pneumococcal Vaccine 25-43 Years old  Completed   INFLUENZA VACCINE  Completed   Hepatitis C Screening  Completed   HIV Screening  Completed   HPV VACCINES  Aged Out    Physical Exam: Vitals:   05/07/23 1526  BP: 120/84  Pulse: 90  Resp: (!) 21  Temp: (!) 97.3 F  (36.3 C)  SpO2: 97%  Weight: (!) 336 lb 6.4 oz (152.6 kg)  Height: 5\' 5"  (1.651 m)   Body mass index is 55.98 kg/m. Physical Exam Constitutional:      Appearance: She is obese.  HENT:     Head: Normocephalic and atraumatic.     Nose: Nose normal.     Mouth/Throat:     Mouth: Mucous membranes are moist.  Eyes:     Conjunctiva/sclera: Conjunctivae normal.  Cardiovascular:     Rate and Rhythm: Normal rate and regular rhythm.  Pulmonary:     Effort: Pulmonary effort is normal.     Breath sounds: Normal breath sounds.  Abdominal:     General: Bowel sounds are normal.  Palpations: Abdomen is soft.  Musculoskeletal:        General: Normal range of motion.     Cervical back: Normal range of motion.  Skin:    General: Skin is warm and dry.  Neurological:     General: No focal deficit present.     Mental Status: She is alert and oriented to person, place, and time.  Psychiatric:        Mood and Affect: Mood normal.        Behavior: Behavior normal.        Thought Content: Thought content normal.        Judgment: Judgment normal.     Labs reviewed: Basic Metabolic Panel: Recent Labs    07/30/22 0843 01/24/23 0857 05/08/23 1007  NA 138 140 140  K 5.0 4.0 4.2  CL 101 103 103  CO2 24 30 27   GLUCOSE 363* 227* 230*  BUN 20 16 17   CREATININE 0.93 0.80 0.75  CALCIUM 10.0 9.9 9.6  TSH 2.140  --   --    Liver Function Tests: Recent Labs    07/30/22 0843 01/24/23 0857  AST 15 15  ALT 18 19  ALKPHOS 101  --   BILITOT 0.4 0.3  PROT 6.8 6.6  ALBUMIN 4.1  --    No results for input(s): "LIPASE", "AMYLASE" in the last 8760 hours. No results for input(s): "AMMONIA" in the last 8760 hours. CBC: Recent Labs    07/30/22 0843 01/24/23 0857 05/08/23 1007  WBC 5.7 7.0 7.2  NEUTROABS 3.4 4,438 4,615  HGB 12.9 12.2 11.9  HCT 40.2 38.7 36.3  MCV 94 94.4 89.9  PLT 204 250 235   Lipid Panel: Recent Labs    01/24/23 0857 05/08/23 1007  CHOL 145 152  HDL 47* 49*   LDLCALC 82 84  TRIG 80 93  CHOLHDL 3.1 3.1   Lab Results  Component Value Date   HGBA1C 9.2 (H) 05/08/2023    Procedures since last visit: DG Bone Density Result Date: 05/01/2023 EXAM: DUAL X-RAY ABSORPTIOMETRY (DXA) FOR BONE MINERAL DENSITY IMPRESSION: Referring Physician:  Tanda Rockers Your patient completed a bone mineral density test using GE Lunar iDXA system (analysis version: 16). Technologist: ALW PATIENT: Name: Ruth [name], Ruth [name] Patient ID: [id number] Birth Date: [birth year] Height: 65.0 in. Sex: Female Measured: 05/01/2023 Weight: 329.0 lbs. Indications: Diabetic (non-insulin), Estrogen Deficiency, History of Fracture (Adult), Post Menopausal Fractures: Tib/Fib Treatments: Vitamin D ASSESSMENT: The BMD measured at Left Forearm Radius 33% is 0.903 g/cm2 with a T-score of 0.3. This patient is considered normal according to World Health Organization [facility]) criteria. Scan quality is good. Exclusions: Lumbar Spine degenerative changes. Site Region Measured Date Measured Age YA BMD Significant CHANGE T-score Left Forearm Radius 33% 05/01/2023 62.5 0.3 0.903 g/cm2 DualFemur Neck Right 05/01/2023 62.5 2.3 1.356 g/cm2 DualFemur Total Mean 5/01/2023 62.5 2.8 1.359 g/cm2 World Health Organization [facility]) criteria for post-menopausal, Caucasian Women: Normal       T-score at or above -1 SD Osteopenia   T-score between -1 and -2.5 SD Osteoporosis T-score at or below -2.5 SD RECOMMENDATION: 1. All patients should optimize calcium and vitamin D intake. 2. Consider FDA approved medical therapies in postmenopausal women and men aged 60 years and older, based on the following: a. A hip or vertebral (clinical or morphometric) fracture b. T-score = -2.5 at the femoral neck or spine after appropriate evaluation to exclude secondary causes c. Low bone mass (T-score between -1.0 and -2.5 at the  femoral neck or spine) and a 10- year probability of a hip fracture = 3% or a 10 year probability of a major  osteoporosis-related fracture = 20% based on the [place]-adapted WHO algorithm. 3. Clinician judgement and/or patient preference may indicate treatment for people with10-year fracture probabilities above or below these levels. FOLLOW-UP: Patients with diagnosis of osteoporosis or at high risk for fracture should have regular bone mineral density tests. For patients eligible for Medicare routine testing is allowed once every 2 years. The testing frequency can be increased to one year for patients who have rapidly progressing disease, those who are receiving or discontinuing medical therapy to restore bone mass, or have additional risk factors. I have reviewed this study and agree with the findings. [facility] Electronically Signed   By: Harmon Pier M.D.   On: 05/01/2023 14:42    Assessment/Plan  1. Type 2 diabetes mellitus with hyperglycemia, unspecified whether long term insulin use (HCC) (Primary) -  Suboptimal control with recent blood sugar at 170 mg/dL and ZOX0R at 6.0%. Inconsistent metformin use noted. Weight gain despite Ozempic. - Reinforce metformin 750 mg twice daily. - Continue Ozempic once weekly. - Hemoglobin A1C - CBC with Differential/Platelets - Microalbumin/Creatinine Ratio, Urine  2. Morbidly obese (HCC) -  Weight gain to 336 lbs. Lack of exercise noted. Discussed exercise benefits on weight and knee pain. - Encourage regular physical activity. - Discuss weight loss benefits on knee pain and health.  3. Hypertension associated with diabetes (HCC) -  Well-controlled at 120/84 mmHg. Current regimen effective. Potential to reduce medication if stable. - Continue atenolol 25 mg daily. - Continue chlortalidone 12.5 mg daily. - Continue olmesartan 40 mg daily. - Basic Metabolic Panel with eGFR  4. Other hyperlipidemia -  continue Atorvastatin -  Encourage exercise for HDL improvement. - Lipid panel      Labs/tests ordered:  lipid panel, BMP, A1C, CBC with  Differential/Platelets  and Microalbumin/Creatinine Ratio, Urine   Return in about 3 months (around 08/08/2023).  Kenard Gower, NP

## 2023-05-09 LAB — CBC WITH DIFFERENTIAL/PLATELET
Absolute Lymphocytes: 1814 {cells}/uL (ref 850–3900)
Absolute Monocytes: 576 {cells}/uL (ref 200–950)
Basophils Absolute: 43 {cells}/uL (ref 0–200)
Basophils Relative: 0.6 %
Eosinophils Absolute: 151 {cells}/uL (ref 15–500)
Eosinophils Relative: 2.1 %
HCT: 36.3 % (ref 35.0–45.0)
Hemoglobin: 11.9 g/dL (ref 11.7–15.5)
MCH: 29.5 pg (ref 27.0–33.0)
MCHC: 32.8 g/dL (ref 32.0–36.0)
MCV: 89.9 fL (ref 80.0–100.0)
MPV: 11.7 fL (ref 7.5–12.5)
Monocytes Relative: 8 %
Neutro Abs: 4615 {cells}/uL (ref 1500–7800)
Neutrophils Relative %: 64.1 %
Platelets: 235 10*3/uL (ref 140–400)
RBC: 4.04 10*6/uL (ref 3.80–5.10)
RDW: 13.1 % (ref 11.0–15.0)
Total Lymphocyte: 25.2 %
WBC: 7.2 10*3/uL (ref 3.8–10.8)

## 2023-05-09 LAB — BASIC METABOLIC PANEL WITHOUT GFR
BUN: 17 mg/dL (ref 7–25)
CO2: 27 mmol/L (ref 20–32)
Calcium: 9.6 mg/dL (ref 8.6–10.4)
Chloride: 103 mmol/L (ref 98–110)
Creat: 0.75 mg/dL (ref 0.50–1.05)
Glucose, Bld: 230 mg/dL — ABNORMAL HIGH (ref 65–99)
Potassium: 4.2 mmol/L (ref 3.5–5.3)
Sodium: 140 mmol/L (ref 135–146)

## 2023-05-09 LAB — LIPID PANEL
Cholesterol: 152 mg/dL (ref <200)
HDL: 49 mg/dL — ABNORMAL LOW (ref >=50)
LDL Cholesterol (Calc): 84 mg/dL
Non-HDL Cholesterol (Calc): 103 mg/dL (ref <130)
Total CHOL/HDL Ratio: 3.1 (calc) (ref <5.0)
Triglycerides: 93 mg/dL (ref <150)

## 2023-05-09 LAB — MICROALBUMIN / CREATININE URINE RATIO
Creatinine, Urine: 92 mg/dL (ref 20–275)
Microalb Creat Ratio: 5 mg/g{creat} (ref <30)
Microalb, Ur: 0.5 mg/dL

## 2023-05-09 LAB — HEMOGLOBIN A1C
Hgb A1c MFr Bld: 9.2 %{Hb} — ABNORMAL HIGH (ref <5.7)
Mean Plasma Glucose: 217 mg/dL
eAG (mmol/L): 12 mmol/L

## 2023-05-11 NOTE — Progress Notes (Signed)
-  Urine microalbumin/creatinine ratio normal -No anemia -Electrolytes normal -   Cholesterol, triglycerides and LDL normal -   A1c 9.2, down from 9.8 (01/24/2023) -   No new orders, continue current orders

## 2023-05-22 ENCOUNTER — Encounter (INDEPENDENT_AMBULATORY_CARE_PROVIDER_SITE_OTHER): Payer: Self-pay | Admitting: Family Medicine

## 2023-05-26 ENCOUNTER — Other Ambulatory Visit (INDEPENDENT_AMBULATORY_CARE_PROVIDER_SITE_OTHER): Payer: Self-pay

## 2023-05-26 DIAGNOSIS — E1165 Type 2 diabetes mellitus with hyperglycemia: Secondary | ICD-10-CM

## 2023-05-26 MED ORDER — OZEMPIC (1 MG/DOSE) 2 MG/1.5ML ~~LOC~~ SOPN
1.0000 mg | PEN_INJECTOR | SUBCUTANEOUS | 0 refills | Status: DC
Start: 2023-05-26 — End: 2023-06-25

## 2023-06-05 ENCOUNTER — Encounter (INDEPENDENT_AMBULATORY_CARE_PROVIDER_SITE_OTHER): Payer: Self-pay | Admitting: Family Medicine

## 2023-06-05 ENCOUNTER — Ambulatory Visit (INDEPENDENT_AMBULATORY_CARE_PROVIDER_SITE_OTHER): Admitting: Family Medicine

## 2023-06-05 VITALS — BP 110/82 | HR 86 | Temp 98.0°F | Ht 65.0 in | Wt 327.0 lb

## 2023-06-05 DIAGNOSIS — E1165 Type 2 diabetes mellitus with hyperglycemia: Secondary | ICD-10-CM

## 2023-06-05 DIAGNOSIS — I152 Hypertension secondary to endocrine disorders: Secondary | ICD-10-CM

## 2023-06-05 DIAGNOSIS — E669 Obesity, unspecified: Secondary | ICD-10-CM | POA: Diagnosis not present

## 2023-06-05 DIAGNOSIS — E1159 Type 2 diabetes mellitus with other circulatory complications: Secondary | ICD-10-CM | POA: Diagnosis not present

## 2023-06-05 DIAGNOSIS — Z7984 Long term (current) use of oral hypoglycemic drugs: Secondary | ICD-10-CM

## 2023-06-05 DIAGNOSIS — Z6841 Body Mass Index (BMI) 40.0 and over, adult: Secondary | ICD-10-CM

## 2023-06-05 NOTE — Progress Notes (Unsigned)
 SUBJECTIVE:  Chief Complaint: Obesity  Interim History: Patient has had a good month.  Hasn't been doing much activity because she canceled her gym membership.  She has been maintaining somewhat with getting her nutrition in.  Planning to go to the park possibly today.  She started weighing some of the protein she is taking in.  She has been eating some sweets and does report overindulging over the Easter holiday.   Ruth [name] is here to discuss her progress with her obesity treatment plan. She is on the Category 3 Plan and states she is following her eating plan approximately 70 % of the time. She states she is not exercising much.   OBJECTIVE: Visit Diagnoses: Problem List Items Addressed This Visit       Cardiovascular and Mediastinum   Hypertension associated with diabetes (HCC) - Primary   Variable blood pressure today.  She is on atenolol , chlorthalidone  and benicar .  No chest pain, chest pressure or headache.  Needs a refill of chlorthalidone  today. If BP stays slightly low will further decrease her medications.      Relevant Medications   metFORMIN  (GLUCOPHAGE -XR) 750 MG 24 hr tablet     Endocrine   Type 2 diabetes mellitus with hyperglycemia (HCC)   On metformin  and ozempic  for management of her diabetes.  Will continue both medications and refill ozempic  today.       Relevant Medications   metFORMIN  (GLUCOPHAGE -XR) 750 MG 24 hr tablet     Other   BMI 50.0-59.9, adult (HCC)   Relevant Medications   metFORMIN  (GLUCOPHAGE -XR) 750 MG 24 hr tablet   Obesity with starting BMI of 57.5   Relevant Medications   metFORMIN  (GLUCOPHAGE -XR) 750 MG 24 hr tablet    Vitals Temp: 98 F (36.7 C) BP: 110/82 Pulse Rate: 86 SpO2: 99 %   Anthropometric Measurements Height: 5\' 5"  (1.651 m) Weight: (!) 327 lb (148.3 kg) BMI (Calculated): 54.42 Weight at Last Visit: 329 lb Weight Lost Since Last Visit: 2 Weight Gained Since Last Visit: 0 Starting Weight: 335 lb Total Weight  Loss (lbs): 8 lb (3.629 kg)   Body Composition  Body Fat %: 55 % Fat Mass (lbs): 179.8 lbs Muscle Mass (lbs): 139.8 lbs Total Body Water (lbs): 99.6 lbs Visceral Fat Rating : 23   Other Clinical Data Fasting: yes Labs: yes Today's Visit #: 11 Starting Date: 07/30/22 Comments: Cat 3     ASSESSMENT AND PLAN:  Diet: Ruth [name] is currently in the action stage of change. As such, her goal is to continue with weight loss efforts and has agreed to the Category 3 Plan.  Wants to focus on one meal at a time- will focus on dinner daily.   Exercise:  For substantial health benefits, adults should do at least 150 minutes (2 hours and 30 minutes) a week of moderate-intensity, or 75 minutes (1 hour and 15 minutes) a week of vigorous-intensity aerobic physical activity, or an equivalent combination of moderate- and vigorous-intensity aerobic activity. Aerobic activity should be performed in episodes of at least 10 minutes, and preferably, it should be spread throughout the week.  Behavior Modification:  We discussed the following Behavioral Modification Strategies today: increasing lean protein intake, decreasing simple carbohydrates, meal planning and cooking strategies, avoiding temptations, and planning for success.   Return in about 3 weeks (around 06/26/2023).Ruth [name] She was informed of the importance of frequent follow up visits to maximize her success with intensive lifestyle modifications for her multiple health conditions.  Attestation Statements:  Reviewed by clinician on day of visit: allergies, medications, problem list, medical history, surgical history, family history, social history, and previous encounter notes.   Donaciano Frizzle, MD

## 2023-06-05 NOTE — Assessment & Plan Note (Signed)
 On metformin  and ozempic  for management of her diabetes.  Will continue both medications and refill ozempic  today.

## 2023-06-05 NOTE — Assessment & Plan Note (Signed)
 Variable blood pressure today.  She is on atenolol , chlorthalidone  and benicar .  No chest pain, chest pressure or headache.  Needs a refill of chlorthalidone  today. If BP stays slightly low will further decrease her medications.

## 2023-06-24 ENCOUNTER — Other Ambulatory Visit (INDEPENDENT_AMBULATORY_CARE_PROVIDER_SITE_OTHER): Payer: Self-pay | Admitting: Family Medicine

## 2023-06-24 DIAGNOSIS — E1165 Type 2 diabetes mellitus with hyperglycemia: Secondary | ICD-10-CM

## 2023-06-24 NOTE — Telephone Encounter (Signed)
 Patient called in stating she needs a refill for Ozempic . Pt reports she took her last dose today and she does not have another visit until 6/3. Pt reports a prescription was supposed to be sent in on her last visit on 4/24. Please follow up with patient.

## 2023-06-24 NOTE — Telephone Encounter (Deleted)
 Patient called in stating she needs a refill for Ozempic . Pt reports she took her last dose today and she does not have another visit until 6/3. Pt reports a prescription was supposed to be sent in on her last visit on 4/24. Please follow up with patient.

## 2023-06-25 ENCOUNTER — Encounter (INDEPENDENT_AMBULATORY_CARE_PROVIDER_SITE_OTHER): Payer: Self-pay

## 2023-07-15 ENCOUNTER — Encounter (INDEPENDENT_AMBULATORY_CARE_PROVIDER_SITE_OTHER): Payer: Self-pay | Admitting: Family Medicine

## 2023-07-15 ENCOUNTER — Ambulatory Visit (INDEPENDENT_AMBULATORY_CARE_PROVIDER_SITE_OTHER): Admitting: Family Medicine

## 2023-07-15 VITALS — BP 118/64 | HR 94 | Temp 98.1°F | Ht 65.0 in | Wt 326.0 lb

## 2023-07-15 DIAGNOSIS — I152 Hypertension secondary to endocrine disorders: Secondary | ICD-10-CM | POA: Diagnosis not present

## 2023-07-15 DIAGNOSIS — Z7985 Long-term (current) use of injectable non-insulin antidiabetic drugs: Secondary | ICD-10-CM

## 2023-07-15 DIAGNOSIS — Z6841 Body Mass Index (BMI) 40.0 and over, adult: Secondary | ICD-10-CM

## 2023-07-15 DIAGNOSIS — E119 Type 2 diabetes mellitus without complications: Secondary | ICD-10-CM

## 2023-07-15 DIAGNOSIS — E1159 Type 2 diabetes mellitus with other circulatory complications: Secondary | ICD-10-CM

## 2023-07-15 DIAGNOSIS — E1165 Type 2 diabetes mellitus with hyperglycemia: Secondary | ICD-10-CM

## 2023-07-15 DIAGNOSIS — Z7984 Long term (current) use of oral hypoglycemic drugs: Secondary | ICD-10-CM

## 2023-07-15 MED ORDER — CHLORTHALIDONE 25 MG PO TABS
12.5000 mg | ORAL_TABLET | Freq: Every morning | ORAL | 2 refills | Status: DC
Start: 2023-07-15 — End: 2023-10-20

## 2023-07-15 MED ORDER — OZEMPIC (1 MG/DOSE) 4 MG/3ML ~~LOC~~ SOPN: 1.0000 mg | PEN_INJECTOR | SUBCUTANEOUS | 0 refills | Status: DC

## 2023-07-15 MED ORDER — METFORMIN HCL 750 MG PO TABS: 750.0000 mg | ORAL_TABLET | Freq: Two times a day (BID) | ORAL | 0 refills | Status: DC

## 2023-07-15 NOTE — Progress Notes (Signed)
 SUBJECTIVE:  Chief Complaint: Obesity  Interim History: Patient mentions she has been Ruth [name] to get in a bit of exercise since last appointment.  She unfortunately has not been Ruth [name] to commit to weighing her protein out for lunch and dinner. Lunch is skipped because breakfast was a bit late and dinner is sometimes after 8pm.  She will have her meat and vegetable and sometimes meat. For this month she doesn't have anything big going on.    Ruth [name] is here to discuss her progress with her obesity treatment plan. She is on the Category 3 Plan and states she is following her eating plan approximately 50 % of the time. She states she is walking 1.5 miles 1 time per week.   OBJECTIVE: Visit Diagnoses: Problem List Items Addressed This Visit       Cardiovascular and Mediastinum   Hypertension associated with diabetes (HCC) - Primary   Blood pressure well controlled today.  No chest pain, chest pressure, headache.  Needs a refill of chlorthalidone  today.       Relevant Medications   chlorthalidone  (HYGROTON ) 25 MG tablet   metFORMIN  HCl 750 MG TABS   Semaglutide , 1 MG/DOSE, (OZEMPIC , 1 MG/DOSE,) 4 MG/3ML SOPN     Endocrine   DM (diabetes mellitus) (HCC)   On combination pharmacotherapy of metformin  and ozempic .  No GI side effects of either.  Patient is working toward increasing her intake to be more protein centered and decreasing simple carbohydrates.      Relevant Medications   metFORMIN  HCl 750 MG TABS   Semaglutide , 1 MG/DOSE, (OZEMPIC , 1 MG/DOSE,) 4 MG/3ML SOPN     Other   BMI 50.0-59.9, adult (HCC)   Relevant Medications   metFORMIN  HCl 750 MG TABS   Semaglutide , 1 MG/DOSE, (OZEMPIC , 1 MG/DOSE,) 4 MG/3ML SOPN   Morbidly obese (HCC)   Anthropometric Measurements Height: 5' 5 (1.651 m) Weight: (!) 326 lb (147.9 kg) BMI (Calculated): 54.25 Weight at Last Visit: 327 lb Weight Lost Since Last Visit: 1 Weight Gained Since Last Visit: 0 Starting Weight: 335 lb Total Weight  Loss (lbs): 9 lb (4.082 kg) Body Composition  Body Fat %: 55.9 % Fat Mass (lbs): 182.4 lbs Muscle Mass (lbs): 136.6 lbs Total Body Water (lbs): 103.4 lbs Visceral Fat Rating : 23 Other Clinical Data Today's Visit #: 12 Starting Date: 07/30/22 Comments: Cat 3       Relevant Medications   metFORMIN  HCl 750 MG TABS   Semaglutide , 1 MG/DOSE, (OZEMPIC , 1 MG/DOSE,) 4 MG/3ML SOPN   Obesity with starting BMI of 57.5   Relevant Medications   metFORMIN  HCl 750 MG TABS   Semaglutide , 1 MG/DOSE, (OZEMPIC , 1 MG/DOSE,) 4 MG/3ML SOPN    No data recorded       07/15/2023    3:32 PM 07/15/2023    3:00 PM 06/05/2023    8:53 AM  Vitals with BMI  Height  5' 5   Weight  326 lbs   BMI  54.25   Systolic 118 89 110  Diastolic 64 50 82  Pulse  94       ASSESSMENT AND PLAN:  Diet: Ruth [name] is currently in the action stage of change. As such, her goal is to continue with weight loss efforts and has agreed to the Category 3 Plan.  Wants to make a commitment to weighing and measuring her food especially her protein.  Mentions she has to eat down some of the food she has in her house due to  cost.   Exercise:  For substantial health benefits, adults should do at least 150 minutes (2 hours and 30 minutes) a week of moderate-intensity, or 75 minutes (1 hour and 15 minutes) a week of vigorous-intensity aerobic physical activity, or an equivalent combination of moderate- and vigorous-intensity aerobic activity. Aerobic activity should be performed in episodes of at least 10 minutes, and preferably, it should be spread throughout the week.  Behavior Modification:  We discussed the following Behavioral Modification Strategies today: increasing lean protein intake, decreasing simple carbohydrates, increasing vegetables, meal planning and cooking strategies, and keeping healthy foods in the home.   Return in about 4 weeks (around 08/12/2023).   She was informed of the importance of frequent follow up visits  to maximize her success with intensive lifestyle modifications for her multiple health conditions.  Attestation Statements:   Reviewed by clinician on day of visit: allergies, medications, problem list, medical history, surgical history, family history, social history, and previous encounter notes.     Donaciano Frizzle, MD

## 2023-07-21 LAB — HM DIABETES EYE EXAM

## 2023-07-24 NOTE — Assessment & Plan Note (Signed)
 On combination pharmacotherapy of metformin  and ozempic .  No GI side effects of either.  Patient is working toward increasing her intake to be more protein centered and decreasing simple carbohydrates.

## 2023-07-24 NOTE — Assessment & Plan Note (Signed)
 Anthropometric Measurements Height: 5' 5 (1.651 m) Weight: (!) 326 lb (147.9 kg) BMI (Calculated): 54.25 Weight at Last Visit: 327 lb Weight Lost Since Last Visit: 1 Weight Gained Since Last Visit: 0 Starting Weight: 335 lb Total Weight Loss (lbs): 9 lb (4.082 kg) Body Composition  Body Fat %: 55.9 % Fat Mass (lbs): 182.4 lbs Muscle Mass (lbs): 136.6 lbs Total Body Water (lbs): 103.4 lbs Visceral Fat Rating : 23 Other Clinical Data Today's Visit #: 12 Starting Date: 07/30/22 Comments: Cat 3

## 2023-07-24 NOTE — Assessment & Plan Note (Signed)
 Blood pressure well controlled today.  No chest pain, chest pressure, headache.  Needs a refill of chlorthalidone  today.

## 2023-08-04 ENCOUNTER — Other Ambulatory Visit: Payer: Self-pay | Admitting: Adult Health

## 2023-08-04 DIAGNOSIS — E782 Mixed hyperlipidemia: Secondary | ICD-10-CM

## 2023-08-08 ENCOUNTER — Ambulatory Visit: Admitting: Adult Health

## 2023-08-08 ENCOUNTER — Ambulatory Visit (INDEPENDENT_AMBULATORY_CARE_PROVIDER_SITE_OTHER): Admitting: Adult Health

## 2023-08-08 VITALS — BP 136/86 | HR 88 | Temp 97.8°F | Ht 65.0 in | Wt 331.0 lb

## 2023-08-08 DIAGNOSIS — E782 Mixed hyperlipidemia: Secondary | ICD-10-CM

## 2023-08-08 DIAGNOSIS — E1159 Type 2 diabetes mellitus with other circulatory complications: Secondary | ICD-10-CM

## 2023-08-08 DIAGNOSIS — B351 Tinea unguium: Secondary | ICD-10-CM | POA: Diagnosis not present

## 2023-08-08 DIAGNOSIS — I152 Hypertension secondary to endocrine disorders: Secondary | ICD-10-CM

## 2023-08-08 DIAGNOSIS — E1165 Type 2 diabetes mellitus with hyperglycemia: Secondary | ICD-10-CM | POA: Diagnosis not present

## 2023-08-08 MED ORDER — ATORVASTATIN CALCIUM 40 MG PO TABS: 40.0000 mg | ORAL_TABLET | Freq: Every day | ORAL | 3 refills | Status: AC

## 2023-08-08 NOTE — Progress Notes (Signed)
 [facility] clinic  Provider:  Jereld Serum DNP  Code Status:  Full Code  Goals of Care:     05/08/2023    9:30 AM  Advanced Directives  Does Patient Have a Medical Advance Directive? No  Would patient like information on creating a medical advance directive? No - Patient declined     Chief Complaint  Patient presents with   Medical Management of Chronic Issues    3 month follow up.    Discussed the use of AI scribe software for clinical note transcription with the patient, who gave verbal consent to proceed.   HPI: Patient is a 63 y.o. female seen today for follow up of chronic medical issues.  She is currently on several medications for her chronic conditions, including lisinopril  25 mg daily, atorvastatin  40 mg three times a week, metformin  750 mg once a day, olmesartan  40 mg daily, hydrochlorothiazide  12.5 mg every morning, and vitamin D  50,000 units weekly. She also uses Ozempic  1 mg weekly for diabetes management.  She monitors her blood glucose regularly using a kit with test strips and lancets. Her last recorded A1c was 9.2. She is scheduled for a follow-up blood draw to reassess her A1c and cholesterol levels.  She has a history of thickened toenails with a yellow-brown discoloration, consistent with onychomycosis. She has not previously seen a podiatrist and has been managing her toenail care herself.  She has a history of morbid obesity and has been on Ozempic , resulting in a weight loss of 5 pounds since June. Her weight was 336 pounds in June and is now 331 pounds. She reports difficulty in losing weight beyond a certain point and mentions not consistently weighing her food as advised.  She is a former smoker and does not consume alcohol.     Past Medical History:  Diagnosis Date   Anemia    Arthritis    Back pain    Bilateral calcaneal spurs    Bilateral lower extremity edema    Chest pain    Diabetes (HCC)    Diabetes mellitus without complication (HCC)     type 2   Gallbladder problem    GERD (gastroesophageal reflux disease)    Gout    H/O blood clots    Heartburn    Hyperlipidemia    Hypertension    Obesity    Osteoarthritis    Palpitations    SOBOE (shortness of breath on exertion)    Swallowing difficulty    Type 2 diabetes mellitus (HCC)    Vitamin D  deficiency     Past Surgical History:  Procedure Laterality Date   ABDOMINAL HYSTERECTOMY     partial   CHOLECYSTECTOMY     COLONOSCOPY WITH PROPOFOL  N/A 10/29/2016   Procedure: COLONOSCOPY WITH PROPOFOL ;  Surgeon: Leigh Elspeth SQUIBB, MD;  Location: [facility];  Service: Gastroenterology;  Laterality: N/A;   COLONOSCOPY WITH PROPOFOL  N/A 10/01/2022   Procedure: COLONOSCOPY WITH PROPOFOL ;  Surgeon: Leigh Elspeth SQUIBB, MD;  Location: [facility];  Service: Gastroenterology;  Laterality: N/A;  BMI = 57   DENTAL SURGERY     ORIF TIBIA & FIBULA FRACTURES Left    POLYPECTOMY  10/01/2022   Procedure: POLYPECTOMY;  Surgeon: Leigh Elspeth SQUIBB, MD;  Location: [facility];  Service: Gastroenterology;;    Allergies  Allergen Reactions   Lisinopril  Swelling    Throat swelling   Hydrochlorothiazide      Other Reaction(s): sleepy   Tape Other (See Comments)    Certain Band aids  cause skin irritation    Outpatient Encounter Medications as of 08/08/2023  Medication Sig   atenolol  (TENORMIN ) 50 MG tablet Take 0.5 tablets (25 mg total) by mouth daily.   Blood Glucose Monitoring Suppl (ACCU-CHEK GUIDE) w/Device KIT 1 Device by Does not apply route daily. CBG daily, pls give strips and lancets   chlorthalidone  (HYGROTON ) 25 MG tablet Take 0.5 tablets (12.5 mg total) by mouth every morning.   Cholecalciferol  1.25 MG (50000 UT) capsule Take 1 capsule (50,000 Units total) by mouth once a week.   glucose blood (ACCU-CHEK GUIDE) test strip Use as instructed   metFORMIN  HCl 750 MG TABS Take 750 mg by mouth 2 (two) times daily with a meal.   olmesartan  (BENICAR ) 40 MG tablet Take 1  tablet (40 mg total) by mouth daily.   Semaglutide , 1 MG/DOSE, (OZEMPIC , 1 MG/DOSE,) 4 MG/3ML SOPN Inject 1 mg into the skin once a week.   [DISCONTINUED] atorvastatin  (LIPITOR) 40 MG tablet TAKE 1 TABLET BY MOUTH 3 TIMES A WEEK   atorvastatin  (LIPITOR) 40 MG tablet Take 1 tablet (40 mg total) by mouth daily.   No facility-administered encounter medications on file as of 08/08/2023.    Review of Systems:  Review of Systems  Constitutional:  Negative for appetite change, chills, fatigue and fever.  HENT:  Negative for congestion, hearing loss, rhinorrhea and sore throat.   Eyes: Negative.   Respiratory:  Negative for cough, shortness of breath and wheezing.   Cardiovascular:  Negative for chest pain, palpitations and leg swelling.  Gastrointestinal:  Negative for abdominal pain, constipation, diarrhea, nausea and vomiting.  Genitourinary:  Negative for dysuria.  Musculoskeletal:  Negative for arthralgias, back pain and myalgias.  Skin:  Negative for color change, rash and wound.  Neurological:  Negative for dizziness, weakness and headaches.  Psychiatric/Behavioral:  Negative for behavioral problems. The patient is not nervous/anxious.     Health Maintenance  Topic Date Due   Hepatitis B Vaccines (2 of 3 - 19+ 3-dose series) 03/17/2015   COVID-19 Vaccine (4 - 2024-25 season) 10/13/2022   OPHTHALMOLOGY EXAM  07/17/2023   Zoster Vaccines- Shingrix (1 of 2) 08/08/2023 (Originally 10/15/1979)   INFLUENZA VACCINE  09/12/2023   HEMOGLOBIN A1C  11/08/2023   Diabetic kidney evaluation - eGFR measurement  05/07/2024   Diabetic kidney evaluation - Urine ACR  05/07/2024   MAMMOGRAM  08/04/2024   FOOT EXAM  08/07/2024   Cervical Cancer Screening (HPV/Pap Cotest)  04/22/2028   DTaP/Tdap/Td (2 - Td or Tdap) 10/25/2028   Colonoscopy  09/30/2032   Pneumococcal Vaccine 40-62 Years old  Completed   Hepatitis C Screening  Completed   HIV Screening  Completed   HPV VACCINES  Aged Out    Meningococcal B Vaccine  Aged Out    Physical Exam: Vitals:   08/08/23 1355  BP: 136/86  Pulse: 88  Temp: 97.8 F (36.6 C)  SpO2: 96%  Weight: (!) 331 lb (150.1 kg)  Height: 5' 5 (1.651 m)   Body mass index is 55.08 kg/m. Physical Exam Constitutional:      General: She is not in acute distress.    Appearance: She is obese.  HENT:     Head: Normocephalic and atraumatic.     Nose: Nose normal.     Mouth/Throat:     Mouth: Mucous membranes are moist.   Eyes:     Conjunctiva/sclera: Conjunctivae normal.    Cardiovascular:     Rate and Rhythm: Normal rate and regular  rhythm.  Pulmonary:     Effort: Pulmonary effort is normal.     Breath sounds: Normal breath sounds.  Abdominal:     General: Bowel sounds are normal.     Palpations: Abdomen is soft.   Musculoskeletal:        General: Normal range of motion.     Cervical back: Normal range of motion.   Skin:    General: Skin is warm and dry.     Comments: Bilateral toenails thick brownish and yellowish   Neurological:     General: No focal deficit present.     Mental Status: She is alert and oriented to person, place, and time.   Psychiatric:        Mood and Affect: Mood normal.        Behavior: Behavior normal.        Thought Content: Thought content normal.        Judgment: Judgment normal.     Labs reviewed: Basic Metabolic Panel: Recent Labs    01/24/23 0857 05/08/23 1007  NA 140 140  K 4.0 4.2  CL 103 103  CO2 30 27  GLUCOSE 227* 230*  BUN 16 17  CREATININE 0.80 0.75  CALCIUM  9.9 9.6   Liver Function Tests: Recent Labs    01/24/23 0857  AST 15  ALT 19  BILITOT 0.3  PROT 6.6   No results for input(s): LIPASE, AMYLASE in the last 8760 hours. No results for input(s): AMMONIA in the last 8760 hours. CBC: Recent Labs    01/24/23 0857 05/08/23 1007  WBC 7.0 7.2  NEUTROABS 4,438 4,615  HGB 12.2 11.9  HCT 38.7 36.3  MCV 94.4 89.9  PLT 250 235   Lipid Panel: Recent Labs     01/24/23 0857 05/08/23 1007  CHOL 145 152  HDL 47* 49*  LDLCALC 82 84  TRIG 80 93  CHOLHDL 3.1 3.1   Lab Results  Component Value Date   HGBA1C 9.2 (H) 05/08/2023    Procedures since last visit: No results found.  Assessment/Plan  1. Type 2 diabetes mellitus with hyperglycemia, unspecified whether long term insulin  use (HCC) (Primary) -  A1c of 9.2 indicates poor glycemic control. Discrepancy in reported A1c values. - Schedule blood draw for A1c and cholesterol levels. - Continue metformin  750 mg once daily. - Continue Ozempic  1 mg weekly. - Hemoglobin A1C; Future - Microalbumin/Creatinine Ratio, Urine; Future  2. Hypertension associated with diabetes (HCC) -  Well-controlled with current regimen. - Continue atenolol  25 mg daily. - Continue olmesartan  40 mg daily. - Continue chlorthalidone  12.5 mg daily. - Complete Metabolic Panel with eGFR; Future  3. Morbidly obese (HCC) -  Current weight 331 lbs, 5 lbs loss since last visit. On Ozempic  for weight management. - Continue Ozempic  1 mg weekly. - Encourage dietary modifications, including portion control and increased vegetable intake. - Encourage increased water intake.  4. Onychomycosis -  Thickened, yellow-brown toenails suggest fungal infection. Referral to podiatrist discussed. - Refer to podiatrist for toenail trimming and management of onychomycosis. - Ambulatory referral to Podiatry  5. Mixed hyperlipidemia -  LDL at 82 mg/dL, above target. Discussed increasing atorvastatin  to reduce cardiovascular risk. - Increase atorvastatin  to 40 mg daily from three times a week. - Lipid panel; Future - atorvastatin  (LIPITOR) 40 MG tablet; Take 1 tablet (40 mg total) by mouth daily.  Dispense: 90 tablet; Refill: 3    General Health Maintenance Former smoker, regular eye exams. Encouraged exercise to improve HDL. -  Sign consent for release of eye exam records from [facility]. - Encourage regular exercise to improve  HDL levels.  Follow-up Plans for lab work and next appointment discussed. - Schedule lab appointment for A1c and cholesterol levels, ensuring fasting before the test. - Follow up in three months for routine evaluation.      Labs/tests ordered: CBC, CMP, urine microalbumin creatinine ratio, A1c  Return in about 3 months (around 11/08/2023).  Ruth Granieri Medina-Vargas, NP

## 2023-08-11 ENCOUNTER — Other Ambulatory Visit

## 2023-08-11 DIAGNOSIS — E1165 Type 2 diabetes mellitus with hyperglycemia: Secondary | ICD-10-CM

## 2023-08-11 DIAGNOSIS — E782 Mixed hyperlipidemia: Secondary | ICD-10-CM

## 2023-08-11 DIAGNOSIS — I152 Hypertension secondary to endocrine disorders: Secondary | ICD-10-CM

## 2023-08-12 ENCOUNTER — Other Ambulatory Visit: Payer: Self-pay | Admitting: Adult Health

## 2023-08-12 ENCOUNTER — Ambulatory Visit: Payer: Self-pay | Admitting: Adult Health

## 2023-08-12 DIAGNOSIS — E1165 Type 2 diabetes mellitus with hyperglycemia: Secondary | ICD-10-CM

## 2023-08-12 LAB — MICROALBUMIN / CREATININE URINE RATIO
Creatinine, Urine: 147 mg/dL (ref 20–275)
Microalb Creat Ratio: 20 mg/g{creat} (ref <30)
Microalb, Ur: 3 mg/dL

## 2023-08-12 LAB — HEMOGLOBIN A1C
Hgb A1c MFr Bld: 9.2 % — ABNORMAL HIGH (ref <5.7)
Mean Plasma Glucose: 217 mg/dL
eAG (mmol/L): 12 mmol/L

## 2023-08-12 LAB — COMPREHENSIVE METABOLIC PANEL WITH GFR
AG Ratio: 1.3 (calc) (ref 1.0–2.5)
ALT: 21 U/L (ref 6–29)
AST: 15 U/L (ref 10–35)
Albumin: 3.8 g/dL (ref 3.6–5.1)
Alkaline phosphatase (APISO): 77 U/L (ref 37–153)
BUN: 17 mg/dL (ref 7–25)
CO2: 27 mmol/L (ref 20–32)
Calcium: 9.6 mg/dL (ref 8.6–10.4)
Chloride: 104 mmol/L (ref 98–110)
Creat: 0.83 mg/dL (ref 0.50–1.05)
Globulin: 3 g/dL (ref 1.9–3.7)
Glucose, Bld: 226 mg/dL — ABNORMAL HIGH (ref 65–99)
Potassium: 4.2 mmol/L (ref 3.5–5.3)
Sodium: 139 mmol/L (ref 135–146)
Total Bilirubin: 0.4 mg/dL (ref 0.2–1.2)
Total Protein: 6.8 g/dL (ref 6.1–8.1)
eGFR: 80 mL/min/{1.73_m2} (ref >=60)

## 2023-08-12 LAB — LIPID PANEL
Cholesterol: 165 mg/dL (ref <200)
HDL: 52 mg/dL (ref >=50)
LDL Cholesterol (Calc): 96 mg/dL
Non-HDL Cholesterol (Calc): 113 mg/dL (ref <130)
Total CHOL/HDL Ratio: 3.2 (calc) (ref <5.0)
Triglycerides: 82 mg/dL (ref <150)

## 2023-08-12 MED ORDER — METFORMIN HCL 1000 MG PO TABS: 1000.0000 mg | ORAL_TABLET | Freq: Two times a day (BID) | ORAL | 1 refills | Status: DC

## 2023-08-12 NOTE — Progress Notes (Signed)
-     Electrolytes, lipid panel, liver enzymes, urine microalbumin creatinine ratio, all normal -A1c 9.2, same as previous, will increase metformin  from 750 twice a day to metformin  1000 mg twice a day, [facility]

## 2023-08-12 NOTE — Progress Notes (Signed)
 Noted

## 2023-08-19 ENCOUNTER — Encounter (INDEPENDENT_AMBULATORY_CARE_PROVIDER_SITE_OTHER): Payer: Self-pay | Admitting: Family Medicine

## 2023-08-19 ENCOUNTER — Ambulatory Visit (INDEPENDENT_AMBULATORY_CARE_PROVIDER_SITE_OTHER): Admitting: Family Medicine

## 2023-08-19 VITALS — BP 118/80 | HR 89 | Temp 98.2°F | Ht 65.0 in | Wt 324.0 lb

## 2023-08-19 DIAGNOSIS — E1169 Type 2 diabetes mellitus with other specified complication: Secondary | ICD-10-CM

## 2023-08-19 DIAGNOSIS — E669 Obesity, unspecified: Secondary | ICD-10-CM

## 2023-08-19 DIAGNOSIS — E785 Hyperlipidemia, unspecified: Secondary | ICD-10-CM | POA: Diagnosis not present

## 2023-08-19 DIAGNOSIS — Z7985 Long-term (current) use of injectable non-insulin antidiabetic drugs: Secondary | ICD-10-CM

## 2023-08-19 DIAGNOSIS — E1165 Type 2 diabetes mellitus with hyperglycemia: Secondary | ICD-10-CM

## 2023-08-19 DIAGNOSIS — Z6841 Body Mass Index (BMI) 40.0 and over, adult: Secondary | ICD-10-CM

## 2023-08-19 MED ORDER — TIRZEPATIDE 7.5 MG/0.5ML ~~LOC~~ SOAJ
7.5000 mg | SUBCUTANEOUS | 0 refills | Status: DC
Start: 2023-08-19 — End: 2023-08-28

## 2023-08-19 NOTE — Progress Notes (Signed)
 SUBJECTIVE:  Chief Complaint: Obesity  Interim History: Patient mentions she has doing pretty much the same.  She was increased up to 1000mg  daily on her metformin .  Not sure if she can tolerate that increase.  Recent A1c of 9.2.  She does have to break up the meals on Category 3 but she gets full and then has to get back to it.  She is eating at least 2 meals a day.  She is still getting snacks in and craving sweets.  Next few weeks she isn't going out of town or anything.  No events or activities.  No significant side effects of Ozempic  except nausea when she lays on her left side.   Ruth [name] is here to discuss her progress with her obesity treatment plan. She is on the Category 3 Plan and states she is following her eating plan approximately 70 % of the time. She states she is not exercising, but is going up and down the steps 5 times per day.   OBJECTIVE: Visit Diagnoses: Problem List Items Addressed This Visit       Endocrine   DM (diabetes mellitus) (HCC) - Primary   Relevant Medications   tirzepatide  (MOUNJARO ) 7.5 MG/0.5ML Pen   Hyperlipidemia associated with type 2 diabetes mellitus (HCC)   Recent labs showing LDL still above goal.  She is taking atorvastatin  3x a week.  The 10-year ASCVD risk score (Arnett DK, et al., 2019) is: 12.3%   Values used to calculate the score:     Age: 63 years     Clincally relevant sex: Female     Is Non-Hispanic African American: Yes     Diabetic: Yes     Tobacco smoker: No     Systolic Blood Pressure: 118 mmHg     Is BP treated: Yes     HDL Cholesterol: 52 mg/dL     Total Cholesterol: 165 mg/dL   She was encouraged to increase atorvastatin  to daily by PCP.  I reiterated importance in starting this so we can retest in 3 months to see if it impacts her cholesterol numbers.  Will follow up at next appointment.      Relevant Medications   tirzepatide  (MOUNJARO ) 7.5 MG/0.5ML Pen   Type 2 diabetes mellitus with hyperglycemia (HCC)   Recent  A1c still elevated at 9.2.  There has been no change with ozempic  and metformin  in her A1c.  Will stop Ozempic  and start Mounjaro  at 7.5mg  to see if that can increase blood sugar control and allow for weight loss.      Relevant Medications   tirzepatide  (MOUNJARO ) 7.5 MG/0.5ML Pen     Other   BMI 50.0-59.9, adult (HCC)   Relevant Medications   tirzepatide  (MOUNJARO ) 7.5 MG/0.5ML Pen   Obesity with starting BMI of 57.5   Relevant Medications   tirzepatide  (MOUNJARO ) 7.5 MG/0.5ML Pen    Vitals Temp: 98.2 F (36.8 C) BP: 118/80 Pulse Rate: 89 SpO2: 98 %   Anthropometric Measurements Height: 5' 5 (1.651 m) Weight: (!) 324 lb (147 kg) BMI (Calculated): 53.92 Weight at Last Visit: 326 lb Weight Lost Since Last Visit: 2 Weight Gained Since Last Visit: 0 Starting Weight: 335 lb Total Weight Loss (lbs): 11 lb (4.99 kg)   Body Composition  Body Fat %: 54.3 % Fat Mass (lbs): 176.2 lbs Muscle Mass (lbs): 141 lbs Total Body Water (lbs): 99.8 lbs Visceral Fat Rating : 23   Other Clinical Data Today's Visit #: 13 Starting Date: 07/30/22  Comments: Cat 3     ASSESSMENT AND PLAN:  Diet: Ruth [name] is currently in the action stage of change. As such, her goal is to continue with weight loss efforts and has agreed to the Category 3 Plan and keeping a food journal and adhering to recommended goals of 350-450 calories and 30 or more grams of protein.   Exercise:  All adults should avoid inactivity. Some activity is better than none, and adults who participate in any amount of physical activity, gain some health benefits.  Behavior Modification:  We discussed the following Behavioral Modification Strategies today: increasing lean protein intake, decreasing simple carbohydrates, no skipping meals, meal planning and cooking strategies, and avoiding temptations.   Return in about 4 weeks (around 09/16/2023).   She was informed of the importance of frequent follow up visits to maximize  her success with intensive lifestyle modifications for her multiple health conditions.  Attestation Statements:   Reviewed by clinician on day of visit: allergies, medications, problem list, medical history, surgical history, family history, social history, and previous encounter notes.    Ruth Cho, MD

## 2023-08-19 NOTE — Assessment & Plan Note (Signed)
 Recent A1c still elevated at 9.2.  There has been no change with ozempic  and metformin  in her A1c.  Will stop Ozempic  and start Mounjaro  at 7.5mg  to see if that can increase blood sugar control and allow for weight loss.

## 2023-08-19 NOTE — Assessment & Plan Note (Signed)
 Recent labs showing LDL still above goal.  She is taking atorvastatin  3x a week.  The 10-year ASCVD risk score (Arnett DK, et al., 2019) is: 12.3%   Values used to calculate the score:     Age: 63 years     Clincally relevant sex: Female     Is Non-Hispanic African American: Yes     Diabetic: Yes     Tobacco smoker: No     Systolic Blood Pressure: 118 mmHg     Is BP treated: Yes     HDL Cholesterol: 52 mg/dL     Total Cholesterol: 165 mg/dL   She was encouraged to increase atorvastatin  to daily by PCP.  I reiterated importance in starting this so we can retest in 3 months to see if it impacts her cholesterol numbers.  Will follow up at next appointment.

## 2023-08-20 ENCOUNTER — Telehealth (INDEPENDENT_AMBULATORY_CARE_PROVIDER_SITE_OTHER): Payer: Self-pay

## 2023-08-20 NOTE — Telephone Encounter (Signed)
 Approval for Mounjaro   [employer] [facility] [place]  has reviewed the request for Mounjaro  Inj 7.5/0.5 submitted by Ruth [name] on behalf of Ruth [name] on 08/20/2023. After review, the request for service is: Approved through 08/19/2024.

## 2023-08-21 ENCOUNTER — Ambulatory Visit: Admitting: Adult Health

## 2023-08-21 ENCOUNTER — Ambulatory Visit (INDEPENDENT_AMBULATORY_CARE_PROVIDER_SITE_OTHER): Admitting: Family Medicine

## 2023-08-28 ENCOUNTER — Encounter: Payer: Self-pay | Admitting: Podiatry

## 2023-08-28 ENCOUNTER — Ambulatory Visit: Admitting: Podiatry

## 2023-08-28 DIAGNOSIS — E1142 Type 2 diabetes mellitus with diabetic polyneuropathy: Secondary | ICD-10-CM

## 2023-08-28 DIAGNOSIS — M79676 Pain in unspecified toe(s): Secondary | ICD-10-CM

## 2023-08-28 DIAGNOSIS — B351 Tinea unguium: Secondary | ICD-10-CM | POA: Diagnosis not present

## 2023-08-31 NOTE — Progress Notes (Signed)
 Subjective:  Patient ID: Ruth [name], female    DOB: 08-11-1960,  MRN: [id number] HPI Chief Complaint  Patient presents with   Debridement    Requesting toenail trim-concerned about thickness and discoloration   New Patient (Initial Visit)    Est pt 2021    63 y.o. female presents with the above complaint.   ROS: Denies fever chills nausea muscle aches pains calf pain back pain chest pain shortness of breath  Past Medical History:  Diagnosis Date   Anemia    Arthritis    Back pain    Bilateral calcaneal spurs    Bilateral lower extremity edema    Chest pain    Diabetes (HCC)    Diabetes mellitus without complication (HCC)    type 2   Gallbladder problem    GERD (gastroesophageal reflux disease)    Gout    H/O blood clots    Heartburn    Hyperlipidemia    Hypertension    Obesity    Osteoarthritis    Palpitations    SOBOE (shortness of breath on exertion)    Swallowing difficulty    Type 2 diabetes mellitus (HCC)    Vitamin D  deficiency    Past Surgical History:  Procedure Laterality Date   ABDOMINAL HYSTERECTOMY     partial   CHOLECYSTECTOMY     COLONOSCOPY WITH PROPOFOL  N/A 10/29/2016   Procedure: COLONOSCOPY WITH PROPOFOL ;  Surgeon: Leigh Elspeth SQUIBB, MD;  Location: [facility];  Service: Gastroenterology;  Laterality: N/A;   COLONOSCOPY WITH PROPOFOL  N/A 10/01/2022   Procedure: COLONOSCOPY WITH PROPOFOL ;  Surgeon: Leigh Elspeth SQUIBB, MD;  Location: [facility];  Service: Gastroenterology;  Laterality: N/A;  BMI = 57   DENTAL SURGERY     ORIF TIBIA & FIBULA FRACTURES Left    POLYPECTOMY  10/01/2022   Procedure: POLYPECTOMY;  Surgeon: Leigh Elspeth SQUIBB, MD;  Location: [facility];  Service: Gastroenterology;;    Current Outpatient Medications:    atenolol  (TENORMIN ) 50 MG tablet, Take 0.5 tablets (25 mg total) by mouth daily., Disp: , Rfl:    atorvastatin  (LIPITOR) 40 MG tablet, Take 1 tablet (40 mg total) by mouth daily., Disp: 90 tablet, Rfl:  3   Blood Glucose Monitoring Suppl (ACCU-CHEK GUIDE) w/Device KIT, 1 Device by Does not apply route daily. CBG daily, pls give strips and lancets, Disp: 1 kit, Rfl: 0   chlorthalidone  (HYGROTON ) 25 MG tablet, Take 0.5 tablets (12.5 mg total) by mouth every morning., Disp: 45 tablet, Rfl: 2   Cholecalciferol  1.25 MG (50000 UT) capsule, Take 1 capsule (50,000 Units total) by mouth once a week., Disp: 4 capsule, Rfl: 3   glucose blood (ACCU-CHEK GUIDE) test strip, Use as instructed, Disp: 100 each, Rfl: 12   metFORMIN  (GLUCOPHAGE ) 1000 MG tablet, Take 1 tablet (1,000 mg total) by mouth 2 (two) times daily with a meal. (Patient taking differently: Take 1,000 mg by mouth 2 (two) times daily with a meal. Taking 750mg  every day -08/19/23), Disp: 180 tablet, Rfl: 1   olmesartan  (BENICAR ) 40 MG tablet, Take 1 tablet (40 mg total) by mouth daily., Disp: 90 tablet, Rfl: 1  Allergies  Allergen Reactions   Lisinopril  Swelling    Throat swelling   Hydrochlorothiazide      Other Reaction(s): sleepy   Tape Other (See Comments)    Certain Band aids cause skin irritation   Review of Systems Objective:  There were no vitals filed for this visit.  General: Well developed, nourished, in no acute  distress, alert and oriented x3   Dermatological: Skin is warm, dry and supple bilateral. Nails x 10 are well maintained hallux nails bilaterally do demonstrate nail dystrophy cannot rule out onychomycosis; remaining integument appears unremarkable at this time. There are no open sores, no preulcerative lesions, no rash or signs of infection present.  Vascular: Dorsalis Pedis artery and Posterior Tibial artery pedal pulses are 2/4 bilateral with immedate capillary fill time. Pedal hair growth present. No varicosities and no lower extremity edema present bilateral.   Neruologic: Grossly intact via light touch bilateral. Vibratory intact via tuning fork bilateral. Protective threshold with Semmes Wienstein monofilament  intact to all pedal sites bilateral. Patellar and Achilles deep tendon reflexes 2+ bilateral. No Babinski or clonus noted bilateral.   Musculoskeletal: No gross boney pedal deformities bilateral. No pain, crepitus, or limitation noted with foot and ankle range of motion bilateral. Muscular strength 5/5 in all groups tested bilateral.  Gait: Unassisted, Nonantalgic.    Radiographs:  None taken  Assessment & Plan:   Assessment: Diabetes without diabetic complications bilateral lower extremity.  Pain limb secondary to onychomycosis nail dystrophy.  Plan: Debridement of toenails.     Ruth [name] [place], [place]

## 2023-09-03 ENCOUNTER — Other Ambulatory Visit (INDEPENDENT_AMBULATORY_CARE_PROVIDER_SITE_OTHER): Payer: Self-pay | Admitting: Family Medicine

## 2023-09-03 DIAGNOSIS — E1165 Type 2 diabetes mellitus with hyperglycemia: Secondary | ICD-10-CM

## 2023-09-17 ENCOUNTER — Encounter (INDEPENDENT_AMBULATORY_CARE_PROVIDER_SITE_OTHER): Payer: Self-pay | Admitting: Family Medicine

## 2023-09-17 ENCOUNTER — Ambulatory Visit (INDEPENDENT_AMBULATORY_CARE_PROVIDER_SITE_OTHER): Admitting: Family Medicine

## 2023-09-17 VITALS — BP 104/66 | HR 78 | Temp 97.9°F | Ht 65.0 in | Wt 322.0 lb

## 2023-09-17 DIAGNOSIS — Z7985 Long-term (current) use of injectable non-insulin antidiabetic drugs: Secondary | ICD-10-CM

## 2023-09-17 DIAGNOSIS — E559 Vitamin D deficiency, unspecified: Secondary | ICD-10-CM

## 2023-09-17 DIAGNOSIS — E669 Obesity, unspecified: Secondary | ICD-10-CM

## 2023-09-17 DIAGNOSIS — E1165 Type 2 diabetes mellitus with hyperglycemia: Secondary | ICD-10-CM | POA: Diagnosis not present

## 2023-09-17 DIAGNOSIS — Z6841 Body Mass Index (BMI) 40.0 and over, adult: Secondary | ICD-10-CM

## 2023-09-17 MED ORDER — CHOLECALCIFEROL 1.25 MG (50000 UT) PO CAPS: 50000.0000 [IU] | ORAL_CAPSULE | ORAL | 0 refills | Status: DC

## 2023-09-17 MED ORDER — TIRZEPATIDE 7.5 MG/0.5ML ~~LOC~~ SOAJ: 7.5000 mg | SUBCUTANEOUS | 0 refills | Status: DC

## 2023-09-17 NOTE — Progress Notes (Signed)
 SUBJECTIVE:  Chief Complaint: Obesity  Interim History: Patient has been trying to stay cool since last appointment and she moved.  She is all moved now but still has her storage unit. She has been eating pretty well- following the meal plan 65% but isn't sure if she is getting enough protein in.  She is getting in a decent amount of protein and limiting her vegetable and getting in more fruit.  She has to find her food scale again. She has a family reunion coming up and a baby shower upcoming as well.  [place]   Richie is here to discuss her progress with her obesity treatment plan. She is on the Category 3 Plan and states she is following her eating plan approximately 65 % of the time. She states she is walking some.   OBJECTIVE: Visit Diagnoses: Problem List Items Addressed This Visit       Endocrine   DM (diabetes mellitus) (HCC) - Primary   Relevant Medications   tirzepatide  (MOUNJARO ) 7.5 MG/0.5ML Pen     Other   BMI 50.0-59.9, adult (HCC)   Relevant Medications   tirzepatide  (MOUNJARO ) 7.5 MG/0.5ML Pen   Vitamin D  deficiency   Relevant Medications   Cholecalciferol  1.25 MG (50000 UT) capsule   Obesity with starting BMI of 57.5   Relevant Medications   tirzepatide  (MOUNJARO ) 7.5 MG/0.5ML Pen    Vitals Temp: 97.9 F (36.6 C) BP: 104/66 Pulse Rate: 78 SpO2: 100 %   Anthropometric Measurements Height: 5' 5 (1.651 m) Weight: (!) 322 lb (146.1 kg) BMI (Calculated): 53.58 Weight at Last Visit: 324 lb Weight Lost Since Last Visit: 2 Weight Gained Since Last Visit: 0 Starting Weight: 335 lb Total Weight Loss (lbs): 13 lb (5.897 kg)   Body Composition  Body Fat %: 56 % Fat Mass (lbs): 180.6 lbs Muscle Mass (lbs): 134.8 lbs Total Body Water (lbs): 102.6 lbs Visceral Fat Rating : 23   Other Clinical Data Today's Visit #: 14 Starting Date: 07/30/22 Comments: Cat 3     ASSESSMENT AND PLAN: Assessment & Plan Vitamin D   deficiency On prescription strength Vitamin D .  No nausea, vomiting or muscle weakness.  Needs a refill today.  Continue current treatment. Type 2 diabetes mellitus with hyperglycemia, without long-term current use of insulin  (HCC) Tolerating Mounjaro  with minimal GI side effects.  She needs a refill today.  Experiencing satiety and decreased intake but unclear if from Mounjaro  or just patient's norm.  Refill current dose until intake increases. Obesity with starting BMI of 57.6  BMI 50.0-59.9, adult (HCC)    Diet: Sharnae is currently in the action stage of change. As such, her goal is to continue with weight loss efforts and has agreed to the Category 3 Plan.   Exercise:  For substantial health benefits, adults should do at least 150 minutes (2 hours and 30 minutes) a week of moderate-intensity, or 75 minutes (1 hour and 15 minutes) a week of vigorous-intensity aerobic physical activity, or an equivalent combination of moderate- and vigorous-intensity aerobic activity. Aerobic activity should be performed in episodes of at least 10 minutes, and preferably, it should be spread throughout the week.  Behavior Modification:  We discussed the following Behavioral Modification Strategies today: increasing lean protein intake, decreasing simple carbohydrates, increasing vegetables, meal planning and cooking strategies, keeping healthy foods in the home, and planning for success.   Return in about 4 weeks (around 10/15/2023).   She was informed of the  importance of frequent follow up visits to maximize her success with intensive lifestyle modifications for her multiple health conditions.  Attestation Statements:   Reviewed by clinician on day of visit: allergies, medications, problem list, medical history, surgical history, family history, social history, and previous encounter notes.     Adelita Cho, MD

## 2023-09-28 NOTE — Assessment & Plan Note (Signed)
 Tolerating Mounjaro  with minimal GI side effects.  She needs a refill today.  Experiencing satiety and decreased intake but unclear if from Mounjaro  or just patient's norm.  Refill current dose until intake increases.

## 2023-09-28 NOTE — Assessment & Plan Note (Signed)
 On prescription strength Vitamin D .  No nausea, vomiting or muscle weakness.  Needs a refill today.  Continue current treatment.

## 2023-10-20 ENCOUNTER — Encounter (INDEPENDENT_AMBULATORY_CARE_PROVIDER_SITE_OTHER): Payer: Self-pay | Admitting: Family Medicine

## 2023-10-20 ENCOUNTER — Ambulatory Visit (INDEPENDENT_AMBULATORY_CARE_PROVIDER_SITE_OTHER): Admitting: Family Medicine

## 2023-10-20 VITALS — BP 111/64 | HR 82 | Temp 98.0°F | Ht 65.0 in | Wt 317.0 lb

## 2023-10-20 DIAGNOSIS — Z6841 Body Mass Index (BMI) 40.0 and over, adult: Secondary | ICD-10-CM

## 2023-10-20 DIAGNOSIS — E1165 Type 2 diabetes mellitus with hyperglycemia: Secondary | ICD-10-CM | POA: Diagnosis not present

## 2023-10-20 DIAGNOSIS — E669 Obesity, unspecified: Secondary | ICD-10-CM

## 2023-10-20 DIAGNOSIS — E1159 Type 2 diabetes mellitus with other circulatory complications: Secondary | ICD-10-CM | POA: Diagnosis not present

## 2023-10-20 DIAGNOSIS — Z7985 Long-term (current) use of injectable non-insulin antidiabetic drugs: Secondary | ICD-10-CM

## 2023-10-20 DIAGNOSIS — I152 Hypertension secondary to endocrine disorders: Secondary | ICD-10-CM | POA: Diagnosis not present

## 2023-10-20 MED ORDER — TIRZEPATIDE 7.5 MG/0.5ML ~~LOC~~ SOAJ: 7.5000 mg | SUBCUTANEOUS | 0 refills | Status: DC

## 2023-10-20 MED ORDER — CHLORTHALIDONE 25 MG PO TABS: 12.5000 mg | ORAL_TABLET | Freq: Every morning | ORAL | 2 refills | Status: DC

## 2023-10-20 MED ORDER — CHLORTHALIDONE 25 MG PO TABS
12.5000 mg | ORAL_TABLET | Freq: Every morning | ORAL | 2 refills | Status: AC
Start: 2023-10-20 — End: ?

## 2023-10-20 NOTE — Progress Notes (Unsigned)
 SUBJECTIVE:  Chief Complaint: Obesity  Interim History: Patient went to [facility] for her birthday.  She had a small family gathering. Besides her birthday she is doing some weighing on her food scale.  She is trying to eat more but thinks there is still room for improvement in terms of quantity.  This month she does not have anything planned except for a few programs at the church she is involved in.  She has been trying to walk a bit more.  She is walking and trying to get about a mile in.  She recognizes she needs to cut back on fried food over the next few weeks and increasing her baked protein.  Breakfast is grits and a couple of pieces of bacon.    Ruth [name] is here to discuss her progress with her obesity treatment plan. She is on the Category 3 Plan and states she is following her eating plan approximately 70 % of the time. She states she is walking 1 mile 3-4 times per week.   OBJECTIVE: Visit Diagnoses: Problem List Items Addressed This Visit       Cardiovascular and Mediastinum   Hypertension associated with diabetes (HCC)   Relevant Medications   chlorthalidone  (HYGROTON ) 25 MG tablet   tirzepatide  (MOUNJARO ) 7.5 MG/0.5ML Pen     Endocrine   DM (diabetes mellitus) (HCC)   Relevant Medications   tirzepatide  (MOUNJARO ) 7.5 MG/0.5ML Pen    Vitals Temp: 98 F (36.7 C) BP: 111/64 Pulse Rate: 82 SpO2: 99 %   Anthropometric Measurements Height: 5' 5 (1.651 m) Weight: (!) 317 lb (143.8 kg) BMI (Calculated): 52.75 Weight at Last Visit: 322 lb Weight Lost Since Last Visit: 5 lb Starting Weight: 335 lb Total Weight Loss (lbs): 18 lb (8.165 kg)   Body Composition  Body Fat %: 55.4 % Fat Mass (lbs): 175.8 lbs Muscle Mass (lbs): 134.2 lbs Total Body Water (lbs): 101.6 lbs Visceral Fat Rating : 23   Other Clinical Data Today's Visit #: 15 Starting Date: 07/30/22 Comments: Cat 3     ASSESSMENT AND PLAN: Assessment & Plan Type 2 diabetes mellitus  with hyperglycemia, without long-term current use of insulin  (HCC) Tolerating current dose of Mounjaro  7.5mg  weekly without any GI side effects.  No change in treatment at this time as patient is having control and still working on getting total nutrition and calorie goal in daily. Hypertension associated with diabetes (HCC) Blood pressure well controlled today.  No chest pain, chest pressure or headache.  No dizziness or lightheadedness.  Needs a refill of chlorthalidone  today.  No change in current dose.  If BP continues to stay well controlled will ned to re-evaluate med dosages Obesity with starting BMI of 57.6  BMI 50.0-59.9, adult (HCC)    Diet: Ameliah is currently in the action stage of change. As such, her goal is to continue with weight loss efforts and has agreed to the Category 3 Plan.   Exercise:  For substantial health benefits, adults should do at least 150 minutes (2 hours and 30 minutes) a week of moderate-intensity, or 75 minutes (1 hour and 15 minutes) a week of vigorous-intensity aerobic physical activity, or an equivalent combination of moderate- and vigorous-intensity aerobic activity. Aerobic activity should be performed in episodes of at least 10 minutes, and preferably, it should be spread throughout the week.  Behavior Modification:  We discussed the following Behavioral Modification Strategies today: increasing lean protein intake, decreasing simple carbohydrates, increasing vegetables, meal planning and cooking  strategies, keeping healthy foods in the home, and planning for success.   No follow-ups on file.   She was informed of the importance of frequent follow up visits to maximize her success with intensive lifestyle modifications for her multiple health conditions.  Attestation Statements:   Reviewed by clinician on day of visit: allergies, medications, problem list, medical history, surgical history, family history, social history, and previous encounter  notes.     Adelita Cho, MD

## 2023-10-21 NOTE — Assessment & Plan Note (Signed)
 Blood pressure well controlled today.  No chest pain, chest pressure or headache.  No dizziness or lightheadedness.  Needs a refill of chlorthalidone  today.  No change in current dose.  If BP continues to stay well controlled will ned to re-evaluate med dosages

## 2023-10-21 NOTE — Assessment & Plan Note (Signed)
 Tolerating current dose of Mounjaro  7.5mg  weekly without any GI side effects.  No change in treatment at this time as patient is having control and still working on getting total nutrition and calorie goal in daily.

## 2023-11-10 ENCOUNTER — Ambulatory Visit: Admitting: Adult Health

## 2023-11-12 NOTE — Patient Instructions (Incomplete)
 1) Please visit your local pharmacy to receive your shingles, covid, and hepatitis B  vaccine, if you have not already received.

## 2023-11-13 ENCOUNTER — Ambulatory Visit: Admitting: Adult Health

## 2023-11-24 ENCOUNTER — Ambulatory Visit: Payer: Self-pay

## 2023-11-24 NOTE — Telephone Encounter (Signed)
 Noted

## 2023-11-24 NOTE — Telephone Encounter (Signed)
 Message routed to PCP Medina-Vargas, Monina C, NP as FYI.

## 2023-11-24 NOTE — Telephone Encounter (Signed)
 FYI Only or Action Required?: FYI only for provider.  Patient was last seen in primary care on 10/20/2023 by [place] Adelita PENNER, MD.  Called Nurse Triage reporting Abdominal Pain.  Symptoms began about a month ago.  Interventions attempted: Rest, hydration, or home remedies.  Symptoms are: gradually worsening.  Triage Disposition: See PCP Within 2 Weeks  Patient/caregiver understands and will follow disposition?: Yes          Copied from CRM [id number]. Topic: Clinical - Red Word Triage >> Nov 24, 2023 10:12 AM Chiquita SQUIBB wrote: Red Word that prompted transfer to Nurse Triage: Patient is having pain in her lower right side of her abdomen, patient states it is hard to walk due to the pain. Reason for Disposition  Abdominal pain is a chronic symptom (recurrent or ongoing AND present > 4 weeks)  Answer Assessment - Initial Assessment Questions 1. LOCATION: Where does it hurt?      R lower side 2. RADIATION: Does the pain shoot anywhere else? (e.g., chest, back)     denies 3. ONSET: When did the pain begin? (e.g., minutes, hours or days ago)      X 1 month 4. SUDDEN: Gradual or sudden onset?     Gradual.  5. PATTERN Does the pain come and go, or is it constant?     Comes and goes 6. SEVERITY: How bad is the pain?  (e.g., Scale 1-10; mild, moderate, or severe)     Currently 6/10 7. RECURRENT SYMPTOM: Have you ever had this type of stomach pain before? If Yes, ask: When was the last time? and What happened that time?     Denies.  8. CAUSE: What do you think is causing the stomach pain? (e.g., gallstones, recent abdominal surgery)     Unknown 9. RELIEVING/AGGRAVATING FACTORS: What makes it better or worse? (e.g., antacids, bending or twisting motion, bowel movement)     Movement/walking.  10. OTHER SYMPTOMS: Do you have any other symptoms? (e.g., back pain, diarrhea, fever, urination pain, vomiting)        denies 11. PREGNANCY: Is there any chance you  are pregnant? When was your last menstrual period?       N/a  Protocols used: Abdominal Pain - Female-A-AH

## 2023-11-27 ENCOUNTER — Ambulatory Visit (INDEPENDENT_AMBULATORY_CARE_PROVIDER_SITE_OTHER): Admitting: Family Medicine

## 2023-11-27 ENCOUNTER — Ambulatory Visit: Admitting: Adult Health

## 2023-12-01 ENCOUNTER — Telehealth: Payer: Self-pay | Admitting: *Deleted

## 2023-12-01 ENCOUNTER — Ambulatory Visit
Admission: RE | Admit: 2023-12-01 | Discharge: 2023-12-01 | Disposition: A | Discharge status: Home / Self Care | Source: Ambulatory Visit | Attending: Adult Health | Admitting: Adult Health

## 2023-12-01 ENCOUNTER — Ambulatory Visit (INDEPENDENT_AMBULATORY_CARE_PROVIDER_SITE_OTHER): Admitting: Adult Health

## 2023-12-01 ENCOUNTER — Ambulatory Visit: Payer: Self-pay | Admitting: Adult Health

## 2023-12-01 VITALS — BP 128/88 | HR 75 | Temp 96.7°F | Resp 18 | Ht 65.0 in | Wt 325.8 lb

## 2023-12-01 DIAGNOSIS — I10 Essential (primary) hypertension: Secondary | ICD-10-CM

## 2023-12-01 DIAGNOSIS — Z23 Encounter for immunization: Secondary | ICD-10-CM

## 2023-12-01 DIAGNOSIS — R1031 Right lower quadrant pain: Secondary | ICD-10-CM

## 2023-12-01 DIAGNOSIS — E1165 Type 2 diabetes mellitus with hyperglycemia: Secondary | ICD-10-CM | POA: Diagnosis not present

## 2023-12-01 LAB — BASIC METABOLIC PANEL WITH GFR
BUN: 14 mg/dL (ref 7–25)
CO2: 27 mmol/L (ref 20–32)
Calcium: 9.4 mg/dL (ref 8.6–10.4)
Chloride: 106 mmol/L (ref 98–110)
Creat: 0.78 mg/dL (ref 0.50–1.05)
Glucose, Bld: 187 mg/dL — ABNORMAL HIGH (ref 65–139)
Potassium: 3.8 mmol/L (ref 3.5–5.3)
Sodium: 141 mmol/L (ref 135–146)
eGFR: 85 mL/min/1.73m2 (ref >=60)

## 2023-12-01 LAB — CBC WITH DIFFERENTIAL/PLATELET
Absolute Lymphocytes: 1534 {cells}/uL (ref 850–3900)
Absolute Monocytes: 482 {cells}/uL (ref 200–950)
Basophils Absolute: 34 {cells}/uL (ref 0–200)
Basophils Relative: 0.5 %
Eosinophils Absolute: 154 {cells}/uL (ref 15–500)
Eosinophils Relative: 2.3 %
HCT: 35.9 % (ref 35.0–45.0)
Hemoglobin: 11.6 g/dL — ABNORMAL LOW (ref 11.7–15.5)
MCH: 30 pg (ref 27.0–33.0)
MCHC: 32.3 g/dL (ref 32.0–36.0)
MCV: 92.8 fL (ref 80.0–100.0)
MPV: 11.9 fL (ref 7.5–12.5)
Monocytes Relative: 7.2 %
Neutro Abs: 4496 {cells}/uL (ref 1500–7800)
Neutrophils Relative %: 67.1 %
Platelets: 226 Thousand/uL (ref 140–400)
RBC: 3.87 Million/uL (ref 3.80–5.10)
RDW: 13.4 % (ref 11.0–15.0)
Total Lymphocyte: 22.9 %
WBC: 6.7 Thousand/uL (ref 3.8–10.8)

## 2023-12-01 NOTE — Telephone Encounter (Signed)
 Discussed surgical clips with patient, she had gallbladder removal years ago. Surgical clips are normally left there. Patient verbalized understanding.

## 2023-12-01 NOTE — Progress Notes (Signed)
 [facility]  Provider:  Jereld Serum DNP  Code Status:  Full Code  Goals of Care:     05/08/2023    9:30 AM  Advanced Directives  Does Patient Have a Medical Advance Directive? No  Would patient like information on creating a medical advance directive? No - Patient declined     Chief Complaint  Patient presents with   Abdominal Pain    Patient is having pain in her lower right side of her abdomen, patient states it is hard to walk due to the pain.     Discussed the use of AI scribe software for clinical note transcription with the patient, who gave verbal consent to proceed.  HPI: Patient is a 63 y.o. female seen today for an acute visit for RLQ pain.  She has been experiencing right lower quadrant pain for approximately one month. The pain is currently rated as 4 out of 10 in intensity, having decreased from a higher intensity after taking ibuprofen . It is worse at night, affecting her sleep, and is present upon waking. During the day, the pain can suddenly intensify, causing her to stop in her tracks, but it does not prevent her from walking entirely.  The pain is localized to the right side and sometimes extends to the right lower back. She describes it as feeling like 'something there' rather than a spasm. No fever, urinary issues, or blood in her urine. Bowel movements are regular.  She has a history of hypertension, managed with atenolol  12.5 mg daily, Benicar  40 mg daily, and chlorthalidone  12.5 mg daily. For diabetes, she takes metformin  1000 mg twice daily and Mounjaro  7.5 mg weekly. She reports a recent weight gain of 9 pounds over two weeks, despite previous weight loss efforts.  Her A1c was last recorded at 9.2% on August 11, 2023. She denies any recent falls and has not had any imaging studies of the right lower quadrant recently.     Past Medical History:  Diagnosis Date   Anemia    Arthritis    Back pain    Bilateral calcaneal spurs    Bilateral lower  extremity edema    Chest pain    Diabetes (HCC)    Diabetes mellitus without complication (HCC)    type 2   Gallbladder problem    GERD (gastroesophageal reflux disease)    Gout    H/O blood clots    Heartburn    Hyperlipidemia    Hypertension    Obesity    Osteoarthritis    Palpitations    SOBOE (shortness of breath on exertion)    Swallowing difficulty    Type 2 diabetes mellitus (HCC)    Vitamin D  deficiency     Past Surgical History:  Procedure Laterality Date   ABDOMINAL HYSTERECTOMY     partial   CHOLECYSTECTOMY     COLONOSCOPY WITH PROPOFOL  N/A 10/29/2016   Procedure: COLONOSCOPY WITH PROPOFOL ;  Surgeon: Leigh Elspeth SQUIBB, MD;  Location: [facility];  Service: Gastroenterology;  Laterality: N/A;   COLONOSCOPY WITH PROPOFOL  N/A 10/01/2022   Procedure: COLONOSCOPY WITH PROPOFOL ;  Surgeon: Leigh Elspeth SQUIBB, MD;  Location: [facility];  Service: Gastroenterology;  Laterality: N/A;  BMI = 57   DENTAL SURGERY     ORIF TIBIA & FIBULA FRACTURES Left    POLYPECTOMY  10/01/2022   Procedure: POLYPECTOMY;  Surgeon: Leigh Elspeth SQUIBB, MD;  Location: [facility];  Service: Gastroenterology;;    Allergies  Allergen Reactions   Lisinopril  Swelling  Throat swelling   Hydrochlorothiazide      Other Reaction(s): sleepy   Tape Other (See Comments)    Certain Band aids cause skin irritation    Outpatient Encounter Medications as of 12/01/2023  Medication Sig   atenolol  (TENORMIN ) 50 MG tablet Take 0.5 tablets (25 mg total) by mouth daily.   atorvastatin  (LIPITOR) 40 MG tablet Take 1 tablet (40 mg total) by mouth daily.   Blood Glucose Monitoring Suppl (ACCU-CHEK GUIDE) w/Device KIT 1 Device by Does not apply route daily. CBG daily, pls give strips and lancets   chlorthalidone  (HYGROTON ) 25 MG tablet Take 0.5 tablets (12.5 mg total) by mouth every morning.   Cholecalciferol  1.25 MG (50000 UT) capsule Take 1 capsule (50,000 Units total) by mouth once a week.    glucose blood (ACCU-CHEK GUIDE) test strip Use as instructed   metFORMIN  (GLUCOPHAGE ) 1000 MG tablet Take 1 tablet (1,000 mg total) by mouth 2 (two) times daily with a meal. (Patient taking differently: Take 1,000 mg by mouth 2 (two) times daily with a meal. Taking 1 tablet per day)   olmesartan  (BENICAR ) 40 MG tablet Take 1 tablet (40 mg total) by mouth daily.   tirzepatide  (MOUNJARO ) 7.5 MG/0.5ML Pen Inject 7.5 mg into the skin once a week.   No facility-administered encounter medications on file as of 12/01/2023.    Review of Systems:  Review of Systems  Constitutional:  Negative for appetite change, chills, fatigue and fever.  HENT:  Negative for congestion, hearing loss, rhinorrhea and sore throat.   Eyes: Negative.   Respiratory:  Negative for cough, shortness of breath and wheezing.   Cardiovascular:  Negative for chest pain, palpitations and leg swelling.  Gastrointestinal:  Positive for abdominal pain. Negative for constipation, diarrhea, nausea and vomiting.  Genitourinary:  Negative for dysuria.  Musculoskeletal:  Positive for back pain. Negative for arthralgias and myalgias.  Skin:  Negative for color change, rash and wound.  Neurological:  Negative for dizziness, weakness and headaches.  Psychiatric/Behavioral:  Negative for behavioral problems. The patient is not nervous/anxious.     Health Maintenance  Topic Date Due   Zoster Vaccines- Shingrix (1 of 2) Never done   Hepatitis B Vaccines 19-59 Average Risk (2 of 3 - 19+ 3-dose series) 03/17/2015   OPHTHALMOLOGY EXAM  07/17/2023   COVID-19 Vaccine (4 - 2025-26 season) 10/13/2023   HEMOGLOBIN A1C  02/10/2024   Mammogram  08/04/2024   FOOT EXAM  08/07/2024   Diabetic kidney evaluation - eGFR measurement  08/10/2024   Diabetic kidney evaluation - Urine ACR  08/10/2024   Cervical Cancer Screening (HPV/Pap Cotest)  04/22/2028   DTaP/Tdap/Td (2 - Td or Tdap) 10/25/2028   Colonoscopy  09/30/2032   Pneumococcal Vaccine: 50+  Years  Completed   Influenza Vaccine  Completed   Hepatitis C Screening  Completed   HIV Screening  Completed   HPV VACCINES  Aged Out   Meningococcal B Vaccine  Aged Out    Physical Exam: Vitals:   12/01/23 1020  BP: 128/88  Pulse: 75  Resp: 18  Temp: (!) 96.7 F (35.9 C)  SpO2: 98%  Weight: (!) 325 lb 12.8 oz (147.8 kg)  Height: 5' 5 (1.651 m)   Body mass index is 54.22 kg/m. Physical Exam Constitutional:      Appearance: Normal appearance. She is obese.  HENT:     Head: Normocephalic and atraumatic.     Nose: Nose normal.     Mouth/Throat:     Mouth:  Mucous membranes are moist.  Eyes:     Conjunctiva/sclera: Conjunctivae normal.  Cardiovascular:     Rate and Rhythm: Normal rate and regular rhythm.  Pulmonary:     Effort: Pulmonary effort is normal.     Breath sounds: Normal breath sounds.  Abdominal:     General: Bowel sounds are normal.     Palpations: Abdomen is soft.  Musculoskeletal:        General: Normal range of motion.     Cervical back: Normal range of motion.  Skin:    General: Skin is warm and dry.  Neurological:     General: No focal deficit present.     Mental Status: She is alert and oriented to person, place, and time.  Psychiatric:        Mood and Affect: Mood normal.        Behavior: Behavior normal.        Thought Content: Thought content normal.        Judgment: Judgment normal.     Labs reviewed: Basic Metabolic Panel: Recent Labs    01/24/23 0857 05/08/23 1007 08/11/23 0814  NA 140 140 139  K 4.0 4.2 4.2  CL 103 103 104  CO2 30 27 27   GLUCOSE 227* 230* 226*  BUN 16 17 17   CREATININE 0.80 0.75 0.83  CALCIUM  9.9 9.6 9.6   Liver Function Tests: Recent Labs    01/24/23 0857 08/11/23 0814  AST 15 15  ALT 19 21  BILITOT 0.3 0.4  PROT 6.6 6.8   No results for input(s): LIPASE, AMYLASE in the last 8760 hours. No results for input(s): AMMONIA in the last 8760 hours. CBC: Recent Labs    01/24/23 0857  05/08/23 1007  WBC 7.0 7.2  NEUTROABS 4,438 4,615  HGB 12.2 11.9  HCT 38.7 36.3  MCV 94.4 89.9  PLT 250 235   Lipid Panel: Recent Labs    01/24/23 0857 05/08/23 1007 08/11/23 0814  CHOL 145 152 165  HDL 47* 49* 52  LDLCALC 82 84 96  TRIG 80 93 82  CHOLHDL 3.1 3.1 3.2   Lab Results  Component Value Date   HGBA1C 9.2 (H) 08/11/2023    Procedures since last visit: DG Abd 2 Views Result Date: 12/01/2023 CLINICAL DATA:  RIGHT lower quadrant abdominal pain for 1 month. EXAM: ABDOMEN - 2 VIEW COMPARISON:  None available FINDINGS: No dilated loops of bowel to indicate ileus or obstruction. Surgical clips seen in the RIGHT upper quadrant. No suspicious abdominal calcifications. Moderate degenerative changes seen throughout the lumbar spine. IMPRESSION: 1. Nonspecific nonobstructive bowel gas pattern. 2. Multilevel degenerative changes of the lumbar spine. Electronically Signed   By: Aliene Lloyd M.D.   On: 12/01/2023 12:10   DG Hip Unilat W OR W/O Pelvis 2-3 Views Right Result Date: 12/01/2023 CLINICAL DATA:  RIGHT hip pain. EXAM: DG HIP (WITH OR WITHOUT PELVIS) 2-3V RIGHT COMPARISON:  04/25/2022 FINDINGS: No acute osseous abnormalities. Mild-to-moderate degenerative changes seen in the lower lumbar spine. Mild degenerative changes of the hips. Visualized soft tissues are unremarkable. IMPRESSION: Mild degenerative changes of the RIGHT hip. Electronically Signed   By: Aliene Lloyd M.D.   On: 12/01/2023 12:08    Assessment/Plan  1. Right lower quadrant abdominal pain -  Chronic pain for two months, 4/10 severity, worsens with movement, relieved by ibuprofen . - Order x-ray of the right lower quadrant abdomen. - Order x-ray of the right hip. - DG Abd 2 Views - DG Hip  Unilat W OR W/O Pelvis 2-3 Views Right - CBC with Differential/Platelets  2. Type 2 diabetes mellitus with hyperglycemia, unspecified whether long term insulin  use (HCC) -  A1c 9.2% indicates poor glycemic control.  Recent 9-pound weight gain. On metformin  and Mounjaro . - Order CBC and BMP. - Encouraged increased physical activity and low carb diet - Flu vaccine HIGH DOSE PF(Fluzone Trivalent)  3. Essential hypertension -  Controlled with atenolol , olmesartan , and chlorthalidone . BP 128/88 mmHg. - Basic Metabolic Panel  4. Immunization due (Primary) - Flu vaccine HIGH DOSE PF(Fluzone Trivalent)       Labs/tests ordered:   CBC and BMP   Return if symptoms worsen or fail to improve.  Ruth Hannig Medina-Vargas, NP

## 2023-12-01 NOTE — Progress Notes (Signed)
-    Discussed result with patient -  nonspecific nonobstructive bowel gas pattern -  mild degenerative changes of right hip -  multilevel degenerative changes of the lumbar spine -   no new orders

## 2023-12-01 NOTE — Telephone Encounter (Signed)
 Copied from CRM #[id number]. Topic: Clinical - Lab/Test Results >> Dec 01, 2023  3:13 PM Ruth [name] wrote: Reason for CRM: Patient would like to speak to a nurse or pcp to know more about her xray readings that she saw on mychart. She states she saw something regarding surgical information and she's never had a surgery

## 2023-12-02 NOTE — Progress Notes (Signed)
-    WBC not elevated -  no anemia, electrolytes within normal levels.

## 2023-12-03 ENCOUNTER — Ambulatory Visit (INDEPENDENT_AMBULATORY_CARE_PROVIDER_SITE_OTHER): Admitting: Family Medicine

## 2023-12-03 ENCOUNTER — Encounter (INDEPENDENT_AMBULATORY_CARE_PROVIDER_SITE_OTHER): Payer: Self-pay | Admitting: Family Medicine

## 2023-12-03 VITALS — BP 160/86 | HR 75 | Temp 98.0°F | Ht 65.0 in | Wt 321.0 lb

## 2023-12-03 DIAGNOSIS — E1165 Type 2 diabetes mellitus with hyperglycemia: Secondary | ICD-10-CM

## 2023-12-03 DIAGNOSIS — I152 Hypertension secondary to endocrine disorders: Secondary | ICD-10-CM | POA: Diagnosis not present

## 2023-12-03 DIAGNOSIS — Z6841 Body Mass Index (BMI) 40.0 and over, adult: Secondary | ICD-10-CM

## 2023-12-03 DIAGNOSIS — Z7985 Long-term (current) use of injectable non-insulin antidiabetic drugs: Secondary | ICD-10-CM

## 2023-12-03 DIAGNOSIS — E669 Obesity, unspecified: Secondary | ICD-10-CM

## 2023-12-03 DIAGNOSIS — E1159 Type 2 diabetes mellitus with other circulatory complications: Secondary | ICD-10-CM | POA: Diagnosis not present

## 2023-12-03 MED ORDER — TIRZEPATIDE 7.5 MG/0.5ML ~~LOC~~ SOAJ: 7.5000 mg | SUBCUTANEOUS | 0 refills | Status: DC

## 2023-12-03 NOTE — Assessment & Plan Note (Addendum)
 Patient is tolerating mounjaro  without any side effects.  She is in need of a refill today.  She isn't always getting all the protein and nutrition in.  Will continue current dose until patient has been [place] to consistently weigh and measure food to ensure she is [place] to get close to her nutrition goals.

## 2023-12-03 NOTE — Progress Notes (Signed)
 SUBJECTIVE:  Chief Complaint: Obesity  Interim History: Patient mentions she recently became a great grandmother.  She hasn't been walking much due to arthritis and pain in her hip and back.  She is [place] to get about 50% of her meal plan in daily.  She is missing out on a combination of parts of meals.  She is hungry at night.  She is getting in quite a bit of the dinner meal.  She does realize the portion isn't as large as it needs to be.  She isn't using the scale as much as she knows she needs to.  No plans for Halloween.  Will have a family get together for Thanksgiving.   [place] is here to discuss her progress with her obesity treatment plan. She is on the Category 3 Plan and states she is following her eating plan approximately 50 % of the time. She states she is not exercising.   OBJECTIVE: Visit Diagnoses: Problem List Items Addressed This Visit       Cardiovascular and Mediastinum   Hypertension associated with diabetes (HCC) - Primary   She attributes blood pressure elevation to eating ham.  She has eaten ham for more than 1 meal.  She is still taking her blood pressure medication as prescribed.  Previously blood pressures controlled.  No change in current doses or medication.        Relevant Medications   tirzepatide  (MOUNJARO ) 7.5 MG/0.5ML Pen     Endocrine   DM (diabetes mellitus) (HCC)   Relevant Medications   tirzepatide  (MOUNJARO ) 7.5 MG/0.5ML Pen   Type 2 diabetes mellitus with hyperglycemia (HCC)   Patient is tolerating mounjaro  without any side effects.  She is in need of a refill today.  She isn't always getting all the protein and nutrition in.  Will continue current dose until patient has been Ask to consistently weigh and measure food to ensure she is [place] to get close to her nutrition goals.      Relevant Medications   tirzepatide  (MOUNJARO ) 7.5 MG/0.5ML Pen     Other   BMI 50.0-59.9, adult (HCC)   Relevant Medications   tirzepatide  (MOUNJARO ) 7.5  MG/0.5ML Pen   Obesity with starting BMI of 57.5   Relevant Medications   tirzepatide  (MOUNJARO ) 7.5 MG/0.5ML Pen    Vitals Temp: 98 F (36.7 C) BP: (!) 160/86 Pulse Rate: 75 SpO2: 99 %   Anthropometric Measurements Height: 5' 5 (1.651 m) Weight: (!) 321 lb (145.6 kg) BMI (Calculated): 53.42 Weight at Last Visit: 317 lb Weight Lost Since Last Visit: 0 Weight Gained Since Last Visit: 4 Starting Weight: 335 lb Total Weight Loss (lbs): 14 lb (6.35 kg)   Body Composition  Body Fat %: 56.1 % Fat Mass (lbs): 180.2 lbs Muscle Mass (lbs): 134 lbs Total Body Water (lbs): 103.2 lbs Visceral Fat Rating : 23   Other Clinical Data Today's Visit #: 16 Starting Date: 07/30/22 Comments: Cat 3     ASSESSMENT AND PLAN: Assessment & Plan Hypertension associated with diabetes (HCC) She attributes blood pressure elevation to eating ham.  She has eaten ham for more than 1 meal.  She is still taking her blood pressure medication as prescribed.  Previously blood pressures controlled.  No change in current doses or medication.   Type 2 diabetes mellitus with hyperglycemia, without long-term current use of insulin  (HCC)  Type 2 diabetes mellitus with hyperglycemia, unspecified whether long term insulin  use (HCC) Patient is tolerating mounjaro  without any side effects.  She is in need of a refill today.  She isn't always getting all the protein and nutrition in.  Will continue current dose until patient has been [place] to consistently weigh and measure food to ensure she is [place] to get close to her nutrition goals. BMI 50.0-59.9, adult (HCC)  Obesity with starting BMI of 57.6    Diet: [place] is currently in the action stage of change. As such, her goal is to continue with weight loss efforts and has agreed to the Category 3 Plan.   Exercise:  For substantial health benefits, adults should do at least 150 minutes (2 hours and 30 minutes) a week of moderate-intensity, or 75 minutes (1  hour and 15 minutes) a week of vigorous-intensity aerobic physical activity, or an equivalent combination of moderate- and vigorous-intensity aerobic activity. Aerobic activity should be performed in episodes of at least 10 minutes, and preferably, it should be spread throughout the week.  Behavior Modification:  We discussed the following Behavioral Modification Strategies today: increasing lean protein intake, decreasing simple carbohydrates, increasing vegetables, meal planning and cooking strategies, and keeping healthy foods in the home.   Return in about 4 weeks (around 12/31/2023).   She was informed of the importance of frequent follow up visits to maximize her success with intensive lifestyle modifications for her multiple health conditions.  Attestation Statements:   Reviewed by clinician on day of visit: allergies, medications, problem list, medical history, surgical history, family history, social history, and previous encounter notes.     Adelita Cho, MD

## 2023-12-03 NOTE — Assessment & Plan Note (Addendum)
 She attributes blood pressure elevation to eating ham.  She has eaten ham for more than 1 meal.  She is still taking her blood pressure medication as prescribed.  Previously blood pressures controlled.  No change in current doses or medication.

## 2023-12-16 ENCOUNTER — Ambulatory Visit: Admitting: Podiatry

## 2023-12-16 DIAGNOSIS — B351 Tinea unguium: Secondary | ICD-10-CM | POA: Diagnosis not present

## 2023-12-16 DIAGNOSIS — M79676 Pain in unspecified toe(s): Secondary | ICD-10-CM | POA: Diagnosis not present

## 2023-12-16 DIAGNOSIS — E1142 Type 2 diabetes mellitus with diabetic polyneuropathy: Secondary | ICD-10-CM

## 2023-12-21 ENCOUNTER — Encounter: Payer: Self-pay | Admitting: Podiatry

## 2023-12-21 DIAGNOSIS — R809 Proteinuria, unspecified: Secondary | ICD-10-CM | POA: Insufficient documentation

## 2023-12-21 DIAGNOSIS — M51369 Other intervertebral disc degeneration, lumbar region without mention of lumbar back pain or lower extremity pain: Secondary | ICD-10-CM | POA: Insufficient documentation

## 2023-12-21 DIAGNOSIS — G5600 Carpal tunnel syndrome, unspecified upper limb: Secondary | ICD-10-CM | POA: Insufficient documentation

## 2023-12-21 NOTE — Progress Notes (Signed)
  Subjective:  Patient ID: Ruth [name], female    DOB: 1960/07/16,  MRN: [id number]  Chief Complaint  Patient presents with   RFC    Diabetic  A1c 9.2 08/11/23 PCP Medina-Vargas, Monina C, NP, 12/01/23     63 y.o. female presents with preventative diabetic foot care for painful elongated mycotic toenails 1-5 bilaterally which are tender when wearing enclosed shoe gear. Pain is relieved with periodic professional debridement..    Review of Systems: Negative except as noted in the HPI.   Allergies  Allergen Reactions   Lisinopril  Swelling    Throat swelling   Hydrochlorothiazide      Other Reaction(s): sleepy   Tape Other (See Comments)    Certain Band aids cause skin irritation    Objective:  There were no vitals filed for this visit. Constitutional Patient is a pleasant 63 y.o. female morbidly obese in NAD. AAO x 3.  Vascular Capillary fill time to digits immediate b/l.  DP/PT pulse(s) are palpable b/l lower extremities. Pedal hair sparse. Lower extremity skin temperature gradient within normal limits. No pain with calf compression b/l. No edema noted b/l lower extremities. No cyanosis or clubbing noted.   Neurologic Protective sensation intact 5/5 intact bilaterally with 10g monofilament b/l. Vibratory sensation intact b/l. No clonus b/l.   Dermatologic Pedal skin is warm and supple b/l.  No open wounds b/l lower extremities. No interdigital macerations b/l lower extremities. Toenails 1-5 b/l elongated, discolored, dystrophic, thickened, crumbly with subungual debris and tenderness to dorsal palpation. No hyperkeratotic nor porokeratotic lesions.  Orthopedic: Normal muscle strength 5/5 to all lower extremity muscle groups bilaterally. No pain, crepitus or joint limitation noted with ROM bilateral LE. No gross bony deformities bilaterally.      Latest Ref Rng & Units 08/11/2023    8:14 AM 05/08/2023   10:07 AM 01/24/2023    8:57 AM  Hemoglobin A1C  Hemoglobin-A1c <5.7 % 9.2  9.2   9.8    Radiographs:  None  Assessment:   1. Pain due to onychomycosis of toenail   2. Diabetic polyneuropathy associated with type 2 diabetes mellitus (HCC)    Plan:  Patient was evaluated and treated. All patient's and/or POA's questions/concerns addressed on today's visit. Toenails 1-5 b/l debrided in length and girth without incident. Continue foot and shoe inspections daily. Monitor blood glucose per PCP/Endocrinologist's recommendations. Continue soft, supportive shoe gear daily. Report any pedal injuries to medical professional. Call office if there are any questions/concerns. -Patient/POA to call should there be question/concern in the interim.  Return in about 3 months (around 03/17/2024).  Ruth [name], DPM      [facility] LOCATION: [address] [place] [address]                   Office [phone]   [facility] LOCATION: [address] [place], [place] [address] Office [phone]

## 2023-12-22 ENCOUNTER — Ambulatory Visit: Payer: Self-pay

## 2023-12-22 NOTE — Telephone Encounter (Signed)
 FYI Only or Action Required?: FYI only for provider: appointment scheduled on tomorrow.  Patient was last seen in primary care on 12/03/2023 by [place] Adelita PENNER, MD.  Called Nurse Triage reporting Abdominal Pain.  Symptoms began several weeks ago.  Interventions attempted: OTC medications: 800 mg tabs advil .  Symptoms are: gradually worsening.  Triage Disposition: See PCP Within 2 Weeks  Patient/caregiver understands and will follow disposition?: Yes, will follow disposition  Copied from CRM [id number]. Topic: Clinical - Medication Question >> Dec 22, 2023 11:30 AM DeAngela L wrote: Reason for CRM: Patient is calling to ask if her provider can prescribe her pain medication for the right side pain, after the xray the patient was not prescribed a pain medication and she was wondering is there a reason? Also the patient states she is still having the right side pain anytime she walks get up or sit down, and when she sits a certain way and also when she lay down or move the pain hurts, the patient states the pain is in a spot that hurts but not pain level 10 Patient took 800 ibuprofen  and this worked and helped with the pain and at that time the pain was at a 10 and now after taking the ibuprofen  the pain is not level 10, but the patient no longer have 800 ibuprofen  pills left   Pt num [phone] (M) Reason for Disposition  Abdominal pain is a chronic symptom (recurrent or ongoing AND present > 4 weeks)  Answer Assessment - Initial Assessment Questions 1. LOCATION: Where does it hurt?      RLQ 2. RADIATION: Does the pain shoot anywhere else? (e.g., chest, back)     Pt states localized, does not radiate 3. ONSET: When did the pain begin? (e.g., minutes, hours or days ago)      Ongoing, has been seen for this,  5. PATTERN Does the pain come and go, or is it constant?     There, but severity increases, especially with movement 6. SEVERITY: How bad is the pain?  (e.g., Scale  1-10; mild, moderate, or severe)     Currently pain level 5 7. RECURRENT SYMPTOM: Have you ever had this type of stomach pain before? If Yes, ask: When was the last time? and What happened that time?      denies 8. CAUSE: What do you think is causing the stomach pain? (e.g., gallstones, recent abdominal surgery)     Unsure, has had imaging 9. RELIEVING/AGGRAVATING FACTORS: What makes it better or worse? (e.g., antacids, bending or twisting motion, bowel movement)     Movement makes worse 10. OTHER SYMPTOMS: Do you have any other symptoms? (e.g., back pain, diarrhea, fever, urination pain, vomiting)       Denies GU/GI, only hurts with movement. Pt states that she has known surgical clips in her abd, pt states that she was not given any pain medications.  Protocols used: Abdominal Pain - Female-A-AH

## 2023-12-22 NOTE — Telephone Encounter (Signed)
 Noted, will see patient tomorrow, 12/23/23

## 2023-12-23 ENCOUNTER — Ambulatory Visit: Admitting: Adult Health

## 2023-12-23 ENCOUNTER — Encounter: Payer: Self-pay | Admitting: Adult Health

## 2023-12-23 ENCOUNTER — Ambulatory Visit (INDEPENDENT_AMBULATORY_CARE_PROVIDER_SITE_OTHER): Admitting: Adult Health

## 2023-12-23 VITALS — BP 128/72 | HR 87 | Temp 96.7°F | Resp 18 | Ht 65.0 in | Wt 326.2 lb

## 2023-12-23 DIAGNOSIS — Z7984 Long term (current) use of oral hypoglycemic drugs: Secondary | ICD-10-CM

## 2023-12-23 DIAGNOSIS — E1169 Type 2 diabetes mellitus with other specified complication: Secondary | ICD-10-CM

## 2023-12-23 DIAGNOSIS — R1031 Right lower quadrant pain: Secondary | ICD-10-CM | POA: Diagnosis not present

## 2023-12-23 DIAGNOSIS — K5792 Diverticulitis of intestine, part unspecified, without perforation or abscess without bleeding: Secondary | ICD-10-CM | POA: Diagnosis not present

## 2023-12-23 LAB — POCT URINALYSIS DIPSTICK
Bilirubin, UA: NEGATIVE
Blood, UA: NEGATIVE
Glucose, UA: NEGATIVE
Ketones, UA: NEGATIVE
Leukocytes, UA: NEGATIVE
Nitrite, UA: NEGATIVE
Protein, UA: NEGATIVE
Spec Grav, UA: 1.025 (ref 1.010–1.025)
Urobilinogen, UA: 0.2 U/dL
pH, UA: 6 (ref 5.0–8.0)

## 2023-12-23 MED ORDER — IBUPROFEN 800 MG PO TABS: 800.0000 mg | ORAL_TABLET | Freq: Two times a day (BID) | ORAL | 0 refills | Status: AC | PRN

## 2023-12-23 NOTE — Progress Notes (Unsigned)
 [facility] clinic  Provider:  Jereld Serum DNP  Code Status:  Full Code  Goals of Care:     05/08/2023    9:30 AM  Advanced Directives  Does Patient Have a Medical Advance Directive? No  Would patient like information on creating a medical advance directive? No - Patient declined     Chief Complaint  Patient presents with   Abdominal Pain    Patient states she is still having the right side pain anytime she walks get up or sit down, and when she sits a certain way and also when she lay down or move the pain hurts, the patient states the pain is in a spot that hurts but not pain level 10. Patient took 800 ibuprofen  and this worked and helped with the pain and at that time the pain was at a 10 and now after taking the ibuprofen  the pain is not level 10, but the patient no longer have 800 ibuprofen  pills left       Discussed the use of AI scribe software for clinical note transcription with the patient, who gave verbal consent to proceed.   HPI: Patient is a 63 y.o. female seen today for an acute visit for RLQ abdominal pain. She is accompanied by her husband.  She experiences right lower quadrant abdominal pain daily, which intensifies when getting into bed. The pain is rated as a 'three or four' on a scale of ten, indicating moderate discomfort. Initially, the pain was more severe and would wake her up at night, but it has since lessened in intensity. She has been using ibuprofen  800 mg twice a day as needed for pain relief, which she finds effective, but she has run out of her supply. No fever reported by the patient.  She has a history of osteoarthritis, confirmed by a previous x-ray showing arthritis in her spine. She experiences pain in her lower legs, particularly when moving or walking, which has impacted her ability to maintain her walking routine for weight management.  She is managing type 2 diabetes with metformin  1000 mg once a day, as taking it twice a day causes  gastrointestinal discomfort. Her last recorded A1c was 9.2% in June, indicating suboptimal control of her diabetes. She mentions a recent weight gain of five pounds, attributing it to decreased physical activity due to pain and possibly insufficient protein intake in her diet.    Past Medical History:  Diagnosis Date   Anemia    Arthritis    Back pain    Bilateral calcaneal spurs    Bilateral lower extremity edema    Chest pain    Diabetes (HCC)    Diabetes mellitus without complication (HCC)    type 2   Gallbladder problem    GERD (gastroesophageal reflux disease)    Gout    H/O blood clots    Heartburn    Hyperlipidemia    Hypertension    Obesity    Osteoarthritis    Palpitations    SOBOE (shortness of breath on exertion)    Swallowing difficulty    Type 2 diabetes mellitus (HCC)    Vitamin D  deficiency     Past Surgical History:  Procedure Laterality Date   ABDOMINAL HYSTERECTOMY     partial   CHOLECYSTECTOMY     COLONOSCOPY WITH PROPOFOL  N/A 10/29/2016   Procedure: COLONOSCOPY WITH PROPOFOL ;  Surgeon: Leigh Elspeth SQUIBB, MD;  Location: [facility];  Service: Gastroenterology;  Laterality: N/A;   COLONOSCOPY WITH PROPOFOL   N/A 10/01/2022   Procedure: COLONOSCOPY WITH PROPOFOL ;  Surgeon: Leigh Elspeth SQUIBB, MD;  Location: [facility];  Service: Gastroenterology;  Laterality: N/A;  BMI = 57   DENTAL SURGERY     ORIF TIBIA & FIBULA FRACTURES Left    POLYPECTOMY  10/01/2022   Procedure: POLYPECTOMY;  Surgeon: Leigh Elspeth SQUIBB, MD;  Location: [facility];  Service: Gastroenterology;;    Allergies  Allergen Reactions   Lisinopril  Swelling    Throat swelling   Hydrochlorothiazide      Other Reaction(s): sleepy   Tape Other (See Comments)    Certain Band aids cause skin irritation    Outpatient Encounter Medications as of 12/23/2023  Medication Sig   atenolol  (TENORMIN ) 50 MG tablet Take 0.5 tablets (25 mg total) by mouth daily.   atorvastatin  (LIPITOR)  40 MG tablet Take 1 tablet (40 mg total) by mouth daily.   Blood Glucose Monitoring Suppl (ACCU-CHEK GUIDE) w/Device KIT 1 Device by Does not apply route daily. CBG daily, pls give strips and lancets   chlorthalidone  (HYGROTON ) 25 MG tablet Take 0.5 tablets (12.5 mg total) by mouth every morning.   ciprofloxacin (CIPRO) 500 MG tablet Take 1 tablet (500 mg total) by mouth 2 (two) times daily for 10 days.   glucose blood (ACCU-CHEK GUIDE) test strip Use as instructed   ibuprofen  (ADVIL ) 800 MG tablet Take 1 tablet (800 mg total) by mouth 2 (two) times daily as needed. Take with food   metFORMIN  (GLUCOPHAGE ) 1000 MG tablet Take 1 tablet (1,000 mg total) by mouth 2 (two) times daily with a meal. (Patient taking differently: Take 1,000 mg by mouth 2 (two) times daily with a meal. Taking 1 tablet per day)   metroNIDAZOLE  (FLAGYL ) 500 MG tablet Take 1 tablet (500 mg total) by mouth 3 (three) times daily for 10 days.   olmesartan  (BENICAR ) 40 MG tablet Take 1 tablet (40 mg total) by mouth daily.   [DISCONTINUED] Cholecalciferol  1.25 MG (50000 UT) capsule Take 1 capsule (50,000 Units total) by mouth once a week.   [DISCONTINUED] tirzepatide  (MOUNJARO ) 7.5 MG/0.5ML Pen Inject 7.5 mg into the skin once a week.   No facility-administered encounter medications on file as of 12/23/2023.    Review of Systems:  Review of Systems  Constitutional:  Negative for appetite change, chills, fatigue and fever.  HENT:  Negative for congestion, hearing loss, rhinorrhea and sore throat.   Eyes: Negative.   Respiratory:  Negative for cough, shortness of breath and wheezing.   Cardiovascular:  Negative for chest pain, palpitations and leg swelling.  Gastrointestinal:  Positive for abdominal pain. Negative for constipation, diarrhea, nausea and vomiting.  Genitourinary:  Negative for dysuria.  Musculoskeletal:  Negative for arthralgias, back pain and myalgias.  Skin:  Negative for color change, rash and wound.   Neurological:  Negative for dizziness, weakness and headaches.  Psychiatric/Behavioral:  Negative for behavioral problems. The patient is not nervous/anxious.     Health Maintenance  Topic Date Due   Zoster Vaccines- Shingrix (1 of 2) Never done   Hepatitis B Vaccines 19-59 Average Risk (2 of 3 - 19+ 3-dose series) 03/17/2015   OPHTHALMOLOGY EXAM  07/17/2023   COVID-19 Vaccine (4 - 2025-26 season) 10/13/2023   HEMOGLOBIN A1C  02/10/2024   Mammogram  08/04/2024   FOOT EXAM  08/07/2024   Diabetic kidney evaluation - Urine ACR  08/10/2024   Diabetic kidney evaluation - eGFR measurement  11/30/2024   Cervical Cancer Screening (HPV/Pap Cotest)  04/22/2028  DTaP/Tdap/Td (2 - Td or Tdap) 10/25/2028   Colonoscopy  09/30/2032   Pneumococcal Vaccine: 50+ Years  Completed   Influenza Vaccine  Completed   Hepatitis C Screening  Completed   HIV Screening  Completed   HPV VACCINES  Aged Out   Meningococcal B Vaccine  Aged Out    Physical Exam: Vitals:   12/23/23 1422  BP: 128/72  Pulse: 87  Resp: 18  Temp: (!) 96.7 F (35.9 C)  SpO2: 95%  Weight: (!) 326 lb 3.2 oz (148 kg)  Height: 5' 5 (1.651 m)   Body mass index is 54.28 kg/m. Physical Exam Constitutional:      Appearance: Normal appearance. She is obese.  HENT:     Head: Normocephalic and atraumatic.     Nose: Nose normal.     Mouth/Throat:     Mouth: Mucous membranes are moist.  Eyes:     Conjunctiva/sclera: Conjunctivae normal.  Cardiovascular:     Rate and Rhythm: Normal rate and regular rhythm.  Pulmonary:     Effort: Pulmonary effort is normal.     Breath sounds: Normal breath sounds.  Abdominal:     General: Bowel sounds are normal.     Palpations: Abdomen is soft.  Musculoskeletal:        General: Normal range of motion.     Cervical back: Normal range of motion.  Skin:    General: Skin is warm and dry.  Neurological:     General: No focal deficit present.     Mental Status: She is alert and oriented  to person, place, and time.  Psychiatric:        Mood and Affect: Mood normal.        Behavior: Behavior normal.        Thought Content: Thought content normal.        Judgment: Judgment normal.     Labs reviewed: Basic Metabolic Panel: Recent Labs    05/08/23 1007 08/11/23 0814 12/01/23 1100  NA 140 139 141  K 4.2 4.2 3.8  CL 103 104 106  CO2 27 27 27   GLUCOSE 230* 226* 187*  BUN 17 17 14   CREATININE 0.75 0.83 0.78  CALCIUM  9.6 9.6 9.4   Liver Function Tests: Recent Labs    01/24/23 0857 08/11/23 0814  AST 15 15  ALT 19 21  BILITOT 0.3 0.4  PROT 6.6 6.8   No results for input(s): LIPASE, AMYLASE in the last 8760 hours. No results for input(s): AMMONIA in the last 8760 hours. CBC: Recent Labs    01/24/23 0857 05/08/23 1007 12/01/23 1100  WBC 7.0 7.2 6.7  NEUTROABS 4,438 4,615 4,496  HGB 12.2 11.9 11.6*  HCT 38.7 36.3 35.9  MCV 94.4 89.9 92.8  PLT 250 235 226   Lipid Panel: Recent Labs    01/24/23 0857 05/08/23 1007 08/11/23 0814  CHOL 145 152 165  HDL 47* 49* 52  LDLCALC 82 84 96  TRIG 80 93 82  CHOLHDL 3.1 3.1 3.2   Lab Results  Component Value Date   HGBA1C 9.2 (H) 08/11/2023    Procedures since last visit: CT ABDOMEN PELVIS WO CONTRAST Result Date: 12/27/2023 CLINICAL DATA:  Right lower quadrant pain. EXAM: CT ABDOMEN AND PELVIS WITHOUT CONTRAST TECHNIQUE: Multidetector CT imaging of the abdomen and pelvis was performed following the standard protocol without IV contrast. RADIATION DOSE REDUCTION: This exam was performed according to the departmental dose-optimization program which includes automated exposure control, adjustment of the mA  and/or kV according to patient size and/or use of iterative reconstruction technique. COMPARISON:  None Available. FINDINGS: Lower chest: No acute findings. Hepatobiliary: No mass visualized on this unenhanced exam. Prior cholecystectomy. No evidence of biliary obstruction. Pancreas: No mass or  inflammatory process visualized on this unenhanced exam. Spleen:  Within normal limits in size. Adrenals/Urinary tract: No evidence of urolithiasis or hydronephrosis. Unremarkable unopacified urinary bladder. Stomach/Bowel: Normal appendix visualized. Diffuse colonic diverticulosis is noted. Mild wall thickening and pericolonic inflammatory change is seen involving the proximal descending colon, consistent with mild diverticulitis. No No evidence of perforation or abscess. Vascular/Lymphatic: No pathologically enlarged lymph nodes identified. No evidence of abdominal aortic aneurysm. Reproductive: Prior hysterectomy noted. Adnexal regions are unremarkable in appearance. Other:  None. Musculoskeletal:  No suspicious bone lesions identified. IMPRESSION: Mild diverticulitis involving the proximal descending colon. No evidence of perforation, abscess, or other complication. Electronically Signed   By: Norleen DELENA Kil M.D.   On: 12/27/2023 12:59   DG Abd 2 Views Result Date: 12/01/2023 CLINICAL DATA:  RIGHT lower quadrant abdominal pain for 1 month. EXAM: ABDOMEN - 2 VIEW COMPARISON:  None available FINDINGS: No dilated loops of bowel to indicate ileus or obstruction. Surgical clips seen in the RIGHT upper quadrant. No suspicious abdominal calcifications. Moderate degenerative changes seen throughout the lumbar spine. IMPRESSION: 1. Nonspecific nonobstructive bowel gas pattern. 2. Multilevel degenerative changes of the lumbar spine. Electronically Signed   By: Aliene Lloyd M.D.   On: 12/01/2023 12:10   DG Hip Unilat W OR W/O Pelvis 2-3 Views Right Result Date: 12/01/2023 CLINICAL DATA:  RIGHT hip pain. EXAM: DG HIP (WITH OR WITHOUT PELVIS) 2-3V RIGHT COMPARISON:  04/25/2022 FINDINGS: No acute osseous abnormalities. Mild-to-moderate degenerative changes seen in the lower lumbar spine. Mild degenerative changes of the hips. Visualized soft tissues are unremarkable. IMPRESSION: Mild degenerative changes of the RIGHT  hip. Electronically Signed   By: Aliene Lloyd M.D.   On: 12/01/2023 12:08    Assessment/Plan  1. Right lower quadrant abdominal pain (Primary) -  exacerbated by movement and nocturnal activities. Differential includes appendicitis, though appendix is soft and non-tender. Previous x-ray showed osteoarthritis. - Ordered CT scan of the abdomen without contrast. - Ordered urinalysis. - Prescribed ibuprofen  800 mg twice daily as needed with food. - CT ABDOMEN PELVIS WO CONTRAST; Future - POC Urinalysis Dipstick - Culture, Urine  2. Type 2 diabetes mellitus with morbid obesity (HCC) -   Recent A1c of 9.2%. Reports nausea with metformin  twice daily. Weight gain possibly due to decreased activity from pain. - Continue metformin  1000 mg once daily. - Scheduled follow-up appointment in one month for blood work, including A1c, with fasting required.  3. Morbidly obese (HCC) Body mass index is 54.28 kg/m.  -  Recent weight gain of 4-5 pounds. Decreased physical activity due to abdominal pain. Difficulty with protein intake and appetite control. - Encouraged increased protein intake and portion control.  Addendum 12/29/23 : CT abdomen showed mild diverticulitis involving the proximal descending colon.  No evidence of perforation, abscess, or other complication.  4. Diverticulitis  -  discussed with patient via telephone and will start on Cipro and Flagyl  - ciprofloxacin (CIPRO) 500 MG tablet; Take 1 tablet (500 mg total) by mouth 2 (two) times daily for 10 days.  Dispense: 20 tablet; Refill: 0 - metroNIDAZOLE  (FLAGYL ) 500 MG tablet; Take 1 tablet (500 mg total) by mouth 3 (three) times daily for 10 days.  Dispense: 30 tablet; Refill: 0  Labs/tests ordered:  POC urinalysis dipstick, urine culture   Return in about 4 weeks (around 01/20/2024).  Richard Ritchey Medina-Vargas, NP

## 2023-12-24 LAB — URINE CULTURE
MICRO NUMBER:: 17220534
Result:: NO GROWTH
SPECIMEN QUALITY:: ADEQUATE

## 2023-12-25 ENCOUNTER — Ambulatory Visit: Payer: Self-pay | Admitting: Adult Health

## 2023-12-25 NOTE — Progress Notes (Signed)
 Urine culture negative for UTI.

## 2023-12-26 ENCOUNTER — Ambulatory Visit
Admission: RE | Admit: 2023-12-26 | Discharge: 2023-12-26 | Disposition: A | Discharge status: Home / Self Care | Source: Ambulatory Visit | Attending: Adult Health | Admitting: Adult Health

## 2023-12-26 DIAGNOSIS — R1031 Right lower quadrant pain: Secondary | ICD-10-CM

## 2023-12-29 ENCOUNTER — Telehealth: Payer: Self-pay | Admitting: *Deleted

## 2023-12-29 MED ORDER — METRONIDAZOLE 500 MG PO TABS: 500.0000 mg | ORAL_TABLET | Freq: Three times a day (TID) | ORAL | 0 refills | Status: AC

## 2023-12-29 MED ORDER — CIPROFLOXACIN HCL 500 MG PO TABS: 500.0000 mg | ORAL_TABLET | Freq: Two times a day (BID) | ORAL | 0 refills | Status: DC

## 2023-12-29 NOTE — Telephone Encounter (Signed)
 Discussed CT of abdomen result with patient and treatment with Cipro and metronidazole . She agreed with the plan.

## 2023-12-29 NOTE — Telephone Encounter (Signed)
 Copied from CRM #[id number]. Topic: Clinical - Lab/Test Results >> Dec 29, 2023 11:23 AM Carrielelia G wrote: Reason for CRM:  Please call regarding results.

## 2023-12-29 NOTE — Progress Notes (Signed)
-    Discussed result with husband, patient was called and did not answer. Discussed with husband regarding Metronidazole  and Cipro. He verbalized understanding and will let wife know.

## 2023-12-29 NOTE — Telephone Encounter (Signed)
 Called patient back regarding CT Results but patient is wanting to speak with Monina directly. Patient requesting for you to call her at #[phone]

## 2023-12-30 ENCOUNTER — Encounter (INDEPENDENT_AMBULATORY_CARE_PROVIDER_SITE_OTHER): Payer: Self-pay | Admitting: Family Medicine

## 2023-12-30 ENCOUNTER — Ambulatory Visit (INDEPENDENT_AMBULATORY_CARE_PROVIDER_SITE_OTHER): Admitting: Family Medicine

## 2023-12-30 VITALS — BP 136/85 | HR 79 | Temp 98.2°F | Ht 65.0 in | Wt 317.0 lb

## 2023-12-30 DIAGNOSIS — E1165 Type 2 diabetes mellitus with hyperglycemia: Secondary | ICD-10-CM

## 2023-12-30 DIAGNOSIS — Z6841 Body Mass Index (BMI) 40.0 and over, adult: Secondary | ICD-10-CM

## 2023-12-30 DIAGNOSIS — E559 Vitamin D deficiency, unspecified: Secondary | ICD-10-CM | POA: Diagnosis not present

## 2023-12-30 DIAGNOSIS — E669 Obesity, unspecified: Secondary | ICD-10-CM

## 2023-12-30 DIAGNOSIS — Z7985 Long-term (current) use of injectable non-insulin antidiabetic drugs: Secondary | ICD-10-CM

## 2023-12-30 MED ORDER — CHOLECALCIFEROL 1.25 MG (50000 UT) PO CAPS: 50000.0000 [IU] | ORAL_CAPSULE | ORAL | 0 refills | Status: AC

## 2023-12-30 MED ORDER — TIRZEPATIDE 7.5 MG/0.5ML ~~LOC~~ SOAJ: 7.5000 mg | SUBCUTANEOUS | 0 refills | Status: DC

## 2023-12-30 NOTE — Progress Notes (Signed)
   SUBJECTIVE:  Chief Complaint: Obesity  Interim History: Patient has been hurting more so hasn't been Yu to walk as she was hoping to.  She had a CT scan for right lower quadrant pain.  She was found to have mild diverticulitis and mentions that she was started on antibiotics and told to incorporate a high fiber diet after that. She just started antibiotics today.  Planning to get together with the family to play games for Thanksgiving.   Taylin is here to discuss her progress with her obesity treatment plan. She is on the Category 3 Plan and states she is following her eating plan approximately 60 % of the time. She states she is not exercising.   OBJECTIVE: Visit Diagnoses: Problem List Items Addressed This Visit       Endocrine   DM (diabetes mellitus) (HCC) - Primary   Relevant Medications   tirzepatide  (MOUNJARO ) 7.5 MG/0.5ML Pen     Other   BMI 50.0-59.9, adult (HCC)   Relevant Medications   tirzepatide  (MOUNJARO ) 7.5 MG/0.5ML Pen   Vitamin D  deficiency   Relevant Medications   Cholecalciferol  1.25 MG (50000 UT) capsule   Obesity with starting BMI of 57.5   Relevant Medications   tirzepatide  (MOUNJARO ) 7.5 MG/0.5ML Pen    Vitals Temp: 98.2 F (36.8 C) BP: 136/85 Pulse Rate: 79 SpO2: 99 %   Anthropometric Measurements Height: 5' 5 (1.651 m) Weight: (!) 317 lb (143.8 kg) BMI (Calculated): 52.75 Weight at Last Visit: 321 lb Weight Gained Since Last Visit: 4 Starting Weight: 335 lb Total Weight Loss (lbs): 18 lb (8.165 kg)   Body Composition  Body Fat %: 54.2 % Fat Mass (lbs): 171.8 lbs Muscle Mass (lbs): 137.8 lbs Total Body Water (lbs): 98.2 lbs Visceral Fat Rating : 22   Other Clinical Data Today's Visit #: 17 Starting Date: 07/30/22 Comments: Cat 3     ASSESSMENT AND PLAN: Assessment & Plan Type 2 diabetes mellitus with hyperglycemia, without long-term current use of insulin  (HCC) Doing well on tirzepatide  7.5 mg weekly.  Patient reports  that she is still not always [place] to get all the food in.  Will continue current dose and refill sent to pharmacy today. Vitamin D  deficiency Tolerating prescription strength vitamin D  supplementation and needs refill today.  No side effects such as nausea, vomiting, or muscle weakness reported. BMI 50.0-59.9, adult (HCC)  Obesity with starting BMI of 57.6    Diet: Jakyiah is currently in the action stage of change. As such, her goal is to continue with weight loss efforts and has agreed to the Category 3 Plan.   Exercise:  All adults should avoid inactivity. Some activity is better than none, and adults who participate in any amount of physical activity, gain some health benefits.  Behavior Modification:  We discussed the following Behavioral Modification Strategies today: increasing lean protein intake, decreasing simple carbohydrates, increasing vegetables, increase high fiber foods, and holiday eating strategies.   Return in about 4 weeks (around 01/27/2024).   She was informed of the importance of frequent follow up visits to maximize her success with intensive lifestyle modifications for her multiple health conditions.  Attestation Statements:   Reviewed by clinician on day of visit: allergies, medications, problem list, medical history, surgical history, family history, social history, and previous encounter notes.    Adelita Cho, MD

## 2023-12-31 ENCOUNTER — Ambulatory Visit (INDEPENDENT_AMBULATORY_CARE_PROVIDER_SITE_OTHER): Payer: Self-pay | Admitting: Family Medicine

## 2024-01-05 ENCOUNTER — Ambulatory Visit: Admitting: Adult Health

## 2024-01-05 ENCOUNTER — Ambulatory Visit: Payer: Self-pay

## 2024-01-05 ENCOUNTER — Encounter: Payer: Self-pay | Admitting: Adult Health

## 2024-01-05 VITALS — BP 138/84 | HR 80 | Temp 97.6°F | Ht 65.0 in | Wt 321.2 lb

## 2024-01-05 DIAGNOSIS — K5792 Diverticulitis of intestine, part unspecified, without perforation or abscess without bleeding: Secondary | ICD-10-CM | POA: Diagnosis not present

## 2024-01-05 DIAGNOSIS — R197 Diarrhea, unspecified: Secondary | ICD-10-CM

## 2024-01-05 MED ORDER — AMOXICILLIN-POT CLAVULANATE 875-125 MG PO TABS: 1.0000 | ORAL_TABLET | Freq: Two times a day (BID) | ORAL | 0 refills | Status: AC

## 2024-01-05 NOTE — Progress Notes (Signed)
 [facility] clinic  Provider:  Jereld Serum DNP  Code Status:  Full Code  Goals of Care:     05/08/2023    9:30 AM  Advanced Directives  Does Patient Have a Medical Advance Directive? No  Would patient like information on creating a medical advance directive? No - Patient declined     Chief Complaint  Patient presents with   Diarrhea    Began with taking the cipro  along with nausea    Discussed the use of AI scribe software for clinical note transcription with the patient, who gave verbal consent to proceed.  HPI: Patient is a 63 y.o. female seen today for an acute visit for diarrhea after starting antibiotics.  She began experiencing diarrhea and nausea after starting ciprofloxacin  and metronidazole  (Flagyl ) five days ago for diverticulitis. The diarrhea commenced with the medication regimen, and she describes the nausea as 'the worst'. She has not taken any medication today due to these side effects.  She has been taking ciprofloxacin  daily for five days and metronidazole  500 mg three times a day for diverticulitis. The nausea is intolerable, while the diarrhea is less bothersome. She has not taken any medication for nausea and prefers to avoid additional pills.  Her stomach feels sore, similar to being 'a little gassy', but she denies significant pain. She reports that the pain in her right lower quadrant is much better and she is [place] to get up and move around, though her stomach still feels sore.  She has experienced a five-pound weight loss since her last visit, noting discrepancies between different scales but acknowledging the weight loss.    Past Medical History:  Diagnosis Date   Anemia    Arthritis    Back pain    Bilateral calcaneal spurs    Bilateral lower extremity edema    Chest pain    Diabetes (HCC)    Diabetes mellitus without complication (HCC)    type 2   Gallbladder problem    GERD (gastroesophageal reflux disease)    Gout    H/O blood clots     Heartburn    Hyperlipidemia    Hypertension    Obesity    Osteoarthritis    Palpitations    SOBOE (shortness of breath on exertion)    Swallowing difficulty    Type 2 diabetes mellitus (HCC)    Vitamin D  deficiency     Past Surgical History:  Procedure Laterality Date   ABDOMINAL HYSTERECTOMY     partial   CHOLECYSTECTOMY     COLONOSCOPY WITH PROPOFOL  N/A 10/29/2016   Procedure: COLONOSCOPY WITH PROPOFOL ;  Surgeon: Leigh Elspeth SQUIBB, MD;  Location: [facility];  Service: Gastroenterology;  Laterality: N/A;   COLONOSCOPY WITH PROPOFOL  N/A 10/01/2022   Procedure: COLONOSCOPY WITH PROPOFOL ;  Surgeon: Leigh Elspeth SQUIBB, MD;  Location: [facility];  Service: Gastroenterology;  Laterality: N/A;  BMI = 57   DENTAL SURGERY     ORIF TIBIA & FIBULA FRACTURES Left    POLYPECTOMY  10/01/2022   Procedure: POLYPECTOMY;  Surgeon: Leigh Elspeth SQUIBB, MD;  Location: [facility];  Service: Gastroenterology;;    Allergies  Allergen Reactions   Lisinopril  Swelling    Throat swelling   Hydrochlorothiazide      Other Reaction(s): sleepy   Tape Other (See Comments)    Certain Band aids cause skin irritation    Outpatient Encounter Medications as of 01/05/2024  Medication Sig   [EXPIRED] amoxicillin -clavulanate (AUGMENTIN ) 875-125 MG tablet Take 1 tablet by mouth 2 (  two) times daily for 5 days.   atenolol  (TENORMIN ) 50 MG tablet Take 0.5 tablets (25 mg total) by mouth daily.   atorvastatin  (LIPITOR) 40 MG tablet Take 1 tablet (40 mg total) by mouth daily.   Blood Glucose Monitoring Suppl (ACCU-CHEK GUIDE) w/Device KIT 1 Device by Does not apply route daily. CBG daily, pls give strips and lancets   chlorthalidone  (HYGROTON ) 25 MG tablet Take 0.5 tablets (12.5 mg total) by mouth every morning.   Cholecalciferol  1.25 MG (50000 UT) capsule Take 1 capsule (50,000 Units total) by mouth once a week.   glucose blood (ACCU-CHEK GUIDE) test strip Use as instructed   ibuprofen  (ADVIL ) 800 MG  tablet Take 1 tablet (800 mg total) by mouth 2 (two) times daily as needed. Take with food   metFORMIN  (GLUCOPHAGE ) 1000 MG tablet Take 1 tablet (1,000 mg total) by mouth 2 (two) times daily with a meal. (Patient taking differently: Take 1,000 mg by mouth 2 (two) times daily with a meal. Taking 1 tablet per day)   [EXPIRED] metroNIDAZOLE  (FLAGYL ) 500 MG tablet Take 1 tablet (500 mg total) by mouth 3 (three) times daily for 10 days.   olmesartan  (BENICAR ) 40 MG tablet Take 1 tablet (40 mg total) by mouth daily.   tirzepatide  (MOUNJARO ) 7.5 MG/0.5ML Pen Inject 7.5 mg into the skin once a week.   [DISCONTINUED] ciprofloxacin  (CIPRO ) 500 MG tablet Take 1 tablet (500 mg total) by mouth 2 (two) times daily for 10 days.   No facility-administered encounter medications on file as of 01/05/2024.    Review of Systems:  Review of Systems  Constitutional:  Negative for appetite change, chills, fatigue and fever.  HENT:  Negative for congestion, hearing loss, rhinorrhea and sore throat.   Eyes: Negative.   Respiratory:  Negative for cough, shortness of breath and wheezing.   Cardiovascular:  Negative for chest pain, palpitations and leg swelling.  Gastrointestinal:  Positive for diarrhea. Negative for abdominal pain, constipation, nausea and vomiting.  Genitourinary:  Negative for dysuria.  Musculoskeletal:  Negative for arthralgias, back pain and myalgias.  Skin:  Negative for color change, rash and wound.  Neurological:  Negative for dizziness, weakness and headaches.  Psychiatric/Behavioral:  Negative for behavioral problems. The patient is not nervous/anxious.     Health Maintenance  Topic Date Due   Zoster Vaccines- Shingrix (1 of 2) Never done   Hepatitis B Vaccines 19-59 Average Risk (2 of 3 - 19+ 3-dose series) 03/17/2015   OPHTHALMOLOGY EXAM  07/17/2023   COVID-19 Vaccine (4 - 2025-26 season) 10/13/2023   HEMOGLOBIN A1C  02/10/2024   Mammogram  08/04/2024   FOOT EXAM  08/07/2024    Diabetic kidney evaluation - Urine ACR  08/10/2024   Diabetic kidney evaluation - eGFR measurement  11/30/2024   Cervical Cancer Screening (HPV/Pap Cotest)  04/22/2028   DTaP/Tdap/Td (2 - Td or Tdap) 10/25/2028   Colonoscopy  09/30/2032   Pneumococcal Vaccine: 50+ Years  Completed   Influenza Vaccine  Completed   Hepatitis C Screening  Completed   HIV Screening  Completed   HPV VACCINES  Aged Out   Meningococcal B Vaccine  Aged Out    Physical Exam: Vitals:   01/05/24 1448  BP: 138/84  Pulse: 80  Temp: 97.6 F (36.4 C)  TempSrc: Temporal  SpO2: 98%  Weight: (!) 321 lb 3.2 oz (145.7 kg)  Height: 5' 5 (1.651 m)   Body mass index is 53.45 kg/m. Physical Exam Constitutional:  General: She is not in acute distress.    Appearance: She is obese.  HENT:     Head: Normocephalic and atraumatic.     Nose: Nose normal.     Mouth/Throat:     Mouth: Mucous membranes are moist.  Eyes:     Conjunctiva/sclera: Conjunctivae normal.  Cardiovascular:     Rate and Rhythm: Normal rate and regular rhythm.  Pulmonary:     Effort: Pulmonary effort is normal.     Breath sounds: Normal breath sounds.  Abdominal:     General: Bowel sounds are normal.     Palpations: Abdomen is soft.  Musculoskeletal:        General: Normal range of motion.     Cervical back: Normal range of motion.  Skin:    General: Skin is warm and dry.  Neurological:     General: No focal deficit present.     Mental Status: She is alert and oriented to person, place, and time.  Psychiatric:        Mood and Affect: Mood normal.        Behavior: Behavior normal.        Thought Content: Thought content normal.        Judgment: Judgment normal.     Labs reviewed: Basic Metabolic Panel: Recent Labs    05/08/23 1007 08/11/23 0814 12/01/23 1100  NA 140 139 141  K 4.2 4.2 3.8  CL 103 104 106  CO2 27 27 27   GLUCOSE 230* 226* 187*  BUN 17 17 14   CREATININE 0.75 0.83 0.78  CALCIUM  9.6 9.6 9.4   Liver  Function Tests: Recent Labs    01/24/23 0857 08/11/23 0814  AST 15 15  ALT 19 21  BILITOT 0.3 0.4  PROT 6.6 6.8   No results for input(s): LIPASE, AMYLASE in the last 8760 hours. No results for input(s): AMMONIA in the last 8760 hours. CBC: Recent Labs    01/24/23 0857 05/08/23 1007 12/01/23 1100  WBC 7.0 7.2 6.7  NEUTROABS 4,438 4,615 4,496  HGB 12.2 11.9 11.6*  HCT 38.7 36.3 35.9  MCV 94.4 89.9 92.8  PLT 250 235 226   Lipid Panel: Recent Labs    01/24/23 0857 05/08/23 1007 08/11/23 0814  CHOL 145 152 165  HDL 47* 49* 52  LDLCALC 82 84 96  TRIG 80 93 82  CHOLHDL 3.1 3.1 3.2   Lab Results  Component Value Date   HGBA1C 9.2 (H) 08/11/2023    Procedures since last visit: CT ABDOMEN PELVIS WO CONTRAST Result Date: 12/27/2023 CLINICAL DATA:  Right lower quadrant pain. EXAM: CT ABDOMEN AND PELVIS WITHOUT CONTRAST TECHNIQUE: Multidetector CT imaging of the abdomen and pelvis was performed following the standard protocol without IV contrast. RADIATION DOSE REDUCTION: This exam was performed according to the departmental dose-optimization program which includes automated exposure control, adjustment of the mA and/or kV according to patient size and/or use of iterative reconstruction technique. COMPARISON:  None Available. FINDINGS: Lower chest: No acute findings. Hepatobiliary: No mass visualized on this unenhanced exam. Prior cholecystectomy. No evidence of biliary obstruction. Pancreas: No mass or inflammatory process visualized on this unenhanced exam. Spleen:  Within normal limits in size. Adrenals/Urinary tract: No evidence of urolithiasis or hydronephrosis. Unremarkable unopacified urinary bladder. Stomach/Bowel: Normal appendix visualized. Diffuse colonic diverticulosis is noted. Mild wall thickening and pericolonic inflammatory change is seen involving the proximal descending colon, consistent with mild diverticulitis. No No evidence of perforation or abscess.  Vascular/Lymphatic: No pathologically enlarged  lymph nodes identified. No evidence of abdominal aortic aneurysm. Reproductive: Prior hysterectomy noted. Adnexal regions are unremarkable in appearance. Other:  None. Musculoskeletal:  No suspicious bone lesions identified. IMPRESSION: Mild diverticulitis involving the proximal descending colon. No evidence of perforation, abscess, or other complication. Electronically Signed   By: Norleen DELENA Kil M.D.   On: 12/27/2023 12:59    Assessment/Plan  1. Diarrhea, unspecified type (Primary) -  Diarrhea and nausea associated with Ciprofloxacin  use. Augmentin  expected to have a different side effect profile. - Discontinued Ciprofloxacin  due to severe nausea and diarrhea. - Prescribed Augmentin  (amoxicillin /clavulanate) as an alternative antibiotic.  2. Diverticulitis -  Acute diverticulitis with right lower quadrant pain, improved but with residual soreness. Ciprofloxacin  caused significant nausea and diarrhea, leading to discontinuation. Augmentin  considered as an alternative due to different side effect profile. - Discontinued Ciprofloxacin . - Prescribed Augmentin  (amoxicillin /clavulanate) 875 mg twice daily for 5 days. - Continue Metronidazole  (Flagyl ) 500 mg three times daily for 10 days. - Advised increased dietary fiber intake post-treatment to prevent recurrence. - amoxicillin -clavulanate (AUGMENTIN ) 875-125 MG tablet; Take 1 tablet by mouth 2 (two) times daily for 5 days.  Dispense: 10 tablet; Refill: 0  Labs/tests ordered:  None     Return if symptoms worsen or fail to improve.  Damond Borchers Medina-Vargas, NP

## 2024-01-05 NOTE — Telephone Encounter (Addendum)
 FYI Only or Action Required?: FYI only for provider: appointment scheduled on 11/24.  Patient was last seen in primary care on 12/30/2023 by [place] Adelita PENNER, MD.  Called Nurse Triage reporting Diarrhea.  Symptoms began a week ago.  Interventions attempted: Prescription medications: Cipro  and Flagyl  .  Symptoms are: gradually worsening.  Triage Disposition: See Physician Within 24 Hours  Patient/caregiver understands and will follow disposition?: Yes  Copied from CRM #[id number]. Topic: Clinical - Medical Advice >> Jan 05, 2024  8:14 AM Rosaria A wrote: Reason for CRM: Patient was prescribed metroNIDAZOLE  (FLAGYL ) 500 MG tablet and ciprofloxacin  (CIPRO ) 500 MG tablet. Patient states the medication is making her real sick. She is having diarrhea and is having nausea. Patient is wanting to know if their is something else she can take. Please call patient back. Reason for Disposition  MODERATE diarrhea (e.g., 4-6 times / day more than normal)  Answer Assessment - Initial Assessment Questions Nausea the first 2 days after   1. ANTIBIOTIC: What antibiotic are you taking? How many times per day?     Cipro  and Flagyl  2. ANTIBIOTIC ONSET: When was the antibiotic started?     11/17 3. DIARRHEA SEVERITY: How bad is the diarrhea? How many more stools have you had in the past 24 hours than normal?      3-4times day if not more 4. ONSET: When did the diarrhea begin?      2-3 days ago 5. BM CONSISTENCY: How loose or watery is the diarrhea?      Sandy, loose, watery  6. VOMITING: Are you also vomiting? If Yes, ask: How many times in the past 24 hours?      Greasy throw up- spit up in her mouth  7. ABDOMEN PAIN: Are you having any abdomen pain? If Yes, ask: What does it feel like? (e.g., crampy, dull, intermittent, constant)      Achy, cramping but not painful- sore 8. ABDOMEN PAIN SEVERITY: If present, ask: How bad is the pain?  (e.g., Scale 1-10; mild, moderate, or  severe)     Denies pain but sore and achy  9. ORAL INTAKE: If vomiting, Have you been [place] to drink liquids? How much liquids have you had in the past 24 hours?     Trying to increase her water intake  10. HYDRATION: Any signs of dehydration? (e.g., dry mouth [not just dry lips], too weak to stand, dizziness, new weight loss) When did you last urinate?       Feels thirstier but otherwise no Sx 11. EXPOSURE: Have you traveled to a foreign country recently? Have you been exposed to anyone with diarrhea? Could you have eaten any food that was spoiled?       Husband with some resp symptoms but no GI  12. OTHER SYMPTOMS: Do you have any other symptoms? (e.g., fever, blood in stool)       denies  Protocols used: Diarrhea on Antibiotics-A-AH

## 2024-01-08 NOTE — Assessment & Plan Note (Signed)
 Tolerating prescription strength vitamin D  supplementation and needs refill today.  No side effects such as nausea, vomiting, or muscle weakness reported.

## 2024-01-08 NOTE — Assessment & Plan Note (Signed)
 Doing well on tirzepatide  7.5 mg weekly.  Patient reports that she is still not always [place] to get all the food in.  Will continue current dose and refill sent to pharmacy today.

## 2024-01-20 ENCOUNTER — Ambulatory Visit: Admitting: Adult Health

## 2024-01-23 ENCOUNTER — Other Ambulatory Visit

## 2024-01-23 ENCOUNTER — Encounter: Admitting: Adult Health

## 2024-01-24 LAB — HEMOGLOBIN A1C
Hgb A1c MFr Bld: 7.9 % — ABNORMAL HIGH (ref <5.7)
Mean Plasma Glucose: 180 mg/dL
eAG (mmol/L): 10 mmol/L

## 2024-01-24 LAB — COMPLETE METABOLIC PANEL WITHOUT GFR
AG Ratio: 1.2 (calc) (ref 1.0–2.5)
ALT: 16 U/L (ref 6–29)
AST: 15 U/L (ref 10–35)
Albumin: 3.8 g/dL (ref 3.6–5.1)
Alkaline phosphatase (APISO): 89 U/L (ref 37–153)
BUN: 18 mg/dL (ref 7–25)
CO2: 28 mmol/L (ref 20–32)
Calcium: 10.1 mg/dL (ref 8.6–10.4)
Chloride: 103 mmol/L (ref 98–110)
Creat: 0.79 mg/dL (ref 0.50–1.05)
Globulin: 3.1 g/dL (ref 1.9–3.7)
Glucose, Bld: 170 mg/dL — ABNORMAL HIGH (ref 65–99)
Potassium: 4.2 mmol/L (ref 3.5–5.3)
Sodium: 139 mmol/L (ref 135–146)
Total Bilirubin: 0.5 mg/dL (ref 0.2–1.2)
Total Protein: 6.9 g/dL (ref 6.1–8.1)

## 2024-01-24 LAB — LIPID PANEL
Cholesterol: 126 mg/dL (ref <200)
HDL: 50 mg/dL (ref >=50)
LDL Cholesterol (Calc): 61 mg/dL
Non-HDL Cholesterol (Calc): 76 mg/dL (ref <130)
Total CHOL/HDL Ratio: 2.5 (calc) (ref <5.0)
Triglycerides: 72 mg/dL (ref <150)

## 2024-01-24 LAB — MICROALBUMIN / CREATININE URINE RATIO
Creatinine, Urine: 100 mg/dL (ref 20–275)
Microalb Creat Ratio: 9 mg/g{creat} (ref <30)
Microalb, Ur: 0.9 mg/dL

## 2024-01-26 ENCOUNTER — Ambulatory Visit (INDEPENDENT_AMBULATORY_CARE_PROVIDER_SITE_OTHER): Admitting: Family Medicine

## 2024-01-26 VITALS — BP 129/67 | HR 83 | Temp 98.1°F | Ht 65.0 in | Wt 317.0 lb

## 2024-01-26 DIAGNOSIS — Z7985 Long-term (current) use of injectable non-insulin antidiabetic drugs: Secondary | ICD-10-CM | POA: Diagnosis not present

## 2024-01-26 DIAGNOSIS — Z6841 Body Mass Index (BMI) 40.0 and over, adult: Secondary | ICD-10-CM

## 2024-01-26 DIAGNOSIS — E1159 Type 2 diabetes mellitus with other circulatory complications: Secondary | ICD-10-CM | POA: Diagnosis not present

## 2024-01-26 DIAGNOSIS — E1165 Type 2 diabetes mellitus with hyperglycemia: Secondary | ICD-10-CM

## 2024-01-26 DIAGNOSIS — E669 Obesity, unspecified: Secondary | ICD-10-CM | POA: Diagnosis not present

## 2024-01-26 DIAGNOSIS — I152 Hypertension secondary to endocrine disorders: Secondary | ICD-10-CM

## 2024-01-26 DIAGNOSIS — I10 Essential (primary) hypertension: Secondary | ICD-10-CM

## 2024-01-26 MED ORDER — ATENOLOL 50 MG PO TABS: 25.0000 mg | ORAL_TABLET | Freq: Every day | ORAL | 0 refills | Status: AC

## 2024-01-26 MED ORDER — TIRZEPATIDE 10 MG/0.5ML ~~LOC~~ SOAJ: 10.0000 mg | SUBCUTANEOUS | 1 refills | Status: DC

## 2024-01-26 NOTE — Progress Notes (Signed)
 "  SUBJECTIVE:  Chief Complaint: Obesity  Interim History: Patient had an enjoyable Thanksgiving and got to enjoy her family.  Aside from Thanksgiving she has been about the same food wise.  She mentions she doesn't think she is eating much in terms of sweets.  She was started on medication for diverticulitis and did require an antibiotic change while she was undergoing treatment. Over the next few weeks she doesn't have much planned- likely will be home for the upcoming holidays.  Ruth [name] is here to discuss her progress with her obesity treatment plan. She is on the Category 3 Plan and states she is following her eating plan approximately 60-70 % of the time. She states she is not exercising.   OBJECTIVE: Visit Diagnoses: Problem List Items Addressed This Visit       Cardiovascular and Mediastinum   Hypertension associated with diabetes (HCC)   Relevant Medications   tirzepatide  (MOUNJARO ) 10 MG/0.5ML Pen   atenolol  (TENORMIN ) 50 MG tablet     Endocrine   DM (diabetes mellitus) (HCC) - Primary   Relevant Medications   tirzepatide  (MOUNJARO ) 10 MG/0.5ML Pen     Other   BMI 50.0-59.9, adult (HCC)   Relevant Medications   tirzepatide  (MOUNJARO ) 10 MG/0.5ML Pen   Obesity with starting BMI of 57.5   Relevant Medications   tirzepatide  (MOUNJARO ) 10 MG/0.5ML Pen    Vitals Temp: 98.1 F (36.7 C) BP: 129/67 Pulse Rate: 83 SpO2: 100 %   Anthropometric Measurements Height: 5' 5 (1.651 m) Weight: (!) 317 lb (143.8 kg) BMI (Calculated): 52.75 Weight at Last Visit: 317 lb Weight Lost Since Last Visit: 0 Weight Gained Since Last Visit: 0 Starting Weight: 335 lb Total Weight Loss (lbs): 18 lb (8.165 kg)   Body Composition  Body Fat %: 54 % Fat Mass (lbs): 171.2 lbs Muscle Mass (lbs): 138.4 lbs Total Body Water (lbs): 97.4 lbs Visceral Fat Rating : 22   Other Clinical Data Today's Visit #: 18 Starting Date: 07/30/22 Comments: Cat 3     ASSESSMENT AND  PLAN: Assessment & Plan Type 2 diabetes mellitus with hyperglycemia, without long-term current use of insulin  (HCC) Patient on Mounjaro  with no significant GI side effects.  Needs a refill today.  Will increase tirzepatide  up to 10 mg from 7.5.  Discussed possibility of side effects at increasing dosage. Hypertension associated with diabetes (HCC) Blood pressure well-controlled today needs a refill of atenolol  today.  No change in dosage.  Follow-up blood pressure at next appointments. BMI 50.0-59.9, adult (HCC)  Obesity with starting BMI of 57.6    Diet: Breda is currently in the action stage of change. As such, her goal is to continue with weight loss efforts and has agreed to the Category 3 Plan.   Exercise:  For additional and more extensive health benefits, adults should increase their aerobic physical activity to 300 minutes (5 hours) a week of moderate-intensity, or 150 minutes a week of vigorous-intensity aerobic physical activity, or an equivalent combination of moderate- and vigorous-intensity activity. Additional health benefits are gained by engaging in physical activity beyond this amount.   Behavior Modification:  We discussed the following Behavioral Modification Strategies today: increasing lean protein intake, decreasing simple carbohydrates, better snacking choices, and holiday eating strategies. We discussed various medication options to help [name] with her weight loss efforts and we both agreed to increase mounjaro  to 10mg  weekly.  Return in about 6 weeks (around 03/08/2024).   She was informed of the importance of frequent follow  up visits to maximize her success with intensive lifestyle modifications for her multiple health conditions.  Attestation Statements:   Reviewed by clinician on day of visit: allergies, medications, problem list, medical history, surgical history, family history, social history, and previous encounter notes.    Adelita Cho, MD "

## 2024-01-26 NOTE — Progress Notes (Signed)
-    electrolytes, liver enzymes, urine microalbumin creatinine ratio, lipid panel, all normal -  A1C 7.9, down from 9.2 ( 08/11/23), continue current medications

## 2024-01-27 ENCOUNTER — Ambulatory Visit (INDEPENDENT_AMBULATORY_CARE_PROVIDER_SITE_OTHER): Admitting: Family Medicine

## 2024-02-01 NOTE — Progress Notes (Signed)
 This encounter was created in error - please disregard.

## 2024-02-08 NOTE — Assessment & Plan Note (Signed)
 Patient on Mounjaro  with no significant GI side effects.  Needs a refill today.  Will increase tirzepatide  up to 10 mg from 7.5.  Discussed possibility of side effects at increasing dosage.

## 2024-02-08 NOTE — Assessment & Plan Note (Signed)
 Blood pressure well-controlled today needs a refill of atenolol  today.  No change in dosage.  Follow-up blood pressure at next appointments.

## 2024-02-20 ENCOUNTER — Ambulatory Visit: Admitting: Adult Health

## 2024-02-20 ENCOUNTER — Encounter: Payer: Self-pay | Admitting: Adult Health

## 2024-02-20 VITALS — BP 128/74 | HR 76 | Temp 97.1°F | Ht 65.0 in | Wt 321.6 lb

## 2024-02-20 DIAGNOSIS — Z7985 Long-term (current) use of injectable non-insulin antidiabetic drugs: Secondary | ICD-10-CM

## 2024-02-20 DIAGNOSIS — Z6841 Body Mass Index (BMI) 40.0 and over, adult: Secondary | ICD-10-CM

## 2024-02-20 DIAGNOSIS — E1165 Type 2 diabetes mellitus with hyperglycemia: Secondary | ICD-10-CM

## 2024-02-20 DIAGNOSIS — I152 Hypertension secondary to endocrine disorders: Secondary | ICD-10-CM

## 2024-02-20 LAB — GLUCOSE, POCT (MANUAL RESULT ENTRY): POC Glucose: 204 mg/dL — AB (ref 70–99)

## 2024-02-20 MED ORDER — SITAGLIPTIN PHOSPHATE 50 MG PO TABS: 50.0000 mg | ORAL_TABLET | Freq: Every day | ORAL | 1 refills | Status: DC

## 2024-02-20 NOTE — Progress Notes (Unsigned)
 "  [facility]  Provider:  Jereld Serum DNP  Code Status: ***  Goals of Care:     05/08/2023    9:30 AM  Advanced Directives  Does Patient Have a Medical Advance Directive? No  Would patient like information on creating a medical advance directive? No - Patient declined     Chief Complaint  Patient presents with   Medical Management of Chronic Issues    Metformin  makes patient sick     HPI: Patient is a 64 y.o. female seen today for an acute visit for Discussed the use of AI scribe software for clinical note transcription with the patient, who gave verbal consent to proceed.  History of Present Illness      Past Medical History:  Diagnosis Date   Anemia    Arthritis    Back pain    Bilateral calcaneal spurs    Bilateral lower extremity edema    Chest pain    Diabetes (HCC)    Diabetes mellitus without complication (HCC)    type 2   Gallbladder problem    GERD (gastroesophageal reflux disease)    Gout    H/O blood clots    Heartburn    Hyperlipidemia    Hypertension    Obesity    Osteoarthritis    Palpitations    SOBOE (shortness of breath on exertion)    Swallowing difficulty    Type 2 diabetes mellitus (HCC)    Vitamin D  deficiency     Past Surgical History:  Procedure Laterality Date   ABDOMINAL HYSTERECTOMY     partial   CHOLECYSTECTOMY     COLONOSCOPY WITH PROPOFOL  N/A 10/29/2016   Procedure: COLONOSCOPY WITH PROPOFOL ;  Surgeon: Leigh Elspeth SQUIBB, MD;  Location: [facility];  Service: Gastroenterology;  Laterality: N/A;   COLONOSCOPY WITH PROPOFOL  N/A 10/01/2022   Procedure: COLONOSCOPY WITH PROPOFOL ;  Surgeon: Leigh Elspeth SQUIBB, MD;  Location: [facility];  Service: Gastroenterology;  Laterality: N/A;  BMI = 57   DENTAL SURGERY     ORIF TIBIA & FIBULA FRACTURES Left    POLYPECTOMY  10/01/2022   Procedure: POLYPECTOMY;  Surgeon: Leigh Elspeth SQUIBB, MD;  Location: [facility];  Service: Gastroenterology;;     Allergies[1]  Outpatient Encounter Medications as of 02/20/2024  Medication Sig   atenolol  (TENORMIN ) 50 MG tablet Take 0.5 tablets (25 mg total) by mouth daily.   atorvastatin  (LIPITOR) 40 MG tablet Take 1 tablet (40 mg total) by mouth daily.   Blood Glucose Monitoring Suppl (ACCU-CHEK GUIDE) w/Device KIT 1 Device by Does not apply route daily. CBG daily, pls give strips and lancets   chlorthalidone  (HYGROTON ) 25 MG tablet Take 0.5 tablets (12.5 mg total) by mouth every morning.   Cholecalciferol  1.25 MG (50000 UT) capsule Take 1 capsule (50,000 Units total) by mouth once a week.   glucose blood (ACCU-CHEK GUIDE) test strip Use as instructed   ibuprofen  (ADVIL ) 800 MG tablet Take 1 tablet (800 mg total) by mouth 2 (two) times daily as needed. Take with food   metFORMIN  (GLUCOPHAGE ) 1000 MG tablet Take 1 tablet (1,000 mg total) by mouth 2 (two) times daily with a meal. (Patient taking differently: Take 1,000 mg by mouth 2 (two) times daily with a meal. Taking 1 tablet per day)   olmesartan  (BENICAR ) 40 MG tablet Take 1 tablet (40 mg total) by mouth daily.   tirzepatide  (MOUNJARO ) 10 MG/0.5ML Pen Inject 10 mg into the skin once a week.   No facility-administered encounter  medications on file as of 02/20/2024.    Review of Systems:  Review of Systems  Constitutional:  Negative for appetite change, chills, fatigue and fever.  HENT:  Negative for congestion, hearing loss, rhinorrhea and sore throat.   Eyes: Negative.   Respiratory:  Negative for cough, shortness of breath and wheezing.   Cardiovascular:  Negative for chest pain, palpitations and leg swelling.  Gastrointestinal:  Positive for nausea. Negative for abdominal pain, constipation, diarrhea and vomiting.  Genitourinary:  Negative for dysuria.  Musculoskeletal:  Negative for arthralgias, back pain and myalgias.  Skin:  Negative for color change, rash and wound.  Neurological:  Negative for dizziness, weakness and headaches.   Psychiatric/Behavioral:  Negative for behavioral problems. The patient is not nervous/anxious.     Health Maintenance  Topic Date Due   Zoster Vaccines- Shingrix (1 of 2) Never done   Hepatitis B Vaccines 19-59 Average Risk (2 of 3 - 19+ 3-dose series) 03/17/2015   OPHTHALMOLOGY EXAM  07/17/2023   COVID-19 Vaccine (4 - 2025-26 season) 03/07/2024 (Originally 10/13/2023)   HEMOGLOBIN A1C  07/23/2024   Mammogram  08/04/2024   FOOT EXAM  08/07/2024   Diabetic kidney evaluation - eGFR measurement  01/22/2025   Diabetic kidney evaluation - Urine ACR  01/22/2025   Cervical Cancer Screening (HPV/Pap Cotest)  04/22/2028   DTaP/Tdap/Td (2 - Td or Tdap) 10/25/2028   Colonoscopy  09/30/2032   Pneumococcal Vaccine: 50+ Years  Completed   Influenza Vaccine  Completed   Hepatitis C Screening  Completed   HIV Screening  Completed   HPV VACCINES  Aged Out   Meningococcal B Vaccine  Aged Out    Physical Exam: Vitals:   02/20/24 0935  BP: 128/74  Pulse: 76  Temp: (!) 97.1 F (36.2 C)  TempSrc: Temporal  SpO2: 97%  Weight: (!) 321 lb 9.6 oz (145.9 kg)  Height: 5' 5 (1.651 m)   Body mass index is 53.52 kg/m. Physical Exam Constitutional:      Appearance: She is obese.  HENT:     Head: Normocephalic and atraumatic.     Nose: Nose normal.     Mouth/Throat:     Mouth: Mucous membranes are moist.  Eyes:     Conjunctiva/sclera: Conjunctivae normal.  Cardiovascular:     Rate and Rhythm: Normal rate and regular rhythm.  Pulmonary:     Effort: Pulmonary effort is normal.     Breath sounds: Normal breath sounds.  Abdominal:     General: Bowel sounds are normal.     Palpations: Abdomen is soft.  Musculoskeletal:        General: Normal range of motion.     Cervical back: Normal range of motion.  Skin:    General: Skin is warm and dry.  Neurological:     General: No focal deficit present.     Mental Status: She is alert and oriented to person, place, and time.  Psychiatric:         Mood and Affect: Mood normal.        Behavior: Behavior normal.        Thought Content: Thought content normal.        Judgment: Judgment normal.     Labs reviewed: Basic Metabolic Panel: Recent Labs    08/11/23 0814 12/01/23 1100 01/23/24 0914  NA 139 141 139  K 4.2 3.8 4.2  CL 104 106 103  CO2 27 27 28   GLUCOSE 226* 187* 170*  BUN 17 14 18  CREATININE 0.83 0.78 0.79  CALCIUM  9.6 9.4 10.1   Liver Function Tests: Recent Labs    08/11/23 0814 01/23/24 0914  AST 15 15  ALT 21 16  BILITOT 0.4 0.5  PROT 6.8 6.9   No results for input(s): LIPASE, AMYLASE in the last 8760 hours. No results for input(s): AMMONIA in the last 8760 hours. CBC: Recent Labs    05/08/23 1007 12/01/23 1100  WBC 7.2 6.7  NEUTROABS 4,615 4,496  HGB 11.9 11.6*  HCT 36.3 35.9  MCV 89.9 92.8  PLT 235 226   Lipid Panel: Recent Labs    05/08/23 1007 08/11/23 0814 01/23/24 0914  CHOL 152 165 126  HDL 49* 52 50  LDLCALC 84 96 61  TRIG 93 82 72  CHOLHDL 3.1 3.2 2.5   Lab Results  Component Value Date   HGBA1C 7.9 (H) 01/23/2024    Procedures since last visit: No results found.  Assessment/Plan     Labs/tests ordered:  * No order type specified *   No follow-ups on file.  Evangaline Jou Medina-Vargas, NP     [1]  Allergies Allergen Reactions   Lisinopril  Swelling    Throat swelling   Hydrochlorothiazide      Other Reaction(s): sleepy   Tape Other (See Comments)    Certain Band aids cause skin irritation   "

## 2024-02-25 ENCOUNTER — Telehealth: Payer: Self-pay

## 2024-02-25 DIAGNOSIS — E1169 Type 2 diabetes mellitus with other specified complication: Secondary | ICD-10-CM

## 2024-02-25 MED ORDER — ACCU-CHEK GUIDE TEST VI STRP: 1.0000 | ORAL_STRIP | Freq: Every day | 12 refills | Status: DC

## 2024-02-25 MED ORDER — ACCU-CHEK GUIDE W/DEVICE KIT: 1.0000 | PACK | Freq: Every day | 0 refills | Status: DC

## 2024-02-25 NOTE — Telephone Encounter (Signed)
 1.) Spoke with patient, informed her Januvia  50 mg was sent on 02/20/24. Patient states the pharmacy said they did not have it and it would need an approval. I called pharmacy and rx was received, however the pharmacy tech stated that medications needs a prior authorization. (Will forward to PA Team)  2.) New scripts sent to [facility] for diabetic testing supplies

## 2024-02-25 NOTE — Telephone Encounter (Signed)
 Copied from CRM #[id number]. Topic: Clinical - Order For Equipment >> Feb 25, 2024  8:56 AM Graeme ORN wrote: Reason for CRM: Patient called. States pharmacy called her to call about medication needed approval. Unsure of Medication for diabetes that was changed from Metformin  by [facility] - not [facility] to fill, needs approval. Also requesting new glucose meter - States she currently uses accucheck but has been along time since she got a new meter. Thank You

## 2024-02-27 ENCOUNTER — Telehealth: Payer: Self-pay

## 2024-02-27 ENCOUNTER — Other Ambulatory Visit (HOSPITAL_COMMUNITY): Payer: Self-pay

## 2024-02-27 NOTE — Telephone Encounter (Signed)
 PA request has been Submitted. New Encounter has been or will be created for follow up. For additional info see Pharmacy Prior Auth telephone encounter from 02-27-2024.

## 2024-02-27 NOTE — Telephone Encounter (Signed)
 Pharmacy Patient Advocate Encounter   Received notification from Pt Calls Messages that prior authorization for}Januvia  50 mg is required/requested.   Insurance verification completed.   The patient is insured through [facility] MEDICAID.   Per test claim: PA required; PA submitted to above mentioned insurance via Latent Key/confirmation #/EOC [id number] Status is pending

## 2024-02-27 NOTE — Telephone Encounter (Signed)
 Pharmacy Patient Advocate Encounter  Received notification from [facility] MEDICAID that Prior Authorization forJanuvia 50 mg has been APPROVED from 02/27/2024 to 02/26/2025. Ran test claim, Copay is $4.00. This test claim was processed through [facility]- copay amounts may vary at other pharmacies due to pharmacy/plan contracts, or as the patient moves through the different stages of their insurance plan.   PA #/Case ID/Reference #: [id number]

## 2024-03-02 ENCOUNTER — Other Ambulatory Visit: Payer: Self-pay | Admitting: Adult Health

## 2024-03-02 DIAGNOSIS — I10 Essential (primary) hypertension: Secondary | ICD-10-CM

## 2024-03-04 ENCOUNTER — Encounter (INDEPENDENT_AMBULATORY_CARE_PROVIDER_SITE_OTHER): Payer: Self-pay | Admitting: Family Medicine

## 2024-03-04 ENCOUNTER — Other Ambulatory Visit: Payer: Self-pay

## 2024-03-04 ENCOUNTER — Ambulatory Visit (INDEPENDENT_AMBULATORY_CARE_PROVIDER_SITE_OTHER): Admitting: Family Medicine

## 2024-03-04 VITALS — BP 133/77 | HR 83 | Temp 98.3°F | Ht 65.0 in | Wt 317.0 lb

## 2024-03-04 DIAGNOSIS — Z7985 Long-term (current) use of injectable non-insulin antidiabetic drugs: Secondary | ICD-10-CM | POA: Diagnosis not present

## 2024-03-04 DIAGNOSIS — E1159 Type 2 diabetes mellitus with other circulatory complications: Secondary | ICD-10-CM | POA: Diagnosis not present

## 2024-03-04 DIAGNOSIS — I10 Essential (primary) hypertension: Secondary | ICD-10-CM

## 2024-03-04 DIAGNOSIS — E669 Obesity, unspecified: Secondary | ICD-10-CM | POA: Diagnosis not present

## 2024-03-04 DIAGNOSIS — Z6841 Body Mass Index (BMI) 40.0 and over, adult: Secondary | ICD-10-CM

## 2024-03-04 DIAGNOSIS — E1165 Type 2 diabetes mellitus with hyperglycemia: Secondary | ICD-10-CM | POA: Diagnosis not present

## 2024-03-04 DIAGNOSIS — I152 Hypertension secondary to endocrine disorders: Secondary | ICD-10-CM

## 2024-03-04 MED ORDER — TIRZEPATIDE 10 MG/0.5ML ~~LOC~~ SOAJ: 10.0000 mg | SUBCUTANEOUS | 1 refills | Status: AC

## 2024-03-04 MED ORDER — ACCU-CHEK SOFTCLIX LANCETS MISC: 12 refills | Status: DC

## 2024-03-04 NOTE — Telephone Encounter (Signed)
 Rx refill that was requested by patient's pharmacy through [employer] has been sent to the pharmacy.

## 2024-03-04 NOTE — Progress Notes (Signed)
 "  SUBJECTIVE:  Chief Complaint: Obesity  Interim History: patient mentions she had a good holiday season- she mostly stayed home but visited her kids.  No out of town trips.  Foodwise she has been taking in more peanut butter than she did previously.  She is eating a bit more teriyaki vegetables.  She has been a no meat fast this month.  She started walking more recently but this week hasn't been walking.  Want to start chair exercises. Hasn't had mounjaro  yet. She was recently started on Januvia  but hasn't taken it yet.   Ruth [name] is here to discuss her progress with her obesity treatment plan. She is on the Category 3 Plan and states she is following her eating plan approximately 65 % of the time. She states she is walking some.    OBJECTIVE: Visit Diagnoses: Problem List Items Addressed This Visit       Cardiovascular and Mediastinum   Hypertension associated with diabetes (HCC)   Relevant Medications   tirzepatide  (MOUNJARO ) 10 MG/0.5ML Pen     Endocrine   DM (diabetes mellitus) (HCC) - Primary   Relevant Medications   tirzepatide  (MOUNJARO ) 10 MG/0.5ML Pen     Other   BMI 50.0-59.9, adult (HCC)   Relevant Medications   tirzepatide  (MOUNJARO ) 10 MG/0.5ML Pen   Obesity with starting BMI of 57.5   Relevant Medications   tirzepatide  (MOUNJARO ) 10 MG/0.5ML Pen    Vitals Temp: 98.3 F (36.8 C) BP: 133/77 Pulse Rate: 83 SpO2: 97 %   Anthropometric Measurements Height: 5' 5 (1.651 m) Weight: (!) 317 lb (143.8 kg) BMI (Calculated): 52.75 Weight at Last Visit: 317 lb Weight Lost Since Last Visit: 0 Weight Gained Since Last Visit: 0 Starting Weight: 335 lb Total Weight Loss (lbs): 18 lb (8.165 kg)   Body Composition  Body Fat %: 54.4 % Fat Mass (lbs): 172.6 lbs Muscle Mass (lbs): 137.6 lbs Total Body Water (lbs): 98.6 lbs Visceral Fat Rating : 22   Other Clinical Data Today's Visit #: 19 Starting Date: 07/30/22 Comments: Cat 3     ASSESSMENT AND  PLAN: Assessment & Plan Type 2 diabetes mellitus with hyperglycemia, without long-term current use of insulin  (HCC) Patient is experiencing some effects of satiety of tirzepatide  and still has significant sugar cravings despite its usage.  Will refill tirzepatide  at 10 mg and refill sent to pharmacy today.  Discussed again the importance of protein intake as well as total nutrition intake throughout the day so that patient can hit nutritional goals which will keep her macros within the ratio that we have previously discussed. Hypertension associated with diabetes (HCC) Blood pressure well-controlled today.  No medication refills needed at this time.  Continue current treatment plan and current medications.  No chest pain, chest pressure, or headache reported. BMI 50.0-59.9, adult (HCC)  Obesity with starting BMI of 57.6    Diet: Ruth [name] is currently in the action stage of change. As such, her goal is to continue with weight loss efforts and has agreed to the Category 3 Plan.   Exercise:  For substantial health benefits, adults should do at least 150 minutes (2 hours and 30 minutes) a week of moderate-intensity, or 75 minutes (1 hour and 15 minutes) a week of vigorous-intensity aerobic physical activity, or an equivalent combination of moderate- and vigorous-intensity aerobic activity. Aerobic activity should be performed in episodes of at least 10 minutes, and preferably, it should be spread throughout the week.  Behavior Modification:  We discussed the  following Behavioral Modification Strategies today: increasing lean protein intake, decreasing simple carbohydrates, increasing vegetables, no skipping meals, meal planning and cooking strategies, and planning for success.   Return in about 5 weeks (around 04/08/2024).   She was informed of the importance of frequent follow up visits to maximize her success with intensive lifestyle modifications for her multiple health conditions.  Attestation  Statements:   Reviewed by clinician on day of visit: allergies, medications, problem list, medical history, surgical history, family history, social history, and previous encounter notes.     Ruth Cho, MD "

## 2024-03-08 ENCOUNTER — Ambulatory Visit (INDEPENDENT_AMBULATORY_CARE_PROVIDER_SITE_OTHER): Admitting: Family Medicine

## 2024-03-09 ENCOUNTER — Encounter: Payer: Self-pay | Admitting: Adult Health

## 2024-03-09 ENCOUNTER — Other Ambulatory Visit: Payer: Self-pay | Admitting: Family

## 2024-03-09 DIAGNOSIS — E1169 Type 2 diabetes mellitus with other specified complication: Secondary | ICD-10-CM

## 2024-03-09 MED ORDER — ACCU-CHEK SOFTCLIX LANCETS MISC: 12 refills | Status: AC

## 2024-03-09 MED ORDER — ACCU-CHEK GUIDE W/DEVICE KIT: 1.0000 | PACK | Freq: Every day | 0 refills | Status: AC

## 2024-03-09 MED ORDER — ACCU-CHEK GUIDE TEST VI STRP: 1.0000 | ORAL_STRIP | Freq: Every day | 12 refills | Status: AC

## 2024-03-10 ENCOUNTER — Other Ambulatory Visit: Payer: Self-pay | Admitting: Family

## 2024-03-10 DIAGNOSIS — E1165 Type 2 diabetes mellitus with hyperglycemia: Secondary | ICD-10-CM

## 2024-03-10 MED ORDER — METFORMIN HCL 500 MG PO TABS: 500.0000 mg | ORAL_TABLET | Freq: Two times a day (BID) | ORAL | 3 refills | Status: AC

## 2024-03-10 NOTE — Telephone Encounter (Signed)
 Januvia  was removed from medication list. Dinah please advise if patient to be on metformin  100 BID (was added to allergy list for causing nausea) or a different dose. This medication will need to be added and sent by the provider as it will raise a high risk warning due to being on the allergy/intolerance list

## 2024-03-14 NOTE — Assessment & Plan Note (Signed)
 Patient is experiencing some effects of satiety of tirzepatide  and still has significant sugar cravings despite its usage.  Will refill tirzepatide  at 10 mg and refill sent to pharmacy today.  Discussed again the importance of protein intake as well as total nutrition intake throughout the day so that patient can hit nutritional goals which will keep her macros within the ratio that we have previously discussed.

## 2024-03-14 NOTE — Assessment & Plan Note (Signed)
 Blood pressure well-controlled today.  No medication refills needed at this time.  Continue current treatment plan and current medications.  No chest pain, chest pressure, or headache reported.

## 2024-03-30 ENCOUNTER — Ambulatory Visit: Admitting: Podiatry

## 2024-04-08 ENCOUNTER — Ambulatory Visit (INDEPENDENT_AMBULATORY_CARE_PROVIDER_SITE_OTHER): Admitting: Family Medicine

## 2024-04-26 ENCOUNTER — Ambulatory Visit: Admitting: Radiology

## 2024-05-20 ENCOUNTER — Ambulatory Visit: Admitting: Adult Health
